# Patient Record
Sex: Female | Born: 1970 | Race: Black or African American | Hispanic: No | Smoking: Former smoker
Health system: Southern US, Community
[De-identification: ages and names within clinical notes are randomized; demographics above are authoritative.]

## PROBLEM LIST (undated history)

## (undated) DIAGNOSIS — J45909 Unspecified asthma, uncomplicated: Secondary | ICD-10-CM

## (undated) DIAGNOSIS — G545 Neuralgic amyotrophy: Secondary | ICD-10-CM

## (undated) DIAGNOSIS — S83207A Unspecified tear of unspecified meniscus, current injury, left knee, initial encounter: Secondary | ICD-10-CM

## (undated) DIAGNOSIS — M1990 Unspecified osteoarthritis, unspecified site: Secondary | ICD-10-CM

## (undated) DIAGNOSIS — N6320 Unspecified lump in the left breast, unspecified quadrant: Secondary | ICD-10-CM

## (undated) DIAGNOSIS — Z86718 Personal history of other venous thrombosis and embolism: Secondary | ICD-10-CM

## (undated) DIAGNOSIS — D649 Anemia, unspecified: Secondary | ICD-10-CM

## (undated) DIAGNOSIS — G43909 Migraine, unspecified, not intractable, without status migrainosus: Secondary | ICD-10-CM

## (undated) HISTORY — DX: Neuralgic amyotrophy: G54.5

## (undated) HISTORY — DX: Anemia, unspecified: D64.9

---

## 1999-08-15 HISTORY — PX: GASTRIC BYPASS: SHX52

## 1999-08-15 HISTORY — PX: LAPAROSCOPIC CHOLECYSTECTOMY: SUR755

## 2003-09-14 ENCOUNTER — Emergency Department (HOSPITAL_COMMUNITY): Admission: EM | Admit: 2003-09-14 | Discharge: 2003-09-14 | Payer: Self-pay | Admitting: Emergency Medicine

## 2005-02-28 ENCOUNTER — Emergency Department (HOSPITAL_COMMUNITY): Admission: EM | Admit: 2005-02-28 | Discharge: 2005-02-28 | Payer: Self-pay | Admitting: Emergency Medicine

## 2005-07-23 ENCOUNTER — Emergency Department (HOSPITAL_COMMUNITY): Admission: AD | Admit: 2005-07-23 | Discharge: 2005-07-23 | Payer: Self-pay | Admitting: Family Medicine

## 2005-08-14 HISTORY — PX: KNEE ARTHROSCOPY W/ MENISCECTOMY: SHX1879

## 2005-09-18 ENCOUNTER — Emergency Department (HOSPITAL_COMMUNITY): Admission: EM | Admit: 2005-09-18 | Discharge: 2005-09-18 | Payer: Self-pay | Admitting: Family Medicine

## 2005-10-09 ENCOUNTER — Emergency Department (HOSPITAL_COMMUNITY): Admission: EM | Admit: 2005-10-09 | Discharge: 2005-10-10 | Payer: Self-pay | Admitting: Emergency Medicine

## 2005-11-10 ENCOUNTER — Encounter: Admission: RE | Admit: 2005-11-10 | Discharge: 2005-11-10 | Payer: Self-pay | Admitting: Family Medicine

## 2006-05-11 ENCOUNTER — Emergency Department (HOSPITAL_COMMUNITY): Admission: EM | Admit: 2006-05-11 | Discharge: 2006-05-11 | Payer: Self-pay | Admitting: Family Medicine

## 2006-07-26 ENCOUNTER — Emergency Department (HOSPITAL_COMMUNITY): Admission: EM | Admit: 2006-07-26 | Discharge: 2006-07-26 | Payer: Self-pay | Admitting: Family Medicine

## 2006-08-14 HISTORY — PX: SHOULDER ARTHROSCOPY: SHX128

## 2006-09-03 ENCOUNTER — Encounter: Admission: RE | Admit: 2006-09-03 | Discharge: 2006-09-03 | Payer: Self-pay | Admitting: Occupational Medicine

## 2006-12-07 ENCOUNTER — Ambulatory Visit: Payer: Self-pay | Admitting: Oncology

## 2007-02-04 ENCOUNTER — Ambulatory Visit: Payer: Self-pay | Admitting: Oncology

## 2007-02-06 LAB — CBC WITH DIFFERENTIAL/PLATELET
Basophils Absolute: 0.1 10*3/uL (ref 0.0–0.1)
EOS%: 4.9 % (ref 0.0–7.0)
HGB: 8.8 g/dL — ABNORMAL LOW (ref 11.6–15.9)
MCH: 22.8 pg — ABNORMAL LOW (ref 26.0–34.0)
NEUT#: 2.6 10*3/uL (ref 1.5–6.5)
RBC: 3.85 10*6/uL (ref 3.70–5.32)
RDW: 19.1 % — ABNORMAL HIGH (ref 11.3–14.5)
lymph#: 2 10*3/uL (ref 0.9–3.3)

## 2007-02-06 LAB — MORPHOLOGY

## 2007-02-10 LAB — COMPREHENSIVE METABOLIC PANEL
Albumin: 4 g/dL (ref 3.5–5.2)
BUN: 8 mg/dL (ref 6–23)
CO2: 21 mEq/L (ref 19–32)
Calcium: 9.1 mg/dL (ref 8.4–10.5)
Chloride: 110 mEq/L (ref 96–112)
Glucose, Bld: 82 mg/dL (ref 70–99)
Potassium: 3.9 mEq/L (ref 3.5–5.3)
Total Protein: 7.1 g/dL (ref 6.0–8.3)

## 2007-02-10 LAB — APTT: aPTT: 30 seconds (ref 24–37)

## 2007-02-10 LAB — ANA: Anti Nuclear Antibody(ANA): NEGATIVE

## 2007-02-10 LAB — PROTEIN C ACTIVITY: Protein C Activity: 69 % — ABNORMAL LOW (ref 91–147)

## 2007-02-10 LAB — LACTATE DEHYDROGENASE: LDH: 193 U/L (ref 94–250)

## 2007-02-10 LAB — PROTEIN S, ANTIGEN, FREE: Protein S Ag, Free: 53 % normal (ref 50–147)

## 2007-02-10 LAB — PROTEIN C, TOTAL: Protein C, Total: 77 % (ref 70–140)

## 2007-02-10 LAB — PROTHROMBIN TIME: Prothrombin Time: 13.5 seconds (ref 11.6–15.2)

## 2007-02-27 ENCOUNTER — Emergency Department (HOSPITAL_COMMUNITY): Admission: EM | Admit: 2007-02-27 | Discharge: 2007-02-27 | Payer: Self-pay | Admitting: Emergency Medicine

## 2007-05-15 ENCOUNTER — Ambulatory Visit: Payer: Self-pay | Admitting: Oncology

## 2007-11-28 ENCOUNTER — Emergency Department (HOSPITAL_COMMUNITY): Admission: EM | Admit: 2007-11-28 | Discharge: 2007-11-28 | Payer: Self-pay | Admitting: Emergency Medicine

## 2013-02-24 ENCOUNTER — Encounter (HOSPITAL_COMMUNITY): Payer: Self-pay | Admitting: Emergency Medicine

## 2013-02-24 ENCOUNTER — Emergency Department (HOSPITAL_COMMUNITY)
Admission: EM | Admit: 2013-02-24 | Discharge: 2013-02-24 | Disposition: A | Discharge status: Home / Self Care | Payer: Medicaid - Out of State | Attending: Emergency Medicine | Admitting: Emergency Medicine

## 2013-02-24 ENCOUNTER — Emergency Department (HOSPITAL_COMMUNITY)
Admission: EM | Admit: 2013-02-24 | Discharge: 2013-02-24 | Disposition: A | Discharge status: Home / Self Care | Payer: Self-pay | Source: Home / Self Care

## 2013-02-24 ENCOUNTER — Ambulatory Visit (HOSPITAL_COMMUNITY)
Admit: 2013-02-24 | Discharge: 2013-02-24 | Disposition: A | Discharge status: Home / Self Care | Payer: Medicaid - Out of State | Attending: Emergency Medicine | Admitting: Emergency Medicine

## 2013-02-24 DIAGNOSIS — J45909 Unspecified asthma, uncomplicated: Secondary | ICD-10-CM | POA: Insufficient documentation

## 2013-02-24 DIAGNOSIS — M79609 Pain in unspecified limb: Secondary | ICD-10-CM

## 2013-02-24 DIAGNOSIS — M79662 Pain in left lower leg: Secondary | ICD-10-CM

## 2013-02-24 DIAGNOSIS — Z8679 Personal history of other diseases of the circulatory system: Secondary | ICD-10-CM | POA: Insufficient documentation

## 2013-02-24 DIAGNOSIS — R252 Cramp and spasm: Secondary | ICD-10-CM | POA: Insufficient documentation

## 2013-02-24 HISTORY — DX: Migraine, unspecified, not intractable, without status migrainosus: G43.909

## 2013-02-24 LAB — POCT I-STAT, CHEM 8
BUN: 13 mg/dL (ref 6–23)
Chloride: 108 mEq/L (ref 96–112)
HCT: 35 % — ABNORMAL LOW (ref 36.0–46.0)
Potassium: 3.9 mEq/L (ref 3.5–5.1)
Sodium: 141 mEq/L (ref 135–145)

## 2013-02-24 LAB — CBC
Platelets: 215 10*3/uL (ref 150–400)
RBC: 4.11 MIL/uL (ref 3.87–5.11)
RDW: 13.9 % (ref 11.5–15.5)
WBC: 6.2 10*3/uL (ref 4.0–10.5)

## 2013-02-24 MED ORDER — HYDROCODONE-ACETAMINOPHEN 5-325 MG PO TABS
2.0000 | ORAL_TABLET | Freq: Once | ORAL | Status: AC
  Administered 2013-02-24: 2 via ORAL
  Filled 2013-02-24: qty 2

## 2013-02-24 MED ORDER — ENOXAPARIN SODIUM 100 MG/ML ~~LOC~~ SOLN
1.0000 mg/kg | Freq: Once | SUBCUTANEOUS | Status: AC
Start: 2013-02-24 — End: 2013-02-24
  Administered 2013-02-24: 95 mg via SUBCUTANEOUS
  Filled 2013-02-24: qty 1

## 2013-02-24 NOTE — ED Notes (Signed)
Per Pt: Patient c/o of moderate severe cramps in her LLE. States cramps have been occurring intermittently since Friday and have been increasing in severity. Ax4, NAD. Pt is able to ambulate.

## 2013-02-24 NOTE — ED Provider Notes (Signed)
History    CSN: [id number] Arrival date & time 02/24/13  0516  None    Chief Complaint  Patient presents with  . Leg Pain   (Consider location/radiation/quality/duration/timing/severity/associated sxs/prior Treatment) HPI History provided by patient. Left calf cramping ongoing for the last 3 days. No trauma. She has noticed some mild swelling. She denies history of same. She has a history of asthma but denies any recent use of albuterol. No other medications. No fevers. No rash. No known aggravating or alleviating factors. Symptoms moderate in severity. No chest pain or shortness of breath.  Past Medical History  Diagnosis Date  . Asthma   . Migraines    History reviewed. No pertinent past surgical history. No family history on file. History  Substance Use Topics  . Smoking status: Not on file  . Smokeless tobacco: Not on file  . Alcohol Use: Not on file   OB History   Grav Para Term Preterm Abortions TAB SAB Ect Mult Living                 Review of Systems  Constitutional: Negative for fever and chills.  HENT: Negative for neck pain.   Eyes: Negative for visual disturbance.  Respiratory: Negative for shortness of breath.   Cardiovascular: Negative for chest pain.  Gastrointestinal: Negative for abdominal pain.  Genitourinary: Negative for flank pain.  Musculoskeletal: Negative for back pain.  Skin: Negative for rash.  Neurological: Negative for headaches.  All other systems reviewed and are negative.    Allergies  Review of patient's allergies indicates no known allergies.  Home Medications  No current outpatient prescriptions on file. BP 110/76  Pulse 66  Temp(Src) 98.3 F (36.8 C) (Oral)  Resp 18  Ht 5\' 4"  (1.626 m)  Wt 205 lb (92.987 kg)  BMI 35.17 kg/m2  SpO2 100% Physical Exam  Constitutional: She is oriented to person, place, and time. She appears well-developed and well-nourished.  HENT:  Head: Normocephalic and atraumatic.  Eyes: EOM are  normal. Pupils are equal, round, and reactive to light.  Neck: Neck supple.  Cardiovascular: Normal rate, regular rhythm and intact distal pulses.   Pulmonary/Chest: Effort normal and breath sounds normal. No respiratory distress.  Musculoskeletal: Normal range of motion.  Tender over left calf with mild edema. Distal neurovascular intact. No cords. Negative Homans sign.no right lower extremity tenderness or swelling  Neurological: She is alert and oriented to person, place, and time.  Skin: Skin is warm and dry.    ED Course  Procedures (including critical care time)  Results for orders placed during the hospital encounter of 02/24/13  CBC      Result Value Range   WBC 6.2  4.0 - 10.5 K/uL   RBC 4.11  3.87 - 5.11 MIL/uL   Hemoglobin 11.1 (*) 12.0 - 15.0 g/dL   HCT 40.9 (*) 81.1 - 91.4 %   MCV 80.0  78.0 - 100.0 fL   MCH 27.0  26.0 - 34.0 pg   MCHC 33.7  30.0 - 36.0 g/dL   RDW 78.2  95.6 - 21.3 %   Platelets 215  150 - 400 K/uL  POCT I-STAT, CHEM 8      Result Value Range   Sodium 141  135 - 145 mEq/L   Potassium 3.9  3.5 - 5.1 mEq/L   Chloride 108  96 - 112 mEq/L   BUN 13  6 - 23 mg/dL   Creatinine, Ser 0.86 (*) 0.50 - 1.10 mg/dL  Glucose, Bld 114 (*) 70 - 99 mg/dL   Calcium, Ion 1.61  0.96 - 1.23 mmol/L   TCO2 19  0 - 100 mmol/L   Hemoglobin 11.9 (*) 12.0 - 15.0 g/dL   HCT 04.5 (*) 40.9 - 81.1 %    vicodin PO  Plan Lovenox and discharge home with followup ultrasound in vascular lab. Return precautions provided and verbalizes understood. Outpatient referral provided. Stable for discharge home at this time in improved condition   MDM  Left leg cramps - no trauma and no PE symptoms Labs reviewed as above potassium 3.9 and creatinine 1.2 Medications provided Vital signs and nursing notes reviewed and considered  Sunnie Nielsen, MD 02/24/13 [phone]

## 2013-02-24 NOTE — Progress Notes (Signed)
*  PRELIMINARY RESULTS* Vascular Ultrasound Left lower extremity venous duplex has been completed.  Preliminary findings: Left = negative for DVT.    Farrel Demark, RDMS, RVT  02/24/2013, 9:15 AM

## 2013-03-15 ENCOUNTER — Emergency Department (HOSPITAL_COMMUNITY): Payer: Medicaid - Out of State

## 2013-03-15 ENCOUNTER — Encounter (HOSPITAL_COMMUNITY): Payer: Self-pay | Admitting: Family Medicine

## 2013-03-15 ENCOUNTER — Emergency Department (HOSPITAL_COMMUNITY)
Admission: EM | Admit: 2013-03-15 | Discharge: 2013-03-15 | Disposition: A | Discharge status: Home / Self Care | Payer: Medicaid - Out of State | Attending: Emergency Medicine | Admitting: Emergency Medicine

## 2013-03-15 DIAGNOSIS — G43909 Migraine, unspecified, not intractable, without status migrainosus: Secondary | ICD-10-CM | POA: Insufficient documentation

## 2013-03-15 DIAGNOSIS — M791 Myalgia, unspecified site: Secondary | ICD-10-CM

## 2013-03-15 DIAGNOSIS — M25512 Pain in left shoulder: Secondary | ICD-10-CM

## 2013-03-15 DIAGNOSIS — Z8739 Personal history of other diseases of the musculoskeletal system and connective tissue: Secondary | ICD-10-CM | POA: Insufficient documentation

## 2013-03-15 DIAGNOSIS — M25519 Pain in unspecified shoulder: Secondary | ICD-10-CM | POA: Insufficient documentation

## 2013-03-15 DIAGNOSIS — Z79899 Other long term (current) drug therapy: Secondary | ICD-10-CM | POA: Insufficient documentation

## 2013-03-15 DIAGNOSIS — IMO0001 Reserved for inherently not codable concepts without codable children: Secondary | ICD-10-CM | POA: Insufficient documentation

## 2013-03-15 DIAGNOSIS — R259 Unspecified abnormal involuntary movements: Secondary | ICD-10-CM | POA: Insufficient documentation

## 2013-03-15 DIAGNOSIS — J45909 Unspecified asthma, uncomplicated: Secondary | ICD-10-CM | POA: Insufficient documentation

## 2013-03-15 DIAGNOSIS — F17200 Nicotine dependence, unspecified, uncomplicated: Secondary | ICD-10-CM | POA: Insufficient documentation

## 2013-03-15 MED ORDER — HYDROCODONE-ACETAMINOPHEN 5-325 MG PO TABS: 1.0000 | ORAL_TABLET | Freq: Four times a day (QID) | ORAL | Status: DC | PRN

## 2013-03-15 MED ORDER — CYCLOBENZAPRINE HCL 10 MG PO TABS: 10.0000 mg | ORAL_TABLET | Freq: Two times a day (BID) | ORAL | Status: DC | PRN

## 2013-03-15 NOTE — ED Notes (Signed)
Pt c/o constant, sharp, intense left arm pain x29mo with new onset of spasms x1day. Pt states she has tried multiple pain relievers throughout the past month with no relief.

## 2013-03-15 NOTE — ED Notes (Signed)
Pt returned from X-ray.  

## 2013-03-15 NOTE — ED Provider Notes (Signed)
CSN: [id number]     Arrival date & time 03/15/13  0220 History     First MD Initiated Contact with Patient 03/15/13 0244     Chief Complaint  Patient presents with  . Arm Pain   HPI Yesenia Wheeler is a 42 y.o. female presenting with left shoulder and upper arm pain x1 month. She's had associated spasms x1 day, she's tried multiple over-the-counter pain relievers without relief, she has not slept very well this evening. Patient's pain is severe, nonradiating, sharp, associated with occasional spasms. She denies, denies any numbness or tingling of the left hand, no recent trauma.   Past Medical History  Diagnosis Date  . Asthma   . Migraines   . Bone spur 2008   Past Surgical History  Procedure Laterality Date  . Shoulder surgery Left 2008    Bone Spur removal   No family history on file. History  Substance Use Topics  . Smoking status: Current Some Day Smoker  . Smokeless tobacco: Never Used  . Alcohol Use: No   OB History   Grav Para Term Preterm Abortions TAB SAB Ect Mult Living   13    13  13   3      Review of Systems At least 10pt or greater review of systems completed and are negative except where specified in the HPI.  Allergies  Review of patient's allergies indicates no known allergies.  Home Medications   Current Outpatient Rx  Name  Route  Sig  Dispense  Refill  . SUMAtriptan Succinate (IMITREX PO)   Oral   Take 1 tablet by mouth daily as needed (migraine).         . TOPIRAMATE PO   Oral   Take 1 tablet by mouth daily.         . cyclobenzaprine (FLEXERIL) 10 MG tablet   Oral   Take 1 tablet (10 mg total) by mouth 2 (two) times daily as needed for muscle spasms.   20 tablet   0   . HYDROcodone-acetaminophen (NORCO/VICODIN) 5-325 MG per tablet   Oral   Take 1-2 tablets by mouth every 6 (six) hours as needed for pain.   17 tablet   0    BP 118/75  Pulse 65  Temp(Src) 97.3 F (36.3 C) (Oral)  Resp 16  SpO2 100%  LMP  03/02/2013 Physical Exam  Nursing notes reviewed.  Electronic medical record reviewed. VITAL SIGNS:   Filed Vitals:   03/15/13 0241 03/15/13 0406  BP: 101/68 118/75  Pulse: 69 65  Temp: 97.3 F (36.3 C)   TempSrc: Oral   Resp: 18 16  SpO2: 100% 100%   CONSTITUTIONAL: Awake, oriented, appears non-toxic HENT: Atraumatic, normocephalic, oral mucosa pink and moist, airway patent. Nares patent without drainage. External ears normal. EYES: Conjunctiva clear, EOMI, PERRLA NECK: Trachea midline, non-tender, supple CARDIOVASCULAR: Normal heart rate, Normal rhythm, No murmurs, rubs, gallops PULMONARY/CHEST: Clear to auscultation, no rhonchi, wheezes, or rales. Symmetrical breath sounds. Non-tender. ABDOMINAL: Non-distended, soft, non-tender - no rebound or guarding.  BS normal. BACK: No step-offs or deformities, nontender to palpation in the midline, no skin abnormalities. Patient does have some tenderness when the muscle bellies of the trapezius and deltoid are palpated. NEUROLOGIC: Non-focal, moving all four extremities, no gross sensory or motor deficits. EXTREMITIES: No clubbing, cyanosis, or edema SKIN: Warm, Dry, No erythema, No rash  ED Course   Procedures (including critical care time)  Labs Reviewed - No data to display Dg Elbow  Complete Left  03/15/2013   *RADIOLOGY REPORT*  Clinical Data: Prior pain  LEFT ELBOW - COMPLETE 3+ VIEW  Comparison: None.  Findings: There is no acute fracture dislocation.  No joint effusion.  Minimal degenerative spurring is present at the olecranon.  No soft tissue abnormality.  Osseous mineralization is normal.  IMPRESSION: Prior minimal degenerative spurring about the olecranon.  No acute fracture or dislocation.   Original Report Authenticated By: Rise Mu, M.D.   Dg Shoulder Left  03/15/2013   *RADIOLOGY REPORT*  Clinical Data: Left shoulder pain  LEFT SHOULDER - 2+ VIEW  Comparison: None.  Findings: There is no acute fracture or  dislocation.  The humeral head articulates normally with the glenoid.  AC joint is grossly approximated.  Osseous mineralization is normal.  No soft tissue abnormality.  Partially visualized left hemithorax is grossly unremarkable.  IMPRESSION: Normal radiograph of the left shoulder.   Original Report Authenticated By: Rise Mu, M.D.   1. Muscle pain   2. Shoulder pain, left     MDM  Triage x-rays were unremarkable. Patient has muscular pain, doubt radiculopathy given clinical history and exam. Shoulders not red, swollen, do not suspect a joint infection, necrotizing fasciitis orthopedic emergency at this time. Refer the patient to sports medicine, given pain medicine and discharged home stable and good condition.   Jones Skene, MD 03/15/13 [phone]

## 2013-05-23 ENCOUNTER — Emergency Department (HOSPITAL_COMMUNITY)
Admission: EM | Admit: 2013-05-23 | Discharge: 2013-05-23 | Disposition: A | Discharge status: Home / Self Care | Payer: Medicaid - Out of State | Attending: Emergency Medicine | Admitting: Emergency Medicine

## 2013-05-23 ENCOUNTER — Emergency Department (HOSPITAL_COMMUNITY): Payer: Medicaid - Out of State

## 2013-05-23 ENCOUNTER — Encounter (HOSPITAL_COMMUNITY): Payer: Self-pay | Admitting: Emergency Medicine

## 2013-05-23 DIAGNOSIS — F17200 Nicotine dependence, unspecified, uncomplicated: Secondary | ICD-10-CM | POA: Insufficient documentation

## 2013-05-23 DIAGNOSIS — R072 Precordial pain: Secondary | ICD-10-CM | POA: Insufficient documentation

## 2013-05-23 DIAGNOSIS — Z79899 Other long term (current) drug therapy: Secondary | ICD-10-CM | POA: Insufficient documentation

## 2013-05-23 DIAGNOSIS — Z8739 Personal history of other diseases of the musculoskeletal system and connective tissue: Secondary | ICD-10-CM | POA: Insufficient documentation

## 2013-05-23 DIAGNOSIS — G43909 Migraine, unspecified, not intractable, without status migrainosus: Secondary | ICD-10-CM | POA: Insufficient documentation

## 2013-05-23 DIAGNOSIS — R112 Nausea with vomiting, unspecified: Secondary | ICD-10-CM | POA: Insufficient documentation

## 2013-05-23 DIAGNOSIS — J45909 Unspecified asthma, uncomplicated: Secondary | ICD-10-CM | POA: Insufficient documentation

## 2013-05-23 DIAGNOSIS — E669 Obesity, unspecified: Secondary | ICD-10-CM | POA: Insufficient documentation

## 2013-05-23 DIAGNOSIS — Z3202 Encounter for pregnancy test, result negative: Secondary | ICD-10-CM | POA: Insufficient documentation

## 2013-05-23 DIAGNOSIS — R079 Chest pain, unspecified: Secondary | ICD-10-CM

## 2013-05-23 LAB — BASIC METABOLIC PANEL
BUN: 8 mg/dL (ref 6–23)
CO2: 24 mEq/L (ref 19–32)
Chloride: 106 mEq/L (ref 96–112)
GFR calc Af Amer: 90 mL/min — ABNORMAL LOW (ref >90)
Glucose, Bld: 72 mg/dL (ref 70–99)
Potassium: 3.4 mEq/L — ABNORMAL LOW (ref 3.5–5.1)

## 2013-05-23 LAB — CBC
HCT: 32.1 % — ABNORMAL LOW (ref 36.0–46.0)
Hemoglobin: 10.4 g/dL — ABNORMAL LOW (ref 12.0–15.0)
MCV: 82.5 fL (ref 78.0–100.0)
RBC: 3.89 MIL/uL (ref 3.87–5.11)
RDW: 14.5 % (ref 11.5–15.5)
WBC: 5.4 10*3/uL (ref 4.0–10.5)

## 2013-05-23 MED ORDER — IOHEXOL 350 MG/ML SOLN
100.0000 mL | Freq: Once | INTRAVENOUS | Status: AC | PRN
  Administered 2013-05-23: 100 mL via INTRAVENOUS

## 2013-05-23 MED ORDER — KETOROLAC TROMETHAMINE 30 MG/ML IJ SOLN
30.0000 mg | Freq: Once | INTRAMUSCULAR | Status: AC
  Administered 2013-05-23: 30 mg via INTRAVENOUS
  Filled 2013-05-23: qty 1

## 2013-05-23 MED ORDER — TRAMADOL HCL 50 MG PO TABS: 50.0000 mg | ORAL_TABLET | Freq: Four times a day (QID) | ORAL | Status: DC | PRN

## 2013-05-23 NOTE — ED Notes (Signed)
PA at bedside attempting IV access.  

## 2013-05-23 NOTE — ED Notes (Signed)
PA not able to obtain access.

## 2013-05-23 NOTE — ED Notes (Signed)
Charge unable to get IV via [place]. IV team paged.

## 2013-05-23 NOTE — Progress Notes (Signed)
[facility] provided pt with a list of primary care resources and a [facility] [place] [facility] application.  °

## 2013-05-23 NOTE — ED Notes (Signed)
Xray tech tried to take pt for chest xray, pt states to her that she hasnt had menstrual cycle in long time and would like a pregnancy test before she goes to radiology. Made [facility] EDP aware.

## 2013-05-23 NOTE — ED Notes (Signed)
Patient c/o pain at IV site.  Flushed IV and patient grimacing and saying it hurts.  MD notified that Toradol unable to be given IV.

## 2013-05-23 NOTE — ED Provider Notes (Signed)
CSN: [id number]     Arrival date & time 05/23/13  1213 History   First MD Initiated Contact with Patient 05/23/13 1234     Chief Complaint  Patient presents with  . Chest Pain  . Emesis   (Consider location/radiation/quality/duration/timing/severity/associated sxs/prior Treatment) Patient is a 42 y.o. female presenting with chest pain.  Chest Pain Pain location:  Substernal area Pain quality: sharp   Pain radiates to:  Does not radiate Pain severity:  Severe Onset quality:  Gradual Duration:  1 week Timing:  Constant Progression:  Unchanged Chronicity:  New Relieved by:  Nothing Worsened by:  Deep breathing and movement (palpation) Ineffective treatments:  None tried Associated symptoms: nausea   Associated symptoms: no abdominal pain, no cough, no diaphoresis, no dizziness, no fever, no shortness of breath and not vomiting     Past Medical History  Diagnosis Date  . Asthma   . Migraines   . Bone spur 2008   Past Surgical History  Procedure Laterality Date  . Shoulder surgery Left 2008    Bone Spur removal   No family history on file. History  Substance Use Topics  . Smoking status: Current Some Day Smoker  . Smokeless tobacco: Never Used  . Alcohol Use: No   OB History   Grav Para Term Preterm Abortions TAB SAB Ect Mult Living   13    13  13   3      Review of Systems  Constitutional: Negative for fever and diaphoresis.  HENT: Negative for congestion.   Respiratory: Negative for cough and shortness of breath.   Cardiovascular: Positive for chest pain.  Gastrointestinal: Positive for nausea. Negative for vomiting, abdominal pain and diarrhea.  Neurological: Negative for dizziness.  All other systems reviewed and are negative.    Allergies  Review of patient's allergies indicates no known allergies.  Home Medications   Current Outpatient Rx  Name  Route  Sig  Dispense  Refill  . albuterol (PROVENTIL HFA;VENTOLIN HFA) 108 (90 BASE) MCG/ACT inhaler  Inhalation   Inhale 2 puffs into the lungs every 6 (six) hours as needed for wheezing.         . SUMAtriptan (IMITREX) 100 MG tablet   Oral   Take 100 mg by mouth every 2 (two) hours as needed for migraine (maximum 2 tablets peer 24 hours). May repeat in 2 hours if headache persists or recurs.         . topiramate (TOPAMAX) 25 MG tablet   Oral   Take 25 mg by mouth 2 (two) times daily.          BP 108/60  Pulse 71  Temp(Src) 99.1 F (37.3 C) (Oral)  Resp 21  SpO2 100%  LMP 04/18/2013 Physical Exam  Nursing note and vitals reviewed. Constitutional: She is oriented to person, place, and time. She appears well-developed and well-nourished. No distress.  obese  HENT:  Head: Normocephalic and atraumatic.  Mouth/Throat: Oropharynx is clear and moist.  Eyes: Conjunctivae are normal. Pupils are equal, round, and reactive to light. No scleral icterus.  Neck: Neck supple.  Cardiovascular: Normal rate, regular rhythm, normal heart sounds and intact distal pulses.   No murmur heard. Pulmonary/Chest: Effort normal and breath sounds normal. No stridor. No respiratory distress. She has no rales. She exhibits tenderness.    Abdominal: Soft. Bowel sounds are normal. She exhibits no distension. There is no tenderness.  Musculoskeletal: Normal range of motion.  Neurological: She is alert and oriented to person, place,  and time.  Skin: Skin is warm and dry. No rash noted.  Psychiatric: She has a normal mood and affect. Her behavior is normal.    ED Course  Procedures (including critical care time) Labs Review Labs Reviewed  CBC - Abnormal; Notable for the following:    Hemoglobin 10.4 (*)    HCT 32.1 (*)    All other components within normal limits  BASIC METABOLIC PANEL - Abnormal; Notable for the following:    Potassium 3.4 (*)    GFR calc non Af Amer 77 (*)    GFR calc Af Amer 90 (*)    All other components within normal limits  POCT I-STAT TROPONIN I   Imaging Review No  results found.  EKG Interpretation   None     EKG - NSR, rate75, normal axis, normal intervals, no ST/T changes, no priors available.    MDM   1. Chest pain    42 yo female with chest pain for 1 week. Sternal and sharp.  Worse with movement.  Reproduced with palpation.  No dizziness, SOB, or diaphoresis.  EKG normal.  Story inconsistent with ACS and she has had persistent pain for a week with negative troponin.  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  D Dimer elevated.  CTA ordered.  Toradol ordered for pain.  Dr. Effie Shy will follow up result of CTA.  Anticipate dc home with [facility] chest pain treatment if negative.      Candyce Churn, MD 05/23/13 1754

## 2013-05-23 NOTE — ED Notes (Signed)
Dr. Effie Shy notified that patient does not have IV access.  IV team unable to establish line.

## 2013-05-23 NOTE — ED Notes (Signed)
Pt presents with centralized chest pain that began one week ago. Pt reports a history of chest pain, which was evaluated as a chest wall strain. Pt also reports dizziness, nausea, emesis, weakness, and metallic taste in her mouth. Pt is A/O x4, talking in full sentences, oxygen saturation of 100% on room air.

## 2013-05-23 NOTE — ED Notes (Signed)
[place] attempted to start line using ultrasound.  Unsuccessful attempt. IV team notified.

## 2013-05-23 NOTE — ED Notes (Signed)
Attempted IV start x 2.  Not successful.

## 2013-05-23 NOTE — ED Notes (Signed)
Patient transported to X-ray 

## 2013-05-23 NOTE — ED Provider Notes (Signed)
Patient received in checkout from Dr. Loretha Stapler. Delay in character. Pain. IV access. Prophylaxis was ultimately obtained by nursing. CT chest done, and is negative for acute abnormality including infection, and PE. Repeat vital signs are normal.  Dg Chest 2 View  05/23/2013   CLINICAL DATA:  Chest pain.  EXAM: CHEST  2 VIEW  COMPARISON:  September 05, 2006.  FINDINGS: The heart size and mediastinal contours are within normal limits. Both lungs are clear. The visualized skeletal structures are unremarkable.  IMPRESSION: No active cardiopulmonary disease.   Electronically Signed   By: Roque Lias M.D.   On: 05/23/2013 19:03   Ct Angio Chest Pe W/cm &/or Wo Cm  05/23/2013   CLINICAL DATA:  Chest pain and shortness of breath. Elevated D-dimer.  EXAM: CT ANGIOGRAPHY CHEST WITH CONTRAST  TECHNIQUE: Multidetector CT imaging of the chest was performed using the standard protocol during bolus administration of intravenous contrast. Multiplanar CT image reconstructions including MIPs were obtained to evaluate the vascular anatomy.  CONTRAST:  OMNIPAQUE IOHEXOL 350 MG/ML SOLN  COMPARISON:  Chest radiographs obtained earlier today. Chest CTA report dated 02/14/2010.  FINDINGS: Normally opacified pulmonary arteries. No pulmonary arterial filling defects. Clear lungs. No lung masses or enlarged lymph nodes. Mild thoracic spine degenerative changes. Surgical clips and staples in the upper abdomen with an appearance suggesting previous gastric bypass.  Review of the MIP images confirms the above findings.  IMPRESSION: No pulmonary emboli or acute abnormality.   Electronically Signed   By: Gordan Payment M.D.   On: 05/23/2013 19:39   Medications  iohexol (OMNIPAQUE) 350 MG/ML injection 100 mL (100 mLs Intravenous Contrast Given 05/23/13 1913)  ketorolac (TORADOL) 30 MG/ML injection 30 mg (30 mg Intravenous Given 05/23/13 1641)    Patient Vitals for the past 24 hrs:  BP Temp Temp src Pulse Resp SpO2  05/23/13 1730  104/70 mmHg - - 72 16 100 %  05/23/13 1712 116/66 mmHg 98.7 F (37.1 C) Oral 66 15 100 %  05/23/13 1630 128/78 mmHg - - 57 16 100 %  05/23/13 1616 108/69 mmHg - - 60 20 100 %  05/23/13 1400 - - - 59 16 100 %  05/23/13 1300 - - - 66 18 100 %  05/23/13 1220 108/60 mmHg 99.1 F (37.3 C) Oral 71 21 100 %    8:15 PM Reevaluation with update and discussion. After initial assessment and treatment, an updated evaluation reveals she is comfortable. She would like some pain medicine to use at home. She does not have a PCP. [name] L    Nursing Notes Reviewed/ Care Coordinated Applicable Imaging Reviewed Interpretation of Laboratory Data incorporated into ED treatment  Plan: Home Medications- Tramadol; Home Treatments- rest; return here if the recommended treatment, does not improve the symptoms; Recommended follow up- PCP of choice prn  Flint Melter, MD 05/23/13 2019

## 2013-05-26 LAB — POCT PREGNANCY, URINE: Preg Test, Ur: NEGATIVE

## 2013-09-08 ENCOUNTER — Emergency Department (HOSPITAL_COMMUNITY)
Admission: EM | Admit: 2013-09-08 | Discharge: 2013-09-08 | Discharge status: Against Medical Advice | Payer: Medicaid - Out of State | Attending: Emergency Medicine | Admitting: Emergency Medicine

## 2013-09-08 ENCOUNTER — Encounter (HOSPITAL_COMMUNITY): Payer: Self-pay | Admitting: Emergency Medicine

## 2013-09-08 DIAGNOSIS — F17200 Nicotine dependence, unspecified, uncomplicated: Secondary | ICD-10-CM | POA: Insufficient documentation

## 2013-09-08 DIAGNOSIS — J45909 Unspecified asthma, uncomplicated: Secondary | ICD-10-CM | POA: Insufficient documentation

## 2013-09-08 DIAGNOSIS — G43909 Migraine, unspecified, not intractable, without status migrainosus: Secondary | ICD-10-CM | POA: Insufficient documentation

## 2013-09-08 LAB — CBC
HCT: 32.9 % — ABNORMAL LOW (ref 36.0–46.0)
Hemoglobin: 11.1 g/dL — ABNORMAL LOW (ref 12.0–15.0)
MCH: 27.1 pg (ref 26.0–34.0)
MCHC: 33.7 g/dL (ref 30.0–36.0)
MCV: 80.4 fL (ref 78.0–100.0)
Platelets: 214 10*3/uL (ref 150–400)
RBC: 4.09 MIL/uL (ref 3.87–5.11)
RDW: 15.1 % (ref 11.5–15.5)
WBC: 6.6 10*3/uL (ref 4.0–10.5)

## 2013-09-08 LAB — POCT I-STAT, CHEM 8
BUN: 15 mg/dL (ref 6–23)
CREATININE: 1.1 mg/dL (ref 0.50–1.10)
Calcium, Ion: 1.18 mmol/L (ref 1.12–1.23)
Chloride: 107 mEq/L (ref 96–112)
GLUCOSE: 80 mg/dL (ref 70–99)
HCT: 36 % (ref 36.0–46.0)
Hemoglobin: 12.2 g/dL (ref 12.0–15.0)
Potassium: 4.2 mEq/L (ref 3.7–5.3)
SODIUM: 141 meq/L (ref 137–147)
TCO2: 22 mmol/L (ref 0–100)

## 2013-09-08 NOTE — ED Notes (Addendum)
Patient with migraine for last three weeks.  Patient denies any nausea or vomiting with the migraine.  Patient states that she does have some photophobia with it.  Patient has not had her period since beginning of December.

## 2013-09-08 NOTE — ED Notes (Signed)
Pt requesting to be discharged from system.  Stating "I cannot wait any longer".  This RN attempted to get pt to stay but pt not wanting to due to wait time.

## 2013-09-09 ENCOUNTER — Encounter (HOSPITAL_COMMUNITY): Payer: Self-pay | Admitting: Emergency Medicine

## 2013-09-09 ENCOUNTER — Emergency Department (HOSPITAL_COMMUNITY)
Admission: EM | Admit: 2013-09-09 | Discharge: 2013-09-09 | Disposition: A | Discharge status: Home / Self Care | Payer: Medicaid - Out of State | Attending: Emergency Medicine | Admitting: Emergency Medicine

## 2013-09-09 DIAGNOSIS — J45909 Unspecified asthma, uncomplicated: Secondary | ICD-10-CM | POA: Insufficient documentation

## 2013-09-09 DIAGNOSIS — G43909 Migraine, unspecified, not intractable, without status migrainosus: Secondary | ICD-10-CM | POA: Insufficient documentation

## 2013-09-09 DIAGNOSIS — Z79899 Other long term (current) drug therapy: Secondary | ICD-10-CM | POA: Insufficient documentation

## 2013-09-09 DIAGNOSIS — Z3202 Encounter for pregnancy test, result negative: Secondary | ICD-10-CM | POA: Insufficient documentation

## 2013-09-09 DIAGNOSIS — F17200 Nicotine dependence, unspecified, uncomplicated: Secondary | ICD-10-CM | POA: Insufficient documentation

## 2013-09-09 DIAGNOSIS — M542 Cervicalgia: Secondary | ICD-10-CM | POA: Insufficient documentation

## 2013-09-09 LAB — POCT PREGNANCY, URINE: Preg Test, Ur: NEGATIVE

## 2013-09-09 MED ORDER — PROCHLORPERAZINE EDISYLATE 5 MG/ML IJ SOLN: 10.0000 mg | Freq: Four times a day (QID) | INTRAMUSCULAR | Status: DC | PRN

## 2013-09-09 MED ORDER — PROCHLORPERAZINE EDISYLATE 5 MG/ML IJ SOLN
10.0000 mg | Freq: Once | INTRAMUSCULAR | Status: AC
  Administered 2013-09-09: 10 mg via INTRAVENOUS
  Filled 2013-09-09: qty 2

## 2013-09-09 MED ORDER — DEXAMETHASONE SODIUM PHOSPHATE 10 MG/ML IJ SOLN
10.0000 mg | Freq: Once | INTRAMUSCULAR | Status: AC
  Administered 2013-09-09: 10 mg via INTRAVENOUS
  Filled 2013-09-09: qty 1

## 2013-09-09 MED ORDER — DIPHENHYDRAMINE HCL 50 MG/ML IJ SOLN
50.0000 mg | Freq: Once | INTRAMUSCULAR | Status: AC
  Administered 2013-09-09: 50 mg via INTRAVENOUS
  Filled 2013-09-09: qty 1

## 2013-09-09 MED ORDER — SODIUM CHLORIDE 0.9 % IV BOLUS (SEPSIS)
1000.0000 mL | Freq: Once | INTRAVENOUS | Status: AC
  Administered 2013-09-09: 1000 mL via INTRAVENOUS

## 2013-09-09 NOTE — ED Notes (Signed)
Pt complains of migraine with vision issues for three weeks, pt is seveal weeks late for menstrual cycle, pt here last night and left [facility] due to wait times.

## 2013-09-09 NOTE — Discharge Instructions (Signed)
Migraine Headache A migraine headache is an intense, throbbing pain on one or both sides of your head. A migraine can last for 30 minutes to several hours. CAUSES  The exact cause of a migraine headache is not always known. However, a migraine may be caused when nerves in the brain become irritated and release chemicals that cause inflammation. This causes pain. Certain things may also trigger migraines, such as:  Alcohol.  Smoking.  Stress.  Menstruation.  Aged cheeses.  Foods or drinks that contain nitrates, glutamate, aspartame, or tyramine.  Lack of sleep.  Chocolate.  Caffeine.  Hunger.  Physical exertion.  Fatigue.  Medicines used to treat chest pain (nitroglycerine), birth control pills, estrogen, and some blood pressure medicines. SIGNS AND SYMPTOMS  Pain on one or both sides of your head.  Pulsating or throbbing pain.  Severe pain that prevents daily activities.  Pain that is aggravated by any physical activity.  Nausea, vomiting, or both.  Dizziness.  Pain with exposure to bright lights, loud noises, or activity.  General sensitivity to bright lights, loud noises, or smells. Before you get a migraine, you may get warning signs that a migraine is coming (aura). An aura may include:  Seeing flashing lights.  Seeing bright spots, halos, or zig-zag lines.  Having tunnel vision or blurred vision.  Having feelings of numbness or tingling.  Having trouble talking.  Having muscle weakness. DIAGNOSIS  A migraine headache is often diagnosed based on:  Symptoms.  Physical exam.  A CT scan or MRI of your head. These imaging tests cannot diagnose migraines, but they can help rule out other causes of headaches. TREATMENT Medicines may be given for pain and nausea. Medicines can also be given to help prevent recurrent migraines.  HOME CARE INSTRUCTIONS  Only take over-the-counter or prescription medicines for pain or discomfort as directed by your  health care provider. The use of long-term narcotics is not recommended.  Lie down in a dark, quiet room when you have a migraine.  Keep a journal to find out what may trigger your migraine headaches. For example, write down:  What you eat and drink.  How much sleep you get.  Any change to your diet or medicines.  Limit alcohol consumption.  Quit smoking if you smoke.  Get 7 9 hours of sleep, or as recommended by your health care provider.  Limit stress.  Keep lights dim if bright lights bother you and make your migraines worse. SEEK IMMEDIATE MEDICAL CARE IF:   Your migraine becomes severe.  You have a fever.  You have a stiff neck.  You have vision loss.  You have muscular weakness or loss of muscle control.  You start losing your balance or have trouble walking.  You feel faint or pass out.  You have severe symptoms that are different from your first symptoms. MAKE SURE YOU:   Understand these instructions.  Will watch your condition.  Will get help right away if you are not doing well or get worse. Document Released: 07/31/2005 Document Revised: 05/21/2013 Document Reviewed: 04/07/2013 [employer] Patient Information 2014 [employer].  

## 2013-09-09 NOTE — ED Provider Notes (Signed)
CSN: [id number]     Arrival date & time 09/09/13  1109 History   First MD Initiated Contact with Patient 09/09/13 1504     Chief Complaint  Patient presents with  . Migraine   (Consider location/radiation/quality/duration/timing/severity/associated sxs/prior Treatment) Patient is a 43 y.o. female presenting with migraines. The history is provided by the patient.  Migraine This is a recurrent problem. The current episode started 1 to 4 weeks ago. The problem occurs constantly. The problem has been gradually worsening. Associated symptoms include headaches and nausea. Pertinent negatives include no abdominal pain, arthralgias, change in bowel habit, chest pain, chills, congestion, coughing, fever, myalgias, numbness, swollen glands, urinary symptoms, vomiting or weakness. Exacerbated by: bright lights. Treatments tried: sumatriptan. The treatment provided no relief.   43 year old female history of migraines feel like this is similar to previous. Patient felt that her headache was slow onset over about 3 weeks. Denies any certain time of the day at its worst. Worse with bright lights loud noises. Some associated upper neck tenderness patient states is similar to her prior. Denies any fevers cough chills. Patient with history of abnormal menstrual cycle does not recall last menstrual period. Only other past medical history is asthma. Patient has been taking her sumatriptan and which will help temporarily but the headache will then come back. Patient denies any followup for this patient is never seen neurology for this patient does not have a family doctor.  Past Medical History  Diagnosis Date  . Asthma   . Migraines   . Bone spur 2008   Past Surgical History  Procedure Laterality Date  . Shoulder surgery Left 2008    Bone Spur removal   History reviewed. No pertinent family history. History  Substance Use Topics  . Smoking status: Current Some Day Smoker  . Smokeless tobacco: Never Used  .  Alcohol Use: No   OB History   Grav Para Term Preterm Abortions TAB SAB Ect Mult Living   13    13  13   3      Review of Systems  Constitutional: Negative for fever and chills.  HENT: Negative for congestion and rhinorrhea.   Eyes: Negative for redness and visual disturbance.  Respiratory: Negative for cough, shortness of breath and wheezing.   Cardiovascular: Negative for chest pain and palpitations.  Gastrointestinal: Positive for nausea. Negative for vomiting, abdominal pain and change in bowel habit.  Genitourinary: Negative for dysuria and urgency.  Musculoskeletal: Negative for arthralgias and myalgias.  Skin: Negative for pallor and wound.  Neurological: Positive for headaches. Negative for dizziness, weakness and numbness.    Allergies  Review of patient's allergies indicates no known allergies.  Home Medications   Current Outpatient Rx  Name  Route  Sig  Dispense  Refill  . albuterol (PROVENTIL HFA;VENTOLIN HFA) 108 (90 BASE) MCG/ACT inhaler   Inhalation   Inhale 2 puffs into the lungs every 6 (six) hours as needed for wheezing.         Marland Kitchen albuterol (PROVENTIL) (2.5 MG/3ML) 0.083% nebulizer solution   Nebulization   Take 2.5 mg by nebulization every 4 (four) hours as needed for wheezing or shortness of breath.         . SUMAtriptan (IMITREX) 100 MG tablet   Oral   Take 100 mg by mouth every 2 (two) hours as needed for migraine (maximum 2 tablets peer 24 hours). May repeat in 2 hours if headache persists or recurs.  BP 117/75  Pulse 82  Temp(Src) 98.7 F (37.1 C) (Oral)  Resp 20  Ht 5\' 4"  (1.626 m)  Wt 190 lb (86.183 kg)  BMI 32.60 kg/m2  SpO2 100%  LMP 07/20/2013 Physical Exam  Constitutional: She is oriented to person, place, and time. She appears well-developed and well-nourished. No distress.  HENT:  Head: Normocephalic and atraumatic.  Eyes: EOM are normal. Pupils are equal, round, and reactive to light.  Neck: Normal range of motion.  Neck supple.  Cardiovascular: Normal rate and regular rhythm.  Exam reveals no gallop and no friction rub.   No murmur heard. Pulmonary/Chest: Effort normal. She has no wheezes. She has no rales.  Abdominal: Soft. She exhibits no distension. There is no tenderness.  Musculoskeletal: She exhibits no edema and no tenderness.  Neurological: She is alert and oriented to person, place, and time. She has normal strength. No cranial nerve deficit or sensory deficit. She displays a negative Romberg sign. Coordination and gait normal. GCS eye subscore is 4. GCS verbal subscore is 5. GCS motor subscore is 6. She displays no Babinski's sign on the right side. She displays no Babinski's sign on the left side.  Reflex Scores:      Tricep reflexes are 2+ on the right side and 2+ on the left side.      Bicep reflexes are 2+ on the right side and 2+ on the left side.      Brachioradialis reflexes are 2+ on the right side and 2+ on the left side.      Patellar reflexes are 2+ on the right side and 2+ on the left side.      Achilles reflexes are 2+ on the right side and 2+ on the left side. Skin: Skin is warm and dry. She is not diaphoretic.  Psychiatric: She has a normal mood and affect. Her behavior is normal.    ED Course  Procedures (including critical care time) Labs Review Labs Reviewed  POCT PREGNANCY, URINE   Imaging Review No results found.  EKG Interpretation   None       MDM   1. Migraine     43 year old female history of migraines states is similar to her past migraines for 3 weeks ago was not acute onset prolonged course slowly getting worsening patient with some visual symptoms neck pain this is typical of her migraines.  We'll treat with migraine cocktail, see no need for head CT at this time without acute onset, similar to prior headaches. Doubt subarachnoid, or meninginitis, or CO poisoning clinically at this time.      I have discussed the diagnosis/risks/treatment options  with the patient and believe the pt to be eligible for discharge home to follow-up with PCP. We also discussed returning to the ED immediately if new or worsening sx occur. We discussed the sx which are most concerning (e.g., syncope, change in headache, fever) that necessitate immediate return. Medications administered to the patient during their visit and any new prescriptions provided to the patient are listed below.  Medications  dexamethasone (DECADRON) injection 10 mg (10 mg Intravenous Given 09/09/13 1537)  diphenhydrAMINE (BENADRYL) injection 50 mg (50 mg Intravenous Given 09/09/13 1537)  sodium chloride 0.9 % bolus 1,000 mL (0 mLs Intravenous Stopped 09/09/13 1736)  prochlorperazine (COMPAZINE) injection 10 mg (10 mg Intravenous Given 09/09/13 1552)    Discharge Medication List as of 09/09/2013  5:29 PM         Deno Etienne, MD 09/10/13 [phone]

## 2013-09-10 NOTE — ED Provider Notes (Signed)
Medical screening examination/treatment/procedure(s) were conducted as a shared visit with non-physician practitioner(s) or resident and myself. I personally evaluated the patient during the encounter and agree with the findings and plan unless otherwise indicated.  I have personally reviewed any xrays and/ or EKG's with the provider and I agree with interpretation.  Migraine hx. Similar to previous. Normal medicines not helping. Gradual onset over 3 wks. No head injury, neck stiffness, travel, CO exposure or bleeding hx. Photophobia, mild nausea. 5+ strength in UE and LE with f/e at major joints.  Sensation to palpation intact in UE and LE. CNs 2-12 grossly intact. EOMFI. PERRL. Finger nose and coordination intact bilateral. Visual fields intact to finger testing. Plan for supportive care. Neck supple, no meningismus. Pain medicines given. Pt improved significantly in ED.   Headache  Mariea Clonts, MD 09/10/13 [phone]

## 2013-12-29 ENCOUNTER — Emergency Department (HOSPITAL_COMMUNITY): Admission: EM | Admit: 2013-12-29 | Discharge: 2013-12-29 | Discharge status: Against Medical Advice | Payer: Self-pay

## 2013-12-29 ENCOUNTER — Encounter (HOSPITAL_COMMUNITY): Payer: Self-pay | Admitting: Emergency Medicine

## 2013-12-29 ENCOUNTER — Emergency Department (HOSPITAL_COMMUNITY)
Admission: EM | Admit: 2013-12-29 | Discharge: 2013-12-29 | Disposition: A | Discharge status: Home / Self Care | Payer: BC Managed Care – PPO | Attending: Emergency Medicine | Admitting: Emergency Medicine

## 2013-12-29 ENCOUNTER — Emergency Department (HOSPITAL_COMMUNITY): Payer: BC Managed Care – PPO

## 2013-12-29 DIAGNOSIS — G43909 Migraine, unspecified, not intractable, without status migrainosus: Secondary | ICD-10-CM | POA: Insufficient documentation

## 2013-12-29 DIAGNOSIS — J45909 Unspecified asthma, uncomplicated: Secondary | ICD-10-CM | POA: Insufficient documentation

## 2013-12-29 DIAGNOSIS — M549 Dorsalgia, unspecified: Secondary | ICD-10-CM | POA: Insufficient documentation

## 2013-12-29 DIAGNOSIS — M5430 Sciatica, unspecified side: Secondary | ICD-10-CM

## 2013-12-29 DIAGNOSIS — Z3202 Encounter for pregnancy test, result negative: Secondary | ICD-10-CM | POA: Insufficient documentation

## 2013-12-29 DIAGNOSIS — F17200 Nicotine dependence, unspecified, uncomplicated: Secondary | ICD-10-CM | POA: Insufficient documentation

## 2013-12-29 LAB — URINALYSIS, ROUTINE W REFLEX MICROSCOPIC
Bilirubin Urine: NEGATIVE
GLUCOSE, UA: NEGATIVE mg/dL
HGB URINE DIPSTICK: NEGATIVE
Ketones, ur: NEGATIVE mg/dL
LEUKOCYTES UA: NEGATIVE
Nitrite: NEGATIVE
PH: 5.5 (ref 5.0–8.0)
Protein, ur: NEGATIVE mg/dL
Specific Gravity, Urine: 1.025 (ref 1.005–1.030)
Urobilinogen, UA: 1 mg/dL (ref 0.0–1.0)

## 2013-12-29 LAB — POC URINE PREG, ED: PREG TEST UR: NEGATIVE

## 2013-12-29 MED ORDER — MELOXICAM 7.5 MG PO TABS: 7.5000 mg | ORAL_TABLET | Freq: Every day | ORAL | Status: DC

## 2013-12-29 MED ORDER — METHOCARBAMOL 500 MG PO TABS: 500.0000 mg | ORAL_TABLET | Freq: Two times a day (BID) | ORAL | Status: DC
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
## 2013-12-29 NOTE — ED Notes (Signed)
Pt reports 3 day hx of low back pain, unresponsive to Flexeril, Vicodin, Motrin. Pain radiates down l/leg

## 2013-12-29 NOTE — Discharge Instructions (Signed)
Please call your doctor for a followup appointment within 24-48 hours. When you talk to your doctor please let them know that you were seen in the emergency department and have them acquire all of your records so that they can discuss the findings with you and formulate a treatment plan to fully care for your new and ongoing problems. Please call and set up him with her primary care provider to be seen and reassessed within the next 24-48 hours Please call and setup an appointment with orthopedics and neurosurgery orthopedics continue Please rest and stay hydrated Please avoid any physical or shortness activity Please take medications as prescribed While at work please obtain a back brace to aid in proper lifting Please continue to apply icy hot ointment and massage Please continue monitor symptoms closely and if symptoms are worsen or change (fever greater than 101, chills, chest pain, shortness of breath, difficulty breathing, numbness, tingling, worsening or changes to pain symptoms, fall, injury, inability to control urine or bowel movements, loss of sensation to legs, inability to stand) please report back to the ED immediately  Back Pain, Adult Low back pain is very common. About 1 in 5 people have back pain.The cause of low back pain is rarely dangerous. The pain often gets better over time.About half of people with a sudden onset of back pain feel better in just 2 weeks. About 8 in 10 people feel better by 6 weeks.  CAUSES Some common causes of back pain include:  Strain of the muscles or ligaments supporting the spine.  Wear and tear (degeneration) of the spinal discs.  Arthritis.  Direct injury to the back. DIAGNOSIS Most of the time, the direct cause of low back pain is not known.However, back pain can be treated effectively even when the exact cause of the pain is unknown.Answering your caregiver's questions about your overall health and symptoms is one of the most accurate  ways to make sure the cause of your pain is not dangerous. If your caregiver needs more information, he or she may order lab work or imaging tests (X-rays or MRIs).However, even if imaging tests show changes in your back, this usually does not require surgery. HOME CARE INSTRUCTIONS For many people, back pain returns.Since low back pain is rarely dangerous, it is often a condition that people can learn to [facility] their own.   Remain active. It is stressful on the back to sit or stand in one place. Do not sit, drive, or stand in one place for more than 30 minutes at a time. Take short walks on level surfaces as soon as pain allows.Try to increase the length of time you walk each day.  Do not stay in bed.Resting more than 1 or 2 days can delay your recovery.  Do not avoid exercise or work.Your body is made to move.It is not dangerous to be active, even though your back may hurt.Your back will likely heal faster if you return to being active before your pain is gone.  Pay attention to your body when you bend and lift. Many people have less discomfortwhen lifting if they bend their knees, keep the load close to their bodies,and avoid twisting. Often, the most comfortable positions are those that put less stress on your recovering back.  Find a comfortable position to sleep. Use a firm mattress and lie on your side with your knees slightly bent. If you lie on your back, put a pillow under your knees.  Only take over-the-counter or prescription  medicines as directed by your caregiver. Over-the-counter medicines to reduce pain and inflammation are often the most helpful.Your caregiver may prescribe muscle relaxant drugs.These medicines help dull your pain so you can more quickly return to your normal activities and healthy exercise.  Put ice on the injured area.  Put ice in a plastic bag.  Place a towel between your skin and the bag.  Leave the ice on for 15-20 minutes, 03-04 times a day  for the first 2 to 3 days. After that, ice and heat may be alternated to reduce pain and spasms.  Ask your caregiver about trying back exercises and gentle massage. This may be of some benefit.  Avoid feeling anxious or stressed.Stress increases muscle tension and can worsen back pain.It is important to recognize when you are anxious or stressed and learn ways to manage it.Exercise is a great option. SEEK MEDICAL CARE IF:  You have pain that is not relieved with rest or medicine.  You have pain that does not improve in 1 week.  You have new symptoms.  You are generally not feeling well. SEEK IMMEDIATE MEDICAL CARE IF:   You have pain that radiates from your back into your legs.  You develop new bowel or bladder control problems.  You have unusual weakness or numbness in your arms or legs.  You develop nausea or vomiting.  You develop abdominal pain.  You feel faint. Document Released: 07/31/2005 Document Revised: 01/30/2012 Document Reviewed: 12/19/2010 [facility] Patient Information 2014 [place], [place].   Back Exercises Back exercises help treat and prevent back injuries. The goal is to increase your strength in your belly (abdominal) and back muscles. These exercises can also help with flexibility. Start these exercises when told by your doctor. HOME CARE Back exercises include: Pelvic Tilt.  Lie on your back with your knees bent. Tilt your pelvis until the lower part of your back is against the floor. Hold this position 5 to 10 sec. Repeat this exercise 5 to 10 times. Knee to Chest.  Pull 1 knee up against your chest and hold for 20 to 30 seconds. Repeat this with the other knee. This may be done with the other leg straight or bent, whichever feels better. Then, pull both knees up against your chest. Sit-Ups or Curl-Ups.  Bend your knees 90 degrees. Start with tilting your pelvis, and do a partial, slow sit-up. Only lift your upper half 30 to 45 degrees off the floor.  Take at least 2 to 3 seonds for each sit-up. Do not do sit-ups with your knees out straight. If partial sit-ups are difficult, simply do the above but with only tightening your belly (abdominal) muscles and holding it as told. Hip-Lift.  Lie on your back with your knees flexed 90 degrees. Push down with your feet and shoulders as you raise your hips 2 inches off the floor. Hold for 10 seconds, repeat 5 to 10 times. Back Arches.  Lie on your stomach. Prop yourself up on bent elbows. Slowly press on your hands, causing an arch in your low back. Repeat 3 to 5 times. Shoulder-Lifts.  Lie face down with arms beside your body. Keep hips and belly pressed to floor as you slowly lift your head and shoulders off the floor. Do not overdo your exercises. Be careful in the beginning. Exercises may cause you some mild back discomfort. If the pain lasts for more than 15 minutes, stop the exercises until you see your doctor. Improvement with exercise for back problems  is slow.  Document Released: 09/02/2010 Document Revised: 10/23/2011 Document Reviewed: 06/01/2011 [facility] Patient Information 2014 [place], [place].   Emergency Department Resource Guide 1) Find a Doctor and Pay Out of Pocket Although you won't have to find out who is covered by your insurance plan, it is a good idea to ask around and get recommendations. You will then need to call the office and see if the doctor you have chosen will accept you as a new patient and what types of options they offer for patients who are self-pay. Some doctors offer discounts or will set up payment plans for their patients who do not have insurance, but you will need to ask so you aren't surprised when you get to your appointment.  2) Contact Your Local Health Department Not all health departments have doctors that can see patients for sick visits, but many do, so it is worth a call to see if yours does. If you don't know where your local health department is, you  can check in your phone book. The CDC also has a tool to help you locate your state's health department, and many state websites also have listings of all of their local health departments.  3) Find a [facility] If your illness is not likely to be very severe or complicated, you may want to try a walk in clinic. These are popping up all over the country in pharmacies, drugstores, and shopping centers. They're usually staffed by nurse practitioners or physician assistants that have been trained to treat common illnesses and complaints. They're usually fairly quick and inexpensive. However, if you have serious medical issues or chronic medical problems, these are probably not your best option.  No Primary Care Doctor: - Call Health Connect at  [phone] - they can help you locate a primary care doctor that  accepts your insurance, provides certain services, etc. - Physician Referral Service- [phone]  Chronic Pain Problems: Organization         Address  Phone   Notes  [facility]  [phone] Patients need to be referred by their primary care doctor.   Medication Assistance: Organization         Address  Phone   Notes  [facility] Medication [facility] [address] [place], [place] [address] [phone] --Must be a resident of [facility] -- Must have NO insurance coverage whatsoever (no Medicaid/ Medicare, etc.) -- The pt. MUST have a primary care doctor that directs their care regularly and follows them in the community   MedAssist  [phone]   [employer]  [phone]    Agencies that provide inexpensive medical care: Organization         Address  Phone   Notes  [place]  [phone]   [facility]    [phone]   [facility] [place], [place] [address] [phone]   [place] [address] [place] [phone]   Planned Parenthood    [phone]   [facility]    [phone]   [place] and [place] [address], [place] Phone:  [phone], Fax:  [phone] Hours of Operation:  9 am - 6 pm, M-F.  Also accepts Medicaid/Medicare and self-pay.  [facility] for [place] [address], [address], [place] Phone: [phone], Fax: [phone].  Hours of Operation:  8:30 am - 5:30 pm, M-F.  Also accepts Medicaid and self-pay.  [facility] [place] [address] [place] Phone: [phone]   [place], [place], [place] [phone], Ext. [phone] Mondays & Thursdays: 7-9 AM.  First 15 patients are seen on a first come, first serve basis.    [place] Providers:  Organization         Address  Phone   Notes  [facility] [address], [place] [phone] Also accepts self-pay patients.  [facility] [address] [facility] [address], [place]  [phone]   [place], [address], [place] [phone]   [facility] [address], [place] [phone]   Lucianne Lei [address], [place]   [phone] Only accepts [place] Access [place] patients after they have their name applied to their card.   Self-Pay (no insurance) in [facility]:  Organization         Address  Phone   Notes  Sickle Cell Patients, [facility] [place] [phone]   [facility] [place] [phone]   [facility] [place]  [place], [place], [place] [phone]   [facility]/Dr. Osei-Bonsu  [address], [place] or [address], [place] [phone] Phone number for both [place] and [place] locations  is the same.  Urgent Medical and [facility] [address] [place] [phone]   [facility] [address], [place] or [address] [phone]   [facility] [address] [place] [phone], phone; [phone], fax Sees patients 1st and 3rd Saturday of every month.  Must not qualify for public or private insurance (i.e. Medicaid, Medicare, [place] Health Choice, Veterans' Benefits)  Household income should be no more than 200% of the poverty level The clinic cannot treat you if you are pregnant or think you are pregnant  Sexually transmitted diseases are not treated at the clinic.    Dental Care: Organization         Address  Phone  Notes  [facility] Department of [facility] [address] [phone] Accepts children up to age 78 who are enrolled in [place] or [place]; pregnant women with a Medicaid card; and children who have applied for Medicaid or [place] Health Choice, but were declined, whose parents can pay a reduced fee at time of service.  [facility] Department of [facility]  [address], [place] [phone] Accepts children up to age 7 who are enrolled in [place] or [place]; pregnant women with a Medicaid card; and children who have applied for Medicaid or [place] Health Choice, but were declined, whose parents can pay a reduced fee at time of service.  [place] Adult Dental Access PROGRAM  [place] [phone] Patients are seen by appointment only. Walk-ins are not accepted. [place] will see patients 63 years of age and older. Monday - Tuesday (8am-5pm) Most Wednesdays (8:30-5pm) $30 per visit, cash only  [facility] Adult Dental Access PROGRAM  [address], [place] [phone] Patients are  seen by appointment only. Walk-ins are not accepted. [place] will see  patients 59 years of age and older. One Wednesday Evening (Monthly: Volunteer Based).  $30 per visit, cash only  [place]  [phone] for adults; Children under age 20, call Graduate Pediatric Dentistry at [phone]. Children aged 92-14, please call [phone] to request a pediatric application.  Dental services are provided in all areas of dental care including fillings, crowns and bridges, complete and partial dentures, implants, gum treatment, root canals, and extractions. Preventive care is also provided. Treatment is provided to both adults and children. Patients are selected via a lottery and there is often a waiting list.   [facility] [address], [place]  [phone] [email or web address]   [facility] [address] [place], [place] [phone], Ext. [phone] Second and Fourth Thursday of each month, opens at 6:30 AM; Clinic ends at 9 AM.  Patients are seen on a first-come first-served basis, and a limited number are seen during each clinic.   [facility]  [address] Hillard Danker [place], [place] [phone]   Eligibility Requirements You must have lived in [place], [place], or [place] counties for at least the last three months.   You cannot be eligible for state or federal sponsored [employer], including [employer], [place], or [employer].   You generally cannot be eligible for healthcare insurance through your employer.    How to apply: Eligibility screenings are held every Tuesday and Wednesday afternoon from 1:00 pm until 4:00 pm. You do not need an appointment for the interview!  [facility] [address] [place], [place]   [place]  [place] Department  [place]  [phone]    Behavioral Health Resources in the Community: Intensive Outpatient  Programs Organization         Address  Phone  Notes  [place]. [address] [place], [place] [phone]   [facility] Outpatient [address], [place], [place]   ADS: Alcohol & Drug Svcs [address], [place] [employer], [place]   [place] [address]  [place], [place] or [phone]   Substance Abuse Resources Organization         Address  Phone  Notes  Alcohol and Drug Services  [phone]   [place]  [phone]   [facility] [place]   Chinita Pester  [phone]   Residential & Outpatient Substance Abuse Program  [phone]   Psychological Services Organization         Address  Phone  Notes  [facility] [place]  [place]  [phone]   [place] [address] [place] or [phone]    Mobile Crisis Teams Organization         Address  Phone  Notes  [facility], Mobile Crisis Care Unit  [phone]   [facility]  [address] [place], [place]   [place] [address], [place] [phone]    Self-Help/Support Groups Organization         Address  Phone             Notes  [place]. of [place] - variety of support groups  [place] Call for  more information  Narcotics Anonymous (NA), [facility] [address], [employer] [place]  2 meetings at this location   Residential Facilities manager         Address  Phone  Notes  [facility] [place],    [place]  [phone]   [facility]  [address] [place] [phone], [place], [place]   [place], [place] [phone] Admissions: 8am-3pm M-F  Incentives Substance [place] [address],    [place], [place]  [phone]   [facility] [address] [place], [place], [place]   [facility] [address]  [place], [place]   [facility] - Intensive Outpatient [address], [place], [place]   [facility] ([place].,  [place], [place] [phone] or [phone]   [facility] Services (RTS) [address] [place], [place] Accepts Medicaid  [facility] [place] [address],  [place] [phone] Substance Abuse/Addiction Treatment   [facility] Organization         Address  Phone  Notes  [facility]  [phone]   Domenic Schwab, PhD [address] Arlis [place], [place]   [phone] or [phone]   [place], [place] [phone]   [facility] [address] [place], [place] [phone] Insurance/Medicaid/sponsorship through [facility] and Families [address] [place]                                    [place], [place] [phone] [place] [address], [place] [phone]    Dr. Adele Schilder  [phone]   [facility] Dept. 1) [address] [place] 2) [place] 3)  [place] [address], [place] [phone]  [phone]   [place] [phone] or [phone] (After Hours)

## 2013-12-30 NOTE — ED Provider Notes (Signed)
5:39 PM Mobic discontinued. This provider spoke with Jacquelynn Cree, pharmacist from [facility], patient's documented pharmacy. Discussed with pharmacist that prescription for Mobic will be discontinued. Spoke with Buyer, retail, Nicole Kindred, regarding discontinuation of medications - discussed that pharmacy was contacted, but the numbers on the patient's record do not work. Flow Manager reported that they will try to contact the patient as well.   Jamse Mead, PA-C 01/02/14 [phone]

## 2013-12-30 NOTE — ED Provider Notes (Signed)
Medical screening examination/treatment/procedure(s) were performed by non-physician practitioner and as supervising physician I was immediately available for consultation/collaboration.    Neta Ehlers, MD 12/30/13 [phone]

## 2014-03-24 ENCOUNTER — Other Ambulatory Visit: Payer: Self-pay | Admitting: Obstetrics and Gynecology

## 2014-03-24 DIAGNOSIS — R928 Other abnormal and inconclusive findings on diagnostic imaging of breast: Secondary | ICD-10-CM

## 2014-04-01 ENCOUNTER — Encounter (INDEPENDENT_AMBULATORY_CARE_PROVIDER_SITE_OTHER): Payer: Self-pay

## 2014-04-01 ENCOUNTER — Ambulatory Visit
Admission: RE | Admit: 2014-04-01 | Discharge: 2014-04-01 | Disposition: A | Discharge status: Home / Self Care | Payer: BC Managed Care – PPO | Source: Ambulatory Visit | Attending: Obstetrics and Gynecology | Admitting: Obstetrics and Gynecology

## 2014-04-01 DIAGNOSIS — R928 Other abnormal and inconclusive findings on diagnostic imaging of breast: Secondary | ICD-10-CM

## 2014-05-25 ENCOUNTER — Ambulatory Visit (INDEPENDENT_AMBULATORY_CARE_PROVIDER_SITE_OTHER): Payer: BC Managed Care – PPO | Admitting: Physician Assistant

## 2014-05-25 VITALS — BP 125/86 | HR 60 | Temp 98.2°F | Resp 12 | Ht 63.5 in | Wt 218.0 lb

## 2014-05-25 DIAGNOSIS — J45909 Unspecified asthma, uncomplicated: Secondary | ICD-10-CM

## 2014-05-25 DIAGNOSIS — G8929 Other chronic pain: Secondary | ICD-10-CM | POA: Insufficient documentation

## 2014-05-25 DIAGNOSIS — M549 Dorsalgia, unspecified: Secondary | ICD-10-CM | POA: Insufficient documentation

## 2014-05-25 DIAGNOSIS — R112 Nausea with vomiting, unspecified: Secondary | ICD-10-CM

## 2014-05-25 DIAGNOSIS — G43909 Migraine, unspecified, not intractable, without status migrainosus: Secondary | ICD-10-CM | POA: Insufficient documentation

## 2014-05-25 MED ORDER — PROMETHAZINE HCL 25 MG/ML IJ SOLN
25.0000 mg | Freq: Once | INTRAMUSCULAR | Status: AC
  Administered 2014-05-25: 25 mg via INTRAMUSCULAR

## 2014-05-25 MED ORDER — ALBUTEROL SULFATE HFA 108 (90 BASE) MCG/ACT IN AERS: 2.0000 | INHALATION_SPRAY | Freq: Four times a day (QID) | RESPIRATORY_TRACT | Status: DC | PRN

## 2014-05-25 MED ORDER — ALBUTEROL SULFATE (2.5 MG/3ML) 0.083% IN NEBU: 2.5000 mg | INHALATION_SOLUTION | RESPIRATORY_TRACT | Status: DC | PRN

## 2014-05-25 MED ORDER — PROMETHAZINE HCL 25 MG PO TABS: 25.0000 mg | ORAL_TABLET | Freq: Four times a day (QID) | ORAL | Status: DC | PRN

## 2014-05-25 NOTE — Patient Instructions (Signed)
Gradually advance your diet, as tolerated, once you can re-hydrate.

## 2014-05-25 NOTE — Progress Notes (Signed)
Subjective:    Patient ID: Yesenia Wheeler, female    DOB: 1970/12/05, 43 y.o.   MRN: [id number]   PCP: No PCP Per Patient  Chief Complaint  Patient presents with  . Nausea    x4 days  . Emesis    x4 days  . Dizziness    x4 days  . Anorexia    x4 days  . other    pt states she has an metalic taste in her mouth that will not go away  . Chills    denies having fever  . Medication Refill    pt needs refill on Albuterol   Medications, allergies, past medical history, surgical history, family history, social history and problem list reviewed and updated.  Patient Active Problem List   Diagnosis Date Noted  . Asthma, chronic 05/25/2014  . Migraine 05/25/2014  . Chronic back pain 05/25/2014    Prior to Admission medications   Medication Sig Start Date End Date Taking? Authorizing Provider  albuterol (PROVENTIL HFA;VENTOLIN HFA) 108 (90 BASE) MCG/ACT inhaler Inhale 2 puffs into the lungs every 6 (six) hours as needed for wheezing.   Yes Historical Provider, MD  albuterol (PROVENTIL) (2.5 MG/3ML) 0.083% nebulizer solution Take 2.5 mg by nebulization every 4 (four) hours as needed for wheezing or shortness of breath.   Yes Historical Provider, MD  SUMAtriptan (IMITREX) 100 MG tablet Take 100 mg by mouth every 2 (two) hours as needed for migraine (maximum 2 tablets peer 24 hours). May repeat in 2 hours if headache persists or recurs.   Yes Historical Provider, MD     HPI  This 43 y.o. female presents for evaluation of nausea and vomiting x 4 days. Associated with loss of appetite, dizziness and metallic taste. Started her period on Thursday, 05/21/2014. 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 "At work today it was torture. I just want to lie down." Unable to keep food down, but able to keep liquids down-though not getting much. 8 ounces of juice this morning, again at lunch, a couple of sips of gingerale at 5 pm. No  specific sick contacts. Subjective chills. No abdominal pain, diarrhea. No urinary changes. No unexplained myalgias/arthralgias or rash.  Review of Systems As above. No CP, SOB. No vision changes.    Objective:   Physical Exam  Vitals reviewed. Constitutional: She is oriented to person, place, and time. Vital signs are normal. She appears well-developed and well-nourished. She is active and cooperative. No distress.  BP 125/86  Pulse 60  Temp(Src) 98.2 F (36.8 C) (Oral)  Resp 12  Ht 5' 3.5" (1.613 m)  Wt 218 lb (98.884 kg)  BMI 38.01 kg/m2  SpO2 100%  LMP 05/21/2014  HENT:  Head: Normocephalic and atraumatic.  Right Ear: Hearing, tympanic membrane, external ear and ear canal normal.  Left Ear: Hearing, tympanic membrane, external ear and ear canal normal.  Nose: Nose normal.  Mouth/Throat: Uvula is midline, oropharynx is clear and moist and mucous membranes are normal.  Eyes: Conjunctivae are normal. No scleral icterus.  Neck: Normal range of motion. Neck supple. No thyromegaly present.  Cardiovascular: Normal rate, regular rhythm and normal heart sounds.   Pulses:      Radial pulses are 2+ on the right side, and 2+ on the left side.  Pulmonary/Chest: Effort normal and breath sounds normal.  Abdominal: Soft. Normal appearance and bowel sounds are normal. There is no hepatosplenomegaly. There is tenderness (mild) in  the epigastric area. There is no rigidity, no rebound, no guarding, no CVA tenderness, no tenderness at McBurney's point and negative Murphy's sign.  Lymphadenopathy:       Head (right side): No tonsillar, no preauricular, no posterior auricular and no occipital adenopathy present.       Head (left side): No tonsillar, no preauricular, no posterior auricular and no occipital adenopathy present.    She has no cervical adenopathy.       Right: No supraclavicular adenopathy present.       Left: No supraclavicular adenopathy present.  Neurological: She is alert and  oriented to person, place, and time. No sensory deficit.  Skin: Skin is warm, dry and intact. No rash noted. No cyanosis or erythema. Nails show no clubbing.  Psychiatric: She has a normal mood and affect. Her speech is normal and behavior is normal.   Attempt at IV access unsuccessful-the patient reports that her last IV was started in her foot, and prior to that in the neck.       Assessment & Plan:  1. Non-intractable vomiting with nausea, vomiting of unspecified type I suspect this is a viral enteritis.  After 2 doses of promethazine here, her nausea was substantially improved. Continue oral hydration at home, advance diet as tolerated and RTC if symptoms worsen or persist. - promethazine (PHENERGAN) injection 25 mg; Inject 1 mL (25 mg total) into the muscle once. - promethazine (PHENERGAN) injection 25 mg; Inject 1 mL (25 mg total) into the muscle once. - promethazine (PHENERGAN) 25 MG tablet; Take 1 tablet (25 mg total) by mouth every 6 (six) hours as needed for nausea or vomiting.  Dispense: 30 tablet; Refill: 0  2. Asthma, chronic, unspecified asthma severity, uncomplicated Refilled her rescue medications. Will need PCP and spirometry once she recovers from acute illness. - albuterol (PROVENTIL HFA;VENTOLIN HFA) 108 (90 BASE) MCG/ACT inhaler; Inhale 2 puffs into the lungs every 6 (six) hours as needed for wheezing.  Dispense: 1 Inhaler; Refill: 2 - albuterol (PROVENTIL) (2.5 MG/3ML) 0.083% nebulizer solution; Take 3 mLs (2.5 mg total) by nebulization every 4 (four) hours as needed for wheezing or shortness of breath.  Dispense: 75 mL; Refill: 2   Fara Chute, PA-C Physician Assistant-Certified [facility]

## 2014-05-26 DIAGNOSIS — E669 Obesity, unspecified: Secondary | ICD-10-CM | POA: Insufficient documentation

## 2014-06-10 ENCOUNTER — Ambulatory Visit: Payer: BC Managed Care – PPO

## 2014-06-10 ENCOUNTER — Ambulatory Visit (INDEPENDENT_AMBULATORY_CARE_PROVIDER_SITE_OTHER): Payer: BC Managed Care – PPO

## 2014-06-10 ENCOUNTER — Ambulatory Visit (INDEPENDENT_AMBULATORY_CARE_PROVIDER_SITE_OTHER): Payer: BC Managed Care – PPO | Admitting: Family Medicine

## 2014-06-10 VITALS — BP 108/70 | HR 72 | Temp 99.6°F | Resp 16 | Ht 64.25 in | Wt 220.0 lb

## 2014-06-10 DIAGNOSIS — R062 Wheezing: Secondary | ICD-10-CM

## 2014-06-10 DIAGNOSIS — R05 Cough: Secondary | ICD-10-CM

## 2014-06-10 DIAGNOSIS — J322 Chronic ethmoidal sinusitis: Secondary | ICD-10-CM

## 2014-06-10 DIAGNOSIS — R059 Cough, unspecified: Secondary | ICD-10-CM

## 2014-06-10 DIAGNOSIS — J029 Acute pharyngitis, unspecified: Secondary | ICD-10-CM

## 2014-06-10 DIAGNOSIS — J45909 Unspecified asthma, uncomplicated: Secondary | ICD-10-CM

## 2014-06-10 DIAGNOSIS — J209 Acute bronchitis, unspecified: Secondary | ICD-10-CM

## 2014-06-10 LAB — POCT URINE PREGNANCY: Preg Test, Ur: NEGATIVE

## 2014-06-10 LAB — POCT RAPID STREP A (OFFICE): Rapid Strep A Screen: NEGATIVE

## 2014-06-10 MED ORDER — METHYLPREDNISOLONE (PAK) 4 MG PO TABS: ORAL_TABLET | ORAL | Status: DC

## 2014-06-10 MED ORDER — HYDROCODONE-HOMATROPINE 5-1.5 MG/5ML PO SYRP: 5.0000 mL | ORAL_SOLUTION | Freq: Every evening | ORAL | Status: DC | PRN

## 2014-06-10 MED ORDER — ALBUTEROL SULFATE (2.5 MG/3ML) 0.083% IN NEBU: 2.5000 mg | INHALATION_SOLUTION | RESPIRATORY_TRACT | Status: DC | PRN

## 2014-06-10 MED ORDER — AMOXICILLIN-POT CLAVULANATE 875-125 MG PO TABS
1.0000 | ORAL_TABLET | Freq: Two times a day (BID) | ORAL | Status: DC
Start: 2014-06-10 — End: 2015-02-11

## 2014-06-10 MED ORDER — ALBUTEROL SULFATE HFA 108 (90 BASE) MCG/ACT IN AERS: 2.0000 | INHALATION_SPRAY | Freq: Four times a day (QID) | RESPIRATORY_TRACT | Status: DC | PRN

## 2014-06-10 MED ORDER — BENZONATATE 100 MG PO CAPS: 200.0000 mg | ORAL_CAPSULE | Freq: Two times a day (BID) | ORAL | Status: DC | PRN

## 2014-06-10 NOTE — Patient Instructions (Signed)
Acute Bronchitis °Bronchitis is inflammation of the airways that extend from the windpipe into the lungs (bronchi). The inflammation often causes mucus to develop. This leads to a cough, which is the most common symptom of bronchitis.  °In acute bronchitis, the condition usually develops suddenly and goes away over time, usually in a couple weeks. Smoking, allergies, and asthma can make bronchitis worse. Repeated episodes of bronchitis may cause further lung problems.  °CAUSES °Acute bronchitis is most often caused by the same virus that causes a cold. The virus can spread from person to person (contagious) through coughing, sneezing, and touching contaminated objects. °SIGNS AND SYMPTOMS  °· Cough.   °· Fever.   °· Coughing up mucus.   °· Body aches.   °· Chest congestion.   °· Chills.   °· Shortness of breath.   °· Sore throat.   °DIAGNOSIS  °Acute bronchitis is usually diagnosed through a physical exam. Your health care provider will also ask you questions about your medical history. Tests, such as chest X-rays, are sometimes done to rule out other conditions.  °TREATMENT  °Acute bronchitis usually goes away in a couple weeks. Oftentimes, no medical treatment is necessary. Medicines are sometimes given for relief of fever or cough. Antibiotic medicines are usually not needed but may be prescribed in certain situations. In some cases, an inhaler may be recommended to help reduce shortness of breath and control the cough. A cool mist vaporizer may also be used to help thin bronchial secretions and make it easier to clear the chest.  °HOME CARE INSTRUCTIONS °· Get plenty of rest.   °· Drink enough fluids to keep your urine clear or pale yellow (unless you have a medical condition that requires fluid restriction). Increasing fluids may help thin your respiratory secretions (sputum) and reduce chest congestion, and it will prevent dehydration.   °· Take medicines only as directed by your health care provider. °· If  you were prescribed an antibiotic medicine, finish it all even if you start to feel better. °· Avoid smoking and secondhand smoke. Exposure to cigarette smoke or irritating chemicals will make bronchitis worse. If you are a smoker, consider using nicotine gum or skin patches to help control withdrawal symptoms. Quitting smoking will help your lungs heal faster.   °· Reduce the chances of another bout of acute bronchitis by washing your hands frequently, avoiding people with cold symptoms, and trying not to touch your hands to your mouth, nose, or eyes.   °· Keep all follow-up visits as directed by your health care provider.   °SEEK MEDICAL CARE IF: °Your symptoms do not improve after 1 week of treatment.  °SEEK IMMEDIATE MEDICAL CARE IF: °· You develop an increased fever or chills.   °· You have chest pain.   °· You have severe shortness of breath. °· You have bloody sputum.   °· You develop dehydration. °· You faint or repeatedly feel like you are going to pass out. °· You develop repeated vomiting. °· You develop a severe headache. °MAKE SURE YOU:  °· Understand these instructions. °· Will watch your condition. °· Will get help right away if you are not doing well or get worse. °Document Released: 09/07/2004 Document Revised: 12/15/2013 Document Reviewed: 01/21/2013 °ExitCare® Patient Information ©2015 [employer]. This information is not intended to replace advice given to you by your health care provider. Make sure you discuss any questions you have with your health care provider. ° °

## 2014-06-10 NOTE — Progress Notes (Signed)
 Chief Complaint:  Chief Complaint  Patient presents with  . Chest Pain    x3 days; she states she is having tightness in her chest   . Sore Throat    x3 days pt states it hurts to swallow  . Fatigue    x3 days; pt states she has chronic fatigue  . Cough    x3 days; coughing up white phelgm  . Chills    x3 days  . Fever    up to 100.7 but has broke since    HPI: Yesenia Wheeler is a 43 y.o. female who is here for  3 day hx of chills, fevers, fatigue, sore throat, and plueritic CP with coughing , wheezing. She has asthma. She ran out of her nebs at home and also her inahler.  She works with the elderly. She has not had the flu vaccine.  She has taken tyleonl thera flu without relief. She has felt chills, and has ben tired.   Past Medical History  Diagnosis Date  . Asthma   . Migraines   . Bone spur 2008  . Anemia    Past Surgical History  Procedure Laterality Date  . Shoulder surgery Left 2008    Bone Spur removal  . Cholecystectomy    . Gastric bypass    . Knee surgery    . Cesarean section     History   Social History  . Marital Status: Married    Spouse Name: [name]    Number of Children: 2  . Years of Education: Associates   Occupational History  . LPN [employer]   Social History Main Topics  . Smoking status: Former Research scientist (life sciences)  . Smokeless tobacco: Never Used  . Alcohol Use: Yes     Comment: less than once a week-usually only holidays/special occasions  . Drug Use: No  . Sexual Activity: None   Other Topics Concern  . None   Social History Narrative   Lives with her husband and their children   Family History  Problem Relation Age of Onset  . Cancer Father     oral   No Known Allergies Prior to Admission medications   Medication Sig Start Date End Date Taking? Authorizing Provider  albuterol (PROVENTIL HFA;VENTOLIN HFA) 108 (90 BASE) MCG/ACT inhaler Inhale 2 puffs into the lungs every 6 (six) hours as  needed for wheezing. 05/25/14  Yes Chelle S Jeffery, PA-C  albuterol (PROVENTIL) (2.5 MG/3ML) 0.083% nebulizer solution Take 3 mLs (2.5 mg total) by nebulization every 4 (four) hours as needed for wheezing or shortness of breath. 05/25/14  Yes Chelle S Jeffery, PA-C  promethazine (PHENERGAN) 25 MG tablet Take 1 tablet (25 mg total) by mouth every 6 (six) hours as needed for nausea or vomiting. 05/25/14  Yes Chelle S Jeffery, PA-C  SUMAtriptan (IMITREX) 100 MG tablet Take 100 mg by mouth every 2 (two) hours as needed for migraine (maximum 2 tablets peer 24 hours). May repeat in 2 hours if headache persists or recurs.   Yes Historical Provider, MD     ROS: The patient denies  night sweats, unintentional weight loss, chest pain, palpitations, dyspnea on exertion, nausea, vomiting, abdominal pain, dysuria, hematuria, melena, numbness,  or tingling.   All other systems have been reviewed and were otherwise negative with the exception of those mentioned in the HPI and as above.    PHYSICAL EXAM: Filed Vitals:   06/10/14 1524  BP: 108/70  Pulse:  72  Temp: 99.6 F (37.6 C)  Resp: 16   Filed Vitals:   06/10/14 1524  Height: 5' 4.25" (1.632 m)  Weight: 220 lb (99.791 kg)   Body mass index is 37.47 kg/(m^2).  General: Alert, no acute distress HEENT:  Normocephalic, atraumatic, oropharynx patent. EOMI, PERRLA TM nl, erythematous throat, No exudates. + boggy nares, + sinus tenderness. Cardiovascular:  Regular rate and rhythm, no rubs murmurs or gallops.  No Carotid bruits, radial pulse intact. No pedal edema.  Respiratory: +wheezes, No rales, or rhonchi.  No cyanosis, no use of accessory musculature GI: No organomegaly, abdomen is soft and non-tender, positive bowel sounds.  No masses. Skin: No rashes. Neurologic: Facial musculature symmetric. Psychiatric: Patient is appropriate throughout our interaction. Lymphatic: No cervical lymphadenopathy Musculoskeletal: Gait intact.   LABS: Results  for orders placed in visit on 06/10/14  POCT URINE PREGNANCY      Result Value Ref Range   Preg Test, Ur Negative    POCT RAPID STREP A (OFFICE)      Result Value Ref Range   Rapid Strep A Screen Negative  Negative     EKG/XRAY:   Primary read interpreted by Dr. Marin Comment at [facility]. Neg for effusion, pneumo or infiltrates.  + bronchitic changes   ASSESSMENT/PLAN: Encounter Diagnoses  Name Primary?  . Cough   . Wheezing   . Acute pharyngitis, unspecified pharyngitis type   . Acute bronchitis, unspecified organism Yes  . Ethmoid sinusitis, unspecified chronicity   . Asthma, chronic, unspecified asthma severity, uncomplicated    Rx augmentin, tessalon perles, hycodan prn Rx Albuterol neb and inhaler, she states seh will use nebtreatment when she gets it at home, does not want to wait in office for treatemnt, also rx medrol dose pack Work note given F/u prn   Gross sideeffects, risk and benefits, and alternatives of medications d/w patient. Patient is aware that all medications have potential sideeffects and we are unable to predict every sideeffect or drug-drug interaction that may occur.  , [place], DO 06/10/2014 5:57 PM

## 2014-08-16 ENCOUNTER — Emergency Department (HOSPITAL_COMMUNITY): Payer: BC Managed Care – PPO

## 2014-08-16 ENCOUNTER — Encounter (HOSPITAL_COMMUNITY): Payer: Self-pay | Admitting: Emergency Medicine

## 2014-08-16 ENCOUNTER — Emergency Department (HOSPITAL_COMMUNITY)
Admission: EM | Admit: 2014-08-16 | Discharge: 2014-08-16 | Disposition: A | Discharge status: Home / Self Care | Payer: BC Managed Care – PPO | Attending: Emergency Medicine | Admitting: Emergency Medicine

## 2014-08-16 DIAGNOSIS — Y9354 Activity, bowling: Secondary | ICD-10-CM | POA: Diagnosis not present

## 2014-08-16 DIAGNOSIS — Z79899 Other long term (current) drug therapy: Secondary | ICD-10-CM | POA: Insufficient documentation

## 2014-08-16 DIAGNOSIS — W1830XA Fall on same level, unspecified, initial encounter: Secondary | ICD-10-CM | POA: Insufficient documentation

## 2014-08-16 DIAGNOSIS — Z8739 Personal history of other diseases of the musculoskeletal system and connective tissue: Secondary | ICD-10-CM | POA: Diagnosis not present

## 2014-08-16 DIAGNOSIS — Z791 Long term (current) use of non-steroidal anti-inflammatories (NSAID): Secondary | ICD-10-CM | POA: Insufficient documentation

## 2014-08-16 DIAGNOSIS — Y92838 Other recreation area as the place of occurrence of the external cause: Secondary | ICD-10-CM | POA: Insufficient documentation

## 2014-08-16 DIAGNOSIS — J45909 Unspecified asthma, uncomplicated: Secondary | ICD-10-CM | POA: Diagnosis not present

## 2014-08-16 DIAGNOSIS — Y998 Other external cause status: Secondary | ICD-10-CM | POA: Insufficient documentation

## 2014-08-16 DIAGNOSIS — S8001XA Contusion of right knee, initial encounter: Secondary | ICD-10-CM | POA: Diagnosis not present

## 2014-08-16 DIAGNOSIS — Z862 Personal history of diseases of the blood and blood-forming organs and certain disorders involving the immune mechanism: Secondary | ICD-10-CM | POA: Diagnosis not present

## 2014-08-16 DIAGNOSIS — M25579 Pain in unspecified ankle and joints of unspecified foot: Secondary | ICD-10-CM

## 2014-08-16 DIAGNOSIS — S99912A Unspecified injury of left ankle, initial encounter: Secondary | ICD-10-CM | POA: Diagnosis present

## 2014-08-16 DIAGNOSIS — G43909 Migraine, unspecified, not intractable, without status migrainosus: Secondary | ICD-10-CM | POA: Diagnosis not present

## 2014-08-16 DIAGNOSIS — S93402A Sprain of unspecified ligament of left ankle, initial encounter: Secondary | ICD-10-CM | POA: Diagnosis not present

## 2014-08-16 MED ORDER — HYDROMORPHONE HCL 1 MG/ML IJ SOLN
1.0000 mg | Freq: Once | INTRAMUSCULAR | Status: AC
  Administered 2014-08-16: 1 mg via INTRAMUSCULAR
  Filled 2014-08-16: qty 1

## 2014-08-16 MED ORDER — OXYCODONE-ACETAMINOPHEN 5-325 MG PO TABS: 1.0000 | ORAL_TABLET | ORAL | Status: DC | PRN

## 2014-08-16 MED ORDER — ONDANSETRON 4 MG PO TBDP
4.0000 mg | ORAL_TABLET | Freq: Once | ORAL | Status: AC
Start: 2014-08-16 — End: 2014-08-16
  Administered 2014-08-16: 4 mg via ORAL
  Filled 2014-08-16: qty 1

## 2014-08-16 MED ORDER — NAPROXEN 375 MG PO TABS: 375.0000 mg | ORAL_TABLET | Freq: Two times a day (BID) | ORAL | Status: DC

## 2014-08-16 NOTE — ED Provider Notes (Signed)
CSN: [id number]     Arrival date & time 08/16/14  0407 History   First MD Initiated Contact with Patient 08/16/14 0422     Chief Complaint  Patient presents with  . Ankle Pain  . Knee Pain     (Consider location/radiation/quality/duration/timing/severity/associated sxs/prior Treatment) HPI Comments: Pt complains of left ankle and right knee pain.  She was bowling last night and tripped, twisting her left ankle and coming straight down on her right knee.  She has had constant, throbbing pain to these areas since that time.  She did not hit her head.  She denies neck or back pain, or any other injuries.  Patient is a 44 y.o. female presenting with ankle pain and knee pain.  Ankle Pain Associated symptoms: no back pain, no fever and no neck pain   Knee Pain Associated symptoms: no back pain, no fever and no neck pain     Past Medical History  Diagnosis Date  . Asthma   . Migraines   . Bone spur 2008  . Anemia    Past Surgical History  Procedure Laterality Date  . Shoulder surgery Left 2008    Bone Spur removal  . Cholecystectomy    . Gastric bypass    . Knee surgery    . Cesarean section     Family History  Problem Relation Age of Onset  . Cancer Father     oral   History  Substance Use Topics  . Smoking status: Former Research scientist (life sciences)  . Smokeless tobacco: Never Used  . Alcohol Use: Yes     Comment: less than once a week-usually only holidays/special occasions   OB History    Gravida Para Term Preterm AB TAB SAB Ectopic Multiple Living   13    13  13   3      Review of Systems  Constitutional: Negative for fever.  Gastrointestinal: Negative for nausea and vomiting.  Musculoskeletal: Positive for joint swelling and arthralgias. Negative for back pain and neck pain.  Skin: Negative for wound.  Neurological: Negative for weakness, numbness and headaches.      Allergies  Review of patient's allergies indicates no known allergies.  Home Medications   Prior to  Admission medications   Medication Sig Start Date End Date Taking? Authorizing Provider  albuterol (PROVENTIL HFA;VENTOLIN HFA) 108 (90 BASE) MCG/ACT inhaler Inhale 2 puffs into the lungs every 6 (six) hours as needed for wheezing. 06/10/14  Yes Thao P Le, DO  albuterol (PROVENTIL) (2.5 MG/3ML) 0.083% nebulizer solution Take 3 mLs (2.5 mg total) by nebulization every 4 (four) hours as needed for wheezing or shortness of breath. 06/10/14  Yes Thao P Le, DO  SUMAtriptan (IMITREX) 100 MG tablet Take 100 mg by mouth every 2 (two) hours as needed for migraine (maximum 2 tablets peer 24 hours). May repeat in 2 hours if headache persists or recurs.   Yes Historical Provider, MD  amoxicillin-clavulanate (AUGMENTIN) 875-125 MG per tablet Take 1 tablet by mouth 2 (two) times daily. Patient not taking: Reported on 08/16/2014 06/10/14   Thao P Le, DO  benzonatate (TESSALON) 100 MG capsule Take 2 capsules (200 mg total) by mouth 2 (two) times daily as needed. Patient not taking: Reported on 08/16/2014 06/10/14   Thao P Le, DO  HYDROcodone-homatropine (HYCODAN) 5-1.5 MG/5ML syrup Take 5 mLs by mouth at bedtime as needed. Patient not taking: Reported on 08/16/2014 06/10/14   Thao P Le, DO  methylPREDNIsolone (MEDROL DOSPACK) 4 MG tablet follow package  directions Patient not taking: Reported on 08/16/2014 06/10/14   Thao P Le, DO  naproxen (NAPROSYN) 375 MG tablet Take 1 tablet (375 mg total) by mouth 2 (two) times daily. 08/16/14   Malvin Johns, MD  oxyCODONE-acetaminophen (PERCOCET) 5-325 MG per tablet Take 1-2 tablets by mouth every 4 (four) hours as needed. 08/16/14   Malvin Johns, MD   BP 101/59 mmHg  Pulse 70  Temp(Src) 97.6 F (36.4 C) (Oral)  Resp 20  Ht 5\' 4"  (1.626 m)  Wt 200 lb (90.719 kg)  BMI 34.31 kg/m2  SpO2 97%  LMP 07/22/2014 (Exact Date) Physical Exam  Constitutional: She is oriented to person, place, and time. She appears well-developed and well-nourished.  HENT:  Head: Normocephalic and  atraumatic.  Neck: Normal range of motion. Neck supple.  Cardiovascular: Normal rate.   Pulmonary/Chest: Effort normal.  Musculoskeletal: She exhibits edema and tenderness.  +swelling and ecchymosis over the anterior right knee with significant tenderness to this area.  No pain to hip or ankle. Pt able to do SLR.  NVI distally.  +TTP over lateral malleolus of left ankle.  No pain to foot or proximal fibula.  NVI distally.  No wounds  Neurological: She is alert and oriented to person, place, and time.  Skin: Skin is warm and dry.  Psychiatric: She has a normal mood and affect.    ED Course  Procedures (including critical care time) Labs Review Labs Reviewed - No data to display  Imaging Review Dg Ankle 2 Views Left  08/16/2014   CLINICAL DATA:  Right knee and left ankle pain after a fall.  EXAM: LEFT ANKLE - 2 VIEW  COMPARISON:  None.  FINDINGS: There is no evidence of fracture, dislocation, or joint effusion. There is no evidence of arthropathy or other focal bone abnormality. Soft tissues are unremarkable.  IMPRESSION: Negative.   Electronically Signed   By: Lucienne Capers M.D.   On: 08/16/2014 05:39   Dg Knee Complete 4 Views Right  08/16/2014   CLINICAL DATA:  Right knee and left ankle pain after [place].  EXAM: RIGHT KNEE - COMPLETE 4+ VIEW  COMPARISON:  Right knee 05/11/2006  FINDINGS: Mild degenerative changes with small osteophytes in the medial and lateral compartment. Mild compartment narrowing. 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 No significant effusion. No evidence of acute fracture or subluxation. No focal bone lesion or bone destruction. Bone cortex and trabecular architecture appear intact. No radiopaque soft tissue foreign bodies.  IMPRESSION: Mild degenerative changes.  No acute bony abnormalities.   Electronically Signed   By: Lucienne Capers M.D.   On: 08/16/2014 05:38     EKG Interpretation None      MDM   Final diagnoses:  Ankle  sprain, left, initial encounter  Knee contusion, right, initial encounter    No fractures seen.  Pt placed in an ASO and ace wrap around knee.  Referred to ortho if symptoms not improving.  Given rx for percocet and naprosyn as well as crutches.    Malvin Johns, MD 08/16/14 [phone]

## 2014-08-16 NOTE — Discharge Instructions (Signed)
Ankle Sprain  An ankle sprain is an injury to the strong, fibrous tissues (ligaments) that hold the bones of your ankle joint together.   CAUSES  An ankle sprain is usually caused by a fall or by twisting your ankle. Ankle sprains most commonly occur when you step on the outer edge of your foot, and your ankle turns inward. People who participate in sports are more prone to these types of injuries.   SYMPTOMS    Pain in your ankle. The pain may be present at rest or only when you are trying to stand or walk.   Swelling.   Bruising. Bruising may develop immediately or within 1 to 2 days after your injury.   Difficulty standing or walking, particularly when turning corners or changing directions.  DIAGNOSIS   Your caregiver will ask you details about your injury and perform a physical exam of your ankle to determine if you have an ankle sprain. During the physical exam, your caregiver will press on and apply pressure to specific areas of your foot and ankle. Your caregiver will try to move your ankle in certain ways. An X-ray exam may be done to be sure a bone was not broken or a ligament did not separate from one of the bones in your ankle (avulsion fracture).   TREATMENT   Certain types of braces can help stabilize your ankle. Your caregiver can make a recommendation for this. Your caregiver may recommend the use of medicine for pain. If your sprain is severe, your caregiver may refer you to a surgeon who helps to restore function to parts of your skeletal system (orthopedist) or a physical therapist.  HOME CARE INSTRUCTIONS    Apply ice to your injury for 1-2 days or as directed by your caregiver. Applying ice helps to reduce inflammation and pain.   Put ice in a plastic bag.   Place a towel between your skin and the bag.   Leave the ice on for 15-20 minutes at a time, every 2 hours while you are awake.   Only take over-the-counter or prescription medicines for pain, discomfort, or fever as directed by  your caregiver.   Elevate your injured ankle above the level of your heart as much as possible for 2-3 days.   If your caregiver recommends crutches, use them as instructed. Gradually put weight on the affected ankle. Continue to use crutches or a cane until you can walk without feeling pain in your ankle.   If you have a plaster splint, wear the splint as directed by your caregiver. Do not rest it on anything harder than a pillow for the first 24 hours. Do not put weight on it. Do not get it wet. You may take it off to take a shower or bath.   You may have been given an elastic bandage to wear around your ankle to provide support. If the elastic bandage is too tight (you have numbness or tingling in your foot or your foot becomes cold and blue), adjust the bandage to make it comfortable.   If you have an air splint, you may blow more air into it or let air out to make it more comfortable. You may take your splint off at night and before taking a shower or bath. Wiggle your toes in the splint several times per day to decrease swelling.  SEEK MEDICAL CARE IF:    You have rapidly increasing bruising or swelling.   Your toes feel extremely cold or   you lose feeling in your foot.   Your pain is not relieved with medicine.  SEEK IMMEDIATE MEDICAL CARE IF:   Your toes are numb or blue.   You have severe pain that is increasing.  MAKE SURE YOU:    Understand these instructions.   Will watch your condition.   Will get help right away if you are not doing well or get worse.  Document Released: 07/31/2005 Document Revised: 04/24/2012 Document Reviewed: 08/12/2011  ExitCare Patient Information 2015 [employer]. This information is not intended to replace advice given to you by your health care provider. Make sure you discuss any questions you have with your health care provider.  Contusion  A contusion is a deep bruise. Contusions are the result of an injury that caused bleeding under the skin. The contusion  may turn blue, purple, or yellow. Minor injuries will give you a painless contusion, but more severe contusions may stay painful and swollen for a few weeks.   CAUSES   A contusion is usually caused by a blow, trauma, or direct force to an area of the body.  SYMPTOMS    Swelling and redness of the injured area.   Bruising of the injured area.   Tenderness and soreness of the injured area.   Pain.  DIAGNOSIS   The diagnosis can be made by taking a history and physical exam. An X-ray, CT scan, or MRI may be needed to determine if there were any associated injuries, such as fractures.  TREATMENT   Specific treatment will depend on what area of the body was injured. In general, the best treatment for a contusion is resting, icing, elevating, and applying cold compresses to the injured area. Over-the-counter medicines may also be recommended for pain control. Ask your caregiver what the best treatment is for your contusion.  HOME CARE INSTRUCTIONS    Put ice on the injured area.   Put ice in a plastic bag.   Place a towel between your skin and the bag.   Leave the ice on for 15-20 minutes, 3-4 times a day, or as directed by your health care provider.   Only take over-the-counter or prescription medicines for pain, discomfort, or fever as directed by your caregiver. Your caregiver may recommend avoiding anti-inflammatory medicines (aspirin, ibuprofen, and naproxen) for 48 hours because these medicines may increase bruising.   Rest the injured area.   If possible, elevate the injured area to reduce swelling.  SEEK IMMEDIATE MEDICAL CARE IF:    You have increased bruising or swelling.   You have pain that is getting worse.   Your swelling or pain is not relieved with medicines.  MAKE SURE YOU:    Understand these instructions.   Will watch your condition.   Will get help right away if you are not doing well or get worse.  Document Released: 05/10/2005 Document Revised: 08/05/2013 Document Reviewed:  06/05/2011  [employer] Patient Information 2015 [employer]. This information is not intended to replace advice given to you by your health care provider. Make sure you discuss any questions you have with your health care provider.

## 2014-08-16 NOTE — ED Notes (Signed)
Pt adamantly refusing wheelchair, given crutches and information on ortho devices.

## 2014-08-16 NOTE — ED Notes (Signed)
Pt arrives with c/o R knee and L ankle pain [place], states her boot got caught on the rug.

## 2014-11-26 ENCOUNTER — Encounter (HOSPITAL_COMMUNITY): Payer: Self-pay | Admitting: *Deleted

## 2014-11-26 ENCOUNTER — Emergency Department (HOSPITAL_COMMUNITY)
Admission: EM | Admit: 2014-11-26 | Discharge: 2014-11-26 | Disposition: A | Discharge status: Home / Self Care | Payer: BLUE CROSS/BLUE SHIELD | Attending: Emergency Medicine | Admitting: Emergency Medicine

## 2014-11-26 DIAGNOSIS — Z7952 Long term (current) use of systemic steroids: Secondary | ICD-10-CM | POA: Insufficient documentation

## 2014-11-26 DIAGNOSIS — Z8739 Personal history of other diseases of the musculoskeletal system and connective tissue: Secondary | ICD-10-CM | POA: Diagnosis not present

## 2014-11-26 DIAGNOSIS — Z87891 Personal history of nicotine dependence: Secondary | ICD-10-CM | POA: Diagnosis not present

## 2014-11-26 DIAGNOSIS — J45909 Unspecified asthma, uncomplicated: Secondary | ICD-10-CM | POA: Insufficient documentation

## 2014-11-26 DIAGNOSIS — R51 Headache: Secondary | ICD-10-CM | POA: Diagnosis present

## 2014-11-26 DIAGNOSIS — Z862 Personal history of diseases of the blood and blood-forming organs and certain disorders involving the immune mechanism: Secondary | ICD-10-CM | POA: Insufficient documentation

## 2014-11-26 DIAGNOSIS — G43809 Other migraine, not intractable, without status migrainosus: Secondary | ICD-10-CM

## 2014-11-26 MED ORDER — SODIUM CHLORIDE 0.9 % IV BOLUS (SEPSIS)
1000.0000 mL | Freq: Once | INTRAVENOUS | Status: AC
  Administered 2014-11-26: 1000 mL via INTRAVENOUS

## 2014-11-26 MED ORDER — IBUPROFEN 600 MG PO TABS: 600.0000 mg | ORAL_TABLET | Freq: Four times a day (QID) | ORAL | Status: DC | PRN

## 2014-11-26 MED ORDER — METOCLOPRAMIDE HCL 5 MG/ML IJ SOLN
10.0000 mg | Freq: Once | INTRAMUSCULAR | Status: AC
  Administered 2014-11-26: 10 mg via INTRAVENOUS
  Filled 2014-11-26: qty 2

## 2014-11-26 MED ORDER — DEXAMETHASONE SODIUM PHOSPHATE 10 MG/ML IJ SOLN
10.0000 mg | Freq: Once | INTRAMUSCULAR | Status: AC
  Administered 2014-11-26: 10 mg via INTRAVENOUS
  Filled 2014-11-26: qty 1

## 2014-11-26 MED ORDER — KETOROLAC TROMETHAMINE 15 MG/ML IJ SOLN
15.0000 mg | Freq: Once | INTRAMUSCULAR | Status: AC
  Administered 2014-11-26: 15 mg via INTRAVENOUS
  Filled 2014-11-26: qty 1

## 2014-11-26 MED ORDER — DIPHENHYDRAMINE HCL 50 MG/ML IJ SOLN
25.0000 mg | Freq: Once | INTRAMUSCULAR | Status: AC
  Administered 2014-11-26: 25 mg via INTRAVENOUS
  Filled 2014-11-26: qty 1

## 2014-11-26 NOTE — Discharge Instructions (Signed)

## 2014-11-26 NOTE — ED Provider Notes (Signed)
CSN: [id number]     Arrival date & time 11/26/14  1514 History   First MD Initiated Contact with Patient 11/26/14 1632     Chief Complaint  Patient presents with  . Migraine     (Consider location/radiation/quality/duration/timing/severity/associated sxs/prior Treatment) HPI  This is a 44 yo female with PMH migraines, presenting today with HA.  Onset one month ago, was intermittent in the beginning.  Has been persistent for the past five days.  Located right side of the head.  Characterized as "pulling."  No medicines have been taken.  It is aggravated by loud noises, bright light.  Radiates throughout the right side of her head.  Positive for nausea, vomiting.  She states this is consistent with her past migraines. She regrets not having taken medicine.  Negative for focal weakness, numbness, or tingling.  Past Medical History  Diagnosis Date  . Asthma   . Migraines   . Bone spur 2008  . Anemia    Past Surgical History  Procedure Laterality Date  . Shoulder surgery Left 2008    Bone Spur removal  . Cholecystectomy    . Gastric bypass    . Knee surgery    . Cesarean section     Family History  Problem Relation Age of Onset  . Cancer Father     oral   History  Substance Use Topics  . Smoking status: Former Research scientist (life sciences)  . Smokeless tobacco: Never Used  . Alcohol Use: Yes     Comment: less than once a week-usually only holidays/special occasions   OB History    Gravida Para Term Preterm AB TAB SAB Ectopic Multiple Living   13    13  13   3      Review of Systems  Constitutional: Negative for fever and chills.  HENT: Negative for facial swelling.   Eyes: Negative for photophobia and pain.  Respiratory: Negative for cough and shortness of breath.   Cardiovascular: Negative for chest pain and leg swelling.  Gastrointestinal: Negative for nausea, vomiting and abdominal pain.  Genitourinary: Negative for dysuria.  Musculoskeletal: Negative for arthralgias.  Skin: Negative  for rash and wound.  Neurological: Positive for headaches.  Hematological: Negative for adenopathy.      Allergies  Review of patient's allergies indicates no known allergies.  Home Medications   Prior to Admission medications   Medication Sig Start Date End Date Taking? Authorizing Provider  albuterol (PROVENTIL HFA;VENTOLIN HFA) 108 (90 BASE) MCG/ACT inhaler Inhale 2 puffs into the lungs every 6 (six) hours as needed for wheezing. 06/10/14  Yes Thao P Le, DO  albuterol (PROVENTIL) (2.5 MG/3ML) 0.083% nebulizer solution Take 3 mLs (2.5 mg total) by nebulization every 4 (four) hours as needed for wheezing or shortness of breath. 06/10/14  Yes Thao P Le, DO  amoxicillin-clavulanate (AUGMENTIN) 875-125 MG per tablet Take 1 tablet by mouth 2 (two) times daily. Patient not taking: Reported on 08/16/2014 06/10/14   Thao P Le, DO  benzonatate (TESSALON) 100 MG capsule Take 2 capsules (200 mg total) by mouth 2 (two) times daily as needed. Patient not taking: Reported on 08/16/2014 06/10/14   Thao P Le, DO  HYDROcodone-homatropine (HYCODAN) 5-1.5 MG/5ML syrup Take 5 mLs by mouth at bedtime as needed. Patient not taking: Reported on 08/16/2014 06/10/14   Thao P Le, DO  methylPREDNIsolone (MEDROL DOSPACK) 4 MG tablet follow package directions Patient not taking: Reported on 08/16/2014 06/10/14   Thao P Le, DO  naproxen (NAPROSYN) 375 MG tablet  Take 1 tablet (375 mg total) by mouth 2 (two) times daily. Patient not taking: Reported on 11/26/2014 08/16/14   Malvin Johns, MD  oxyCODONE-acetaminophen (PERCOCET) 5-325 MG per tablet Take 1-2 tablets by mouth every 4 (four) hours as needed. Patient not taking: Reported on 11/26/2014 08/16/14   Malvin Johns, MD   BP 120/80 mmHg  Pulse 66  Temp(Src) 99 F (37.2 C) (Oral)  Resp 18  Ht 5\' 3"  (1.6 m)  Wt 205 lb (92.987 kg)  BMI 36.32 kg/m2  SpO2 100%  LMP 10/21/2014 (Approximate) Physical Exam  Constitutional: She is oriented to person, place, and time. She  appears well-developed and well-nourished. No distress.  HENT:  Head: Normocephalic and atraumatic.  Mouth/Throat: No oropharyngeal exudate.  Eyes: Conjunctivae are normal. Pupils are equal, round, and reactive to light. No scleral icterus.  Neck: Normal range of motion. No tracheal deviation present. No thyromegaly present.  Cardiovascular: Normal rate, regular rhythm and normal heart sounds.  Exam reveals no gallop and no friction rub.   No murmur heard. Pulmonary/Chest: Effort normal and breath sounds normal. No stridor. No respiratory distress. She has no wheezes. She has no rales. She exhibits no tenderness.  Abdominal: Soft. She exhibits no distension and no mass. There is no tenderness. There is no rebound and no guarding.  Musculoskeletal: Normal range of motion. She exhibits no edema.  Neurological: She is alert and oriented to person, place, and time. She has normal strength. No cranial nerve deficit or sensory deficit. Coordination and gait normal. GCS eye subscore is 4. GCS verbal subscore is 5. GCS motor subscore is 6.  Reflex Scores:      Patellar reflexes are 2+ on the right side and 2+ on the left side. Skin: Skin is warm and dry. She is not diaphoretic.    ED Course  Procedures (including critical care time)   MDM   Final diagnoses:  Other migraine without status migrainosus, not intractable    This is a 44 yo female with PMH migraines, presenting today with HA.  Onset one month ago, was intermittent in the beginning.  Has been persistent for the past five days.  Located right side of the head.  Characterized as "pulling."  No medicines have been taken.  It is aggravated by loud noises, bright light.  Radiates throughout the right side of her head.  Positive for nausea, vomiting.  She states this is consistent with her past migraines. She regrets not having taken medicine.  Negative for focal weakness, numbness, or tingling.  Exam is WNL, with no focal neuro deficits, no  meningeal signs.  Pt is UTD on vaccines, has no neck rigidity.  She has no rash.  Presentation is not consistent with ICH, CVA, dissection, aneurysm, meningitis, venous sinus thrombosis.  This is a typical presentation for migraines.  Will administer migraine cocktail, monitor the patient closely, reevaluate shortly.  Symptoms alleviated with cocktail.  Pt stable for discharge, FU c pcp.  All questions answered.  Return precautions given.  I have discussed case and care has been guided by my attending physician, Dr. Sabra Heck.  Doy Hutching, MD 11/26/14 1931  Noemi Chapel, MD 11/27/14 [phone]

## 2014-11-26 NOTE — ED Notes (Signed)
Pt c/o migraine for a month. Pt reports photophobia, blurry vision, n/v with migraine.

## 2014-12-12 ENCOUNTER — Emergency Department (HOSPITAL_COMMUNITY)
Admission: EM | Admit: 2014-12-12 | Discharge: 2014-12-12 | Disposition: A | Discharge status: Home / Self Care | Payer: BLUE CROSS/BLUE SHIELD | Attending: Emergency Medicine | Admitting: Emergency Medicine

## 2014-12-12 ENCOUNTER — Encounter (HOSPITAL_COMMUNITY): Payer: Self-pay | Admitting: Emergency Medicine

## 2014-12-12 ENCOUNTER — Emergency Department (HOSPITAL_COMMUNITY): Payer: BLUE CROSS/BLUE SHIELD

## 2014-12-12 DIAGNOSIS — M545 Low back pain, unspecified: Secondary | ICD-10-CM

## 2014-12-12 DIAGNOSIS — Z8679 Personal history of other diseases of the circulatory system: Secondary | ICD-10-CM | POA: Diagnosis not present

## 2014-12-12 DIAGNOSIS — J45909 Unspecified asthma, uncomplicated: Secondary | ICD-10-CM | POA: Diagnosis not present

## 2014-12-12 DIAGNOSIS — Z87891 Personal history of nicotine dependence: Secondary | ICD-10-CM | POA: Insufficient documentation

## 2014-12-12 DIAGNOSIS — Z862 Personal history of diseases of the blood and blood-forming organs and certain disorders involving the immune mechanism: Secondary | ICD-10-CM | POA: Diagnosis not present

## 2014-12-12 MED ORDER — KETOROLAC TROMETHAMINE 30 MG/ML IJ SOLN
60.0000 mg | Freq: Once | INTRAMUSCULAR | Status: AC
  Administered 2014-12-12: 60 mg via INTRAMUSCULAR
  Filled 2014-12-12: qty 2

## 2014-12-12 MED ORDER — HYDROCODONE-ACETAMINOPHEN 5-325 MG PO TABS: 1.0000 | ORAL_TABLET | Freq: Four times a day (QID) | ORAL | Status: DC | PRN

## 2014-12-12 NOTE — ED Notes (Addendum)
Pt c/o back pain onset Monday Morning, when she was unable to get out of bed. Pt reports pain with sitting. Pt denies recent injury. Pt seen at urgent care for same reports prescribed medication not working (toradol and skelaxin)

## 2014-12-12 NOTE — Discharge Instructions (Signed)
Back Pain, Adult °Back pain is very common. The pain often gets better over time. The cause of back pain is usually not dangerous. Most people can learn to manage their back pain on their own.  °HOME CARE  °· Stay active. Start with short walks on flat ground if you can. Try to walk farther each day. °· Do not sit, drive, or stand in one place for more than 30 minutes. Do not stay in bed. °· Do not avoid exercise or work. Activity can help your back heal faster. °· Be careful when you bend or lift an object. Bend at your knees, keep the object close to you, and do not twist. °· Sleep on a firm mattress. Lie on your side, and bend your knees. If you lie on your back, put a pillow under your knees. °· Only take medicines as told by your doctor. °· Put ice on the injured area. °¨ Put ice in a plastic bag. °¨ Place a towel between your skin and the bag. °¨ Leave the ice on for 15-20 minutes, 03-04 times a day for the first 2 to 3 days. After that, you can switch between ice and heat packs. °· Ask your doctor about back exercises or massage. °· Avoid feeling anxious or stressed. Find good ways to deal with stress, such as exercise. °GET HELP RIGHT AWAY IF:  °· Your pain does not go away with rest or medicine. °· Your pain does not go away in 1 week. °· You have new problems. °· You do not feel well. °· The pain spreads into your legs. °· You cannot control when you poop (bowel movement) or pee (urinate). °· Your arms or legs feel weak or lose feeling (numbness). °· You feel sick to your stomach (nauseous) or throw up (vomit). °· You have belly (abdominal) pain. °· You feel like you may pass out (faint). °MAKE SURE YOU:  °· Understand these instructions. °· Will watch your condition. °· Will get help right away if you are not doing well or get worse. °Document Released: 01/17/2008 Document Revised: 10/23/2011 Document Reviewed: 12/02/2013 °[employer]® Patient Information ©2015 [employer]. This information is not intended  to replace advice given to you by your health care provider. Make sure you discuss any questions you have with your health care provider. ° °

## 2014-12-12 NOTE — ED Provider Notes (Signed)
CSN: [id number]     Arrival date & time 12/12/14  1442 History  This chart was scribed for Glendell Docker, NP, working with Davonna Belling, MD by Starleen Arms, ED Scribe. This patient was seen in room TR09C/TR09C and the patient's care was started at 3:48 PM.   Chief Complaint  Patient presents with  . Back Pain   The history is provided by the patient. No language interpreter was used.   HPI Comments: Yesenia Wheeler is a 44 y.o. female who presents to the Emergency Department complaining of back pain worse with walking onset 6 days ago as the patient awoke without injury.  She has been seen at Urgent Care twice where she received Toradol, Skelaxin, and hydrocodone.  She reports the Toradol afforded transient relief and she has run out of the hydrocodone.  She did not have any imaging done and was told she would need to follow-up with an orthopaedist but did not receive a referral.  She reports her history of back pain began last October after a twisting injury at work.  Patient denies injury, bowel/bladder incontinence.   Past Medical History  Diagnosis Date  . Asthma   . Migraines   . Bone spur 2008  . Anemia    Past Surgical History  Procedure Laterality Date  . Shoulder surgery Left 2008    Bone Spur removal  . Cholecystectomy    . Gastric bypass    . Knee surgery    . Cesarean section     Family History  Problem Relation Age of Onset  . Cancer Father     oral   History  Substance Use Topics  . Smoking status: Former Research scientist (life sciences)  . Smokeless tobacco: Never Used  . Alcohol Use: Yes     Comment: less than once a week-usually only holidays/special occasions   OB History    Gravida Para Term Preterm AB TAB SAB Ectopic Multiple Living   13    13  13   3      Review of Systems  All other systems reviewed and are negative.     Allergies  Review of patient's allergies indicates no known allergies.  Home Medications   Prior to Admission medications    Medication Sig Start Date End Date Taking? Authorizing Provider  albuterol (PROVENTIL HFA;VENTOLIN HFA) 108 (90 BASE) MCG/ACT inhaler Inhale 2 puffs into the lungs every 6 (six) hours as needed for wheezing. 06/10/14   Thao P Le, DO  albuterol (PROVENTIL) (2.5 MG/3ML) 0.083% nebulizer solution Take 3 mLs (2.5 mg total) by nebulization every 4 (four) hours as needed for wheezing or shortness of breath. 06/10/14   Thao P Le, DO  amoxicillin-clavulanate (AUGMENTIN) 875-125 MG per tablet Take 1 tablet by mouth 2 (two) times daily. Patient not taking: Reported on 08/16/2014 06/10/14   Thao P Le, DO  benzonatate (TESSALON) 100 MG capsule Take 2 capsules (200 mg total) by mouth 2 (two) times daily as needed. Patient not taking: Reported on 08/16/2014 06/10/14   Thao P Le, DO  HYDROcodone-homatropine (HYCODAN) 5-1.5 MG/5ML syrup Take 5 mLs by mouth at bedtime as needed. Patient not taking: Reported on 08/16/2014 06/10/14   Thao P Le, DO  ibuprofen (ADVIL,MOTRIN) 600 MG tablet Take 1 tablet (600 mg total) by mouth every 6 (six) hours as needed. 11/26/14   Doy Hutching, MD  methylPREDNIsolone (MEDROL DOSPACK) 4 MG tablet follow package directions Patient not taking: Reported on 08/16/2014 06/10/14   Thao P Le, DO  naproxen (NAPROSYN) 375 MG tablet Take 1 tablet (375 mg total) by mouth 2 (two) times daily. Patient not taking: Reported on 11/26/2014 08/16/14   Malvin Johns, MD  oxyCODONE-acetaminophen (PERCOCET) 5-325 MG per tablet Take 1-2 tablets by mouth every 4 (four) hours as needed. Patient not taking: Reported on 11/26/2014 08/16/14   Malvin Johns, MD   BP 117/69 mmHg  Pulse 85  Temp(Src) 98.2 F (36.8 C) (Oral)  Resp 18  Ht 5\' 2"  (1.575 m)  Wt 215 lb (97.523 kg)  BMI 39.31 kg/m2  SpO2 100%  LMP 11/27/2014 Physical Exam  Constitutional: She is oriented to person, place, and time. She appears well-developed and well-nourished. No distress.  HENT:  Head: Normocephalic and atraumatic.  Eyes:  Conjunctivae and EOM are normal.  Neck: Neck supple. No tracheal deviation present.  Cardiovascular: Normal rate.   Pulmonary/Chest: Effort normal. No respiratory distress.  Musculoskeletal: Normal range of motion.  Generalized tenderness with palpation. Good sensation and strength to bilateral lower extremities.  Neurological: She is alert and oriented to person, place, and time. She exhibits normal muscle tone. Coordination normal.  Skin: Skin is warm and dry.  Psychiatric: She has a normal mood and affect. Her behavior is normal.  Nursing note and vitals reviewed.   ED Course  Procedures (including critical care time)  DIAGNOSTIC STUDIES: Oxygen Saturation is 100% on RA, normal by my interpretation.    COORDINATION OF CARE:  3:54 PM Discussed treatment plan with patient at bedside.  Patient acknowledges and agrees with plan.    Labs Review Labs Reviewed - No data to display  Imaging Review Dg Lumbar Spine Complete  12/12/2014   CLINICAL DATA:  Low back pain, increased since Monday, prior injury in October  EXAM: [place] 4+ VIEW  COMPARISON:  12/29/2013  FINDINGS: Five lumbar type vertebral bodies.  Normal lumbar lordosis.  No evidence of fracture or dislocation. Vertebral body heights and intervertebral disc spaces are maintained.  Mild degenerative changes at L4-5.  Surgical clips in the bilateral upper abdomen. Surgical sutures in the left upper abdomen.  IMPRESSION: No fracture or dislocation is seen.  Mild degenerative changes at L4-5.   Electronically Signed   By: Julian Hy M.D.   On: 12/12/2014 17:25     EKG Interpretation None      MDM   Final diagnoses:  Bilateral low back pain without sciatica    Pt given hydrocodone for pain  I personally performed the services described in this documentation, which was scribed in my presence. The recorded information has been reviewed and is accurate.    Glendell Docker, NP 12/13/14 [id number]  Davonna Belling, MD 12/13/14 [phone]

## 2014-12-12 NOTE — ED Notes (Signed)
Pt called for triage x2 

## 2014-12-12 NOTE — ED Notes (Signed)
Pt reports going to [facility] on Monday but meds given are not working.

## 2014-12-12 NOTE — ED Notes (Signed)
Call to triage x 1 no response 

## 2015-02-06 ENCOUNTER — Emergency Department (HOSPITAL_COMMUNITY)
Admission: EM | Admit: 2015-02-06 | Discharge: 2015-02-07 | Disposition: A | Discharge status: Home / Self Care | Payer: BLUE CROSS/BLUE SHIELD | Attending: Emergency Medicine | Admitting: Emergency Medicine

## 2015-02-06 ENCOUNTER — Encounter (HOSPITAL_COMMUNITY): Payer: Self-pay | Admitting: *Deleted

## 2015-02-06 DIAGNOSIS — M79661 Pain in right lower leg: Secondary | ICD-10-CM | POA: Diagnosis present

## 2015-02-06 DIAGNOSIS — Z862 Personal history of diseases of the blood and blood-forming organs and certain disorders involving the immune mechanism: Secondary | ICD-10-CM | POA: Diagnosis not present

## 2015-02-06 DIAGNOSIS — Z86718 Personal history of other venous thrombosis and embolism: Secondary | ICD-10-CM | POA: Insufficient documentation

## 2015-02-06 DIAGNOSIS — Z8679 Personal history of other diseases of the circulatory system: Secondary | ICD-10-CM | POA: Diagnosis not present

## 2015-02-06 DIAGNOSIS — Z87891 Personal history of nicotine dependence: Secondary | ICD-10-CM | POA: Diagnosis not present

## 2015-02-06 DIAGNOSIS — J45909 Unspecified asthma, uncomplicated: Secondary | ICD-10-CM | POA: Diagnosis not present

## 2015-02-06 DIAGNOSIS — M79604 Pain in right leg: Secondary | ICD-10-CM

## 2015-02-06 DIAGNOSIS — I82401 Acute embolism and thrombosis of unspecified deep veins of right lower extremity: Secondary | ICD-10-CM | POA: Diagnosis not present

## 2015-02-06 LAB — CBC
HCT: 35.4 % — ABNORMAL LOW (ref 36.0–46.0)
Hemoglobin: 11.7 g/dL — ABNORMAL LOW (ref 12.0–15.0)
MCH: 27.9 pg (ref 26.0–34.0)
MCHC: 33.1 g/dL (ref 30.0–36.0)
MCV: 84.5 fL (ref 78.0–100.0)
PLATELETS: 203 10*3/uL (ref 150–400)
RBC: 4.19 MIL/uL (ref 3.87–5.11)
RDW: 15.3 % (ref 11.5–15.5)
WBC: 7 10*3/uL (ref 4.0–10.5)

## 2015-02-06 LAB — PROTIME-INR
INR: 1.1 (ref 0.00–1.49)
PROTHROMBIN TIME: 14.4 s (ref 11.6–15.2)

## 2015-02-06 LAB — BASIC METABOLIC PANEL
ANION GAP: 9 (ref 5–15)
BUN: 10 mg/dL (ref 6–20)
CHLORIDE: 110 mmol/L (ref 101–111)
CO2: 20 mmol/L — ABNORMAL LOW (ref 22–32)
CREATININE: 0.81 mg/dL (ref 0.44–1.00)
Calcium: 8.2 mg/dL — ABNORMAL LOW (ref 8.9–10.3)
GFR calc Af Amer: >60 mL/min (ref >60)
GFR calc non Af Amer: >60 mL/min (ref >60)
GLUCOSE: 89 mg/dL (ref 65–99)
POTASSIUM: 3.8 mmol/L (ref 3.5–5.1)
Sodium: 139 mmol/L (ref 135–145)

## 2015-02-06 NOTE — ED Notes (Signed)
Dr. Cardama at the bedside.  

## 2015-02-06 NOTE — ED Provider Notes (Signed)
CSN: [id number]     Arrival date & time 02/06/15  2048 History   First MD Initiated Contact with Patient 02/06/15 2151     Chief Complaint  Patient presents with  . Leg Pain     (Consider location/radiation/quality/duration/timing/severity/associated sxs/prior Treatment) Patient is a 44 y.o. female presenting with leg pain.  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 Dislocation: no   Relieved by:  None tried Worsened by:  Bearing weight Ineffective treatments:  None tried Associated symptoms: no back pain, no decreased ROM, no fatigue, no fever, no itching, no muscle weakness, no neck pain, no numbness, no stiffness, no swelling and no tingling   Risk factors comment:  Prior DVTs   Past Medical History  Diagnosis Date  . Asthma   . Migraines   . Bone spur 2008  . Anemia   . DVT (deep venous thrombosis)    Past Surgical History  Procedure Laterality Date  . Shoulder surgery Left 2008    Bone Spur removal  . Cholecystectomy    . Gastric bypass    . Knee surgery    . Cesarean section     Family History  Problem Relation Age of Onset  . Cancer Father     oral   History  Substance Use Topics  . Smoking status: Former Research scientist (life sciences)  . Smokeless tobacco: Never Used  . Alcohol Use: Yes     Comment: less than once a week-usually only holidays/special occasions   OB History    Gravida Para Term Preterm AB TAB SAB Ectopic Multiple Living   13    13  13   3      Review of Systems  Constitutional: Negative for fever, chills, appetite change and fatigue.  HENT: Negative for congestion, ear pain, facial swelling, mouth sores and sore throat.   Eyes: Negative for visual disturbance.  Respiratory: Negative for  cough, chest tightness and shortness of breath.   Cardiovascular: Negative for chest pain and palpitations.  Gastrointestinal: Negative for nausea, vomiting, abdominal pain, diarrhea and blood in stool.  Endocrine: Negative for cold intolerance and heat intolerance.  Genitourinary: Negative for frequency, decreased urine volume and difficulty urinating.  Musculoskeletal: Negative for back pain, stiffness, neck pain and neck stiffness.  Skin: Negative for itching and rash.  Neurological: Negative for dizziness, weakness, light-headedness and headaches.  All other systems reviewed and are negative.     Allergies  Review of patient's allergies indicates no known allergies.  Home Medications   Prior to Admission medications   Medication Sig Start Date End Date Taking? Authorizing Provider  albuterol (PROVENTIL HFA;VENTOLIN HFA) 108 (90 BASE) MCG/ACT inhaler Inhale 2 puffs into the lungs every 6 (six) hours as needed for wheezing. 06/10/14   Thao P Le, DO  albuterol (PROVENTIL) (2.5 MG/3ML) 0.083% nebulizer solution Take 3 mLs (2.5 mg total) by nebulization every 4 (four) hours as needed for wheezing or shortness of breath. 06/10/14   Thao P Le, DO  amoxicillin-clavulanate (AUGMENTIN) 875-125 MG per tablet Take 1 tablet by mouth 2 (two) times daily. Patient not taking: Reported on 08/16/2014 06/10/14   Thao P Le, DO  benzonatate (TESSALON) 100 MG capsule Take 2 capsules (200 mg total) by mouth 2 (two) times daily as needed. Patient not taking: Reported on 08/16/2014 06/10/14   Thao P Le, DO  HYDROcodone-acetaminophen (NORCO/VICODIN) 5-325 MG per tablet Take 1-2 tablets by mouth every 6 (six) hours as needed. 12/12/14   Glendell Docker, NP  HYDROcodone-homatropine (HYCODAN) 5-1.5 MG/5ML syrup Take 5 mLs by mouth at bedtime as needed. Patient not taking: Reported on 08/16/2014 06/10/14   Thao P Le, DO  ibuprofen (ADVIL,MOTRIN) 600 MG tablet Take 1 tablet (600 mg total) by mouth every 6 (six) hours as  needed. 11/26/14   Doy Hutching, MD  methylPREDNIsolone (MEDROL DOSPACK) 4 MG tablet follow package directions Patient not taking: Reported on 08/16/2014 06/10/14   Thao P Le, DO  naproxen (NAPROSYN) 375 MG tablet Take 1 tablet (375 mg total) by mouth 2 (two) times daily. Patient not taking: Reported on 11/26/2014 08/16/14   Malvin Johns, MD  oxyCODONE-acetaminophen (PERCOCET) 5-325 MG per tablet Take 1-2 tablets by mouth every 4 (four) hours as needed. Patient not taking: Reported on 11/26/2014 08/16/14   Malvin Johns, MD   BP 104/54 mmHg  Pulse 77  Temp(Src) 98 F (36.7 C) (Oral)  Resp 18  Ht 5\' 4"  (1.626 m)  Wt 230 lb 1 oz (104.356 kg)  BMI 39.47 kg/m2  SpO2 98%  LMP 01/25/2015 Physical Exam  Constitutional: She is oriented to person, place, and time. She appears well-developed and well-nourished. No distress.  HENT:  Head: Normocephalic and atraumatic.  Right Ear: External ear normal.  Left Ear: External ear normal.  Nose: Nose normal.  Eyes: Conjunctivae and EOM are normal. Pupils are equal, round, and reactive to light. Right eye exhibits no discharge. Left eye exhibits no discharge. No scleral icterus.  Neck: Normal range of motion. Neck supple.  Cardiovascular: Normal rate, regular rhythm and normal heart sounds.  Exam reveals no gallop and no friction rub.   No murmur heard. Pulmonary/Chest: Effort normal and breath sounds normal. No stridor. No respiratory distress. She has no wheezes.  Abdominal: Soft. She exhibits no distension. There is no tenderness.  Musculoskeletal: She exhibits no edema.       Right lower leg: She exhibits tenderness (right calf tenderness to palpation.).  Diameter of left lower extremity greater than right. No evidence of swelling or edema.  Neurological: She is alert and oriented to person, place, and time.  Skin: Skin is warm and dry. No rash noted. She is not diaphoretic. No erythema.  Psychiatric: She has a normal mood and affect.    ED Course   Procedures (including critical care time) Labs Review Labs Reviewed  CBC - Abnormal; Notable for the following:    Hemoglobin 11.7 (*)    HCT 35.4 (*)    All other components within normal limits  BASIC METABOLIC PANEL - Abnormal; Notable for the following:    CO2 20 (*)    Calcium 8.2 (*)    All other components within normal limits  D-DIMER, QUANTITATIVE (NOT AT [facility]) - Abnormal; Notable for the following:    D-Dimer, Quant 2.41 (*)    All other components within normal limits  PROTIME-INR    Imaging Review Dg Knee 2 Views Right  02/07/2015   CLINICAL DATA:  Acute onset of right calf pain for 3 days. Initial encounter.  EXAM: RIGHT KNEE - 1-2 VIEW  COMPARISON:  Right knee radiographs performed 08/16/2014  FINDINGS: There is no evidence of fracture or dislocation. The  joint spaces are preserved. Small tibial spine and wall osteophytes are seen; the patellofemoral joint is grossly unremarkable in appearance. An accessory ossicle is noted at the distal patellar tendon.  A small knee joint effusion is noted. The visualized soft tissues are otherwise unremarkable in appearance.  IMPRESSION: 1. No evidence of fracture or dislocation. 2. Small knee joint effusion noted.   Electronically Signed   By: Garald Balding M.D.   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 No trauma or recent falls. Rest of the history and exam as above. D-dimer positive at 2.4. Other labs unremarkable. Plain film without evidence of acute fracture did show small effusion. Patient was given Lovenox in the ED and instructed to return tomorrow morning for lower extremity Doppler study to assess for possible DVT. Patient understood and agreed with the plan. She is in good condition is stable for discharge with strict return precautions.  She was seen in conjunction with Dr. Tawnya Crook.  Final diagnoses:  Right  leg pain        Addison Lank, MD 02/07/15 0086  Ernestina Patches, MD 02/09/15 1158

## 2015-02-06 NOTE — ED Notes (Signed)
IV attempt X1. Pt stated she is hard stick and usually requires Ultrasound/IV team. Mica RN at bedside to attempt

## 2015-02-06 NOTE — ED Notes (Signed)
Phlebotomy at bedside.

## 2015-02-06 NOTE — ED Notes (Signed)
Pt in c/o pain to her right calf for the last three days, pain is constant but is worse with movement, denies swelling or redness, history of DVT, no longer on blood thinner

## 2015-02-07 ENCOUNTER — Emergency Department (HOSPITAL_COMMUNITY): Payer: BLUE CROSS/BLUE SHIELD

## 2015-02-07 ENCOUNTER — Encounter (HOSPITAL_COMMUNITY): Payer: Self-pay | Admitting: Emergency Medicine

## 2015-02-07 ENCOUNTER — Emergency Department (HOSPITAL_COMMUNITY)
Admission: EM | Admit: 2015-02-07 | Discharge: 2015-02-07 | Disposition: A | Discharge status: Home / Self Care | Payer: BLUE CROSS/BLUE SHIELD | Attending: Emergency Medicine | Admitting: Emergency Medicine

## 2015-02-07 ENCOUNTER — Ambulatory Visit (HOSPITAL_COMMUNITY)
Admission: RE | Admit: 2015-02-07 | Discharge: 2015-02-07 | Disposition: A | Discharge status: Home / Self Care | Payer: BLUE CROSS/BLUE SHIELD | Source: Ambulatory Visit | Attending: Emergency Medicine | Admitting: Emergency Medicine

## 2015-02-07 ENCOUNTER — Encounter (HOSPITAL_COMMUNITY): Payer: Self-pay

## 2015-02-07 DIAGNOSIS — Z8679 Personal history of other diseases of the circulatory system: Secondary | ICD-10-CM | POA: Insufficient documentation

## 2015-02-07 DIAGNOSIS — Z862 Personal history of diseases of the blood and blood-forming organs and certain disorders involving the immune mechanism: Secondary | ICD-10-CM | POA: Diagnosis not present

## 2015-02-07 DIAGNOSIS — M79609 Pain in unspecified limb: Secondary | ICD-10-CM | POA: Diagnosis not present

## 2015-02-07 DIAGNOSIS — Z79899 Other long term (current) drug therapy: Secondary | ICD-10-CM | POA: Diagnosis not present

## 2015-02-07 DIAGNOSIS — I82401 Acute embolism and thrombosis of unspecified deep veins of right lower extremity: Secondary | ICD-10-CM

## 2015-02-07 DIAGNOSIS — Z87891 Personal history of nicotine dependence: Secondary | ICD-10-CM | POA: Insufficient documentation

## 2015-02-07 DIAGNOSIS — J45909 Unspecified asthma, uncomplicated: Secondary | ICD-10-CM | POA: Diagnosis not present

## 2015-02-07 DIAGNOSIS — M79604 Pain in right leg: Secondary | ICD-10-CM | POA: Insufficient documentation

## 2015-02-07 DIAGNOSIS — Z86718 Personal history of other venous thrombosis and embolism: Secondary | ICD-10-CM | POA: Diagnosis not present

## 2015-02-07 DIAGNOSIS — Z8739 Personal history of other diseases of the musculoskeletal system and connective tissue: Secondary | ICD-10-CM | POA: Insufficient documentation

## 2015-02-07 DIAGNOSIS — M79605 Pain in left leg: Secondary | ICD-10-CM | POA: Diagnosis present

## 2015-02-07 LAB — D-DIMER, QUANTITATIVE: D-Dimer, Quant: 2.41 ug/mL-FEU — ABNORMAL HIGH (ref 0.00–0.48)

## 2015-02-07 MED ORDER — XARELTO VTE STARTER PACK 15 & 20 MG PO TBPK: 15.0000 mg | ORAL_TABLET | ORAL | Status: DC

## 2015-02-07 MED ORDER — HYDROCODONE-ACETAMINOPHEN 5-325 MG PO TABS
2.0000 | ORAL_TABLET | Freq: Once | ORAL | Status: AC
  Administered 2015-02-07: 2 via ORAL
  Filled 2015-02-07: qty 2

## 2015-02-07 MED ORDER — HYDROCODONE-ACETAMINOPHEN 5-325 MG PO TABS: 2.0000 | ORAL_TABLET | ORAL | Status: DC | PRN

## 2015-02-07 MED ORDER — RIVAROXABAN (XARELTO) EDUCATION KIT FOR DVT/PE PATIENTS
PACK | Freq: Once | Status: AC
  Administered 2015-02-07: 14:00:00
  Filled 2015-02-07: qty 1

## 2015-02-07 MED ORDER — ACETAMINOPHEN 500 MG PO TABS
1000.0000 mg | ORAL_TABLET | Freq: Once | ORAL | Status: AC
  Administered 2015-02-07: 1000 mg via ORAL
  Filled 2015-02-07: qty 2

## 2015-02-07 MED ORDER — ENOXAPARIN SODIUM 100 MG/ML ~~LOC~~ SOLN
100.0000 mg | Freq: Once | SUBCUTANEOUS | Status: AC
  Administered 2015-02-07: 100 mg via SUBCUTANEOUS
  Filled 2015-02-07: qty 1

## 2015-02-07 MED ORDER — RIVAROXABAN 15 MG PO TABS
15.0000 mg | ORAL_TABLET | Freq: Once | ORAL | Status: AC
  Administered 2015-02-07: 15 mg via ORAL
  Filled 2015-02-07: qty 1

## 2015-02-07 NOTE — ED Notes (Signed)
Called main lab, spoke to [place], in regards to adding on d-dimer.

## 2015-02-07 NOTE — Discharge Instructions (Signed)

## 2015-02-07 NOTE — ED Provider Notes (Signed)
CSN: [id number]     Arrival date & time 02/07/15  1201 History   First MD Initiated Contact with Patient 02/07/15 1208     Chief Complaint  Patient presents with  . DVT     (Consider location/radiation/quality/duration/timing/severity/associated sxs/prior Treatment) HPI Comments: The patient was seen and evaluated this morning for DVT in the right lower extremity, pharmacy would not fill her Xarelto without prior authorization from the insurance company. She returns to the ER because of leg pain stating that she will just get her medication filled tomorrow after her family doctor's office opens up   Past Medical History  Diagnosis Date  . Asthma   . Migraines   . Bone spur 2008  . Anemia   . DVT (deep venous thrombosis)    Past Surgical History  Procedure Laterality Date  . Shoulder surgery Left 2008    Bone Spur removal  . Cholecystectomy    . Gastric bypass    . Knee surgery    . Cesarean section     Family History  Problem Relation Age of Onset  . Cancer Father     oral   History  Substance Use Topics  . Smoking status: Former Research scientist (life sciences)  . Smokeless tobacco: Never Used  . Alcohol Use: Yes     Comment: less than once a week-usually only holidays/special occasions   OB History    Gravida Para Term Preterm AB TAB SAB Ectopic Multiple Living   '13    13  13   3     ' Review of Systems  Constitutional: Negative for fever.  Musculoskeletal: Positive for myalgias.  Skin: Negative for rash and wound.      Allergies  Review of patient's allergies indicates no known allergies.  Home Medications   Prior to Admission medications   Medication Sig Start Date End Date Taking? Authorizing Provider  albuterol (PROVENTIL HFA;VENTOLIN HFA) 108 (90 BASE) MCG/ACT inhaler Inhale 2 puffs into the lungs every 6 (six) hours as needed for wheezing. 06/10/14   Thao P Le, DO  albuterol (PROVENTIL) (2.5 MG/3ML) 0.083% nebulizer solution Take 3 mLs (2.5 mg total) by nebulization every 4  (four) hours as needed for wheezing or shortness of breath. 06/10/14   Thao P Le, DO  amoxicillin-clavulanate (AUGMENTIN) 875-125 MG per tablet Take 1 tablet by mouth 2 (two) times daily. Patient not taking: Reported on 08/16/2014 06/10/14   Thao P Le, DO  benzonatate (TESSALON) 100 MG capsule Take 2 capsules (200 mg total) by mouth 2 (two) times daily as needed. Patient not taking: Reported on 08/16/2014 06/10/14   Thao P Le, DO  HYDROcodone-acetaminophen (NORCO/VICODIN) 5-325 MG per tablet Take 2 tablets by mouth every 4 (four) hours as needed. 02/07/15   Noemi Chapel, MD  HYDROcodone-homatropine [facility]) 5-1.5 MG/5ML syrup Take 5 mLs by mouth at bedtime as needed. Patient not taking: Reported on 08/16/2014 06/10/14   Thao P Le, DO  ibuprofen (ADVIL,MOTRIN) 600 MG tablet Take 1 tablet (600 mg total) by mouth every 6 (six) hours as needed. 11/26/14   Doy Hutching, MD  methylPREDNIsolone (MEDROL DOSPACK) 4 MG tablet follow package directions Patient not taking: Reported on 08/16/2014 06/10/14   Thao P Le, DO  naproxen (NAPROSYN) 375 MG tablet Take 1 tablet (375 mg total) by mouth 2 (two) times daily. Patient not taking: Reported on 11/26/2014 08/16/14   Malvin Johns, MD  oxyCODONE-acetaminophen (PERCOCET) 5-325 MG per tablet Take 1-2 tablets by mouth every 4 (four) hours as needed. Patient  not taking: Reported on 11/26/2014 08/16/14   Malvin Johns, MD  XARELTO STARTER PACK 15 & 20 MG TBPK Take 15-20 mg by mouth as directed. Take as directed on package: Start with one 53m tablet by mouth twice a day with food. On Day 22, switch to one 275mtablet once a day with food. 02/07/15   BrNoemi ChapelMD   BP 107/66 mmHg  Pulse 65  Temp(Src) 98.2 F (36.8 C) (Oral)  Resp 16  Ht '5\' 4"'  (1.626 m)  Wt 220 lb (99.791 kg)  BMI 37.74 kg/m2  SpO2 97%  LMP 01/25/2015 Physical Exam  Constitutional: She appears well-developed and well-nourished.  HENT:  Head: Normocephalic and atraumatic.  Eyes: Conjunctivae are  normal. Right eye exhibits no discharge. Left eye exhibits no discharge.  Pulmonary/Chest: Effort normal. No respiratory distress.  Musculoskeletal: She exhibits tenderness ( Tender to palpation in the right calf).  Neurological: She is alert. Coordination normal.  Skin: Skin is warm and dry. No rash noted. She is not diaphoretic. No erythema.  Psychiatric: She has a normal mood and affect.  Nursing note and vitals reviewed.   ED Course  Procedures (including critical care time) Labs Review Labs Reviewed - No data to display  Imaging Review Dg Knee 2 Views Right  02/07/2015   CLINICAL DATA:  Acute onset of right calf pain for 3 days. Initial encounter.  EXAM: RIGHT KNEE - 1-2 VIEW  COMPARISON:  Right knee radiographs performed 08/16/2014  FINDINGS: There is no evidence of fracture or dislocation. The joint spaces are preserved. Small tibial spine and wall osteophytes are seen; the patellofemoral joint is grossly unremarkable in appearance. An accessory ossicle is noted at the distal patellar tendon.  A small knee joint effusion is noted. The visualized soft tissues are otherwise unremarkable in appearance.  IMPRESSION: 1. No evidence of fracture or dislocation. 2. Small knee joint effusion noted.   Electronically Signed   By: JeGarald Balding.D.   On: 02/07/2015 00:47    MDM   Final diagnoses:  DVT, lower extremity, right    Vital signs remained unremarkable, the patient will need pain medication and the first dose of Xarelto here, I have encouraged her to call her family doctor to get the rest of the medications filled tomorrow. I have attempted to call the insurance company without success. I cannot get throughout any of the recorded lines, she appears stable at this time   Meds given in ED:  Medications  Rivaroxaban (XARELTO) tablet 15 mg (not administered)  rivaroxaban (XARELTO) Education Kit for DVT/PE patients (not administered)  HYDROcodone-acetaminophen (NORCO/VICODIN) 5-325  MG per tablet 2 tablet (2 tablets Oral Given 02/07/15 1237)    New Prescriptions   HYDROCODONE-ACETAMINOPHEN (NORCO/VICODIN) 5-325 MG PER TABLET    Take 2 tablets by mouth every 4 (four) hours as needed.      BrNoemi ChapelMD 02/07/15 1248

## 2015-02-07 NOTE — ED Notes (Signed)
Dr. Leonette Monarch at the bedside to update patient on plan of care.

## 2015-02-07 NOTE — ED Notes (Signed)
Dr Miller at bedside. 

## 2015-02-07 NOTE — ED Provider Notes (Signed)
CSN: [id number]     Arrival date & time 02/07/15  8891 History   First MD Initiated Contact with Patient 02/07/15 [phone]     Chief Complaint  Patient presents with  . DVT     (Consider location/radiation/quality/duration/timing/severity/associated sxs/prior Treatment) HPI Comments: The patient is a 44 year old female, she has a history of DVT in the past, she presented last night because of leg pain and swelling, had a elevated d-dimer, ultrasound was performed this morning confirming a DVT below the level of the knee on the right. She states that she had a fracture approximately 6 months ago of her right lower extremity, she did not seek treatment for this, she has been able to be ambulatory on it though and it has healed gradually. She denies chest pain or shortness of breath, leg pain is persistent, moderate, worse with palpation or ambulation.  The history is provided by the patient.    Past Medical History  Diagnosis Date  . Asthma   . Migraines   . Bone spur 2008  . Anemia   . DVT (deep venous thrombosis)    Past Surgical History  Procedure Laterality Date  . Shoulder surgery Left 2008    Bone Spur removal  . Cholecystectomy    . Gastric bypass    . Knee surgery    . Cesarean section     Family History  Problem Relation Age of Onset  . Cancer Father     oral   History  Substance Use Topics  . Smoking status: Former Research scientist (life sciences)  . Smokeless tobacco: Never Used  . Alcohol Use: Yes     Comment: less than once a week-usually only holidays/special occasions   OB History    Gravida Para Term Preterm AB TAB SAB Ectopic Multiple Living   13    13  13   3      Review of Systems  All other systems reviewed and are negative.     Allergies  Review of patient's allergies indicates no known allergies.  Home Medications   Prior to Admission medications   Medication Sig Start Date End Date Taking? Authorizing Provider  albuterol (PROVENTIL HFA;VENTOLIN HFA) 108 (90 BASE)  MCG/ACT inhaler Inhale 2 puffs into the lungs every 6 (six) hours as needed for wheezing. 06/10/14   Thao P Le, DO  albuterol (PROVENTIL) (2.5 MG/3ML) 0.083% nebulizer solution Take 3 mLs (2.5 mg total) by nebulization every 4 (four) hours as needed for wheezing or shortness of breath. 06/10/14   Thao P Le, DO  amoxicillin-clavulanate (AUGMENTIN) 875-125 MG per tablet Take 1 tablet by mouth 2 (two) times daily. Patient not taking: Reported on 08/16/2014 06/10/14   Thao P Le, DO  benzonatate (TESSALON) 100 MG capsule Take 2 capsules (200 mg total) by mouth 2 (two) times daily as needed. Patient not taking: Reported on 08/16/2014 06/10/14   Thao P Le, DO  HYDROcodone-acetaminophen (NORCO/VICODIN) 5-325 MG per tablet Take 1-2 tablets by mouth every 6 (six) hours as needed. 12/12/14   Glendell Docker, NP  HYDROcodone-homatropine (HYCODAN) 5-1.5 MG/5ML syrup Take 5 mLs by mouth at bedtime as needed. Patient not taking: Reported on 08/16/2014 06/10/14   Thao P Le, DO  ibuprofen (ADVIL,MOTRIN) 600 MG tablet Take 1 tablet (600 mg total) by mouth every 6 (six) hours as needed. 11/26/14   Doy Hutching, MD  methylPREDNIsolone (MEDROL DOSPACK) 4 MG tablet follow package directions Patient not taking: Reported on 08/16/2014 06/10/14   Thao P Le, DO  naproxen (  NAPROSYN) 375 MG tablet Take 1 tablet (375 mg total) by mouth 2 (two) times daily. Patient not taking: Reported on 11/26/2014 08/16/14   Malvin Johns, MD  oxyCODONE-acetaminophen (PERCOCET) 5-325 MG per tablet Take 1-2 tablets by mouth every 4 (four) hours as needed. Patient not taking: Reported on 11/26/2014 08/16/14   Malvin Johns, MD  XARELTO STARTER PACK 15 & 20 MG TBPK Take 15-20 mg by mouth as directed. Take as directed on package: Start with one 15mg  tablet by mouth twice a day with food. On Day 22, switch to one 20mg  tablet once a day with food. 02/07/15   Noemi Chapel, MD   LMP 01/25/2015 Physical Exam  Constitutional: She appears well-developed and  well-nourished. No distress.  HENT:  Head: Normocephalic and atraumatic.  Mouth/Throat: Oropharynx is clear and moist. No oropharyngeal exudate.  Eyes: Conjunctivae and EOM are normal. Pupils are equal, round, and reactive to light. Right eye exhibits no discharge. Left eye exhibits no discharge. No scleral icterus.  Neck: Normal range of motion. Neck supple. No JVD present. No thyromegaly present.  Cardiovascular: Normal rate, regular rhythm, normal heart sounds and intact distal pulses.  Exam reveals no gallop and no friction rub.   No murmur heard. Pulmonary/Chest: Effort normal and breath sounds normal. No respiratory distress. She has no wheezes. She has no rales.  Abdominal: Soft. Bowel sounds are normal. She exhibits no distension and no mass. There is no tenderness.  Musculoskeletal: Normal range of motion. She exhibits tenderness ( Right lower extremity tenderness in the posterior calf, positive Homan sign, no obvious asymmetry or edema, normal pulses at the dorsalis pedis bilaterally). She exhibits no edema.  Lymphadenopathy:    She has no cervical adenopathy.  Neurological: She is alert. Coordination normal.  Skin: Skin is warm and dry. No rash noted. No erythema.  Psychiatric: She has a normal mood and affect. Her behavior is normal.  Nursing note and vitals reviewed.   ED Course  Procedures (including critical care time) Labs Review Labs Reviewed - No data to display  Imaging Review Dg Knee 2 Views Right  02/07/2015   CLINICAL DATA:  Acute onset of right calf pain for 3 days. Initial encounter.  EXAM: RIGHT KNEE - 1-2 VIEW  COMPARISON:  Right knee radiographs performed 08/16/2014  FINDINGS: There is no evidence of fracture or dislocation. The joint spaces are preserved. Small tibial spine and wall osteophytes are seen; the patellofemoral joint is grossly unremarkable in appearance. An accessory ossicle is noted at the distal patellar tendon.  A small knee joint effusion is  noted. The visualized soft tissues are otherwise unremarkable in appearance.  IMPRESSION: 1. No evidence of fracture or dislocation. 2. Small knee joint effusion noted.   Electronically Signed   By: Garald Balding M.D.   On: 02/07/2015 00:47    MDM   Final diagnoses:  DVT, lower extremity, right    Vital signs unremarkable, DVT confirmed by ultrasound, I spoke with the vascular technician who states that she saw blood clot below the knee. No proximal clot. Had a very long discussion with the patient regarding her options including oral anticoagulate lens, Coumadin, Lovenox. In the past she was on Lovenox secondary to pregnancy, at this time she has chosen Xarelto instead of Coumadin, I have discussed with her at length the risks benefits and alternatives and she has chosen to use this novel oral anticoagulate. She will call her doctor tomorrow morning to let them know the results so that they  can assist with the treatment plan the course and the timing of the anticoagulate use. She is aware of the indications for return including chest pain shortness of breath or abnormal bleeding. She is stable for discharge.  Meds given in ED:  Medications - No data to display  New Prescriptions   XARELTO STARTER PACK 15 & 20 MG TBPK    Take 15-20 mg by mouth as directed. Take as directed on package: Start with one 15mg  tablet by mouth twice a day with food. On Day 22, switch to one 20mg  tablet once a day with food.        Noemi Chapel, MD 02/07/15 [phone]

## 2015-02-07 NOTE — ED Notes (Signed)
Spoke with Dr. Leonette Monarch in regards to plan of care. Doppler study to be preformed as outpatient, cover with lovenox. D-dimer to be added.

## 2015-02-07 NOTE — Discharge Instructions (Signed)
Please call your doctor for a followup appointment within 24-48 hours. When you talk to your doctor please let them know that you were seen in the emergency department and have them acquire all of your records so that they can discuss the findings with you and formulate a treatment plan to fully care for your new and ongoing problems. ° °

## 2015-02-07 NOTE — ED Notes (Signed)
Pt. Presents after a venous doppler this AM that was positive for DVT. Pt. Ambulatory, only complaint is pain to R lower leg.

## 2015-02-07 NOTE — Progress Notes (Signed)
VASCULAR LAB PRELIMINARY  PRELIMINARY  PRELIMINARY  PRELIMINARY  Bilateral lower extremity venous Dopplers completed.    Preliminary report:  There is acute DVT noted in the right Soleal vein.    Yesenia Wheeler, RVT 02/07/2015, 8:41 AM

## 2015-02-07 NOTE — Discharge Instructions (Signed)
IMPORTANT PATIENT INSTRUCTIONS:  You have been scheduled for an Outpatient Vascular Study at [facility].    If tomorrow is a Saturday or Sunday, please go to the [facility] Emergency Department Registration Desk at 8 am tomorrow morning and tell them you are there for a vascular study.  If tomorrow is a weekday (Monday-Friday), please go to [facility] Admitting Department at 8 am and tell them you are   Venous Thromboembolism, Prevention A venous thromboembolism is a blood clot that forms in a vein. A blood clot in a deep vein is called a deep venous thrombosis (DVT). A blood clot in the lungs is called a pulmonary embolism (PE). Blood clots are dangerous and can cause death. Blood clots can form in the:  Lungs.  Legs.  Arms. CAUSES  A blood clot can form in a vein from different conditions. A blood clot can develop due to:  Blood flow within a vein that is sluggish or very slow.  Medical conditions that make the blood clot easily.  Vein damage. RISK FACTORS Risk factors can increase your risk of developing a blood clot. Risk factors can include:  Smoking.  Obesity.  Age.  Immobility or sedentary lifestyle.  Sitting or standing for long periods of time.  Chronic or long-term bedrest.  Medical or past history of blood clots.  Family history of blood clots.  Hip, leg, or pelvis injury or trauma.  Major surgery, especially surgery on the hip, knee, or abdomen.  Pregnancy and childbirth.  Birth control pills and hormone replacement therapy.  Medical conditions such as  Peripheral vascular disease (PVD).  Diabetes.  Cancer. SYMPTOMS  Symptoms of VTE can depend on where the clot is located and if the clot breaks off and travels to another organ. Sometimes, there may be no symptoms.   DVT symptoms can include:  Swelling of the leg or arm, especially on one side.  Warmth and redness of the leg or arm, especially on one side.  Pain in an arm or leg. Leg  pain may be more noticeable or worse when standing or walking.  PE symptoms can include:  Shortness of breath.  Coughing.  Coughing up blood or blood-tinged mucus (hemoptysis).  Chest pain or chest pain with deep breaths (pleuritic chest pain).  Apprehension, anxiety, or a feeling of impending doom.  Rapid heartbeat. PREVENTION  Exercise regularly. Take a brisk 30 minute walk every day. Staying active and moving around can help prevent blood clots.  Avoid sitting or lying in bed for long periods of time. Change your position often, especially during a long trip.  Women, especially those over the age of 71, should consider the risks and benefits of taking estrogen medicines. This includes birth control pills and hormone replacement therapy.  Do not smoke, especially if you take estrogen medicines. If you smoke, talk to your caregiver on how to quit.  Eat plenty of fruits and vegetables. Ask your caregiver or dietitian if there are foods you should avoid.  Maintain a weight as suggested by your caregiver.  Wear loose-fitting clothing. Avoid constrictive or tight clothing around your legs or waist.  Try not to bump or injure your legs. Avoid crossing your legs when you are sitting.  Do not use pillows under your knees unless told by your caregiver.  Take all medicines that your caregiver prescribes you.  Wear special stockings (compression stockings or TED hose) if your caregiver prescribes them.  Wearing compression stockings (support hose) can make  the leg veins more narrow. This increases blood flow in the legs and can help prevent blood clots.  It is important to wear compression stockings correctly. Do not let them bunch up when you are wearing them. TRAVEL Long distance travel can increase the risk of a blood clot. To prevent a blood clot when traveling:  You should exercise your legs by walking or by pumping your muscles every hour. To help prevent poor circulation on  long trips, stand, stretch, and walk up and down the aisle of your airplane, train, or bus as often as possible to get the blood moving.  Do squats if you are able. If you are unable to do squats, raise your foot on the balls of your feet and tighten your lower leg muscles (particularly the calve muscles) while seated. Pointing (flexing and extending) your toes while tightening your calves while seated are also good exercises to do every hour during long trips. They help increase blood flow and reduce risk of DVT.  Stay well hydrated. Drink water regularly when traveling, especially when you are sitting or immobile for long periods of time.  Use of drugs to prevent DVT during routine travel is not generally recommended. Before taking any drugs to reduce risk of DVT, consult your caregiver. SURGERY AND HOSPITALIZATION  People who are at high risk for a blood clot may be given a blood thinning medicine (anticoagulant) when they are hospitalized even if they are not going to have surgery.  A long trip prior to surgery can increase the risk of a clot for patients undergoing hip and knee replacements. Talk to your caregiver about travel plans before your surgery.  After hip or knee surgery, your caregiver may give you anticoagulants to help prevent blood clots.  Anticoagulants may be given to people at high risk of developing thromboembolism, before, during, or sometimes after surgery, including people with clotting disorders or with a history of past thromboembolism. TRAVEL AFTER SURGERY  In orthopedic surgery, the cutting of bones prompts the body to increase clotting factors in the blood. Due to the size of the bones involved in hip and knee replacements, there is a higher risk of blood clotting than other orthopedic surgeries.  There is a risk of clotting for up to 4-6 weeks after surgery. Flying or traveling long distances can increase your risk of a clot. As a result, those who travel long  distances may need additional preventive measures after their procedure.  Drink only non-alcoholic beverages during your flight, train, or car travel. Alcohol can dehydrate you and increase your risk of getting blood clots. SEEK IMMEDIATE MEDICAL CARE IF:   You develop chest pain.  You develop severe shortness of breath.  You have breathing problems after traveling.  You develop swelling or pain in the leg.  You begin to cough up bloody mucus or phlegm (sputum).  You feel dizzy or faint. Document Released: 07/19/2009 Document Revised: 04/24/2012 Document Reviewed: 07/19/2009 [facility] Patient Information 2015 [place], [place]. This information is not intended to replace advice given to you by your health care provider. Make sure you discuss any questions you have with your health care provider.

## 2015-02-07 NOTE — ED Notes (Signed)
Was here and dc'd this am, diagnosed with DVT.  Coming back because pain is excruciating.

## 2015-02-07 NOTE — ED Notes (Signed)
Patient allowed to drink per Dr. Leonette Monarch.

## 2015-02-11 ENCOUNTER — Emergency Department (HOSPITAL_BASED_OUTPATIENT_CLINIC_OR_DEPARTMENT_OTHER)
Admit: 2015-02-11 | Discharge: 2015-02-11 | Disposition: A | Discharge status: Home / Self Care | Payer: BLUE CROSS/BLUE SHIELD

## 2015-02-11 ENCOUNTER — Emergency Department (HOSPITAL_COMMUNITY): Payer: BLUE CROSS/BLUE SHIELD

## 2015-02-11 ENCOUNTER — Emergency Department (HOSPITAL_COMMUNITY)
Admission: EM | Admit: 2015-02-11 | Discharge: 2015-02-11 | Disposition: A | Discharge status: Home / Self Care | Payer: BLUE CROSS/BLUE SHIELD | Attending: Emergency Medicine | Admitting: Emergency Medicine

## 2015-02-11 ENCOUNTER — Encounter (HOSPITAL_COMMUNITY): Payer: Self-pay | Admitting: *Deleted

## 2015-02-11 ENCOUNTER — Emergency Department (HOSPITAL_BASED_OUTPATIENT_CLINIC_OR_DEPARTMENT_OTHER)
Admit: 2015-02-11 | Discharge: 2015-02-11 | Disposition: A | Discharge status: Home / Self Care | Payer: BLUE CROSS/BLUE SHIELD | Attending: Emergency Medicine | Admitting: Emergency Medicine

## 2015-02-11 DIAGNOSIS — Z8739 Personal history of other diseases of the musculoskeletal system and connective tissue: Secondary | ICD-10-CM | POA: Insufficient documentation

## 2015-02-11 DIAGNOSIS — M79604 Pain in right leg: Secondary | ICD-10-CM

## 2015-02-11 DIAGNOSIS — R0789 Other chest pain: Secondary | ICD-10-CM | POA: Diagnosis not present

## 2015-02-11 DIAGNOSIS — Z87891 Personal history of nicotine dependence: Secondary | ICD-10-CM | POA: Insufficient documentation

## 2015-02-11 DIAGNOSIS — Z862 Personal history of diseases of the blood and blood-forming organs and certain disorders involving the immune mechanism: Secondary | ICD-10-CM | POA: Insufficient documentation

## 2015-02-11 DIAGNOSIS — M7989 Other specified soft tissue disorders: Secondary | ICD-10-CM | POA: Diagnosis not present

## 2015-02-11 DIAGNOSIS — J45909 Unspecified asthma, uncomplicated: Secondary | ICD-10-CM | POA: Diagnosis not present

## 2015-02-11 DIAGNOSIS — I82401 Acute embolism and thrombosis of unspecified deep veins of right lower extremity: Secondary | ICD-10-CM | POA: Diagnosis not present

## 2015-02-11 DIAGNOSIS — G43909 Migraine, unspecified, not intractable, without status migrainosus: Secondary | ICD-10-CM | POA: Diagnosis not present

## 2015-02-11 MED ORDER — MORPHINE SULFATE 4 MG/ML IJ SOLN
4.0000 mg | Freq: Once | INTRAMUSCULAR | Status: AC
  Administered 2015-02-11: 4 mg via INTRAVENOUS
  Filled 2015-02-11: qty 1

## 2015-02-11 MED ORDER — IOHEXOL 350 MG/ML SOLN
100.0000 mL | Freq: Once | INTRAVENOUS | Status: AC | PRN
  Administered 2015-02-11: 100 mL via INTRAVENOUS

## 2015-02-11 MED ORDER — OXYCODONE-ACETAMINOPHEN 5-325 MG PO TABS: 1.0000 | ORAL_TABLET | Freq: Four times a day (QID) | ORAL | Status: DC | PRN

## 2015-02-11 MED ORDER — SODIUM CHLORIDE 0.9 % IV BOLUS (SEPSIS)
1000.0000 mL | Freq: Once | INTRAVENOUS | Status: AC
Start: 2015-02-11 — End: 2015-02-11
  Administered 2015-02-11: 1000 mL via INTRAVENOUS

## 2015-02-11 MED ORDER — HYDROMORPHONE HCL 1 MG/ML IJ SOLN
1.0000 mg | Freq: Once | INTRAMUSCULAR | Status: AC
  Administered 2015-02-11: 1 mg via INTRAVENOUS
  Filled 2015-02-11: qty 1

## 2015-02-11 MED ORDER — HYDROCODONE-ACETAMINOPHEN 5-325 MG PO TABS
2.0000 | ORAL_TABLET | Freq: Once | ORAL | Status: AC
  Administered 2015-02-11: 2 via ORAL
  Filled 2015-02-11: qty 2

## 2015-02-11 NOTE — ED Notes (Signed)
Pt reporting she is having chest pain, pt placed on cardiac monitor and EKG captured, given to MD.

## 2015-02-11 NOTE — ED Notes (Signed)
Pt vitals stable, placed on 2L of O2 via [place] for comfort. Pt denies any improvement in pain.

## 2015-02-11 NOTE — Progress Notes (Signed)
VASCULAR LAB PRELIMINARY  PRELIMINARY  PRELIMINARY  PRELIMINARY  Right lower extremity arterial duplex completed.    Preliminary report:  There is no stenosis noted throughout the right lower extremity arterial system.  Waveforms are within normal limits.  Brissia Delisa, RVT 02/11/2015, 7:00 PM

## 2015-02-11 NOTE — ED Provider Notes (Signed)
Patient with recently diagnosed DVT, recurrent, now being evaluated for progression of disease, after recent evaluation earlier today. After patient has been waiting for some time for her ultrasound, she had sudden onset sternal chest retrocolic pleuritic pain. On exam the patient has stable vital signs, but continues to complain of chest pain. With supplemental oxygen, analgesia symptoms improved, but with concern for thromboembolic processes, patient will have CT angiography in addition to her ultrasound.Yesenia Wheeler  Update: On repeat exam after the patient has had CT angiography, ultrasound, she is in no distress, vital signs remained stable. We discussed all findings at length. I also discussed patient's case with our interventional radiology team, and we are trying to facilitate outpatient evaluation for consideration of directed thrombolysis. Patient will follow up tomorrow with those individuals.   Carmin Muskrat, MD 02/12/15 [phone]

## 2015-02-11 NOTE — ED Provider Notes (Signed)
CSN: [id number]     Arrival date & time 02/11/15  1317 History   First MD Initiated Contact with Patient 02/11/15 1418     Chief Complaint  Patient presents with  . Leg Pain     (Consider location/radiation/quality/duration/timing/severity/associated sxs/prior Treatment) HPI   The patient presents with right lower extremity pain which shoots from her foot up the back of her legs and to her anterior thighs.  The pain is worse when walking, and better when resting her leg. She has tried to treat with prescription pain medicine, but today that pain was much more severe.  At rest there is still persistent dull ache behind her right knee. She is complaining of increased swelling.  Pain is severe rated 10 out of 10 when walking.  She denies any fever, chills, sweats, shortness of breath, palpitations, chest pain.  She was diagnosed with DVT 4 days ago, and has been compliant with Xarelto therapy.  Today it is acutely worse, and she states she was instructed to return with worsening sx.    Past Medical History  Diagnosis Date  . Asthma   . Migraines   . Bone spur 2008  . Anemia   . DVT (deep venous thrombosis)    Past Surgical History  Procedure Laterality Date  . Shoulder surgery Left 2008    Bone Spur removal  . Cholecystectomy    . Gastric bypass    . Knee surgery    . Cesarean section     Family History  Problem Relation Age of Onset  . Cancer Father     oral   History  Substance Use Topics  . Smoking status: Former Research scientist (life sciences)  . Smokeless tobacco: Never Used  . Alcohol Use: Yes     Comment: less than once a week-usually only holidays/special occasions   OB History    Gravida Para Term Preterm AB TAB SAB Ectopic Multiple Living   13    13  13   3      Review of Systems  Constitutional: Negative.   HENT: Negative.   Eyes: Negative.   Respiratory: Negative.   Cardiovascular: Negative.  Negative for chest pain, palpitations and leg swelling.  Gastrointestinal: Negative.    Musculoskeletal: Positive for myalgias. Negative for back pain, joint swelling, arthralgias, neck pain and neck stiffness.  Skin: Negative.   Neurological: Negative.   Psychiatric/Behavioral: Negative.     Allergies  Review of patient's allergies indicates no known allergies.  Home Medications   Prior to Admission medications   Medication Sig Start Date End Date Taking? Authorizing Provider  albuterol (PROVENTIL HFA;VENTOLIN HFA) 108 (90 BASE) MCG/ACT inhaler Inhale 2 puffs into the lungs every 6 (six) hours as needed for wheezing. 06/10/14  Yes Thao P Le, DO  albuterol (PROVENTIL) (2.5 MG/3ML) 0.083% nebulizer solution Take 3 mLs (2.5 mg total) by nebulization every 4 (four) hours as needed for wheezing or shortness of breath. 06/10/14  Yes Thao P Le, DO  baclofen (LIORESAL) 10 MG tablet Take 10 mg by mouth daily as needed for muscle spasms. Avoid daily use, limit to 1-2 days per week 12/21/14  Yes Historical Provider, MD  ibuprofen (ADVIL,MOTRIN) 600 MG tablet Take 1 tablet (600 mg total) by mouth every 6 (six) hours as needed. 11/26/14  Yes Doy Hutching, MD  XARELTO STARTER PACK 15 & 20 MG TBPK Take 15-20 mg by mouth as directed. Take as directed on package: Start with one 15mg  tablet by mouth twice a day with food.  On Day 22, switch to one 20mg  tablet once a day with food. 02/07/15  Yes Noemi Chapel, MD  zonisamide (ZONEGRAN) 25 MG capsule Take 100 mg by mouth at bedtime. Increase by 25 mg weekly as directed 12/21/14  Yes Historical Provider, MD  HYDROcodone-acetaminophen (NORCO/VICODIN) 5-325 MG per tablet Take 2 tablets by mouth every 4 (four) hours as needed. Patient not taking: Reported on 02/11/2015 02/07/15   Noemi Chapel, MD   BP 118/74 mmHg  Pulse 58  Temp(Src) 98.7 F (37.1 C) (Oral)  Resp 16  Ht 5\' 4"  (1.626 m)  Wt 220 lb (99.791 kg)  BMI 37.74 kg/m2  SpO2 100%  LMP 01/25/2015 Physical Exam  Constitutional: She is oriented to person, place, and time. She appears  well-developed and well-nourished. No distress.  HENT:  Head: Normocephalic and atraumatic.  Right Ear: External ear normal.  Left Ear: External ear normal.  Nose: Nose normal.  Mouth/Throat: Oropharynx is clear and moist.  Eyes: Conjunctivae and EOM are normal. Pupils are equal, round, and reactive to light. Right eye exhibits no discharge. Left eye exhibits no discharge. No scleral icterus.  Neck: Normal range of motion. No JVD present. No tracheal deviation present. No thyromegaly present.  Cardiovascular: Normal rate, regular rhythm, S1 normal, S2 normal, normal heart sounds and intact distal pulses.  PMI is not displaced.  Exam reveals no gallop and no friction rub.   No murmur heard. Pulses:      Radial pulses are 2+ on the right side, and 2+ on the left side.       Dorsalis pedis pulses are 2+ on the right side, and 2+ on the left side.  Pulmonary/Chest: Effort normal and breath sounds normal. No accessory muscle usage or stridor. No tachypnea. No respiratory distress. She has no decreased breath sounds. She has no wheezes. She has no rales. She exhibits no tenderness.  Abdominal: Soft. Bowel sounds are normal. She exhibits no distension and no mass. There is no tenderness. There is no rebound and no guarding.  Musculoskeletal: Normal range of motion. She exhibits no edema or tenderness.  Painful dorsiflexion of right foot - passive and active Diffusely ttp over LLE, worse in popliteal fossa No pitting edema, no erythema, no palpable cord  Lymphadenopathy:    She has no cervical adenopathy.  Neurological: She is alert and oriented to person, place, and time. She has normal reflexes. No cranial nerve deficit. She exhibits normal muscle tone. Coordination normal.  Skin: Skin is warm and dry. No rash noted. She is not diaphoretic. No erythema. No pallor.  Psychiatric: She has a normal mood and affect. Her behavior is normal. Judgment and thought content normal.  Nursing note and vitals  reviewed.   ED Course  Procedures (including critical care time) Labs Review Labs Reviewed - No data to display  Imaging Review No results found.   EKG Interpretation None      MDM   Final diagnoses:  Leg pain, right    Increasing right LE leg pain with movement - claudication-like sx, recent dx of DVT, with 4 d of Xarelto tx  Will get LE venous and arterial duplex to evaluate any progression of DVT, and r/o any impingement on arterial vasculature.  Pain out of proportion to exam.  Leg is swollen, but no pitting edema, erythema, heat. I have been unable to control her pain with PO meds, IV pain meds now ordered.    At 5:16 PM care of pt has been handed off  to Dr. Leonette Monarch and Dr. Vanita Panda, duplexes pending at this time, but pt is otherwise hemodynamically stable, no tachycardia, afebrile, and has just received another dose of pain meds.      Delsa Grana, PA-C 02/11/15 [place], MD 02/13/15 [phone]

## 2015-02-11 NOTE — Discharge Instructions (Signed)
As discussed, today's evaluation has demonstrated no blood clot in your lung, and consistent clot in her leg.  There is no evidence for dangerous progression, but with your increasing pain, is important to follow-up with our radiology colleagues for consultation on additional intervention that may decrease the size of the disease, and decrease your pain.  When you speak with the receptionist as indicated you have been seen in the emergency department for symptomatic DVT, and request consultation with interventional radiology.

## 2015-02-11 NOTE — Progress Notes (Signed)
VASCULAR LAB PRELIMINARY  PRELIMINARY  PRELIMINARY  PRELIMINARY  Right lower extremity venous Doppler completed.    Preliminary report:  The DVT found in the right soleal vein 02/07/15, remains.  It has not propagated nor has it lessened in size.  Yesenia Wheeler, RVT 02/11/2015, 6:57 PM

## 2015-02-11 NOTE — ED Notes (Signed)
Pt states was dx with dvt last week and was told to come to ED if she noticed any changes.  States numbness to R calf and pain to entire R leg.  Denies sob or chest pain.  Pt is taking xarelto.

## 2015-02-11 NOTE — ED Notes (Signed)
EDP at bedside  

## 2015-02-16 ENCOUNTER — Ambulatory Visit
Admission: RE | Admit: 2015-02-16 | Discharge: 2015-02-16 | Disposition: A | Discharge status: Home / Self Care | Payer: BLUE CROSS/BLUE SHIELD | Source: Ambulatory Visit | Attending: Interventional Radiology | Admitting: Interventional Radiology

## 2015-02-16 DIAGNOSIS — I82401 Acute embolism and thrombosis of unspecified deep veins of right lower extremity: Secondary | ICD-10-CM

## 2015-02-16 DIAGNOSIS — M79604 Pain in right leg: Secondary | ICD-10-CM | POA: Insufficient documentation

## 2015-02-16 DIAGNOSIS — I82409 Acute embolism and thrombosis of unspecified deep veins of unspecified lower extremity: Secondary | ICD-10-CM | POA: Insufficient documentation

## 2015-02-16 NOTE — Consult Note (Signed)
Chief Complaint: Chief Complaint  Patient presents with  . Advice Only    Consult for thrombolysis     Referring Physician(s): Jaylin Roundy  History of Present Illness: Yesenia Wheeler is a 44 y.o. female with a prior history of DVT. She presented to the emergency room with leg pain and chest pain. CTA was negative for pulmonary embolus. Lower extremity Doppler revealed right lower extremity infrapopliteal calf DVT within the soleal vein only. She was discharged on Xarelto. She presents for outpatient follow-up. She has been on Xarelto for 10 days. Leg pain has resolved. No significant asymmetry. No residual symptoms. She continues to work full-time. No physical limitations.  Past Medical History  Diagnosis Date  . Asthma   . Migraines   . Bone spur 2008  . Anemia   . DVT (deep venous thrombosis)     Past Surgical History  Procedure Laterality Date  . Shoulder surgery Left 2008    Bone Spur removal  . Cholecystectomy    . Gastric bypass    . Knee surgery    . Cesarean section      Allergies: Review of patient's allergies indicates no known allergies.  Medications: Prior to Admission medications   Medication Sig Start Date End Date Taking? Authorizing Provider  albuterol (PROVENTIL HFA;VENTOLIN HFA) 108 (90 BASE) MCG/ACT inhaler Inhale 2 puffs into the lungs every 6 (six) hours as needed for wheezing. 06/10/14  Yes Thao P Le, DO  albuterol (PROVENTIL) (2.5 MG/3ML) 0.083% nebulizer solution Take 3 mLs (2.5 mg total) by nebulization every 4 (four) hours as needed for wheezing or shortness of breath. 06/10/14  Yes Thao P Le, DO  baclofen (LIORESAL) 10 MG tablet Take 10 mg by mouth daily as needed for muscle spasms. Avoid daily use, limit to 1-2 days per week 12/21/14  Yes Historical Provider, MD  ibuprofen (ADVIL,MOTRIN) 600 MG tablet Take 1 tablet (600 mg total) by mouth every 6 (six) hours as needed. 11/26/14  Yes Doy Hutching, MD  XARELTO STARTER  PACK 15 & 20 MG TBPK Take 15-20 mg by mouth as directed. Take as directed on package: Start with one 15mg  tablet by mouth twice a day with food. On Day 22, switch to one 20mg  tablet once a day with food. 02/07/15  Yes Noemi Chapel, MD  zonisamide (ZONEGRAN) 25 MG capsule Take 100 mg by mouth at bedtime. Increase by 25 mg weekly as directed 12/21/14  Yes Historical Provider, MD  oxyCODONE-acetaminophen (PERCOCET/ROXICET) 5-325 MG per tablet Take 1-2 tablets by mouth every 6 (six) hours as needed for severe pain. Patient not taking: Reported on 02/16/2015 02/11/15   Carmin Muskrat, MD     Family History  Problem Relation Age of Onset  . Cancer Father     oral    History   Social History  . Marital Status: Married    Spouse Name: [name]  . Number of Children: 2  . Years of Education: Associates   Occupational History  . LPN [employer]   Social History Main Topics  . Smoking status: Former Research scientist (life sciences)  . Smokeless tobacco: Never Used  . Alcohol Use: Yes     Comment: less than once a week-usually only holidays/special occasions  . Drug Use: No  . Sexual Activity: Not on file   Other Topics Concern  . None   Social History Narrative   Lives with her husband and their children     Review of Systems: A 12 point  ROS discussed and pertinent positives are indicated in the HPI above.  All other systems are negative.  Review of Systems  Vital Signs: BP 120/74 mmHg  Pulse 70  Temp(Src) 98.3 F (36.8 C) (Oral)  Resp 14  Ht 5\' 4"  (1.626 m)  Wt 220 lb (99.791 kg)  BMI 37.74 kg/m2  SpO2 99%  LMP 01/25/2015  Physical Exam  Constitutional: She appears well-developed and well-nourished. No distress.  Obese female in no distress.  Musculoskeletal:  Lower extremity demonstrate symmetric soft calfs. Both Measure 45 Cm.  Skin: She is not diaphoretic.    Imaging: Dg Knee 2 Views Right  02/07/2015   CLINICAL DATA:  Acute onset of right calf pain for 3 days. Initial  encounter.  EXAM: RIGHT KNEE - 1-2 VIEW  COMPARISON:  Right knee radiographs performed 08/16/2014  FINDINGS: There is no evidence of fracture or dislocation. The joint spaces are preserved. Small tibial spine and wall osteophytes are seen; the patellofemoral joint is grossly unremarkable in appearance. An accessory ossicle is noted at the distal patellar tendon.  A small knee joint effusion is noted. The visualized soft tissues are otherwise unremarkable in appearance.  IMPRESSION: 1. No evidence of fracture or dislocation. 2. Small knee joint effusion noted.   Electronically Signed   By: Garald Balding M.D.   On: 02/07/2015 00:47   Ct Angio Chest Pe W/cm &/or Wo Cm  02/11/2015   CLINICAL DATA:  44 year old female with recent diagnosis of DVT. Patient reports pain and numbness in the right lower extremity.  EXAM: CT ANGIOGRAPHY CHEST WITH CONTRAST  TECHNIQUE: Multidetector CT imaging of the chest was performed using the standard protocol during bolus administration of intravenous contrast. Multiplanar CT image reconstructions and MIPs were obtained to evaluate the vascular anatomy.  CONTRAST:  122mL OMNIPAQUE IOHEXOL 350 MG/ML SOLN  COMPARISON:  Chest radiograph dated 06/10/2014 and chest CT dated 05/23/2013.  FINDINGS: Lungs, pleural spaces, and central airways: Clear. No pleural lesion. No pneumothorax.  Vasculature: Unremarkable as visualized. No CT evidence of pulmonary embolism. No thoracic aortic aneurysm or dissection.  Heart: No cardiomegaly. No pericardial effusion.  Mediastinum/Lymph nodes: Unremarkable. No mass. Top-normal right hilar lymph node.  Chest wall/ musculoskeletal: Unremarkable. No acute fracture.  Upper abdomen: Postsurgical changes of gastric bypass. There is apparent haziness of the visualized peripancreatic fat. Correlation with pancreatic enzymes recommended to exclude pancreatitis.  Review of the MIP images confirms the above findings.  IMPRESSION: No CT evidence of pulmonary embolism.   Minimal peripancreatic stranding. Correlation with pancreatic enzymes recommended to exclude pancreatitis.   Electronically Signed   By: Anner Crete M.D.   On: 02/11/2015 20:02    Labs:  CBC:  Recent Labs  02/06/15 2313  WBC 7.0  HGB 11.7*  HCT 35.4*  PLT 203    COAGS:  Recent Labs  02/06/15 2313  INR 1.10    BMP:  Recent Labs  02/06/15 2313  NA 139  K 3.8  CL 110  CO2 20*  GLUCOSE 89  BUN 10  CALCIUM 8.2*  CREATININE 0.81  GFRNONAA >60  GFRAA >60     Assessment and Plan:  Right lower extremity infrapopliteal calf DVT. On Xarelto for approximately 10 days. Clinically improving. No physical limitations. No significant leg pain or asymmetry.  plan: Recommend Xarelto for 3-6 months Follow-up with her primary care physician. Consider outpatient hypercoagulable state workup No current clinical indication for interventional therapy. No signs of clot propagation into the femoral popliteal veins.  Thank  you for this interesting consult.  I greatly enjoyed meeting Raeleigh Guinn Whittington-Matsuo and look forward to participating in their care.  SignedGreggory Keen 02/16/2015, 2:58 PM   I spent a total of  20 Minutes   in face to face in clinical consultation, greater than 50% of which was counseling/coordinating care for this patient with a right lower extremity DVT.

## 2015-02-22 ENCOUNTER — Inpatient Hospital Stay: Payer: Self-pay | Admitting: Family Medicine

## 2015-02-27 ENCOUNTER — Emergency Department (HOSPITAL_COMMUNITY)
Admission: EM | Admit: 2015-02-27 | Discharge: 2015-02-27 | Disposition: A | Discharge status: Home / Self Care | Payer: BLUE CROSS/BLUE SHIELD | Attending: Emergency Medicine | Admitting: Emergency Medicine

## 2015-02-27 ENCOUNTER — Encounter (HOSPITAL_COMMUNITY): Payer: Self-pay | Admitting: *Deleted

## 2015-02-27 DIAGNOSIS — Z8739 Personal history of other diseases of the musculoskeletal system and connective tissue: Secondary | ICD-10-CM | POA: Insufficient documentation

## 2015-02-27 DIAGNOSIS — Z79899 Other long term (current) drug therapy: Secondary | ICD-10-CM | POA: Diagnosis not present

## 2015-02-27 DIAGNOSIS — Z87891 Personal history of nicotine dependence: Secondary | ICD-10-CM | POA: Insufficient documentation

## 2015-02-27 DIAGNOSIS — Z7901 Long term (current) use of anticoagulants: Secondary | ICD-10-CM

## 2015-02-27 DIAGNOSIS — Z8679 Personal history of other diseases of the circulatory system: Secondary | ICD-10-CM | POA: Diagnosis not present

## 2015-02-27 DIAGNOSIS — N939 Abnormal uterine and vaginal bleeding, unspecified: Secondary | ICD-10-CM | POA: Diagnosis present

## 2015-02-27 DIAGNOSIS — D68318 Other hemorrhagic disorder due to intrinsic circulating anticoagulants, antibodies, or inhibitors: Secondary | ICD-10-CM | POA: Insufficient documentation

## 2015-02-27 DIAGNOSIS — N920 Excessive and frequent menstruation with regular cycle: Secondary | ICD-10-CM | POA: Diagnosis not present

## 2015-02-27 DIAGNOSIS — J45909 Unspecified asthma, uncomplicated: Secondary | ICD-10-CM | POA: Diagnosis not present

## 2015-02-27 DIAGNOSIS — Z3202 Encounter for pregnancy test, result negative: Secondary | ICD-10-CM | POA: Diagnosis not present

## 2015-02-27 DIAGNOSIS — Z86718 Personal history of other venous thrombosis and embolism: Secondary | ICD-10-CM | POA: Diagnosis not present

## 2015-02-27 LAB — URINALYSIS, ROUTINE W REFLEX MICROSCOPIC
Bilirubin Urine: NEGATIVE
Glucose, UA: NEGATIVE mg/dL
KETONES UR: NEGATIVE mg/dL
Nitrite: NEGATIVE
PROTEIN: NEGATIVE mg/dL
Specific Gravity, Urine: 1.026 (ref 1.005–1.030)
Urobilinogen, UA: 1 mg/dL (ref 0.0–1.0)
pH: 5.5 (ref 5.0–8.0)

## 2015-02-27 LAB — URINE MICROSCOPIC-ADD ON

## 2015-02-27 LAB — CBC WITH DIFFERENTIAL/PLATELET
Basophils Absolute: 0 10*3/uL (ref 0.0–0.1)
Basophils Relative: 0 % (ref 0–1)
Eosinophils Absolute: 0.3 10*3/uL (ref 0.0–0.7)
Eosinophils Relative: 5 % (ref 0–5)
HEMATOCRIT: 37.1 % (ref 36.0–46.0)
Hemoglobin: 12.3 g/dL (ref 12.0–15.0)
Lymphocytes Relative: 46 % (ref 12–46)
Lymphs Abs: 3.1 10*3/uL (ref 0.7–4.0)
MCH: 28 pg (ref 26.0–34.0)
MCHC: 33.2 g/dL (ref 30.0–36.0)
MCV: 84.3 fL (ref 78.0–100.0)
MONO ABS: 0.2 10*3/uL (ref 0.1–1.0)
MONOS PCT: 4 % (ref 3–12)
NEUTROS ABS: 2.9 10*3/uL (ref 1.7–7.7)
Neutrophils Relative %: 45 % (ref 43–77)
PLATELETS: 258 10*3/uL (ref 150–400)
RBC: 4.4 MIL/uL (ref 3.87–5.11)
RDW: 14.6 % (ref 11.5–15.5)
WBC: 6.6 10*3/uL (ref 4.0–10.5)

## 2015-02-27 LAB — I-STAT CHEM 8, ED
BUN: 15 mg/dL (ref 6–20)
CHLORIDE: 108 mmol/L (ref 101–111)
Calcium, Ion: 1.12 mmol/L (ref 1.12–1.23)
Creatinine, Ser: 1 mg/dL (ref 0.44–1.00)
Glucose, Bld: 76 mg/dL (ref 65–99)
HCT: 37 % (ref 36.0–46.0)
Hemoglobin: 12.6 g/dL (ref 12.0–15.0)
Potassium: 4.1 mmol/L (ref 3.5–5.1)
Sodium: 142 mmol/L (ref 135–145)
TCO2: 23 mmol/L (ref 0–100)

## 2015-02-27 LAB — PREGNANCY, URINE: Preg Test, Ur: NEGATIVE

## 2015-02-27 LAB — PROTIME-INR
INR: 1.09 (ref 0.00–1.49)
Prothrombin Time: 14.3 seconds (ref 11.6–15.2)

## 2015-02-27 LAB — SAMPLE TO BLOOD BANK

## 2015-02-27 LAB — I-STAT BETA HCG BLOOD, ED (MC, WL, AP ONLY): I-stat hCG, quantitative: <5 m[IU]/mL (ref <5)

## 2015-02-27 MED ORDER — TRAMADOL HCL 50 MG PO TABS: 50.0000 mg | ORAL_TABLET | Freq: Four times a day (QID) | ORAL | Status: DC | PRN

## 2015-02-27 NOTE — ED Provider Notes (Signed)
CSN: [id number]     Arrival date & time 02/27/15  2156 History   First MD Initiated Contact with Patient 02/27/15 2243     Chief Complaint  Patient presents with  . Vaginal Bleeding     (Consider location/radiation/quality/duration/timing/severity/associated sxs/prior Treatment) Patient is a 44 y.o. female presenting with vaginal bleeding. The history is provided by the patient.  Vaginal Bleeding She states that she was started on rivaroxaban for DVT and started her normal menses 2 days ago. Bleeding has been heavier than normal and cramping is been worsening her normal. She rates her cramping at 8/10. She has been using 2-3 pads a day as opposed to her normal one-2 pads a day. She has not been passing any clots. She is not taken any medication for pain. 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  Past Medical History  Diagnosis Date  . Asthma   . Migraines   . Bone spur 2008  . Anemia   . DVT (deep venous thrombosis)    Past Surgical History  Procedure Laterality Date  . Shoulder surgery Left 2008    Bone Spur removal  . Cholecystectomy    . Gastric bypass    . Knee surgery    . Cesarean section     Family History  Problem Relation Age of Onset  . Cancer Father     oral   History  Substance Use Topics  . Smoking status: Former Research scientist (life sciences)  . Smokeless tobacco: Never Used  . Alcohol Use: Yes     Comment: less than once a week-usually only holidays/special occasions   OB History    Gravida Para Term Preterm AB TAB SAB Ectopic Multiple Living   13    13  13   3      Review of Systems  Genitourinary: Positive for vaginal bleeding.  All other systems reviewed and are negative.     Allergies  Review of patient's allergies indicates no known allergies.  Home Medications   Prior to Admission medications   Medication Sig Start Date End Date Taking? Authorizing Provider  albuterol  (PROVENTIL HFA;VENTOLIN HFA) 108 (90 BASE) MCG/ACT inhaler Inhale 2 puffs into the lungs every 6 (six) hours as needed for wheezing. 06/10/14   Thao P Le, DO  albuterol (PROVENTIL) (2.5 MG/3ML) 0.083% nebulizer solution Take 3 mLs (2.5 mg total) by nebulization every 4 (four) hours as needed for wheezing or shortness of breath. 06/10/14   Thao P Le, DO  baclofen (LIORESAL) 10 MG tablet Take 10 mg by mouth daily as needed for muscle spasms. Avoid daily use, limit to 1-2 days per week 12/21/14   Historical Provider, MD  ibuprofen (ADVIL,MOTRIN) 600 MG tablet Take 1 tablet (600 mg total) by mouth every 6 (six) hours as needed. 11/26/14   Doy Hutching, MD  oxyCODONE-acetaminophen (PERCOCET/ROXICET) 5-325 MG per tablet Take 1-2 tablets by mouth every 6 (six) hours as needed for severe pain. Patient not taking: Reported on 02/16/2015 02/11/15   Carmin Muskrat, MD  XARELTO STARTER PACK 15 & 20 MG TBPK Take 15-20 mg by mouth as directed. Take as directed on package: Start with one 15mg  tablet by mouth twice a day with food. On Day 22, switch to one 20mg  tablet once a day with food. 02/07/15   Noemi Chapel, MD  zonisamide (ZONEGRAN) 25 MG capsule Take 100 mg by mouth at bedtime. Increase by 25 mg weekly as  directed 12/21/14   Historical Provider, MD   BP 125/83 mmHg  Pulse 70  Temp(Src) 98.2 F (36.8 C) (Oral)  Resp 16  Wt 236 lb (107.049 kg)  SpO2 100%  LMP 02/24/2015 Physical Exam  Nursing note and vitals reviewed.  44 year old female, resting comfortably and in no acute distress. Vital signs are normal. Oxygen saturation is 100%, which is normal. Head is normocephalic and atraumatic. PERRLA, EOMI. Oropharynx is clear. Neck is nontender and supple without adenopathy or JVD. Back is nontender and there is no CVA tenderness. Lungs are clear without rales, wheezes, or rhonchi. Chest is nontender. Heart has regular rate and rhythm without murmur. Abdomen is soft, flat, nontender without masses or  hepatosplenomegaly and peristalsis is normoactive. Pelvic: Normal external female genitalia. Small amount of blood present in the vaginal vault without active bleeding. Fundus is retroverted-difficult to assess size. No adnexal masses or tenderness. No cervical motion tenderness. Extremities have no cyanosis or edema, full range of motion is present. Skin is warm and dry without rash. Neurologic: Mental status is normal, cranial nerves are intact, there are no motor or sensory deficits.  ED Course  Procedures (including critical care time) Labs Review Results for orders placed or performed during the hospital encounter of 02/27/15  CBC with Differential  Result Value Ref Range   WBC 6.6 4.0 - 10.5 K/uL   RBC 4.40 3.87 - 5.11 MIL/uL   Hemoglobin 12.3 12.0 - 15.0 g/dL   HCT 37.1 36.0 - 46.0 %   MCV 84.3 78.0 - 100.0 fL   MCH 28.0 26.0 - 34.0 pg   MCHC 33.2 30.0 - 36.0 g/dL   RDW 14.6 11.5 - 15.5 %   Platelets 258 150 - 400 K/uL   Neutrophils Relative % 45 43 - 77 %   Neutro Abs 2.9 1.7 - 7.7 K/uL   Lymphocytes Relative 46 12 - 46 %   Lymphs Abs 3.1 0.7 - 4.0 K/uL   Monocytes Relative 4 3 - 12 %   Monocytes Absolute 0.2 0.1 - 1.0 K/uL   Eosinophils Relative 5 0 - 5 %   Eosinophils Absolute 0.3 0.0 - 0.7 K/uL   Basophils Relative 0 0 - 1 %   Basophils Absolute 0.0 0.0 - 0.1 K/uL  Pregnancy, urine  Result Value Ref Range   Preg Test, Ur NEGATIVE NEGATIVE  Urinalysis, Routine w reflex microscopic (not at [facility])  Result Value Ref Range   Color, Urine YELLOW YELLOW   APPearance CLOUDY (A) CLEAR   Specific Gravity, Urine 1.026 1.005 - 1.030   pH 5.5 5.0 - 8.0   Glucose, UA NEGATIVE NEGATIVE mg/dL   Hgb urine dipstick LARGE (A) NEGATIVE   Bilirubin Urine NEGATIVE NEGATIVE   Ketones, ur NEGATIVE NEGATIVE mg/dL   Protein, ur NEGATIVE NEGATIVE mg/dL   Urobilinogen, UA 1.0 0.0 - 1.0 mg/dL   Nitrite NEGATIVE NEGATIVE   Leukocytes, UA MODERATE (A) NEGATIVE  Protime-INR  Result Value  Ref Range   Prothrombin Time 14.3 11.6 - 15.2 seconds   INR 1.09 0.00 - 1.49  Urine microscopic-add on  Result Value Ref Range   Squamous Epithelial / LPF MANY (A) RARE   WBC, UA 11-20 <3 WBC/hpf   RBC / HPF 3-6 <3 RBC/hpf   Bacteria, UA RARE RARE   Urine-Other TRICHOMONAS PRESENT   I-Stat beta hCG blood, ED ([facility], [facility], [facility] only)  Result Value Ref Range   I-stat hCG, quantitative <5.0 <5 mIU/mL   Comment 3  I-Stat Chem 8, ED  Result Value Ref Range   Sodium 142 135 - 145 mmol/L   Potassium 4.1 3.5 - 5.1 mmol/L   Chloride 108 101 - 111 mmol/L   BUN 15 6 - 20 mg/dL   Creatinine, Ser 1.00 0.44 - 1.00 mg/dL   Glucose, Bld 76 65 - 99 mg/dL   Calcium, Ion 1.12 1.12 - 1.23 mmol/L   TCO2 23 0 - 100 mmol/L   Hemoglobin 12.6 12.0 - 15.0 g/dL   HCT 37.0 36.0 - 46.0 %  Sample to Blood Bank  Result Value Ref Range   Blood Bank Specimen SAMPLE AVAILABLE FOR TESTING    Sample Expiration 02/28/2015    MDM   Final diagnoses:  Menorrhagia with regular cycle  Anticoagulated by anticoagulation treatment    Menstrual flow heavier than baseline but not excessive in patient who is anticoagulated on rivaroxaban. Given that the amount of bleeding does not appear to be excessive, I do not feel that hormonal manipulation is indicated at this point. However, since this is her second episode of unprovoked DVT, I am not sure how long she will need to be anticoagulated. Discussion will need to be held between her PCP and her gynecologist and regarding whether she should have some hormonal manipulation to minimize her menstrual flow. She is discharged with prescription for tramadol.    Delora Fuel, MD 61/84/85 [id number]

## 2015-02-27 NOTE — ED Notes (Signed)
Pt. Left with all belongings and refused wheelchair 

## 2015-02-27 NOTE — ED Notes (Signed)
The pt is c/o heavy vaginal bleeding since she was started on xarelto  For rt dvt.  lmp now bleeding is heavy

## 2015-02-27 NOTE — Discharge Instructions (Signed)
Have your primary care provider and your gynecologist coordinate how to manage your heavy periods.  Abnormal Uterine Bleeding Abnormal uterine bleeding can affect women at various stages in life, including teenagers, women in their reproductive years, pregnant women, and women who have reached menopause. Several kinds of uterine bleeding are considered abnormal, including:  Bleeding or spotting between periods.   Bleeding after sexual intercourse.   Bleeding that is heavier or more than normal.   Periods that last longer than usual.  Bleeding after menopause.  Many cases of abnormal uterine bleeding are minor and simple to treat, while others are more serious. Any type of abnormal bleeding should be evaluated by your health care provider. Treatment will depend on the cause of the bleeding. HOME CARE INSTRUCTIONS Monitor your condition for any changes. The following actions may help to alleviate any discomfort you are experiencing:  Avoid the use of tampons and douches as directed by your health care provider.  Change your pads frequently. You should get regular pelvic exams and Pap tests. Keep all follow-up appointments for diagnostic tests as directed by your health care provider.  SEEK MEDICAL CARE IF:   Your bleeding lasts more than 1 week.   You feel dizzy at times.  SEEK IMMEDIATE MEDICAL CARE IF:   You pass out.   You are changing pads every 15 to 30 minutes.   You have abdominal pain.  You have a fever.   You become sweaty or weak.   You are passing large blood clots from the vagina.   You start to feel nauseous and vomit. MAKE SURE YOU:   Understand these instructions.  Will watch your condition.  Will get help right away if you are not doing well or get worse. Document Released: 07/31/2005 Document Revised: 08/05/2013 Document Reviewed: 02/27/2013 [facility] Patient Information 2015 [place], [place]. This information is not intended to replace advice  given to you by your health care provider. Make sure you discuss any questions you have with your health care provider.  Tramadol tablets What is this medicine? TRAMADOL (TRA ma dole) is a pain reliever. It is used to treat moderate to severe pain in adults. This medicine may be used for other purposes; ask your health care provider or pharmacist if you have questions. COMMON BRAND NAME(S): Ultram What should I tell my health care provider before I take this medicine? They need to know if you have any of these conditions: -brain tumor -depression -drug abuse or addiction -head injury -if you frequently drink alcohol containing drinks -kidney disease or trouble passing urine -liver disease -lung disease, asthma, or breathing problems -seizures or epilepsy -suicidal thoughts, plans, or attempt; a previous suicide attempt by you or a family member -an unusual or allergic reaction to tramadol, codeine, other medicines, foods, dyes, or preservatives -pregnant or trying to get pregnant -breast-feeding How should I use this medicine? Take this medicine by mouth with a full glass of water. Follow the directions on the prescription label. If the medicine upsets your stomach, take it with food or milk. Do not take more medicine than you are told to take. Talk to your pediatrician regarding the use of this medicine in children. Special care may be needed. Overdosage: If you think you have taken too much of this medicine contact a poison control center or emergency room at once. NOTE: This medicine is only for you. Do not share this medicine with others. What if I miss a dose? If you miss a dose, take  it as soon as you can. If it is almost time for your next dose, take only that dose. Do not take double or extra doses. What may interact with this medicine? Do not take this medicine with any of the following medications: -MAOIs like Carbex, Eldepryl, Marplan, Nardil, and Parnate This medicine may  also interact with the following medications: -alcohol or medicines that contain alcohol -antihistamines -benzodiazepines -bupropion -carbamazepine or oxcarbazepine -clozapine -cyclobenzaprine -digoxin -furazolidone -linezolid -medicines for depression, anxiety, or psychotic disturbances -medicines for migraine headache like almotriptan, eletriptan, frovatriptan, naratriptan, rizatriptan, sumatriptan, zolmitriptan -medicines for pain like pentazocine, buprenorphine, butorphanol, meperidine, nalbuphine, and propoxyphene -medicines for sleep -muscle relaxants -naltrexone -phenobarbital -phenothiazines like perphenazine, thioridazine, chlorpromazine, mesoridazine, fluphenazine, prochlorperazine, promazine, and trifluoperazine -procarbazine -warfarin This list may not describe all possible interactions. Give your health care provider a list of all the medicines, herbs, non-prescription drugs, or dietary supplements you use. Also tell them if you smoke, drink alcohol, or use illegal drugs. Some items may interact with your medicine. What should I watch for while using this medicine? Tell your doctor or health care professional if your pain does not go away, if it gets worse, or if you have new or a different type of pain. You may develop tolerance to the medicine. Tolerance means that you will need a higher dose of the medicine for pain relief. Tolerance is normal and is expected if you take this medicine for a long time. Do not suddenly stop taking your medicine because you may develop a severe reaction. Your body becomes used to the medicine. This does NOT mean you are addicted. Addiction is a behavior related to getting and using a drug for a non-medical reason. If you have pain, you have a medical reason to take pain medicine. Your doctor will tell you how much medicine to take. If your doctor wants you to stop the medicine, the dose will be slowly lowered over time to avoid any side  effects. You may get drowsy or dizzy. Do not drive, use machinery, or do anything that needs mental alertness until you know how this medicine affects you. Do not stand or sit up quickly, especially if you are an older patient. This reduces the risk of dizzy or fainting spells. Alcohol can increase or decrease the effects of this medicine. Avoid alcoholic drinks. You may have constipation. Try to have a bowel movement at least every 2 to 3 days. If you do not have a bowel movement for 3 days, call your doctor or health care professional. Your mouth may get dry. Chewing sugarless gum or sucking hard candy, and drinking plenty of water may help. Contact your doctor if the problem does not go away or is severe. What side effects may I notice from receiving this medicine? Side effects that you should report to your doctor or health care professional as soon as possible: -allergic reactions like skin rash, itching or hives, swelling of the face, lips, or tongue -breathing difficulties, wheezing -confusion -itching -light headedness or fainting spells -redness, blistering, peeling or loosening of the skin, including inside the mouth -seizures Side effects that usually do not require medical attention (report to your doctor or health care professional if they continue or are bothersome): -constipation -dizziness -drowsiness -headache -nausea, vomiting This list may not describe all possible side effects. Call your doctor for medical advice about side effects. You may report side effects to FDA at 1-800-FDA-1088. Where should I keep my medicine? Keep out of the reach of  children. Store at room temperature between 15 and 30 degrees C (59 and 86 degrees F). Keep container tightly closed. Throw away any unused medicine after the expiration date. NOTE: This sheet is a summary. It may not cover all possible information. If you have questions about this medicine, talk to your doctor, pharmacist, or health  care provider.  2015, Elsevier/Gold Standard. (2010-04-13 11:55:44)

## 2015-03-11 ENCOUNTER — Inpatient Hospital Stay: Payer: Self-pay | Admitting: Family Medicine

## 2015-03-15 ENCOUNTER — Ambulatory Visit: Payer: BLUE CROSS/BLUE SHIELD | Attending: Family Medicine | Admitting: Family Medicine

## 2015-03-15 ENCOUNTER — Encounter: Payer: Self-pay | Admitting: Family Medicine

## 2015-03-15 VITALS — BP 113/69 | HR 78 | Temp 98.6°F | Resp 16 | Ht 63.25 in | Wt 232.0 lb

## 2015-03-15 DIAGNOSIS — I82401 Acute embolism and thrombosis of unspecified deep veins of right lower extremity: Secondary | ICD-10-CM | POA: Diagnosis not present

## 2015-03-15 DIAGNOSIS — Z0000 Encounter for general adult medical examination without abnormal findings: Secondary | ICD-10-CM

## 2015-03-15 DIAGNOSIS — Z87891 Personal history of nicotine dependence: Secondary | ICD-10-CM | POA: Insufficient documentation

## 2015-03-15 DIAGNOSIS — Z86718 Personal history of other venous thrombosis and embolism: Secondary | ICD-10-CM | POA: Diagnosis present

## 2015-03-15 DIAGNOSIS — Z114 Encounter for screening for human immunodeficiency virus [HIV]: Secondary | ICD-10-CM | POA: Diagnosis not present

## 2015-03-15 DIAGNOSIS — Z111 Encounter for screening for respiratory tuberculosis: Secondary | ICD-10-CM

## 2015-03-15 DIAGNOSIS — Z23 Encounter for immunization: Secondary | ICD-10-CM

## 2015-03-15 LAB — CBC
HEMATOCRIT: 36.7 % (ref 36.0–46.0)
HEMOGLOBIN: 11.7 g/dL — AB (ref 12.0–15.0)
MCH: 26.8 pg (ref 26.0–34.0)
MCHC: 31.9 g/dL (ref 30.0–36.0)
MCV: 84 fL (ref 78.0–100.0)
MPV: 10.1 fL (ref 8.6–12.4)
PLATELETS: 229 10*3/uL (ref 150–400)
RBC: 4.37 MIL/uL (ref 3.87–5.11)
RDW: 14.4 % (ref 11.5–15.5)
WBC: 9.9 10*3/uL (ref 4.0–10.5)

## 2015-03-15 LAB — BASIC METABOLIC PANEL
BUN: 12 mg/dL (ref 7–25)
CO2: 26 mmol/L (ref 20–31)
CREATININE: 1.02 mg/dL (ref 0.50–1.10)
Calcium: 8.7 mg/dL (ref 8.6–10.2)
Chloride: 106 mmol/L (ref 98–110)
Glucose, Bld: 83 mg/dL (ref 65–99)
Potassium: 3.8 mmol/L (ref 3.5–5.3)
SODIUM: 141 mmol/L (ref 135–146)

## 2015-03-15 MED ORDER — RIVAROXABAN 20 MG PO TABS: 20.0000 mg | ORAL_TABLET | Freq: Every day | ORAL | Status: DC

## 2015-03-15 NOTE — Progress Notes (Signed)
   Subjective:    Patient ID: Yesenia Wheeler, female    DOB: 29-Jan-1971, 44 y.o.   MRN: [id number] CC: HFU DVT HPI 44 yo F presents to establish care and discuss the following:  1. 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 Started on xarelto. Missed initial hospital follow up appt and has ran out xarelto. 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 She denies calf tenderness. She denies CP or SOB.   2. Requesting CXR: has hx of BCG immunization. Starting at a new job. Requesting CXR for TB screen.  3. Healthcare maintenance: due for Tdap, pap and flu shot.   History  Substance Use Topics  . Smoking status: Former Research scientist (life sciences)  . Smokeless tobacco: Never Used  . Alcohol Use: Yes     Comment: less than once a week-usually only holidays/special occasions   Past Medical History  Diagnosis Date  . Asthma   . Migraines   . Bone spur 2008  . Anemia   . 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 Shoulder surgery Left 2008    Bone Spur removal  . Cholecystectomy    . Gastric bypass    . Knee surgery    . Cesarean section     Review of Systems  Constitutional: Negative for fever and chills.  Respiratory: Negative for shortness of breath.   Cardiovascular: Negative for chest pain.  Gastrointestinal: Negative for abdominal pain and blood in stool.  Skin: Negative for rash.  Psychiatric/Behavioral: Negative for suicidal ideas and dysphoric mood.      Objective:   Physical Exam BP 113/69 mmHg  Pulse 78  Temp(Src) 98.6 F (37 C) (Oral)  Resp 16  Ht 5' 3.25" (1.607 m)  Wt 232 lb (105.235 kg)  BMI 40.75 kg/m2  SpO2 98%  LMP 02/24/2015 General appearance: alert, cooperative, no distress and moderately obese Lungs: clear to auscultation bilaterally Heart: regular rate and rhythm, S1, S2 normal, no murmur, click, rub or  gallop Extremities: edema trace edema. No calf tenderness     Assessment & Plan:

## 2015-03-15 NOTE — Assessment & Plan Note (Signed)
Screening HIV ordered  

## 2015-03-15 NOTE — Patient Instructions (Signed)
Mrs. Klatt,  Thank you for coming in today. It was a pleasure meeting you. I look forward to being your primary doctor.  1. 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 TB screening: CXR ordered, please have it done at your convenience  3. Healthcare maintenance: Screening HIV and Tdap done today  F/u in 4-6 weeks for pap smear  Dr. Adrian Blackwater

## 2015-03-15 NOTE — Progress Notes (Signed)
Establish Care HFU DVT

## 2015-03-15 NOTE — Assessment & Plan Note (Addendum)
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

## 2015-03-15 NOTE — Assessment & Plan Note (Signed)
TB screening: CXR ordered, please have it done at your convenience

## 2015-03-16 ENCOUNTER — Encounter: Payer: Self-pay | Admitting: Family Medicine

## 2015-03-16 ENCOUNTER — Telehealth: Payer: Self-pay | Admitting: *Deleted

## 2015-03-16 ENCOUNTER — Ambulatory Visit (HOSPITAL_COMMUNITY)
Admission: RE | Admit: 2015-03-16 | Discharge: 2015-03-16 | Disposition: A | Discharge status: Home / Self Care | Payer: BLUE CROSS/BLUE SHIELD | Source: Ambulatory Visit | Attending: Family Medicine | Admitting: Family Medicine

## 2015-03-16 DIAGNOSIS — Z111 Encounter for screening for respiratory tuberculosis: Secondary | ICD-10-CM | POA: Diagnosis present

## 2015-03-16 LAB — HIV ANTIBODY (ROUTINE TESTING W REFLEX): HIV 1&2 Ab, 4th Generation: NONREACTIVE

## 2015-03-16 NOTE — Telephone Encounter (Signed)
LVM to return call   Letter at front office

## 2015-03-16 NOTE — Telephone Encounter (Signed)
-----   Message from Boykin Nearing, MD sent at 03/16/2015  2:50 PM EDT ----- Normal CXR Result note ready for pick up

## 2015-03-19 ENCOUNTER — Telehealth: Payer: Self-pay | Admitting: General Practice

## 2015-03-19 DIAGNOSIS — I82401 Acute embolism and thrombosis of unspecified deep veins of right lower extremity: Secondary | ICD-10-CM

## 2015-03-19 NOTE — Telephone Encounter (Signed)
Patient is trying to get a refill on her rivaroxaban (XARELTO) 20 MG TABS tablet but it is needing a prior authorization for her insurance [employer]. Patient has not had her medication for 4 days now. Please follow up with patient.

## 2015-03-22 MED ORDER — WARFARIN SODIUM 5 MG PO TABS: 5.0000 mg | ORAL_TABLET | Freq: Every day | ORAL | Status: DC

## 2015-03-22 NOTE — Telephone Encounter (Signed)
Patient called Insurance will not cover [place] to transition to coumadin Patient to come in tomorrow for INR, CBC, coumdin initiation with teaching for recurrent DVT.  recommend 5 mg daily coumadin to start with, this has been ordered and sent to patient pharmacy   Patient on coumadin clinic schedule as my schedule is full but I will be in and available for consult if needed

## 2015-03-22 NOTE — Telephone Encounter (Signed)
Nurse called patient, patient verified date of birth. Patient explains she has not tried any alternatives as requested by [place]. She was started on xarelto on 02/07/15. Nurse will consult with Dr. Adrian Blackwater.

## 2015-03-22 NOTE — Telephone Encounter (Signed)
Patient called nurse, nurse verified date of birth. Patient needs PA for xarelto. Patient is currently out of xarelto. Nurse went to [employer], printed PA form. Will send form to Dr. Adrian Blackwater for [place].

## 2015-03-23 ENCOUNTER — Ambulatory Visit: Payer: BLUE CROSS/BLUE SHIELD | Attending: Family Medicine | Admitting: Pharmacist

## 2015-03-23 DIAGNOSIS — I82401 Acute embolism and thrombosis of unspecified deep veins of right lower extremity: Secondary | ICD-10-CM

## 2015-03-23 LAB — POCT INR: INR: 1

## 2015-03-23 NOTE — Patient Instructions (Signed)
Warfarin: What You Need to Know Warfarin is an anticoagulant. Anticoagulants help prevent the formation of blood clots. They also help stop the growth of blood clots. Warfarin is sometimes referred to as a "blood thinner."  Normally, when body tissues are cut or damaged, the blood clots in order to prevent blood loss. Sometimes clots form inside your blood vessels and obstruct the flow of blood through your circulatory system (thrombosis). These clots may travel through your bloodstream and become lodged in smaller blood vessels in your brain, which can cause a stroke, or in your lungs (pulmonary embolism). WHO SHOULD USE WARFARIN? Warfarin is prescribed for people at risk of developing harmful blood clots:  People with surgically implanted mechanical heart valves, irregular heart rhythms called atrial fibrillation, and certain clotting disorders.  People who have developed harmful blood clotting in the past, including those who have had a stroke or a pulmonary embolism, or thrombosis in their legs (deep vein thrombosis [DVT]).  People with an existing blood clot, such as a pulmonary embolism. WARFARIN DOSING Warfarin tablets come in different strengths. Each tablet strength is a different color, with the amount of warfarin (in milligrams) clearly printed on the tablet. If the color of your tablet is different than usual when you receive a new prescription, report it immediately to your pharmacist or health care provider. WARFARIN MONITORING The goal of warfarin therapy is to lessen the clotting tendency of blood but not prevent clotting completely. Your health care provider will monitor the anticoagulation effect of warfarin closely and adjust your dose as needed. For your safety, blood tests called prothrombin time (PT) or international normalized ratio (INR) are used to measure the effects of warfarin. Both of these tests can be done with a finger stick or a blood draw. The longer it takes the  blood to clot, the higher the PT or INR. Your health care provider will inform you of your "target" PT or INR range. If, at any time, your PT or INR is above the target range, there is a risk of bleeding. If your PT or INR is below the target range, there is a risk of clotting. Whether you are started on warfarin while you are in the hospital or in your health care provider's office, you will need to have your PT or INR checked within one week of starting the medicine. Initially, some people are asked to have their PT or INR checked as much as twice a week. Once you are on a stable maintenance dose, the PT or INR is checked less often, usually once every 2 to 4 weeks. The warfarin dose may be adjusted if the PT or INR is not within the target range. It is important to keep all laboratory and health care provider follow-up appointments. Not keeping appointments could result in a chronic or permanent injury, pain, or disability because warfarin is a medicine that requires close monitoring. WHAT ARE THE SIDE EFFECTS OF WARFARIN?  Too much warfarin can cause bleeding (hemorrhage) from any part of the body. This may include bleeding from the gums, blood in the urine, bloody or dark stools, a nosebleed that is not easily stopped, coughing up blood, or vomiting blood.  Too little warfarin can increase the risk of blood clots.  Too little or too much warfarin can also increase the risk of a stroke.  Warfarin use may cause a skin rash or irritation, an unusual fever, continual nausea or stomach upset, or severe pain in your joints or back.   SPECIAL PRECAUTIONS WHILE TAKING WARFARIN Warfarin should be taken exactly as directed. It is very important to take warfarin as directed since bleeding or blood clots could result in chronic or permanent injury, pain, or disability.  Take your medicine at the same time every day. If you forget to take your dose, you can take it if it is within 6 hours of when it was  due.  Do not change the dose of warfarin on your own to make up for missed or extra doses.  If you miss more than 2 doses in a row, you should contact your health care provider for advice. Avoid situations that cause bleeding. You may have a tendency to bleed more easily than usual while taking warfarin. The following actions can limit bleeding:  Using a softer toothbrush.  Flossing with waxed floss rather than unwaxed floss.  Shaving with an electric razor rather than a blade.  Limiting the use of sharp objects.  Avoiding potentially harmful activities, such as contact sports. Warfarin and Pregnancy or Breastfeeding  Warfarin is not advised during the first trimester of pregnancy due to an increased risk of birth defects. In certain situations, a woman may take warfarin after her first trimester of pregnancy. A woman who becomes pregnant or plans to become pregnant while taking warfarin should notify her health care provider immediately.  Although warfarin does not pass into breast milk, a woman who wishes to breastfeed while taking warfarin should also consult with her health care provider. Alcohol, Smoking, and Illicit Drug Use  Alcohol affects how warfarin works in the body. It is best to avoid alcoholic drinks or consume very small amounts while taking warfarin. In general, alcohol intake should be limited to 1 oz (30 mL) of liquor, 6 oz (180 mL) of wine, or 12 oz (360 mL) of beer each day. Notify your health care provider if you change your alcohol intake.  Smoking affects how warfarin works. It is best to avoid smoking while taking warfarin. Notify your health care provider if you change your smoking habits.  It is best to avoid all illicit drugs while taking warfarin since there are few studies that show how warfarin interacts with these drugs. Other Medicines and Dietary Supplements Many prescription and over-the-counter medicines can interfere with warfarin. Be sure all of your  health care providers know you are taking warfarin. Notify your health care provider who prescribed warfarin for you or your pharmacist before starting or stopping any new medicines, including over-the-counter vitamins, dietary supplements, and pain medicines. Your warfarin dose may need to be adjusted. Some common over-the-counter medicines that may increase the risk of bleeding while taking warfarin include:   Acetaminophen.  Aspirin.  Nonsteroidal anti-inflammatory medicines (NSAIDs), such as ibuprofen or naproxen.  Vitamin E. Dietary Considerations  Foods that have moderate or high amounts of vitamin K can interfere with warfarin. Avoid major changes in your diet or notify your health care provider before changing your diet. Eat a consistent amount of foods that have moderate or high amounts of vitamin K. Eating less foods containing vitamin K can increase the risk of bleeding. Eating more foods containing vitamin K can increase the risk of blood clots. Additional questions about dietary considerations can be discussed with a dietitian. Foods that are very high in vitamin K:  Greens, such as Swiss chard and beet, collard, mustard, or turnip greens (fresh or frozen, cooked).  Kale (fresh or frozen, cooked).  Parsley (raw).  Spinach (cooked). Foods that are high   in vitamin K:  Asparagus (frozen, cooked).  Beans, green (frozen, cooked).  Broccoli.  Bok choy (cooked).  Brussels sprouts (fresh or frozen, cooked).  Cabbage (cooked).   Coleslaw. Foods that are moderately high in vitamin K:  Blueberries.  Black-eyed peas.  Endive (raw).  Green leaf lettuce (raw).  Green scallions (raw).  Kale (raw).  Okra (frozen, cooked).  Plantains (fried).  Romaine lettuce (raw).  Sauerkraut (canned).  Spinach (raw). CALL YOUR CLINIC OR HEALTH CARE PROVIDER IF YOU:  Plan to have any surgery or procedure.  Feel sick, especially if you have diarrhea or  vomiting.  Experience or anticipate any major changes in your diet.  Start or stop a prescription or over-the-counter medicine.  Become, plan to become, or think you may be pregnant.  Are having heavier than usual menstrual periods.  Have had a fall, accident, or any symptoms of bleeding or unusual bruising.  Develop an unusual fever. CALL 911 IN THE [place] OR GO TO THE EMERGENCY DEPARTMENT IF YOU:   Think you may be having an allergic reaction to warfarin. The signs of an allergic reaction could include itching, rash, hives, swelling, chest tightness, or trouble breathing.  See signs of blood in your urine. The signs could include reddish, pinkish, or tea-colored urine.  See signs of blood in your stools. The signs could include bright red or black stools.  Vomit or cough up blood. In these instances, the blood could have either a bright red or a "coffee-grounds" appearance.  Have bleeding that will not stop after applying pressure for 30 minutes such as cuts, nosebleeds, or other injuries.  Have severe pain in your joints or back.  Have a new and severe headache.  Have sudden weakness or numbness of your face, arm, or leg, especially on one side of your body.  Have sudden confusion or trouble understanding.  Have sudden trouble seeing in one or both eyes.  Have sudden trouble walking, dizziness, loss of balance, or coordination.  Have trouble speaking or understanding (aphasia). Document Released: 07/31/2005 Document Revised: 12/15/2013 Document Reviewed: 01/24/2013 [facility] Patient Information 2015 [place], [place]. This information is not intended to replace advice given to you by your health care provider. Make sure you discuss any questions you have with your health care provider. Warfarin tablets What is this medicine? WARFARIN (WAR far in) is an anticoagulant. It is used to treat or prevent clots in the veins, arteries, lungs, or heart. This medicine may be used for  other purposes; ask your health care provider or pharmacist if you have questions. COMMON BRAND NAME(S): Coumadin, Jantoven What should I tell my health care provider before I take this medicine? They need to know if you have any of these conditions: -alcoholism -anemia -bleeding disorders -cancer -diabetes -heart disease -high blood pressure -history of bleeding in the gastrointestinal tract -history of stroke or other brain injury or disease -kidney or liver disease -protein C deficiency -protein S deficiency -psychosis or dementia -recent injury, recent or planned surgery or procedure -an unusual or allergic reaction to warfarin, other medicines, foods, dyes, or preservatives -pregnant or trying to get pregnant -breast-feeding How should I use this medicine? Take this medicine by mouth with a glass of water. Follow the directions on the prescription label. You can take this medicine with or without food. Take your medicine at the same time each day. Do not take it more often than directed. Do not stop taking except on your doctor's advice. Stopping this medicine may increase  your risk of a blood clot. Be sure to refill your prescription before you run out of medicine. If your doctor or healthcare professional calls to change your dose, write down the dose and any other instructions. Always read the dose and instructions back to him or her to make sure you understand them. Tell your doctor or healthcare professional what strength of tablets you have on hand. Ask how many tablets you should take to equal your new dose. Write the date on the new instructions and keep them near your medicine. If you are told to stop taking your medicine until your next blood test, call your doctor or healthcare professional if you do not hear anything within 24 hours of the test to find out your new dose or when to restart your prior dose. A special MedGuide will be given to you by the pharmacist with each  prescription and refill. Be sure to read this information carefully each time. Talk to your pediatrician regarding the use of this medicine in children. Special care may be needed. Overdosage: If you think you have taken too much of this medicine contact a poison control center or emergency room at once. NOTE: This medicine is only for you. Do not share this medicine with others. What if I miss a dose? It is important not to miss a dose. If you miss a dose, call your healthcare provider. Take the dose as soon as possible on the same day. If it is almost time for your next dose, take only that dose. Do not take double or extra doses to make up for a missed dose. What may interact with this medicine? Do not take this medicine with any of the following medications: -agents that prevent or dissolve blood clots -aspirin or other salicylates -danshen -dextrothyroxine -mifepristone -St. John's Wort -red yeast rice This medicine may also interact with the following medications: -acetaminophen -agents that lower cholesterol -alcohol -allopurinol -amiodarone -antibiotics or medicines for treating bacterial, fungal or viral infections -azathioprine -barbiturate medicines for inducing sleep or treating seizures -certain medicines for diabetes -certain medicines for heart rhythm problems -certain medicines for high blood pressure -chloral hydrate -cisapride -disulfiram -female hormones, including contraceptive or birth control pills -general anesthetics -herbal or dietary products like garlic, ginkgo, ginseng, green tea, or kava kava -influenza virus vaccine -female hormones -medicines for mental depression or psychosis -medicines for some types of cancer -medicines for stomach problems -methylphenidate -NSAIDs, medicines for pain and inflammation, like ibuprofen or naproxen -propoxyphene -quinidine, quinine -raloxifene -seizure or epilepsy medicine like carbamazepine, phenytoin, and  valproic acid -steroids like cortisone and prednisone -tamoxifen -thyroid medicine -tramadol -vitamin c, vitamin e, and vitamin K -zafirlukast -zileuton This list may not describe all possible interactions. Give your health care provider a list of all the medicines, herbs, non-prescription drugs, or dietary supplements you use. Also tell them if you smoke, drink alcohol, or use illegal drugs. Some items may interact with your medicine. What should I watch for while using this medicine? Visit your doctor or health care professional for regular checks on your progress. You will need to have a blood test called a PT/INR regularly. The PT/INR blood test is done to make sure you are getting the right dose of this medicine. It is important to not miss your appointment for the blood tests. When you first start taking this medicine, these tests are done often. Once the correct dose is determined and you take your medicine properly, these tests can be done less often.  Notify your doctor or health care professional and seek emergency treatment if you develop breathing problems; changes in vision; chest pain; severe, sudden headache; pain, swelling, warmth in the leg; trouble speaking; sudden numbness or weakness of the face, arm or leg. These can be signs that your condition has gotten worse. While you are taking this medicine, carry an identification card with your name, the name and dose of medicine(s) being used, and the name and phone number of your doctor or health care professional or person to contact in an emergency. Do not start taking or stop taking any medicines or over-the-counter medicines except on the advice of your doctor or health care professional. You should discuss your diet with your doctor or health care professional. Do not make major changes in your diet. Vitamin K can affect how well this medicine works. Many foods contain vitamin K. It is important to eat a consistent amount of foods  with vitamin K. Other foods with vitamin K that you should eat in consistent amounts are asparagus, basil, beef or pork liver, black eyed peas, broccoli, brussel sprouts, cabbage, chickpeas, cucumber with peel, green onions, green tea, okra, parsley, peas, thyme, and green leafy vegetables like beet greens, collard greens, endive, kale, mustard greens, spinach, turnip greens, watercress, or certain lettuces like green leaf or romaine. This medicine can cause birth defects or bleeding in an unborn child. Women of childbearing age should use effective birth control while taking this medicine. If a woman becomes pregnant while taking this medicine, she should discuss the potential risks and her options with her health care professional. Avoid sports and activities that might cause injury while you are using this medicine. Severe falls or injuries can cause unseen bleeding. Be careful when using sharp tools or knives. Consider using an Copy. Take special care brushing or flossing your teeth. Report any injuries, bruising, or red spots on the skin to your doctor or health care professional. If you have an illness that causes vomiting, diarrhea, or fever for more than a few days, contact your doctor. Also check with your doctor if you are unable to eat for several days. These problems can change the effect of this medicine. Even after you stop taking this medicine, it takes several days before your body recovers its normal ability to clot blood. Ask your doctor or health care professional how long you need to be careful. If you are going to have surgery or dental work, tell your doctor or health care professional that you have been taking this medicine. What side effects may I notice from receiving this medicine? Side effects that you should report to your doctor or health care professional as soon as possible: -back pain -chills -dizziness -fever -heavy menstrual bleeding or vaginal  bleeding -painful, blue, or purple toes -painful, prolonged erection -signs and symptoms of bleeding such as bloody or black, tarry stools; red or dark-brown urine; spitting up blood or brown material that looks like coffee grounds; red spots on the skin; unusual bruising or bleeding from the eye, gums, or nose-skin rash, itching or skin damage -stomach pain -unusually weak or tired -yellowing of skin or eyes Side effects that usually do not require medical attention (report to your doctor or health care professional if they continue or are bothersome): -diarrhea -hair loss This list may not describe all possible side effects. Call your doctor for medical advice about side effects. You may report side effects to FDA at 1-800-FDA-1088. Where should I keep my  medicine? Keep out of the reach of children. Store at room temperature between 15 and 30 degrees C (59 and 86 degrees F). Protect from light. Throw away any unused medicine after the expiration date. Do not flush down the toilet. NOTE: This sheet is a summary. It may not cover all possible information. If you have questions about this medicine, talk to your doctor, pharmacist, or health care provider.  2015, [employer]. (2013-02-19 12:17:56) Vitamin K Foods and Warfarin Warfarin is a medicine that helps prevent harmful blood clots by causing blood to clot more slowly. It does this by decreasing the activity of vitamin K, which promotes normal blood clotting. For the dose of warfarin you have been prescribed to work well, you need to get about the same amount of vitamin K from your food from day to day. Suddenly getting a lot more vitamin K could cause your blood to clot too quickly. A sudden decrease in vitamin K intake could cause your blood to clot too slowly. These changes in vitamin K intake could lead to dangerous blood clotsor to bleeding. WHAT GENERAL GUIDELINES DO I NEED TO FOLLOW?  Keep your intake of vitamin K  consistent from day to day. To do this, you must be aware of which foods contain moderate or high amounts of vitamin K. Listed below are some foods that are very high, high, or moderately high in vitamin K. If you eat these foods, make sure you eat a consistent amount of them from day to day.  Avoid major changes in your diet, or tell your health care provider before changing your diet.  If you take a multivitamin that contains vitamin K, be sure to take it every day.  If you drink green tea, drink the same amount each day. WHAT FOODS ARE VERY HIGH IN VITAMIN K?   Greens, such as Swiss chard and beet, collard, mustard, or turnip greens (fresh or frozen, cooked).  Kale (fresh or frozen, cooked).   Parsley (raw).  Spinach (cooked).  WHAT FOODS ARE HIGH IN VITAMIN K?  Asparagus (frozen, cooked).  Beans, green (frozen, cooked).  Broccoli.   Bok choy (cooked).   Brussels sprouts (fresh or frozen, cooked).  Cabbage (cooked).  Coleslaw. WHAT FOODS ARE MODERATELY HIGH IN VITAMIN K?  Blueberries.  Black-eyed peas.  Endive (raw).   Green leaf lettuce (raw).   Green scallions (raw).  Kale (raw).  Okra (frozen, cooked).  Plantains (fried).  Romaine lettuce (raw).   Sauerkraut (canned).   Spinach (raw). Document Released: 05/28/2009 Document Revised: 08/05/2013 Document Reviewed: 06/04/2013 [facility] Patient Information 2015 [place], [place]. This information is not intended to replace advice given to you by your health care provider. Make sure you discuss any questions you have with your health care provider.

## 2015-03-23 NOTE — Progress Notes (Signed)
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  Pt was unable to obtain prior auth for xarelto and completed xarelto starter pack ~03/16/15.  Pts initial INR 1.0 today and will initiate Warfarin 5 mg PO daily.  Counseled pt on s/sx of bleeding, DDI including NSAIDs/supplements, frequency of INR visits and meaning of INR, and consistent diet with Vit K rich foods. Pt states would prefer Xarelto if insurance would pay. Will f/u INR in 1 week and call pts insurance company to see if prior auth of xarelto is possible.

## 2015-04-21 ENCOUNTER — Other Ambulatory Visit: Payer: Self-pay | Admitting: Pharmacist

## 2015-04-21 ENCOUNTER — Telehealth: Payer: Self-pay | Admitting: Pharmacist

## 2015-04-21 NOTE — Telephone Encounter (Signed)
Called patient to follow up on warfarin management. Patient was a no show to clinic for INR monitoring and never rescheduled the appointment.  Patient reports that she is still taking warfarin. She denies any s/sx of bleeding.  Stressed the importance of INR monitoring with warfarin due to the risk of clots or bleeding.  Patient verbalized understanding and reported that she would make an appointment when she was able to. She reported that she was "too busy" to come this week despite telling her the risks of not closely monitoring her INR.   Nicoletta Ba, PharmD, BCPS, [place] [facility] [phone]

## 2015-04-22 NOTE — Patient Outreach (Signed)
error 

## 2015-04-29 ENCOUNTER — Telehealth: Payer: Self-pay | Admitting: Pharmacist

## 2015-04-29 NOTE — Telephone Encounter (Signed)
Called patient to follow up on warfarin management. Patient was a no show to clinic for INR monitoring and did not schedule appointment after I spoke with her last week.  Patient reports that she is still taking warfarin. She denies any s/sx of bleeding.  Stressed the importance of INR monitoring with warfarin due to the risk of clots or bleeding.  She reported that she did not have insurance and that is why she did not come to clinic. However, she reported that she would have insurance May 15, 2015. I stressed the importance of coming in just for the INR check. Patient verbalized understanding and reported that she would make an appointment tomorrow.    Nicoletta Ba, PharmD, BCPS, [place] [facility] [phone]

## 2015-05-25 ENCOUNTER — Ambulatory Visit: Payer: Commercial Managed Care - PPO | Attending: Family Medicine | Admitting: Pharmacist

## 2015-05-25 DIAGNOSIS — I82401 Acute embolism and thrombosis of unspecified deep veins of right lower extremity: Secondary | ICD-10-CM

## 2015-05-25 LAB — POCT INR: INR: 1.1

## 2015-05-25 MED ORDER — RIVAROXABAN 20 MG PO TABS
20.0000 mg | ORAL_TABLET | Freq: Every day | ORAL | Status: DC
Start: 2015-05-25 — End: 2015-06-17

## 2015-05-25 MED ORDER — WARFARIN SODIUM 5 MG PO TABS: 5.0000 mg | ORAL_TABLET | Freq: Every day | ORAL | Status: DC

## 2015-05-25 NOTE — Progress Notes (Signed)
Pharmacy Anticoagulation Clinic  Subjective: Patient presents today for INR monitoring. 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   Current dose of warfarin: 5 mg daily  Adherence to warfarin: reports missed doses about two weeks ago Signs/symptoms of bleeding: denies Chest pain/SOB: denies Leg swelling/pain: denies Recent changes in diet: denies Recent changes in medications: denies Upcoming procedures that may impact anticoagulation: denies  Patient has been noncompliant with follow up with me.   Objective: Today's INR = 1.1   Assessment and Plan: Anticoagulation: Patient is Subtherapeutic based on patient's INR of 1.1 and patient's INR goal of 2-3. Will Increase current warfarin dose: 7.5 mg on Tuesdays and Thursdays and 5 mg all other days.  Patient has new insurance: [facility]. She would like to try to get on Xarelto again. I ordered Xarelto and patient to check today to see if it is covered. She will call the office to let us know either way. Instructed her to start taking the Xarelto if it is covered as her INR is <3. Otherwise, patient will continue on warfarin until we can get Xarelto approved. I definitely think that Xarelto is more appropriate for this patient as she has been noncompliant with visits and monitoring, and she is going to be on anticoagulation lifelong. Stressed the importance of keeping up with monitoring and the risks of recurrent clots or bleeding if she doesn't follow up. Pharmacy to fill warfarin only if Xarelto is not covered by new insurance card.   Patient verbalized understanding and was provided with written instructions. Next INR check planned for 1 week.

## 2015-05-27 ENCOUNTER — Telehealth: Payer: Self-pay | Admitting: Pharmacist

## 2015-05-27 NOTE — Telephone Encounter (Signed)
Contacted patient to see if she was able to start the [place].  Patient reported that she had not been to the pharmacy yet to see if she could get it. She reports that she is still taking the warfarin.  She will call me once she goes to the pharmacy today to let me know if she will take the Xarelto or continue on warfarin.   Nicoletta Ba, PharmD, BCPS, [facility] [phone]

## 2015-06-03 ENCOUNTER — Telehealth: Payer: Self-pay | Admitting: Pharmacist

## 2015-06-03 NOTE — Telephone Encounter (Signed)
Contacted patient to see if she was able to start the [place].  I was unable to reach her and I was unable to leave a message. Will attempt to reach out to her again next week.    Nicoletta Ba, PharmD, BCPS, [facility] [phone]

## 2015-06-08 ENCOUNTER — Encounter (HOSPITAL_COMMUNITY): Payer: Self-pay | Admitting: Emergency Medicine

## 2015-06-08 ENCOUNTER — Emergency Department (HOSPITAL_COMMUNITY)
Admission: EM | Admit: 2015-06-08 | Discharge: 2015-06-08 | Disposition: A | Discharge status: Home / Self Care | Payer: Commercial Managed Care - PPO | Attending: Emergency Medicine | Admitting: Emergency Medicine

## 2015-06-08 DIAGNOSIS — N390 Urinary tract infection, site not specified: Secondary | ICD-10-CM

## 2015-06-08 DIAGNOSIS — G8929 Other chronic pain: Secondary | ICD-10-CM | POA: Diagnosis not present

## 2015-06-08 DIAGNOSIS — J45909 Unspecified asthma, uncomplicated: Secondary | ICD-10-CM | POA: Diagnosis not present

## 2015-06-08 DIAGNOSIS — R350 Frequency of micturition: Secondary | ICD-10-CM | POA: Diagnosis not present

## 2015-06-08 DIAGNOSIS — M545 Low back pain: Secondary | ICD-10-CM

## 2015-06-08 DIAGNOSIS — Z86718 Personal history of other venous thrombosis and embolism: Secondary | ICD-10-CM | POA: Diagnosis not present

## 2015-06-08 DIAGNOSIS — Z8679 Personal history of other diseases of the circulatory system: Secondary | ICD-10-CM | POA: Insufficient documentation

## 2015-06-08 DIAGNOSIS — Z862 Personal history of diseases of the blood and blood-forming organs and certain disorders involving the immune mechanism: Secondary | ICD-10-CM | POA: Insufficient documentation

## 2015-06-08 DIAGNOSIS — Z7901 Long term (current) use of anticoagulants: Secondary | ICD-10-CM | POA: Diagnosis not present

## 2015-06-08 DIAGNOSIS — Z3202 Encounter for pregnancy test, result negative: Secondary | ICD-10-CM | POA: Diagnosis not present

## 2015-06-08 DIAGNOSIS — Z87891 Personal history of nicotine dependence: Secondary | ICD-10-CM | POA: Insufficient documentation

## 2015-06-08 DIAGNOSIS — Z79899 Other long term (current) drug therapy: Secondary | ICD-10-CM | POA: Diagnosis not present

## 2015-06-08 LAB — URINALYSIS, ROUTINE W REFLEX MICROSCOPIC
Bilirubin Urine: NEGATIVE
Glucose, UA: NEGATIVE mg/dL
Ketones, ur: NEGATIVE mg/dL
NITRITE: POSITIVE — AB
PH: 5.5 (ref 5.0–8.0)
Protein, ur: NEGATIVE mg/dL
SPECIFIC GRAVITY, URINE: 1.02 (ref 1.005–1.030)
Urobilinogen, UA: 0.2 mg/dL (ref 0.0–1.0)

## 2015-06-08 LAB — URINE MICROSCOPIC-ADD ON

## 2015-06-08 LAB — PREGNANCY, URINE: Preg Test, Ur: NEGATIVE

## 2015-06-08 MED ORDER — OXYCODONE-ACETAMINOPHEN 5-325 MG PO TABS
1.0000 | ORAL_TABLET | Freq: Once | ORAL | Status: AC
  Administered 2015-06-08: 1 via ORAL
  Filled 2015-06-08: qty 1

## 2015-06-08 MED ORDER — METHOCARBAMOL 500 MG PO TABS: 500.0000 mg | ORAL_TABLET | Freq: Two times a day (BID) | ORAL | Status: DC | PRN

## 2015-06-08 MED ORDER — CEPHALEXIN 500 MG PO CAPS: 500.0000 mg | ORAL_CAPSULE | Freq: Four times a day (QID) | ORAL | Status: DC

## 2015-06-08 MED ORDER — ACETAMINOPHEN 500 MG PO TABS: 500.0000 mg | ORAL_TABLET | Freq: Four times a day (QID) | ORAL | Status: DC | PRN

## 2015-06-08 NOTE — Discharge Instructions (Signed)
Urinary Tract Infection Urinary tract infections (UTIs) can develop anywhere along your urinary tract. Your urinary tract is your body's drainage system for removing wastes and extra water. Your urinary tract includes two kidneys, two ureters, a bladder, and a urethra. Your kidneys are a pair of bean-shaped organs. Each kidney is about the size of your fist. They are located below your ribs, one on each side of your spine. CAUSES Infections are caused by microbes, which are microscopic organisms, including fungi, viruses, and bacteria. These organisms are so small that they can only be seen through a microscope. Bacteria are the microbes that most commonly cause UTIs. SYMPTOMS  Symptoms of UTIs may vary by age and gender of the patient and by the location of the infection. Symptoms in young women typically include a frequent and intense urge to urinate and a painful, burning feeling in the bladder or urethra during urination. Older women and men are more likely to be tired, shaky, and weak and have muscle aches and abdominal pain. A fever may mean the infection is in your kidneys. Other symptoms of a kidney infection include pain in your back or sides below the ribs, nausea, and vomiting. DIAGNOSIS To diagnose a UTI, your caregiver will ask you about your symptoms. Your caregiver will also ask you to provide a urine sample. The urine sample will be tested for bacteria and white blood cells. White blood cells are made by your body to help fight infection. TREATMENT  Typically, UTIs can be treated with medication. Because most UTIs are caused by a bacterial infection, they usually can be treated with the use of antibiotics. The choice of antibiotic and length of treatment depend on your symptoms and the type of bacteria causing your infection. HOME CARE INSTRUCTIONS 1. If you were prescribed antibiotics, take them exactly as your caregiver instructs you. Finish the medication even if you feel better after  you have only taken some of the medication. 2. Drink enough water and fluids to keep your urine clear or pale yellow. 3. Avoid caffeine, tea, and carbonated beverages. They tend to irritate your bladder. 4. Empty your bladder often. Avoid holding urine for long periods of time. 5. Empty your bladder before and after sexual intercourse. 6. After a bowel movement, women should cleanse from front to back. Use each tissue only once. SEEK MEDICAL CARE IF:  1. You have back pain. 2. You develop a fever. 3. Your symptoms do not begin to resolve within 3 days. SEEK IMMEDIATE MEDICAL CARE IF:  1. You have severe back pain or lower abdominal pain. 2. You develop chills. 3. You have nausea or vomiting. 4. You have continued burning or discomfort with urination. MAKE SURE YOU:  1. Understand these instructions. 2. Will watch your condition. 3. Will get help right away if you are not doing well or get worse.   This information is not intended to replace advice given to you by your health care provider. Make sure you discuss any questions you have with your health care provider.   Document Released: 05/10/2005 Document Revised: 04/21/2015 Document Reviewed: 09/08/2011 Elsevier Interactive Patient Education 2016 [employer]vier Inc[employer]  Back Exercises The following exercises strengthen the muscles that help to support the back. They also help to keep the lower back flexible. Doing these exercises can help to prevent back pain or lessen existing pain. If you have back pain or discomfort, try doing these exercises 2-3 times each day or as told by your health care provider. When  the pain goes away, do them once each day, but increase the number of times that you repeat the steps for each exercise (do more repetitions). If you do not have back pain or discomfort, do these exercises once each day or as told by your health care provider. EXERCISES Single Knee to Chest Repeat these steps 3-5 times for each  leg: 7. Lie on your back on a firm bed or the floor with your legs extended. 8. Bring one knee to your chest. Your other leg should stay extended and in contact with the floor. 9. Hold your knee in place by grabbing your knee or thigh. 10. Pull on your knee until you feel a gentle stretch in your lower back. 11. Hold the stretch for 10-30 seconds. 12. Slowly release and straighten your leg. Pelvic Tilt Repeat these steps 5-10 times: 4. Lie on your back on a firm bed or the floor with your legs extended. 5. Bend your knees so they are pointing toward the ceiling and your feet are flat on the floor. 6. Tighten your lower abdominal muscles to press your lower back against the floor. This motion will tilt your pelvis so your tailbone points up toward the ceiling instead of pointing to your feet or the floor. 7. With gentle tension and even breathing, hold this position for 5-10 seconds. Cat-Cow Repeat these steps until your lower back becomes more flexible: 5. Get into a hands-and-knees position on a firm surface. Keep your hands under your shoulders, and keep your knees under your hips. You may place padding under your knees for comfort. 6. Let your head hang down, and point your tailbone toward the floor so your lower back becomes rounded like the back of a cat. 7. Hold this position for 5 seconds. 8. Slowly lift your head and point your tailbone up toward the ceiling so your back forms a sagging arch like the back of a cow. 9. Hold this position for 5 seconds. Press-Ups Repeat these steps 5-10 times: 4. Lie on your abdomen (face-down) on the floor. 5. Place your palms near your head, about shoulder-width apart. 6. While you keep your back as relaxed as possible and keep your hips on the floor, slowly straighten your arms to raise the top half of your body and lift your shoulders. Do not use your back muscles to raise your upper torso. You may adjust the placement of your hands to make yourself  more comfortable. 7. Hold this position for 5 seconds while you keep your back relaxed. 8. Slowly return to lying flat on the floor. Bridges Repeat these steps 10 times: 1. Lie on your back on a firm surface. 2. Bend your knees so they are pointing toward the ceiling and your feet are flat on the floor. 3. Tighten your buttocks muscles and lift your buttocks off of the floor until your waist is at almost the same height as your knees. You should feel the muscles working in your buttocks and the back of your thighs. If you do not feel these muscles, slide your feet 1-2 inches farther away from your buttocks. 4. Hold this position for 3-5 seconds. 5. Slowly lower your hips to the starting position, and allow your buttocks muscles to relax completely. If this exercise is too easy, try doing it with your arms crossed over your chest. Abdominal Crunches Repeat these steps 5-10 times: 1. Lie on your back on a firm bed or the floor with your legs extended. 2. Banks  your knees so they are pointing toward the ceiling and your feet are flat on the floor. 3. Cross your arms over your chest. 4. Tip your chin slightly toward your chest without bending your neck. 5. Tighten your abdominal muscles and slowly raise your trunk (torso) high enough to lift your shoulder blades a tiny bit off of the floor. Avoid raising your torso higher than that, because it can put too much stress on your low back and it does not help to strengthen your abdominal muscles. 6. Slowly return to your starting position. Back Lifts Repeat these steps 5-10 times: 1. Lie on your abdomen (face-down) with your arms at your sides, and rest your forehead on the floor. 2. Tighten the muscles in your legs and your buttocks. 3. Slowly lift your chest off of the floor while you keep your hips pressed to the floor. Keep the back of your head in line with the curve in your back. Your eyes should be looking at the floor. 4. Hold this position for  3-5 seconds. 5. Slowly return to your starting position. SEEK MEDICAL CARE IF:  Your back pain or discomfort gets much worse when you do an exercise.  Your back pain or discomfort does not lessen within 2 hours after you exercise. If you have any of these problems, stop doing these exercises right away. Do not do them again unless your health care provider says that you can. SEEK IMMEDIATE MEDICAL CARE IF:  You develop sudden, severe back pain. If this happens, stop doing the exercises right away. Do not do them again unless your health care provider says that you can.   This information is not intended to replace advice given to you by your health care provider. Make sure you discuss any questions you have with your health care provider.   Document Released: 09/07/2004 Document Revised: 04/21/2015 Document Reviewed: 09/24/2014 Elsevier Interactive Patient Education 2016 Elsevier Inc. Back Pain, Adult Back pain is very common in adults.The cause of back pain is rarely dangerous and the pain often gets better over time.The cause of your back pain may not be known. Some common causes of back pain include: 13. Strain of the muscles or ligaments supporting the spine. 14. Wear and tear (degeneration) of the spinal disks. 15. Arthritis. 16. Direct injury to the back. For many people, back pain may return. Since back pain is rarely dangerous, most people can learn to manage this condition on their own. HOME CARE INSTRUCTIONS Watch your back pain for any changes. The following actions may help to lessen any discomfort you are feeling: 8. Remain active. It is stressful on your back to sit or stand in one place for long periods of time. Do not sit, drive, or stand in one place for more than 30 minutes at a time. Take short walks on even surfaces as soon as you are able.Try to increase the length of time you walk each day. 9. Exercise regularly as directed by your health care provider. Exercise  helps your back heal faster. It also helps avoid future injury by keeping your muscles strong and flexible. 10. Do not stay in bed.Resting more than 1-2 days can delay your recovery. 11. Pay attention to your body when you bend and lift. The most comfortable positions are those that put less stress on your recovering back. Always use proper lifting techniques, including: 1. Bending your knees. 2. Keeping the load close to your body. 3. Avoiding twisting. 12. Find a comfortable position  to sleep. Use a firm mattress and lie on your side with your knees slightly bent. If you lie on your back, put a pillow under your knees. 13. Avoid feeling anxious or stressed.Stress increases muscle tension and can worsen back pain.It is important to recognize when you are anxious or stressed and learn ways to manage it, such as with exercise. 14. Take medicines only as directed by your health care provider. Over-the-counter medicines to reduce pain and inflammation are often the most helpful.Your health care provider may prescribe muscle relaxant drugs.These medicines help dull your pain so you can more quickly return to your normal activities and healthy exercise. 15. Apply ice to the injured area: 1. Put ice in a plastic bag. 2. Place a towel between your skin and the bag. 3. Leave the ice on for 20 minutes, 2-3 times a day for the first 2-3 days. After that, ice and heat may be alternated to reduce pain and spasms. 16. Maintain a healthy weight. Excess weight puts extra stress on your back and makes it difficult to maintain good posture. SEEK MEDICAL CARE IF: 10. You have pain that is not relieved with rest or medicine. 11. You have increasing pain going down into the legs or buttocks. 12. You have pain that does not improve in one week. 13. You have night pain. 14. You lose weight. 15. You have a fever or chills. SEEK IMMEDIATE MEDICAL CARE IF:  9. You develop new bowel or bladder control  problems. 10. You have unusual weakness or numbness in your arms or legs. 11. You develop nausea or vomiting. 12. You develop abdominal pain. 13. You feel faint.   This information is not intended to replace advice given to you by your health care provider. Make sure you discuss any questions you have with your health care provider.   Document Released: 07/31/2005 Document Revised: 08/21/2014 Document Reviewed: 12/02/2013 Elsevier Interactive Patient Education [employer].

## 2015-06-08 NOTE — ED Notes (Signed)
Chronic back pain.  Patient states she had been going to [facility] and had went through PT for her back pain.   Patient states she finished up therapy in June/July and has just started having pain again.   Low back pain with history of bulging disc per patient report.

## 2015-06-08 NOTE — ED Provider Notes (Signed)
CSN: [id number]     Arrival date & time 06/08/15  0730 History   First MD Initiated Contact with Patient 06/08/15 [phone]     Chief Complaint  Patient presents with  . Back Pain   Yesenia Wheeler is a 44 y.o. 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 Patient reports she has chronic low back pain which has worsened since yesterday. She reports he's been seen in orthopedic surgeon and he going to physical therapy which ended several months ago. She reports she had return of her low back pain yesterday. She currently complains of 10 out of 10 bilateral low back pain which is worse with movement. She reports taking prednisone yesterday but nothing for treatment today. Patient also reports frequency and urgency to urinate for the past week but denies other urinary symptoms. She denies any new injury, falls, or trauma to her back. She denies fevers, chills, history cancer, history of IV drug use, loss of bladder control, loss of bowel control, difficulty urinating, dysuria, hematuria, falls, trauma, leg pain, numbness, tingling, weakness, abdominal pain, nausea, vomiting or diarrhea.  (Consider location/radiation/quality/duration/timing/severity/associated sxs/prior Treatment) HPI  Past Medical History  Diagnosis Date  . Asthma   . Migraines   . Bone spur 2008  . Anemia   . 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 Shoulder surgery Left 2008    Bone Spur removal  . Cholecystectomy    . Gastric bypass    . Knee surgery    . Cesarean section     Family History  Problem Relation Age of Onset  . Cancer Father     oral   Social History  Substance Use Topics  . Smoking status: Former Research scientist (life sciences)  . Smokeless tobacco:  Never Used  . Alcohol Use: Yes     Comment: less than once a week-usually only holidays/special occasions   OB History    Gravida Para Term Preterm AB TAB SAB Ectopic Multiple Living   13    13  13   3      Review of Systems  Constitutional: Negative for fever and chills.  HENT: Negative for congestion and sore throat.   Eyes: Negative for visual disturbance.  Respiratory: Negative for cough, shortness of breath and wheezing.   Cardiovascular: Negative for chest pain and palpitations.  Gastrointestinal: Negative for nausea, vomiting, abdominal pain and diarrhea.  Genitourinary: Positive for frequency. Negative for dysuria, urgency, hematuria, flank pain, decreased urine volume, vaginal bleeding, vaginal discharge and difficulty urinating.  Musculoskeletal: Positive for back pain. Negative for gait problem, neck pain and neck stiffness.  Skin: Negative for rash.  Neurological: Negative for syncope, weakness, light-headedness, numbness and headaches.      Allergies  Review of patient's allergies indicates no known allergies.  Home Medications   Prior to Admission medications   Medication Sig Start Date End Date Taking? Authorizing Provider  acetaminophen (TYLENOL) 500 MG tablet Take 1 tablet (500 mg total) by mouth every 6 (six) hours as needed. 06/08/15   Waynetta Pean, PA-C  albuterol (PROVENTIL HFA;VENTOLIN HFA) 108 (90 BASE) MCG/ACT inhaler Inhale 2 puffs into the lungs every 6 (six) hours as needed for wheezing. 06/10/14   Thao P Le, DO  albuterol (  PROVENTIL) (2.5 MG/3ML) 0.083% nebulizer solution Take 3 mLs (2.5 mg total) by nebulization every 4 (four) hours as needed for wheezing or shortness of breath. 06/10/14   Thao P Le, DO  baclofen (LIORESAL) 10 MG tablet Take 10 mg by mouth daily as needed for muscle spasms. Avoid daily use, limit to 1-2 days per week 12/21/14   Historical Provider, MD  cephALEXin (KEFLEX) 500 MG capsule Take 1 capsule (500 mg total) by mouth 4 (four)  times daily. 06/08/15   Waynetta Pean, PA-C  methocarbamol (ROBAXIN) 500 MG tablet Take 1 tablet (500 mg total) by mouth 2 (two) times daily as needed for muscle spasms. 06/08/15   Waynetta Pean, PA-C  rivaroxaban (XARELTO) 20 MG TABS tablet Take 1 tablet (20 mg total) by mouth daily with supper. 05/25/15   Tresa Garter, MD  warfarin (COUMADIN) 5 MG tablet Take 1 tablet (5 mg total) by mouth daily at 6 PM. 05/25/15   Tresa Garter, MD  zonisamide (ZONEGRAN) 25 MG capsule Take 100 mg by mouth at bedtime. Increase by 25 mg weekly as directed 12/21/14   Historical Provider, MD   BP 145/92 mmHg  Pulse 70  Temp(Src) 98 F (36.7 C) (Oral)  Resp 18  SpO2 98% Physical Exam  Constitutional: She appears well-developed and well-nourished. No distress.  Nontoxic appearing.  HENT:  Head: Normocephalic and atraumatic.  Mouth/Throat: Oropharynx is clear and moist.  Eyes: Conjunctivae are normal. Pupils are equal, round, and reactive to light. Right eye exhibits no discharge. Left eye exhibits no discharge.  Neck: Neck supple.  Cardiovascular: Normal rate, regular rhythm, normal heart sounds and intact distal pulses.   Pulmonary/Chest: Effort normal and breath sounds normal. No respiratory distress. She has no wheezes. She has no rales.  Abdominal: Soft. Bowel sounds are normal. There is no tenderness. There is no guarding.  Abdomen is soft and nontender to palpation. No CVA or flank tenderness.  Musculoskeletal: Normal range of motion. She exhibits tenderness. She exhibits no edema.  No midline neck or back tenderness. Tenderness of the patients bilateral low back. No back edema, deformity, ecchymosis, erythema or edema. The patient is able to ambulate without difficulty or assistance. 5 out of 5 strength in her bilateral lower extremities.  Lymphadenopathy:    She has no cervical adenopathy.  Neurological: She is alert. She has normal reflexes. She displays normal reflexes. Coordination  normal.  Sensation is intact her bilateral lower extremities. Bilateral patellar DTRs are intact. The patient is able to ambulate with normal gait.  Skin: Skin is warm and dry. No rash noted. She is not diaphoretic. No erythema. No pallor.  Psychiatric: She has a normal mood and affect. Her behavior is normal.  Nursing note and vitals reviewed.   ED Course  Procedures (including critical care time) Labs Review Labs Reviewed  URINALYSIS, ROUTINE W REFLEX MICROSCOPIC (NOT AT [facility]) - Abnormal; Notable for the following:    APPearance CLOUDY (*)    Hgb urine dipstick TRACE (*)    Nitrite POSITIVE (*)    Leukocytes, UA SMALL (*)    All other components within normal limits  URINE MICROSCOPIC-ADD ON - Abnormal; Notable for the following:    Squamous Epithelial / LPF FEW (*)    Bacteria, UA MANY (*)    All other components within normal limits  URINE CULTURE  PREGNANCY, URINE    Imaging Review No results found. I have personally reviewed and evaluated these lab results as part of my medical  decision-making.   EKG Interpretation None     Filed Vitals:   06/08/15 0742  BP: 145/92  Pulse: 70  Temp: 98 F (36.7 C)  TempSrc: Oral  Resp: 18  SpO2: 98%    MDM   Meds given in ED:  Medications  oxyCODONE-acetaminophen (PERCOCET/ROXICET) 5-325 MG per tablet 1 tablet (1 tablet Oral Given 06/08/15 0842)    New Prescriptions   ACETAMINOPHEN (TYLENOL) 500 MG TABLET    Take 1 tablet (500 mg total) by mouth every 6 (six) hours as needed.   CEPHALEXIN (KEFLEX) 500 MG CAPSULE    Take 1 capsule (500 mg total) by mouth 4 (four) times daily.   METHOCARBAMOL (ROBAXIN) 500 MG TABLET    Take 1 tablet (500 mg total) by mouth 2 (two) times daily as needed for muscle spasms.    Final diagnoses:  UTI (lower urinary tract infection)  Bilateral low back pain, with sciatica presence unspecified   This is a 44 y.o. 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 Patient reports she has chronic low back pain which has worsened since yesterday. She reports he's been seen in orthopedic surgeon and he going to physical therapy which ended several months ago. She reports she had return of her low back pain yesterday. She currently complains of 10 out of 10 bilateral low back pain which is worse with movement. She reports taking prednisone yesterday but nothing for treatment today. Patient also reports frequency and urgency to urinate for the past week but denies other urinary symptoms. She denies any trauma or injury to her back. She denies any heavy lifting recently. She reports her back pain is worse with movement. On exam the patient is afebrile nontoxic appearing. She has no CVA or flank tenderness however she does have tenderness to her bilateral low back. She has no focal neurological deficits. She is able to ambulate with normal gait. Will check urinalysis due to urinary frequency and urgency for the past week. Urinalysis indicates nitrite positive UTI with small leukocytes and many bacteria. Urine pregnancy is negative. Patient has urinary tract infection and will go ahead and treat as if this is pyelonephritis with Keflex 500 mg 4 times a day, despite the patient not having CVA or flank tenderness. That infection may be contributing to the patient's back pain. As the patient is on Xarelto will discharge with prescriptions for pain medicine for Tylenol and Robaxin. I encouraged close follow-up with her primary care physician and strict return precautions. I advised the patient to follow-up with their primary care provider this week. I advised the patient to return to the emergency department with new or worsening symptoms or new concerns. The patient verbalized understanding and agreement with plan.     Waynetta Pean, PA-C 06/08/15 9038  Quintella Reichert, MD 06/08/15 [phone]

## 2015-06-09 ENCOUNTER — Encounter (HOSPITAL_COMMUNITY): Payer: Self-pay | Admitting: Emergency Medicine

## 2015-06-09 ENCOUNTER — Telehealth: Payer: Self-pay | Admitting: Pharmacist

## 2015-06-09 ENCOUNTER — Emergency Department (HOSPITAL_COMMUNITY)
Admission: EM | Admit: 2015-06-09 | Discharge: 2015-06-09 | Discharge status: Against Medical Advice | Payer: Commercial Managed Care - PPO | Attending: Emergency Medicine | Admitting: Emergency Medicine

## 2015-06-09 DIAGNOSIS — R109 Unspecified abdominal pain: Secondary | ICD-10-CM | POA: Diagnosis not present

## 2015-06-09 DIAGNOSIS — J45909 Unspecified asthma, uncomplicated: Secondary | ICD-10-CM | POA: Insufficient documentation

## 2015-06-09 NOTE — Telephone Encounter (Signed)
Contacted patient to see if she was able to start the [place].  I was unable to reach her and I was unable to leave a message. Will attempt to reach out to her again next week.   Of note, patient was in the ED yesterday with UTI and it was reported that she was on the Xarelto but I wanted to confirm with patient still so that I would be able to discontinue the warfarin.    Nicoletta Ba, PharmD, BCPS, [facility] [phone]

## 2015-06-09 NOTE — ED Notes (Signed)
Pt seen here yesterday for UTI with flank and back pain; pt sts worse today

## 2015-06-10 ENCOUNTER — Emergency Department (HOSPITAL_COMMUNITY)
Admission: EM | Admit: 2015-06-10 | Discharge: 2015-06-10 | Disposition: A | Discharge status: Home / Self Care | Payer: Commercial Managed Care - PPO | Attending: Emergency Medicine | Admitting: Emergency Medicine

## 2015-06-10 ENCOUNTER — Encounter (HOSPITAL_COMMUNITY): Payer: Self-pay | Admitting: Emergency Medicine

## 2015-06-10 DIAGNOSIS — Z87891 Personal history of nicotine dependence: Secondary | ICD-10-CM | POA: Diagnosis not present

## 2015-06-10 DIAGNOSIS — Z792 Long term (current) use of antibiotics: Secondary | ICD-10-CM | POA: Diagnosis not present

## 2015-06-10 DIAGNOSIS — Z87440 Personal history of urinary (tract) infections: Secondary | ICD-10-CM | POA: Insufficient documentation

## 2015-06-10 DIAGNOSIS — J45909 Unspecified asthma, uncomplicated: Secondary | ICD-10-CM | POA: Diagnosis not present

## 2015-06-10 DIAGNOSIS — G8929 Other chronic pain: Secondary | ICD-10-CM | POA: Insufficient documentation

## 2015-06-10 DIAGNOSIS — G43909 Migraine, unspecified, not intractable, without status migrainosus: Secondary | ICD-10-CM | POA: Diagnosis not present

## 2015-06-10 DIAGNOSIS — Z7901 Long term (current) use of anticoagulants: Secondary | ICD-10-CM | POA: Insufficient documentation

## 2015-06-10 DIAGNOSIS — L02214 Cutaneous abscess of groin: Secondary | ICD-10-CM | POA: Insufficient documentation

## 2015-06-10 DIAGNOSIS — M545 Low back pain, unspecified: Secondary | ICD-10-CM

## 2015-06-10 DIAGNOSIS — Z862 Personal history of diseases of the blood and blood-forming organs and certain disorders involving the immune mechanism: Secondary | ICD-10-CM | POA: Diagnosis not present

## 2015-06-10 DIAGNOSIS — A5901 Trichomonal vulvovaginitis: Secondary | ICD-10-CM | POA: Diagnosis not present

## 2015-06-10 DIAGNOSIS — Z86718 Personal history of other venous thrombosis and embolism: Secondary | ICD-10-CM | POA: Insufficient documentation

## 2015-06-10 LAB — GC/CHLAMYDIA PROBE AMP (~~LOC~~) NOT AT ARMC
CHLAMYDIA, DNA PROBE: NEGATIVE
NEISSERIA GONORRHEA: NEGATIVE

## 2015-06-10 LAB — URINE CULTURE: Culture: >=100000

## 2015-06-10 LAB — WET PREP, GENITAL
CLUE CELLS WET PREP: NONE SEEN
YEAST WET PREP: NONE SEEN

## 2015-06-10 MED ORDER — SULFAMETHOXAZOLE-TRIMETHOPRIM 800-160 MG PO TABS
1.0000 | ORAL_TABLET | Freq: Once | ORAL | Status: AC
  Administered 2015-06-10: 1 via ORAL
  Filled 2015-06-10: qty 1

## 2015-06-10 MED ORDER — METRONIDAZOLE 500 MG PO TABS: 500.0000 mg | ORAL_TABLET | Freq: Two times a day (BID) | ORAL | Status: DC

## 2015-06-10 MED ORDER — TRAMADOL HCL 50 MG PO TABS: 50.0000 mg | ORAL_TABLET | Freq: Four times a day (QID) | ORAL | Status: DC | PRN

## 2015-06-10 MED ORDER — OXYCODONE-ACETAMINOPHEN 5-325 MG PO TABS
2.0000 | ORAL_TABLET | Freq: Once | ORAL | Status: AC
  Administered 2015-06-10: 2 via ORAL
  Filled 2015-06-10: qty 2

## 2015-06-10 NOTE — ED Provider Notes (Signed)
CSN: [id number]     Arrival date & time 06/10/15  3419 History   First MD Initiated Contact with Patient 06/10/15 0747     Chief Complaint  Patient presents with  . Back Pain     (Consider location/radiation/quality/duration/timing/severity/associated sxs/prior Treatment) The history is provided by the patient and a relative. No language interpreter was used.   Yesenia Wheeler is a 44 year old female with a history of DVT and is on Xarelto, anemia, asthma, and DDD who presents to the ED complaining of worsening chronic lower back pain for the past 3 days. She states she has chronic lower back pain but this has been worse in the past few days. She states her pain is 10/10 now. Pain is worse with movement. She denies any dysuria, hematuria, or urinary frequency. She states that she urinated on herself due to pain and inability to get up to use the bathroom. She is also complaining of 2 boils, one in her groin and one on her abdomen that have been worsening for the past 2 days. She states she has odorous white vaginal discharge. She denies any injury, fall, lifting. She denies any fever, chills, abdominal pain, nausea, vomiting, bowel incontinence or retention, vaginal bleeding, IV drug use, history of cancer, leg pain, numbness, tingling, lower extremity weakness. Past Medical History  Diagnosis Date  . Asthma   . Migraines   . Bone spur 2008  . Anemia   . DVT (deep venous thrombosis) ([place]) 1995    had DVT x4, 2 DVTs in pregnancy and 2 outside of pregnancy    Past Surgical History  Procedure Laterality Date  . Shoulder surgery Left 2008    Bone Spur removal  . Cholecystectomy    . Gastric bypass    . Knee surgery    . Cesarean section     Family History  Problem Relation Age of Onset  . Cancer Father     oral   Social History  Substance Use Topics  . Smoking status: Former Research scientist (life sciences)  . Smokeless tobacco: Never Used  . Alcohol Use: Yes     Comment: less than once a  week-usually only holidays/special occasions   OB History    Gravida Para Term Preterm AB TAB SAB Ectopic Multiple Living   13    13  13   3      Review of Systems  Constitutional: Negative for fever and chills.  Gastrointestinal: Negative for abdominal pain.  Genitourinary: Negative for dysuria.  Musculoskeletal: Positive for back pain.  Skin: Negative for rash.  Neurological: Negative for weakness and numbness.  All other systems reviewed and are negative.     Allergies  Review of patient's allergies indicates no known allergies.  Home Medications   Prior to Admission medications   Medication Sig Start Date End Date Taking? Authorizing Provider  acetaminophen (TYLENOL) 500 MG tablet Take 1 tablet (500 mg total) by mouth every 6 (six) hours as needed. 06/08/15   Waynetta Pean, PA-C  albuterol (PROVENTIL HFA;VENTOLIN HFA) 108 (90 BASE) MCG/ACT inhaler Inhale 2 puffs into the lungs every 6 (six) hours as needed for wheezing. 06/10/14   Thao P Le, DO  albuterol (PROVENTIL) (2.5 MG/3ML) 0.083% nebulizer solution Take 3 mLs (2.5 mg total) by nebulization every 4 (four) hours as needed for wheezing or shortness of breath. 06/10/14   Thao P Le, DO  baclofen (LIORESAL) 10 MG tablet Take 10 mg by mouth daily as needed for muscle spasms. Avoid daily use, limit to  1-2 days per week 12/21/14   Historical Provider, MD  cephALEXin (KEFLEX) 500 MG capsule Take 1 capsule (500 mg total) by mouth 4 (four) times daily. 06/08/15   Waynetta Pean, PA-C  methocarbamol (ROBAXIN) 500 MG tablet Take 1 tablet (500 mg total) by mouth 2 (two) times daily as needed for muscle spasms. 06/08/15   Waynetta Pean, PA-C  metroNIDAZOLE (FLAGYL) 500 MG tablet Take 1 tablet (500 mg total) by mouth 2 (two) times daily. 06/10/15   Glorie Dowlen Patel-Mills, PA-C  rivaroxaban (XARELTO) 20 MG TABS tablet Take 1 tablet (20 mg total) by mouth daily with supper. 05/25/15   Tresa Garter, MD  traMADol (ULTRAM) 50 MG tablet Take 1  tablet (50 mg total) by mouth every 6 (six) hours as needed. 06/10/15   Kloey Cazarez Patel-Mills, PA-C  warfarin (COUMADIN) 5 MG tablet Take 1 tablet (5 mg total) by mouth daily at 6 PM. 05/25/15   Tresa Garter, MD  zonisamide (ZONEGRAN) 25 MG capsule Take 100 mg by mouth at bedtime. Increase by 25 mg weekly as directed 12/21/14   Historical Provider, MD   BP 144/82 mmHg  Pulse 80  Temp(Src) 97.6 F (36.4 C) (Oral)  Resp 16  SpO2 100% Physical Exam  Constitutional: She is oriented to person, place, and time. She appears well-developed and well-nourished.  HENT:  Head: Normocephalic and atraumatic.  Eyes: Conjunctivae are normal.  Neck: Normal range of motion. Neck supple.  Cardiovascular: Normal rate, regular rhythm and normal heart sounds.   Pulmonary/Chest: Effort normal and breath sounds normal.  Abdominal: Soft. She exhibits no distension. There is no tenderness.  Abdomen is soft and nontender. No CVA tenderness.  Genitourinary: Pelvic exam was performed with patient supine.  Pelvic exam: Chaperone present. Copious amounts of cottage cheeselike malodorous vaginal discharge. No vaginal bleeding. Cervical os is closed. No adnexal tenderness.  Musculoskeletal: Normal range of motion.  No midline lumbar vertebral tenderness. No rashes noted. She is able to ambulate without difficulty or assistance. 5/5 strength in lower extremities. She is able to dorsi and plantar flex without difficulty. Negative straight leg raise bilaterally. NVI. No foot drop. No saddle anesthesia.  Neurological: She is alert and oriented to person, place, and time.  Skin: Skin is warm and dry.  1 indurated small quarter size abscess along the crease of her abdomen but without surrounding erythema, warmth, or drainage.  1 right sided groin indurated abscess with no surrounding erythema, warmth, or drainage.  Nursing note and vitals reviewed.   ED Course  Procedures (including critical care time) Labs  Review Labs Reviewed  WET PREP, GENITAL - Abnormal; Notable for the following:    Trich, Wet Prep MANY (*)    WBC, Wet Prep HPF POC FEW (*)    All other components within normal limits  RPR  HIV ANTIBODY (ROUTINE TESTING)  GC/CHLAMYDIA PROBE AMP ([place]) NOT AT [facility]    Imaging Review No results found. I have personally reviewed and evaluated these lab results as part of my medical decision-making.   EKG Interpretation None      MDM   Final diagnoses:  Trichomonas vaginitis  Bilateral low back pain without sciatica  Patient presents for chronic back pain but worse the last 3 days. She was recently seen 2 days ago for the same and had a UTI at that time and was prescribed Keflex. She states she has been taking Keflex regularly. She has no urinary symptoms at this time but is complaining of odorous  vaginal discharge. Wet prep shows that she has Trichomonas. I discussed this with the patient as well as having her sexual partners treated. She was prescribed metronidazole which I discussed she should not take with alcohol. STD panel was done and results will not be back for several days which I explained to the patient that she would be notified by the hospital if they were positive. She also has 2 indurated abscesses along the abdomen and groin. I have added Bactrim to cover for MRSA since she is on her knee on Keflex but do not believe that this needs incision and drainage at this time. She had an x-ray of her lumbar spine on 12/12/2014 which showed degenerative changes at L5-S1. She has no red flag symptoms as noted in her physical exam. She stated that she was incontinent on herself but states that she could not get up and time due to pain. Her kidney function 2 months ago was normal. She has no red flag symptoms and is R Reddy on Robaxin. She states she was not given any pain medicine the last time she was seen for back pain 2 days ago. Rx: Tramadol, metronidazole       Yesenia Glazier, PA-C 06/10/15 [place], MD 06/11/15 1302

## 2015-06-10 NOTE — ED Notes (Signed)
Patient states still having low back pain.  Patient states it has worsened and it's hard for her to straighten up so she can walk.  Patient states she "may have gotten two boils to coming up since then too".  Patient states boils on lower abdomen.   Patient states "robaxin doesn't touch it for me.  I need something stronger".

## 2015-06-10 NOTE — Discharge Instructions (Signed)
Trichomoniasis Inform your partners that they should also be treated. Take medication as prescribed and do not drink alcohol while using this medication. Follow-up with a primary care provider using the resource guide below. Trichomoniasis is an infection caused by an organism called Trichomonas. The infection can affect both women and men. In women, the outer female genitalia and the vagina are affected. In men, the penis is mainly affected, but the prostate and other reproductive organs can also be involved. Trichomoniasis is a sexually transmitted infection (STI) and is most often passed to another person through sexual contact.  RISK FACTORS  Having unprotected sexual intercourse.  Having sexual intercourse with an infected partner. SIGNS AND SYMPTOMS  Symptoms of trichomoniasis in women include:  Abnormal gray-green frothy vaginal discharge.  Itching and irritation of the vagina.  Itching and irritation of the area outside the vagina. Symptoms of trichomoniasis in men include:   Penile discharge with or without pain.  Pain during urination. This results from inflammation of the urethra. DIAGNOSIS  Trichomoniasis may be found during a Pap test or physical exam. Your health care provider may use one of the following methods to help diagnose this infection:  Testing the pH of the vagina with a test tape.  Using a vaginal swab test that checks for the Trichomonas organism. A test is available that provides results within a few minutes.  Examining a urine sample.  Testing vaginal secretions. Your health care provider may test you for other STIs, including HIV. TREATMENT   You may be given medicine to fight the infection. Women should inform their health care provider if they could be or are pregnant. Some medicines used to treat the infection should not be taken during pregnancy.  Your health care provider may recommend over-the-counter medicines or creams to decrease itching or  irritation.  Your sexual partner will need to be treated if infected.  Your health care provider may test you for infection again 3 months after treatment. HOME CARE INSTRUCTIONS   Take medicines only as directed by your health care provider.  Take over-the-counter medicine for itching or irritation as directed by your health care provider.  Do not have sexual intercourse while you have the infection.  Women should not douche or wear tampons while they have the infection.  Discuss your infection with your partner. Your partner may have gotten the infection from you, or you may have gotten it from your partner.  Have your sex partner get examined and treated if necessary.  Practice safe, informed, and protected sex.  See your health care provider for other STI testing. SEEK MEDICAL CARE IF:   You still have symptoms after you finish your medicine.  You develop abdominal pain.  You have pain when you urinate.  You have bleeding after sexual intercourse.  You develop a rash.  Your medicine makes you sick or makes you throw up (vomit). MAKE SURE YOU:  Understand these instructions.  Will watch your condition.  Will get help right away if you are not doing well or get worse.   This information is not intended to replace advice given to you by your health care provider. Make sure you discuss any questions you have with your health care provider.   Document Released: 01/24/2001 Document Revised: 08/21/2014 Document Reviewed: 05/12/2013 Elsevier Interactive Patient Education 2016 Elsevier Inc.  Back Pain, Adult Back pain is very common in adults.The cause of back pain is rarely dangerous and the pain often gets better over time.The cause  of your back pain may not be known. Some common causes of back pain include:  Strain of the muscles or ligaments supporting the spine.  Wear and tear (degeneration) of the spinal disks.  Arthritis.  Direct injury to the back. For  many people, back pain may return. Since back pain is rarely dangerous, most people can learn to manage this condition on their own. HOME CARE INSTRUCTIONS Watch your back pain for any changes. The following actions may help to lessen any discomfort you are feeling:  Remain active. It is stressful on your back to sit or stand in one place for long periods of time. Do not sit, drive, or stand in one place for more than 30 minutes at a time. Take short walks on even surfaces as soon as you are able.Try to increase the length of time you walk each day.  Exercise regularly as directed by your health care provider. Exercise helps your back heal faster. It also helps avoid future injury by keeping your muscles strong and flexible.  Do not stay in bed.Resting more than 1-2 days can delay your recovery.  Pay attention to your body when you bend and lift. The most comfortable positions are those that put less stress on your recovering back. Always use proper lifting techniques, including:  Bending your knees.  Keeping the load close to your body.  Avoiding twisting.  Find a comfortable position to sleep. Use a firm mattress and lie on your side with your knees slightly bent. If you lie on your back, put a pillow under your knees.  Avoid feeling anxious or stressed.Stress increases muscle tension and can worsen back pain.It is important to recognize when you are anxious or stressed and learn ways to manage it, such as with exercise.  Take medicines only as directed by your health care provider. Over-the-counter medicines to reduce pain and inflammation are often the most helpful.Your health care provider may prescribe muscle relaxant drugs.These medicines help dull your pain so you can more quickly return to your normal activities and healthy exercise.  Apply ice to the injured area:  Put ice in a plastic bag.  Place a towel between your skin and the bag.  Leave the ice on for 20 minutes,  2-3 times a day for the first 2-3 days. After that, ice and heat may be alternated to reduce pain and spasms.  Maintain a healthy weight. Excess weight puts extra stress on your back and makes it difficult to maintain good posture. SEEK MEDICAL CARE IF:  You have pain that is not relieved with rest or medicine.  You have increasing pain going down into the legs or buttocks.  You have pain that does not improve in one week.  You have night pain.  You lose weight.  You have a fever or chills. SEEK IMMEDIATE MEDICAL CARE IF:   You develop new bowel or bladder control problems.  You have unusual weakness or numbness in your arms or legs.  You develop nausea or vomiting.  You develop abdominal pain.  You feel faint.   This information is not intended to replace advice given to you by your health care provider. Make sure you discuss any questions you have with your health care provider.   Document Released: 07/31/2005 Document Revised: 08/21/2014 Document Reviewed: 12/02/2013 Elsevier Interactive Patient Education 2016 [employer].  Emergency Department Resource Guide 1) Find a Doctor and Pay Out of Pocket Although you won't have to find out who is covered  by your insurance plan, it is a good idea to ask around and get recommendations. You will then need to call the office and see if the doctor you have chosen will accept you as a new patient and what types of options they offer for patients who are self-pay. Some doctors offer discounts or will set up payment plans for their patients who do not have insurance, but you will need to ask so you aren't surprised when you get to your appointment.  2) Contact Your Local Health Department Not all health departments have doctors that can see patients for sick visits, but many do, so it is worth a call to see if yours does. If you don't know where your local health department is, you can check in your phone book. The CDC also has a tool to  help you locate your state's health department, and many state websites also have listings of all of their local health departments.  3) Find a [facility] If your illness is not likely to be very severe or complicated, you may want to try a walk in clinic. These are popping up all over the country in pharmacies, drugstores, and shopping centers. They're usually staffed by nurse practitioners or physician assistants that have been trained to treat common illnesses and complaints. They're usually fairly quick and inexpensive. However, if you have serious medical issues or chronic medical problems, these are probably not your best option.  No Primary Care Doctor: - Call Health Connect at  [phone] - they can help you locate a primary care doctor that  accepts your insurance, provides certain services, etc. - Physician Referral Service- [phone]  Chronic Pain Problems: Organization         Address  Phone   Notes  [facility]  [phone] Patients need to be referred by their primary care doctor.   Medication Assistance: Organization         Address  Phone   Notes  [facility] Medication [facility] [address] [place], [place] [address] [phone] --Must be a resident of [facility] -- Must have NO insurance coverage whatsoever (no Medicaid/ Medicare, etc.) -- The pt. MUST have a primary care doctor that directs their care regularly and follows them in the community   [facility]ist  [phone]   [employer]  [phone]    Agencies that provide inexpensive medical care: Organization         Address  Phone   Notes  [place]  [phone]   [facility]    [phone]   [facility] [place], [place] [address] [phone]   [place] [address], [place] [phone]   Planned Parenthood    [phone]   [facility]    [phone]   [place] and [place] [address], [place] Phone:  [phone], Fax:  [phone] Hours of Operation:  9 am - 6 pm, M-F.  Also accepts Medicaid/Medicare and self-pay.  [facility] for [place], [address], [place] Phone: [phone], Fax: [phone]. Hours of Operation:  8:30 am - 5:30 pm, M-F.  Also accepts Medicaid and self-pay.  [facility] [place] [address] [employer] Phone: [phone]   [place], [place], [place] [phone], Ext. [phone]  Mondays & Thursdays: 7-9 AM.  First 15 patients are seen on a first come, first serve basis.    [place] Providers:  Organization         Address  Phone   Notes  [facility] [address], [place] [phone] Also accepts self-pay patients.  [facility] [address], [place]  [phone]   [place], [address], [place] [phone]   [facility] [address] [place] [phone]   Lucianne Lei [address], [place]   [phone] Only accepts [place] Access [place] patients after they have their name applied to their card.   Self-Pay (no insurance) in [facility]:  Organization         Address  Phone   Notes  Sickle Cell Patients, [facility] [place] [phone]   [facility] Urgent Care [place] [phone]   [facility] [place]  [place], [place], [employer] [phone]   [facility]/Dr. Osei-Bonsu  [address], [place] or [address], [place] [phone] Phone number for both [place] and [place] locations is the same.  Urgent Medical and [facility] [address]  [place] [phone]   [facility] [address], [place] or [address] [phone]   [facility] [address] [place] [phone], phone; [phone], fax Sees patients 1st and 3rd Saturday of every month.  Must not qualify for public or private insurance (i.e. Medicaid, Medicare, [place] Health Choice, Veterans' Benefits)  Household income should be no more than 200% of the poverty level The clinic cannot treat you if you are pregnant or think you are pregnant  Sexually transmitted diseases are not treated at the clinic.    Dental Care: Organization         Address  Phone  Notes  [facility] Department of [facility] [place] [phone] Accepts children up to age 58 who are enrolled in [place] or [place]; pregnant women with a Medicaid card; and children who have applied for Medicaid or [place] Health Choice, but were declined, whose parents can pay a reduced fee at time of service.  [facility] Department of [facility]  [address], [place] [phone] Accepts children up to age 32 who are enrolled in [place] or [place]; pregnant women with a Medicaid card; and children who have applied for Medicaid or [place] Health Choice, but were declined, whose parents can pay a reduced fee at time of service.  [place] Adult Dental Access PROGRAM  Rule [phone] Patients are seen by appointment only. Walk-ins are not accepted. [place] will see patients 29 years of age and older. Monday - Tuesday (8am-5pm) Most Wednesdays (8:30-5pm) $30 per visit, cash only  [facility] Adult Dental Access PROGRAM  [address], [facility] [phone] Patients are seen by appointment only. Walk-ins are not accepted. [place] will see patients 79 years of age and older. One Wednesday Evening  (Monthly: Volunteer Based).  $30 per visit, cash only  [place]  [phone] for adults;  Children under age 70, call Graduate Pediatric Dentistry at [phone]. Children aged 47-14, please call [phone] to request a pediatric application.  Dental services are provided in all areas of dental care including fillings, crowns and bridges, complete and partial dentures, implants, gum treatment, root canals, and extractions. Preventive care is also provided. Treatment is provided to both adults and children. Patients are selected via a lottery and there is often a waiting list.   [facility] [address] [place]  [phone] [email or web address]   [facility] [address] [place], [place] [phone], Ext. [phone] Second and Fourth Thursday of each month, opens at 6:30 AM; Clinic ends at 9 AM.  Patients are seen on a first-come first-served basis, and a limited number are seen during each clinic.   [facility]  [address] Hillard Danker [place], [place] [phone]   Eligibility Requirements You must have lived in [place], [place], or [place] counties for at least the last three months.   You cannot be eligible for state or federal sponsored [employer], including [employer], [place], or [employer].   You generally cannot be eligible for healthcare insurance through your employer.    How to apply: Eligibility screenings are held every Tuesday and Wednesday afternoon from 1:00 pm until 4:00 pm. You do not need an appointment for the interview!  [facility] [address] [place], [place]   [place]  [place] Department  [place]  [phone]    Behavioral Health Resources in the Community: Intensive Outpatient Programs Organization         Address  Phone  Notes  [place]. [address] [place], [place] [phone]   [facility] Outpatient [address], [place], [place]   ADS: Alcohol & Drug Svcs [address], [place], [place]   [place] [address]  [place], [place] or [phone]   Substance Abuse Resources Organization         Address  Phone  Notes  Alcohol and Drug Services  [phone]   [place]  [phone]   [facility] [place]   Chinita Pester  [phone]   Residential & Outpatient Substance Abuse Program  [phone]   Psychological Services Organization         Address  Phone  Notes  [facility] [place]  [place]  [phone]   [place] [address] [place] or [phone]    Mobile Crisis Teams Organization         Address  Phone  Notes  [facility], Mobile Crisis Care Unit  [phone]   Assertive Psychotherapeutic Services  [address] [place], [place]   [place] Levels [address], [place] [phone]    Self-Help/Support Groups Organization         Address  Phone             Notes  [place]. of [place] - variety of support groups  [place] Call for more information  Narcotics Anonymous (NA), [facility] [address], [employer] [place]  2 meetings at this location   Brewing technologist  Notes  [facility]  [address]    [place]  [phone]   [facility]  [address], [place] [address], [place], [place]   [place], [place] [phone] Admissions: 8am-3pm M-F  Incentives Substance [place] [address],    [place], [place] [phone]   [facility] [address] [place],  [place], [place]   [facility] [address],  [place], [place]   [facility] Programs - Intensive Outpatient [address] Kristeen [address], [place], [place]   [facility] [place].,  [place], [place] [phone] or [phone]   [facility] Services (RTS) [address] [place], [place] Accepts Medicaid  [facility] [place] [address]  [place] [phone] Substance Abuse/Addiction Treatment   [facility] Organization         Address  Phone  Notes  [facility]  [phone]   Domenic Schwab, PhD [address] Arlis Porta [place], [place]   [phone] or [phone]   [place], [place] [phone]   [facility] [address], [place], [place] [phone] Insurance/Medicaid/sponsorship through [facility] and Families [address], [address] [place]                                    [place], [place] [phone] [place] [address], [place] [phone]    Dr. Adele Schilder  [phone]   [facility] [place] Dept. 1) [address], [place] 2) [place] 3)  [address], [place] [phone]  [phone]   [place] [phone] or [phone] (After Hours)

## 2015-06-11 ENCOUNTER — Telehealth (HOSPITAL_BASED_OUTPATIENT_CLINIC_OR_DEPARTMENT_OTHER): Payer: Self-pay | Admitting: Emergency Medicine

## 2015-06-11 LAB — RPR: RPR Ser Ql: NONREACTIVE

## 2015-06-11 LAB — HIV ANTIBODY (ROUTINE TESTING W REFLEX): HIV Screen 4th Generation wRfx: NONREACTIVE

## 2015-06-11 NOTE — Telephone Encounter (Signed)
Post ED Visit - Positive Culture Follow-up  Culture report reviewed by antimicrobial stewardship pharmacist:  []  Heide Guile, Pharm.D., BCPS []  Alycia Rossetti, Pharm.D., BCPS []  [place], [place].D., BCPS, AAHIVP []  Legrand [place], Pharm.D., BCPS, AAHIVP []  [place], Pharm.D. []  Milus Glazier, Pharm.D. Angela Burke PharmD  Positive urine  Culture Klebsiella Treated with cephalexin, organism sensitive to the same and no further patient follow-up is required at this time.  Hazle Nordmann 06/11/2015, 10:23 AM

## 2015-06-14 ENCOUNTER — Emergency Department (HOSPITAL_COMMUNITY)
Admission: EM | Admit: 2015-06-14 | Discharge: 2015-06-14 | Discharge status: Against Medical Advice | Payer: Commercial Managed Care - PPO | Attending: Emergency Medicine | Admitting: Emergency Medicine

## 2015-06-14 ENCOUNTER — Encounter (HOSPITAL_COMMUNITY): Payer: Self-pay | Admitting: Family Medicine

## 2015-06-14 ENCOUNTER — Emergency Department (HOSPITAL_COMMUNITY)
Admission: EM | Admit: 2015-06-14 | Discharge: 2015-06-14 | Disposition: A | Discharge status: Home / Self Care | Payer: Commercial Managed Care - PPO | Source: Home / Self Care | Attending: Emergency Medicine | Admitting: Emergency Medicine

## 2015-06-14 DIAGNOSIS — Z79899 Other long term (current) drug therapy: Secondary | ICD-10-CM

## 2015-06-14 DIAGNOSIS — Z86718 Personal history of other venous thrombosis and embolism: Secondary | ICD-10-CM

## 2015-06-14 DIAGNOSIS — B373 Candidiasis of vulva and vagina: Secondary | ICD-10-CM | POA: Insufficient documentation

## 2015-06-14 DIAGNOSIS — Z5321 Procedure and treatment not carried out due to patient leaving prior to being seen by health care provider: Secondary | ICD-10-CM | POA: Insufficient documentation

## 2015-06-14 DIAGNOSIS — M549 Dorsalgia, unspecified: Secondary | ICD-10-CM | POA: Insufficient documentation

## 2015-06-14 DIAGNOSIS — Z792 Long term (current) use of antibiotics: Secondary | ICD-10-CM

## 2015-06-14 DIAGNOSIS — Z8679 Personal history of other diseases of the circulatory system: Secondary | ICD-10-CM

## 2015-06-14 DIAGNOSIS — J45909 Unspecified asthma, uncomplicated: Secondary | ICD-10-CM | POA: Insufficient documentation

## 2015-06-14 DIAGNOSIS — N926 Irregular menstruation, unspecified: Secondary | ICD-10-CM | POA: Insufficient documentation

## 2015-06-14 DIAGNOSIS — Z87891 Personal history of nicotine dependence: Secondary | ICD-10-CM

## 2015-06-14 DIAGNOSIS — Z7901 Long term (current) use of anticoagulants: Secondary | ICD-10-CM

## 2015-06-14 DIAGNOSIS — Z862 Personal history of diseases of the blood and blood-forming organs and certain disorders involving the immune mechanism: Secondary | ICD-10-CM

## 2015-06-14 DIAGNOSIS — B3731 Acute candidiasis of vulva and vagina: Secondary | ICD-10-CM

## 2015-06-14 LAB — WET PREP, GENITAL
Clue Cells Wet Prep HPF POC: NONE SEEN
Trich, Wet Prep: NONE SEEN
Yeast Wet Prep HPF POC: NONE SEEN

## 2015-06-14 MED ORDER — FLUCONAZOLE 150 MG PO TABS: 150.0000 mg | ORAL_TABLET | Freq: Every day | ORAL | Status: DC

## 2015-06-14 NOTE — Discharge Instructions (Signed)
Abnormal Uterine Bleeding Abnormal uterine bleeding can affect women at various stages in life, including teenagers, women in their reproductive years, pregnant women, and women who have reached menopause. Several kinds of uterine bleeding are considered abnormal, including:  Bleeding or spotting between periods.   Bleeding after sexual intercourse.   Bleeding that is heavier or more than normal.   Periods that last longer than usual.  Bleeding after menopause.  Many cases of abnormal uterine bleeding are minor and simple to treat, while others are more serious. Any type of abnormal bleeding should be evaluated by your health care provider. Treatment will depend on the cause of the bleeding. HOME CARE INSTRUCTIONS Monitor your condition for any changes. The following actions may help to alleviate any discomfort you are experiencing:  Avoid the use of tampons and douches as directed by your health care provider.  Change your pads frequently. You should get regular pelvic exams and Pap tests. Keep all follow-up appointments for diagnostic tests as directed by your health care provider.  SEEK MEDICAL CARE IF:   Your bleeding lasts more than 1 week.   You feel dizzy at times.  SEEK IMMEDIATE MEDICAL CARE IF:   You pass out.   You are changing pads every 15 to 30 minutes.   You have abdominal pain.  You have a fever.   You become sweaty or weak.   You are passing large blood clots from the vagina.   You start to feel nauseous and vomit. MAKE SURE YOU:   Understand these instructions.  Will watch your condition.  Will get help right away if you are not doing well or get worse.   This information is not intended to replace advice given to you by your health care provider. Make sure you discuss any questions you have with your health care provider.   Document Released: 07/31/2005 Document Revised: 08/05/2013 Document Reviewed: 02/27/2013 Elsevier Interactive  Patient Education 2016 Elsevier Inc. Monilial Vaginitis Vaginitis in a soreness, swelling and redness (inflammation) of the vagina and vulva. Monilial vaginitis is not a sexually transmitted infection. CAUSES  Yeast vaginitis is caused by yeast (candida) that is normally found in your vagina. With a yeast infection, the candida has overgrown in number to a point that upsets the chemical balance. SYMPTOMS   White, thick vaginal discharge.  Swelling, itching, redness and irritation of the vagina and possibly the lips of the vagina (vulva).  Burning or painful urination.  Painful intercourse. DIAGNOSIS  Things that may contribute to monilial vaginitis are:  Postmenopausal and virginal states.  Pregnancy.  Infections.  Being tired, sick or stressed, especially if you had monilial vaginitis in the past.  Diabetes. Good control will help lower the chance.  Birth control pills.  Tight fitting garments.  Using bubble bath, feminine sprays, douches or deodorant tampons.  Taking certain medications that kill germs (antibiotics).  Sporadic recurrence can occur if you become ill. TREATMENT  Your caregiver will give you medication.  There are several kinds of anti monilial vaginal creams and suppositories specific for monilial vaginitis. For recurrent yeast infections, use a suppository or cream in the vagina 2 times a week, or as directed.  Anti-monilial or steroid cream for the itching or irritation of the vulva may also be used. Get your caregiver's permission.  Painting the vagina with methylene blue solution may help if the monilial cream does not work.  Eating yogurt may help prevent monilial vaginitis. HOME CARE INSTRUCTIONS   Finish all medication as  prescribed.  Do not have sex until treatment is completed or after your caregiver tells you it is okay.  Take warm sitz baths.  Do not douche.  Do not use tampons, especially scented ones.  Wear cotton  underwear.  Avoid tight pants and panty hose.  Tell your sexual partner that you have a yeast infection. They should go to their caregiver if they have symptoms such as mild rash or itching.  Your sexual partner should be treated as well if your infection is difficult to eliminate.  Practice safer sex. Use condoms.  Some vaginal medications cause latex condoms to fail. Vaginal medications that harm condoms are:  Cleocin cream.  Butoconazole (Femstat).  Terconazole (Terazol) vaginal suppository.  Miconazole (Monistat) (may be purchased over the counter). SEEK MEDICAL CARE IF:   You have a temperature by mouth above 102 F (38.9 C).  The infection is getting worse after 2 days of treatment.  The infection is not getting better after 3 days of treatment.  You develop blisters in or around your vagina.  You develop vaginal bleeding, and it is not your menstrual period.  You have pain when you urinate.  You develop intestinal problems.  You have pain with sexual intercourse.   This information is not intended to replace advice given to you by your health care provider. Make sure you discuss any questions you have with your health care provider.   Document Released: 05/10/2005 Document Revised: 10/23/2011 Document Reviewed: 02/01/2015 Elsevier Interactive Patient Education [employer].

## 2015-06-14 NOTE — ED Notes (Signed)
The pt checked in for 2 minutes and decided that she was leaving to [facility]/   Wait time was 3 minutes when she left

## 2015-06-14 NOTE — ED Provider Notes (Signed)
CSN: [id number]     Arrival date & time 06/14/15  0111 History   First MD Initiated Contact with Patient 06/14/15 0319     Chief Complaint  Patient presents with  . Vaginitis  . Vaginal Bleeding     (Consider location/radiation/quality/duration/timing/severity/associated sxs/prior Treatment) Patient is a 44 y.o. female presenting with vaginal bleeding. The history is provided by the patient. No language interpreter was used.  Vaginal Bleeding Quality: Reports she passed 2 clots earlier this evening. Severity:  Mild Associated symptoms: back pain and vaginal discharge   Associated symptoms: no dysuria and no fever   Associated symptoms comment:  Patient returns to the ED with complaint of vaginal itching and discharge like previous yeast infections. She also having lower abdominal pain, persistent back pain, and started having vaginal bleeding. She reports she is 2 weeks late for her regular menses. No fever. She has had nausea without vomiting. No urinary symptoms. Recently treated for UTI and STD at the emergency department. Lower abdominal discomfort is new, prompting return visit.    Past Medical History  Diagnosis Date  . Asthma   . Migraines   . Bone spur 2008  . Anemia   . DVT (deep venous thrombosis) ([place]) 1995    had DVT x4, 2 DVTs in pregnancy and 2 outside of pregnancy    Past Surgical History  Procedure Laterality Date  . Shoulder surgery Left 2008    Bone Spur removal  . Cholecystectomy    . Gastric bypass    . Knee surgery    . Cesarean section     Family History  Problem Relation Age of Onset  . Cancer Father     oral   Social History  Substance Use Topics  . Smoking status: Former Research scientist (life sciences)  . Smokeless tobacco: Never Used  . Alcohol Use: Yes     Comment: less than once a week-usually only holidays/special occasions   OB History    Gravida Para Term Preterm AB TAB SAB Ectopic Multiple Living   13    13  13   3      Review of Systems  Constitutional:  Negative for fever and chills.  Respiratory: Negative.   Cardiovascular: Negative.   Gastrointestinal: Negative.   Genitourinary: Positive for vaginal bleeding, vaginal discharge and menstrual problem. Negative for dysuria.  Musculoskeletal: Positive for back pain.  Skin: Negative.   Neurological: Negative.       Allergies  Review of patient's allergies indicates no known allergies.  Home Medications   Prior to Admission medications   Medication Sig Start Date End Date Taking? Authorizing Provider  cephALEXin (KEFLEX) 500 MG capsule Take 1 capsule (500 mg total) by mouth 4 (four) times daily. 06/08/15  Yes Waynetta Pean, PA-C  metroNIDAZOLE (FLAGYL) 500 MG tablet Take 1 tablet (500 mg total) by mouth 2 (two) times daily. 06/10/15  Yes Hanna Patel-Mills, PA-C  traMADol (ULTRAM) 50 MG tablet Take 1 tablet (50 mg total) by mouth every 6 (six) hours as needed. 06/10/15  Yes Hanna Patel-Mills, PA-C  warfarin (COUMADIN) 5 MG tablet Take 1 tablet (5 mg total) by mouth daily at 6 PM. 05/25/15  Yes Olugbemiga E Doreene Burke, MD  acetaminophen (TYLENOL) 500 MG tablet Take 1 tablet (500 mg total) by mouth every 6 (six) hours as needed. Patient not taking: Reported on 06/14/2015 06/08/15   Waynetta Pean, PA-C  albuterol (PROVENTIL HFA;VENTOLIN HFA) 108 (90 BASE) MCG/ACT inhaler Inhale 2 puffs into the lungs every 6 (six) hours as  needed for wheezing. Patient not taking: Reported on 06/14/2015 06/10/14   Thao P Le, DO  albuterol (PROVENTIL) (2.5 MG/3ML) 0.083% nebulizer solution Take 3 mLs (2.5 mg total) by nebulization every 4 (four) hours as needed for wheezing or shortness of breath. Patient not taking: Reported on 06/14/2015 06/10/14   Thao P Le, DO  methocarbamol (ROBAXIN) 500 MG tablet Take 1 tablet (500 mg total) by mouth 2 (two) times daily as needed for muscle spasms. Patient not taking: Reported on 06/14/2015 06/08/15   Waynetta Pean, PA-C  rivaroxaban (XARELTO) 20 MG TABS tablet Take 1  tablet (20 mg total) by mouth daily with supper. Patient not taking: Reported on 06/14/2015 05/25/15   Tresa Garter, MD   BP 115/72 mmHg  Pulse 59  Temp(Src) 98.3 F (36.8 C) (Oral)  Resp 16  Ht 5\' 4"  (1.626 m)  Wt 215 lb (97.523 kg)  BMI 36.89 kg/m2  SpO2 98%  LMP 04/28/2015 Physical Exam  Constitutional: She is oriented to person, place, and time. She appears well-developed and well-nourished.  HENT:  Head: Normocephalic.  Neck: Normal range of motion. Neck supple.  Cardiovascular: Normal rate and regular rhythm.   Pulmonary/Chest: Effort normal.  Abdominal: Soft. Bowel sounds are normal. There is no tenderness. There is no rebound and no guarding.  Genitourinary:  Thick, white "cottage cheese" appearing vaginal discharge present. There is cervical bleeding present that is mild. No pelvic tenderness, no CMT, adnexal mass or tenderness.   Musculoskeletal: Normal range of motion.  Neurological: She is alert and oriented to person, place, and time. Coordination normal.  Skin: Skin is warm and dry. No rash noted.  Psychiatric: She has a normal mood and affect.    ED Course  Procedures (including critical care time) Labs Review Labs Reviewed  WET PREP, GENITAL - Abnormal; Notable for the following:    WBC, Wet Prep HPF POC FEW (*)    All other components within normal limits    Imaging Review No results found. I have personally reviewed and evaluated these images and lab results as part of my medical decision-making.   EKG Interpretation None      MDM   Final diagnoses:  None    1. Vaginal yeast infection 2. Irregular menses  The patient has been resting without complaint. Wet prep is negative, however, presence of vaginal discharge c/w yeast is found. Will treat with Diflucan. Feel she is ready for discharge home and is encouraged to follow up with primary care for further management of ongoing symptoms.     Charlann Lange, PA-C 06/14/15 1610  Rolland Porter, MD 06/14/15 [phone]

## 2015-06-14 NOTE — ED Notes (Signed)
Pt was recently diagnosed with UTI and STD at [facility]. Given antibiotics. Pt reports she is having vaginal itching with no discharge. Tonight, pt reports passing blood clots. Pt is on Coumadin.

## 2015-06-16 ENCOUNTER — Telehealth: Payer: Self-pay | Admitting: Pharmacist

## 2015-06-16 NOTE — Telephone Encounter (Signed)
Contacted patient to see if she was able to start the [place].  I was unable to reach her but I left a HIPAA-compliant message with a family member requesting that she call me back.    Nicoletta Ba, PharmD, BCPS, [place] [facility] [phone]

## 2015-06-17 ENCOUNTER — Telehealth: Payer: Self-pay | Admitting: Pharmacist

## 2015-06-17 ENCOUNTER — Emergency Department (HOSPITAL_COMMUNITY)
Admission: EM | Admit: 2015-06-17 | Discharge: 2015-06-17 | Disposition: A | Discharge status: Home / Self Care | Payer: Commercial Managed Care - PPO | Attending: Emergency Medicine | Admitting: Emergency Medicine

## 2015-06-17 ENCOUNTER — Encounter (HOSPITAL_COMMUNITY): Payer: Self-pay | Admitting: Emergency Medicine

## 2015-06-17 DIAGNOSIS — Z8669 Personal history of other diseases of the nervous system and sense organs: Secondary | ICD-10-CM | POA: Insufficient documentation

## 2015-06-17 DIAGNOSIS — Z8679 Personal history of other diseases of the circulatory system: Secondary | ICD-10-CM | POA: Insufficient documentation

## 2015-06-17 DIAGNOSIS — Z86718 Personal history of other venous thrombosis and embolism: Secondary | ICD-10-CM | POA: Diagnosis not present

## 2015-06-17 DIAGNOSIS — Z7901 Long term (current) use of anticoagulants: Secondary | ICD-10-CM | POA: Insufficient documentation

## 2015-06-17 DIAGNOSIS — Z792 Long term (current) use of antibiotics: Secondary | ICD-10-CM | POA: Diagnosis not present

## 2015-06-17 DIAGNOSIS — R2243 Localized swelling, mass and lump, lower limb, bilateral: Secondary | ICD-10-CM | POA: Insufficient documentation

## 2015-06-17 DIAGNOSIS — L259 Unspecified contact dermatitis, unspecified cause: Secondary | ICD-10-CM | POA: Insufficient documentation

## 2015-06-17 DIAGNOSIS — Z7952 Long term (current) use of systemic steroids: Secondary | ICD-10-CM | POA: Insufficient documentation

## 2015-06-17 DIAGNOSIS — Z79899 Other long term (current) drug therapy: Secondary | ICD-10-CM | POA: Insufficient documentation

## 2015-06-17 DIAGNOSIS — R21 Rash and other nonspecific skin eruption: Secondary | ICD-10-CM | POA: Diagnosis present

## 2015-06-17 DIAGNOSIS — Z87891 Personal history of nicotine dependence: Secondary | ICD-10-CM | POA: Diagnosis not present

## 2015-06-17 DIAGNOSIS — Z862 Personal history of diseases of the blood and blood-forming organs and certain disorders involving the immune mechanism: Secondary | ICD-10-CM | POA: Diagnosis not present

## 2015-06-17 DIAGNOSIS — Z87440 Personal history of urinary (tract) infections: Secondary | ICD-10-CM | POA: Diagnosis not present

## 2015-06-17 DIAGNOSIS — J45909 Unspecified asthma, uncomplicated: Secondary | ICD-10-CM | POA: Insufficient documentation

## 2015-06-17 DIAGNOSIS — L309 Dermatitis, unspecified: Secondary | ICD-10-CM

## 2015-06-17 MED ORDER — HYDROXYZINE HCL 25 MG PO TABS: 25.0000 mg | ORAL_TABLET | Freq: Four times a day (QID) | ORAL | Status: DC

## 2015-06-17 MED ORDER — HYDROCORTISONE 1 % EX CREA: 1.0000 "application " | TOPICAL_CREAM | Freq: Two times a day (BID) | CUTANEOUS | Status: DC

## 2015-06-17 MED ORDER — PREDNISONE 20 MG PO TABS: 40.0000 mg | ORAL_TABLET | Freq: Every day | ORAL | Status: DC

## 2015-06-17 MED ORDER — HYDROXYZINE HCL 25 MG PO TABS
25.0000 mg | ORAL_TABLET | Freq: Once | ORAL | Status: AC
  Administered 2015-06-17: 25 mg via ORAL
  Filled 2015-06-17: qty 1

## 2015-06-17 NOTE — ED Notes (Signed)
Declined W/C at D/C and was escorted to lobby by RN. 

## 2015-06-17 NOTE — Discharge Instructions (Signed)
Take your medications as prescribed. I also recommend using an emollient to keep ear skin hydrated. Follow-up with your primary care provider in the next 3-4 days. Return to the emergency department if symptoms worsen or new onset of fever, difficulty breathing.

## 2015-06-17 NOTE — Telephone Encounter (Signed)
Contacted patient to see if she was able to start the [place].  I was unable to reach her and I was unable to leave a message.  I called [facility] to see if the prescription for Xarelto had ever been picked up. It has NOT been picked up due to it not being covered by insurance. Therefore will assume patient is still taking warfarin at this time.  Patient needs to follow up with [facility] as soon as possible. Will d/c Xarelto from patient medication list.    Nicoletta Ba, PharmD, BCPS, [place] [facility] [phone]

## 2015-06-17 NOTE — ED Notes (Addendum)
Pt states last night she noticed some swelling and a rash to bilateral feet. This am pt started itching on both upper arms with small red raised bumps on arms. Denies any chest pain or sob. Pt on flagyl and keflex for UTI.

## 2015-06-17 NOTE — ED Provider Notes (Signed)
CSN: [id number]     Arrival date & time 06/17/15  1038 History  By signing my name below, I, Emmanuella Mensah, attest that this documentation has been prepared under the direction and in the presence of Harlene Ramus, PA-C. Electronically Signed: Judithann Sauger, ED Scribe. 06/17/2015. 1:28 PM     Chief Complaint  Patient presents with  . Rash    The history is provided by the patient. No language interpreter was used.   HPI Comments: Yesenia Wheeler is a 44 y.o. female with a hx of asthma, anemia who presents to the Emergency Department complaining of generalized body itching rash and bilateral foot swelling onset last night. She reports associated blisters on the top of her bilateral foot and hands onset this am. She denies any new lotions, soaps, or detergent. She denies being outside or any possible poison ivy contact. No alleviating factors noted. She reports that she had a UTI and was placed on Keflex 500 mg four times a day by Will Dansie, PA-C on 06/08/15. She was also placed on Flagyl by Ottie Glazier, PA-C on 06/10/15 following a diagnosis of trichomonas vaginitis. She states that she has used Kelflex before in the past.   Past Medical History  Diagnosis Date  . Asthma   . Migraines   . Bone spur 2008  . Anemia   . DVT (deep venous thrombosis) ([place]) 1995    had DVT x4, 2 DVTs in pregnancy and 2 outside of pregnancy    Past Surgical History  Procedure Laterality Date  . Shoulder surgery Left 2008    Bone Spur removal  . Cholecystectomy    . Gastric bypass    . Knee surgery    . Cesarean section     Family History  Problem Relation Age of Onset  . Cancer Father     oral   Social History  Substance Use Topics  . Smoking status: Former Research scientist (life sciences)  . Smokeless tobacco: Never Used  . Alcohol Use: Yes     Comment: less than once a week-usually only holidays/special occasions   OB History    Gravida Para Term Preterm AB TAB SAB Ectopic Multiple Living    13    13  13   3      Review of Systems  Constitutional: Negative for fever.  Musculoskeletal: Positive for joint swelling.  Skin: Positive for rash.       Generalized itchy skin Blisters on top of bilateral foot and on bilateral hands      Allergies  Review of patient's allergies indicates no known allergies.  Home Medications   Prior to Admission medications   Medication Sig Start Date End Date Taking? Authorizing Provider  acetaminophen (TYLENOL) 500 MG tablet Take 1 tablet (500 mg total) by mouth every 6 (six) hours as needed. Patient not taking: Reported on 06/14/2015 06/08/15   Waynetta Pean, PA-C  albuterol (PROVENTIL HFA;VENTOLIN HFA) 108 (90 BASE) MCG/ACT inhaler Inhale 2 puffs into the lungs every 6 (six) hours as needed for wheezing. Patient not taking: Reported on 06/14/2015 06/10/14   Thao P Le, DO  albuterol (PROVENTIL) (2.5 MG/3ML) 0.083% nebulizer solution Take 3 mLs (2.5 mg total) by nebulization every 4 (four) hours as needed for wheezing or shortness of breath. Patient not taking: Reported on 06/14/2015 06/10/14   Thao P Le, DO  cephALEXin (KEFLEX) 500 MG capsule Take 1 capsule (500 mg total) by mouth 4 (four) times daily. 06/08/15   Waynetta Pean, PA-C  fluconazole (DIFLUCAN)  150 MG tablet Take 1 tablet (150 mg total) by mouth daily. 06/14/15 06/21/15  Charlann Lange, PA-C  hydrocortisone cream 1 % Apply 1 application topically 2 (two) times daily. 06/17/15   Nona Dell, PA-C  hydrOXYzine (ATARAX/VISTARIL) 25 MG tablet Take 1 tablet (25 mg total) by mouth every 6 (six) hours. 06/17/15   Nona Dell, PA-C  methocarbamol (ROBAXIN) 500 MG tablet Take 1 tablet (500 mg total) by mouth 2 (two) times daily as needed for muscle spasms. Patient not taking: Reported on 06/14/2015 06/08/15   Waynetta Pean, PA-C  metroNIDAZOLE (FLAGYL) 500 MG tablet Take 1 tablet (500 mg total) by mouth 2 (two) times daily. 06/10/15   Hanna Patel-Mills, PA-C   predniSONE (DELTASONE) 20 MG tablet Take 2 tablets (40 mg total) by mouth daily. Take 40 mg by mouth daily for 3 days, then 20mg  by mouth daily for 3 days, then 10mg  daily for 3 days 06/17/15   Nona Dell, PA-C  rivaroxaban (XARELTO) 20 MG TABS tablet Take 1 tablet (20 mg total) by mouth daily with supper. Patient not taking: Reported on 06/14/2015 05/25/15   Tresa Garter, MD  traMADol (ULTRAM) 50 MG tablet Take 1 tablet (50 mg total) by mouth every 6 (six) hours as needed. 06/10/15   Hanna Patel-Mills, PA-C  warfarin (COUMADIN) 5 MG tablet Take 1 tablet (5 mg total) by mouth daily at 6 PM. 05/25/15   Olugbemiga E Jegede, MD   BP 130/84 mmHg  Pulse 72  Temp(Src) 98.6 F (37 C) (Oral)  Resp 16  Ht 5\' 4"  (1.626 m)  Wt 215 lb (97.523 kg)  BMI 36.89 kg/m2  SpO2 100%  LMP 04/21/2015 Physical Exam  Constitutional: She is oriented to person, place, and time. She appears well-developed and well-nourished. No distress.  HENT:  Head: Normocephalic and atraumatic.  Nose: Nose normal.  Mouth/Throat: Uvula is midline, oropharynx is clear and moist and mucous membranes are normal. No oropharyngeal exudate, posterior oropharyngeal edema or posterior oropharyngeal erythema.  Eyes: Conjunctivae and EOM are normal.  Neck: Neck supple. No tracheal deviation present.  Cardiovascular: Normal rate, regular rhythm and normal heart sounds.   No murmur heard. Pulmonary/Chest: Effort normal and breath sounds normal. No respiratory distress. She has no wheezes. She has no rales.  Musculoskeletal: Normal range of motion. She exhibits no edema or tenderness.  Lymphadenopathy:    She has no cervical adenopathy.  Neurological: She is alert and oriented to person, place, and time.  Skin: Skin is warm and dry.  Xerosis noted to bilateral feet, arms, back. Multiple areas of erythematous maculopapular rash noted to dorsal feet, dorsal hand, arms, back, and abdomen.  No drainage, no vesicles, no  crusting.   Psychiatric: She has a normal mood and affect. Her behavior is normal.  Nursing note and vitals reviewed.   ED Course  Procedures (including critical care time) DIAGNOSTIC STUDIES: Oxygen Saturation is 100% on RA, normal by my interpretation.    COORDINATION OF CARE: 12:55 PM- Pt advised of plan for treatment and pt agrees. Will receive Prednisone and a prescription for Hydrocortizone cream. Pt recommended to stop taking the medication and to keep her skin hydrated. Advised to return if blisters worsen or if she develops a fever.    Labs Review Labs Reviewed - No data to display  Imaging Review No results found.  Filed Vitals:   06/17/15 1328  BP: 112/70  Pulse: 59  Temp: 98.2 F (36.8 C)  Resp: 16  MDM   Final diagnoses:  Dermatitis    Patient presents with itching rash. Reports recently starting Keflex for UTI and Flagyl for trich, denies any other new known irritants. VSS. Exam revealed multiple small erythematous maculopapular lesions to feet, hands, arms, back and abdomen, no vesicles/pustules/bulla. Lungs CTAB, no respiratory distress. I suspect rash is likely due to contact dermatitis or may be a side effect from the new antibiotics. There does not appear to be any bacterial infection at this time. Plan to d/c pt home with prednisone taper, antihistamine and steroid ointment.   Evaluation does not show pathology requring ongoing emergent intervention or admission. Pt is hemodynamically stable and mentating appropriately. Discussed findings/results and plan with patient/guardian, who agrees with plan. All questions answered. Return precautions discussed and outpatient follow up given.    I personally performed the services described in this documentation, which was scribed in my presence. The recorded information has been reviewed and is accurate.    Chesley Noon [place], [place] 06/17/15 [place], MD 06/17/15 1931

## 2015-06-21 ENCOUNTER — Other Ambulatory Visit (HOSPITAL_COMMUNITY)
Admission: RE | Admit: 2015-06-21 | Discharge: 2015-06-21 | Disposition: A | Discharge status: Home / Self Care | Payer: Commercial Managed Care - PPO | Source: Ambulatory Visit | Attending: Family Medicine | Admitting: Family Medicine

## 2015-06-21 ENCOUNTER — Encounter: Payer: Self-pay | Admitting: Family Medicine

## 2015-06-21 ENCOUNTER — Ambulatory Visit: Payer: Commercial Managed Care - PPO | Attending: Family Medicine | Admitting: Family Medicine

## 2015-06-21 VITALS — BP 117/80 | HR 79 | Temp 98.6°F | Resp 16 | Ht 64.0 in | Wt 231.0 lb

## 2015-06-21 DIAGNOSIS — Z01419 Encounter for gynecological examination (general) (routine) without abnormal findings: Secondary | ICD-10-CM | POA: Insufficient documentation

## 2015-06-21 DIAGNOSIS — Z113 Encounter for screening for infections with a predominantly sexual mode of transmission: Secondary | ICD-10-CM | POA: Insufficient documentation

## 2015-06-21 DIAGNOSIS — B9689 Other specified bacterial agents as the cause of diseases classified elsewhere: Secondary | ICD-10-CM

## 2015-06-21 DIAGNOSIS — A499 Bacterial infection, unspecified: Secondary | ICD-10-CM

## 2015-06-21 DIAGNOSIS — R300 Dysuria: Secondary | ICD-10-CM | POA: Insufficient documentation

## 2015-06-21 DIAGNOSIS — N760 Acute vaginitis: Secondary | ICD-10-CM

## 2015-06-21 DIAGNOSIS — B353 Tinea pedis: Secondary | ICD-10-CM | POA: Insufficient documentation

## 2015-06-21 DIAGNOSIS — Z124 Encounter for screening for malignant neoplasm of cervix: Secondary | ICD-10-CM | POA: Diagnosis not present

## 2015-06-21 DIAGNOSIS — Z1151 Encounter for screening for human papillomavirus (HPV): Secondary | ICD-10-CM | POA: Diagnosis not present

## 2015-06-21 DIAGNOSIS — A5901 Trichomonal vulvovaginitis: Secondary | ICD-10-CM | POA: Insufficient documentation

## 2015-06-21 LAB — POCT URINALYSIS DIPSTICK
Blood, UA: NEGATIVE
Glucose, UA: NEGATIVE
Ketones, UA: NEGATIVE
LEUKOCYTES UA: NEGATIVE
NITRITE UA: NEGATIVE
PROTEIN UA: NEGATIVE
Spec Grav, UA: 1.025
Urobilinogen, UA: 0.2
pH, UA: 5.5

## 2015-06-21 LAB — POCT GLYCOSYLATED HEMOGLOBIN (HGB A1C): Hemoglobin A1C: 5.3

## 2015-06-21 LAB — GLUCOSE, POCT (MANUAL RESULT ENTRY): POC GLUCOSE: 73 mg/dL (ref 70–99)

## 2015-06-21 MED ORDER — FLUCONAZOLE 150 MG PO TABS: 150.0000 mg | ORAL_TABLET | ORAL | Status: DC

## 2015-06-21 MED ORDER — KETOCONAZOLE 2 % EX CREA: 1.0000 "application " | TOPICAL_CREAM | Freq: Every day | CUTANEOUS | Status: DC

## 2015-06-21 NOTE — Patient Instructions (Addendum)
Yesenia Wheeler was seen today for gynecologic exam.  Diagnoses and all orders for this visit:  Dysuria -     POCT urinalysis dipstick  Pap smear for cervical cancer screening -     Cytology - PAP [place]  Trichomonal vaginitis -     Cytology - PAP [place]  Tinea pedis of both feet -     fluconazole (DIFLUCAN) 150 MG tablet; Take 1 tablet (150 mg total) by mouth every 7 (seven) days. -     ketoconazole (NIZORAL) 2 % cream; Apply 1 application topically daily. -     HgB A1c -     Glucose (CBG)   You will be called with pap results Take diflucan now and in one week Use nizoral cream on feet  F/u in 3 months for low back pain and asthma  Dr. Adrian Blackwater

## 2015-06-21 NOTE — Assessment & Plan Note (Signed)
Stop steroid cream Diflucan weekly x 2 weeks nizoral cream

## 2015-06-21 NOTE — Progress Notes (Signed)
Pap smear, last pap smear with  normal results  Stated was in the ER due to UTI and yeast infection  No pain today  No tobacco user

## 2015-06-21 NOTE — Progress Notes (Signed)
Patient ID: Yesenia Wheeler, female   DOB: Sep 24, 1970, 44 y.o.   MRN: [id number]   Subjective:  Patient ID: Yesenia Wheeler, female    DOB: Mar 09, 1971  Age: 44 y.o. MRN: [id number]  CC: Gynecologic Exam   HPI Yesenia Wheeler presents for    1. Screening pap: had recent vaginal discharge. vwent to ED. Dx with trichomonas on 06/10/2015. Took flagyl. Also informed partner who was treated.   2. UTI: went to ED on 06/08/2015. Had > 100 K amp resistant klebsiella. Denies dysuria.   3.  Tinea pedis: itching, dark skin and small fluid blisters on dorsum of both feet. Patient lanced blister. The one on her R foot reformed after draining clear liquid. No redness. Using hydrocortisone cream on feet.   Social History  Substance Use Topics  . Smoking status: Former Research scientist (life sciences)  . Smokeless tobacco: Never Used  . Alcohol Use: Yes     Comment: less than once a week-usually only holidays/special occasions    Outpatient Prescriptions Prior to Visit  Medication Sig Dispense Refill  . acetaminophen (TYLENOL) 500 MG tablet Take 1 tablet (500 mg total) by mouth every 6 (six) hours as needed. 30 tablet 0  . albuterol (PROVENTIL HFA;VENTOLIN HFA) 108 (90 BASE) MCG/ACT inhaler Inhale 2 puffs into the lungs every 6 (six) hours as needed for wheezing. 1 Inhaler 11  . albuterol (PROVENTIL) (2.5 MG/3ML) 0.083% nebulizer solution Take 3 mLs (2.5 mg total) by nebulization every 4 (four) hours as needed for wheezing or shortness of breath. 75 mL 11  . hydrocortisone cream 1 % Apply 1 application topically 2 (two) times daily. 30 g 2  . cephALEXin (KEFLEX) 500 MG capsule Take 1 capsule (500 mg total) by mouth 4 (four) times daily. (Patient not taking: Reported on 06/21/2015) 40 capsule 0  . fluconazole (DIFLUCAN) 150 MG tablet Take 1 tablet (150 mg total) by mouth daily. (Patient not taking: Reported on 06/21/2015) 1 tablet 0  . hydrOXYzine (ATARAX/VISTARIL) 25 MG tablet  Take 1 tablet (25 mg total) by mouth every 6 (six) hours. (Patient not taking: Reported on 06/21/2015) 12 tablet 0  . methocarbamol (ROBAXIN) 500 MG tablet Take 1 tablet (500 mg total) by mouth 2 (two) times daily as needed for muscle spasms. (Patient not taking: Reported on 06/14/2015) 20 tablet 0  . metroNIDAZOLE (FLAGYL) 500 MG tablet Take 1 tablet (500 mg total) by mouth 2 (two) times daily. (Patient not taking: Reported on 06/21/2015) 14 tablet 0  . predniSONE (DELTASONE) 20 MG tablet Take 2 tablets (40 mg total) by mouth daily. Take 40 mg by mouth daily for 3 days, then 20mg  by mouth daily for 3 days, then 10mg  daily for 3 days (Patient not taking: Reported on 06/21/2015) 12 tablet 0  . traMADol (ULTRAM) 50 MG tablet Take 1 tablet (50 mg total) by mouth every 6 (six) hours as needed. (Patient not taking: Reported on 06/21/2015) 15 tablet 0  . warfarin (COUMADIN) 5 MG tablet Take 1 tablet (5 mg total) by mouth daily at 6 PM. (Patient not taking: Reported on 06/21/2015) 30 tablet 1   No facility-administered medications prior to visit.    ROS Review of Systems  Constitutional: Negative for fever and chills.  Eyes: Negative for visual disturbance.  Respiratory: Negative for shortness of breath.   Cardiovascular: Negative for chest pain.  Gastrointestinal: Negative for abdominal pain and blood in stool.  Genitourinary: Negative for vaginal bleeding, vaginal discharge, vaginal pain and menstrual problem.  Musculoskeletal:  Negative for back pain and arthralgias.  Skin: Positive for color change and rash.  Allergic/Immunologic: Negative for immunocompromised state.  Hematological: Negative for adenopathy. Does not bruise/bleed easily.  Psychiatric/Behavioral: Negative for suicidal ideas and dysphoric mood.    Objective:  BP 117/80 mmHg  Pulse 79  Temp(Src) 98.6 F (37 C) (Oral)  Resp 16  Ht 5\' 4"  (1.626 m)  Wt 231 lb (104.781 kg)  BMI 39.63 kg/m2  SpO2 100%  LMP 04/21/2015  BP/Weight  06/21/2015 06/17/2015 98/06/9146  Systolic BP 829 562 130  Diastolic BP 80 70 67  Wt. (Lbs) 231 215 215  BMI 39.63 36.89 36.89    Physical Exam  Constitutional: She is oriented to person, place, and time. She appears well-developed and well-nourished. No distress.  HENT:  Head: Normocephalic and atraumatic.  Cardiovascular: Normal rate, regular rhythm, normal heart sounds and intact distal pulses.   Pulmonary/Chest: Effort normal and breath sounds normal.  Genitourinary: Vagina normal and uterus normal. Pelvic exam was performed with patient prone. There is no rash, tenderness or lesion on the right labia. There is no rash, tenderness or lesion on the left labia. Cervix exhibits no motion tenderness, no discharge and no friability.  Vaginal odor  With scant white discharge   Musculoskeletal: She exhibits no edema.  Lymphadenopathy:       Right: No inguinal adenopathy present.       Left: No inguinal adenopathy present.  Neurological: She is alert and oriented to person, place, and time.  Skin: Skin is warm and dry. No rash noted.  Darkness With maceration between toes Clear fluid filled blister on dorsum or R foot between great and 2nd toe   Psychiatric: She has a normal mood and affect.     Assessment & Plan:   Problem List Items Addressed This Visit    Tinea pedis    Stop steroid cream Diflucan weekly x 2 weeks nizoral cream       Relevant Medications   fluconazole (DIFLUCAN) 150 MG tablet   ketoconazole (NIZORAL) 2 % cream   Other Relevant Orders   HgB A1c (Completed)   Glucose (CBG) (Completed)    Other Visit Diagnoses    Dysuria    -  Primary    Relevant Orders    POCT urinalysis dipstick (Completed)    Pap smear for cervical cancer screening        Relevant Orders    Cytology - PAP [place]    Trichomonal vaginitis        Relevant Medications    fluconazole (DIFLUCAN) 150 MG tablet    ketoconazole (NIZORAL) 2 % cream    Other Relevant Orders    Cytology  - PAP [place]       No orders of the defined types were placed in this encounter.    Follow-up: No Follow-up on file.   Boykin Nearing MD

## 2015-06-22 LAB — CERVICOVAGINAL ANCILLARY ONLY
Chlamydia: NEGATIVE
NEISSERIA GONORRHEA: NEGATIVE
Trichomonas: NEGATIVE

## 2015-06-23 ENCOUNTER — Encounter: Payer: Self-pay | Admitting: Family Medicine

## 2015-06-23 DIAGNOSIS — N760 Acute vaginitis: Secondary | ICD-10-CM

## 2015-06-23 DIAGNOSIS — B9689 Other specified bacterial agents as the cause of diseases classified elsewhere: Secondary | ICD-10-CM | POA: Insufficient documentation

## 2015-06-23 LAB — CYTOLOGY - PAP

## 2015-06-23 LAB — CERVICOVAGINAL ANCILLARY ONLY: WET PREP (BD AFFIRM): POSITIVE — AB

## 2015-06-23 MED ORDER — METRONIDAZOLE 500 MG PO TABS: 500.0000 mg | ORAL_TABLET | Freq: Two times a day (BID) | ORAL | Status: AC

## 2015-06-23 NOTE — Addendum Note (Signed)
Addended by: Boykin Nearing on: 06/23/2015 08:34 AM   Modules accepted: Orders, SmartSet

## 2015-06-24 ENCOUNTER — Telehealth: Payer: Self-pay | Admitting: Pharmacist

## 2015-06-24 NOTE — Telephone Encounter (Signed)
Called patient to follow up on warfarin management. Patient has not returned to clinic for INR monitoring and did not start the Xarelto as it is not covered by her insurance.   Patient reports that she is still taking warfarin. She denies any s/sx of bleeding.  Stressed the importance of INR monitoring with warfarin due to the risk of clots or bleeding.  Patient verbalized understanding and reported that she would make an appointment when she was able to, despite telling her the risks of not closely monitoring her INR.   Nicoletta Ba, PharmD, BCPS, [place] [facility] [phone]

## 2015-08-17 ENCOUNTER — Encounter (HOSPITAL_COMMUNITY): Payer: Self-pay

## 2015-08-17 ENCOUNTER — Emergency Department (INDEPENDENT_AMBULATORY_CARE_PROVIDER_SITE_OTHER)
Admission: EM | Admit: 2015-08-17 | Discharge: 2015-08-17 | Disposition: A | Discharge status: Home / Self Care | Payer: Commercial Managed Care - PPO | Source: Home / Self Care | Attending: Emergency Medicine | Admitting: Emergency Medicine

## 2015-08-17 ENCOUNTER — Emergency Department (HOSPITAL_COMMUNITY): Payer: Commercial Managed Care - PPO

## 2015-08-17 ENCOUNTER — Emergency Department (HOSPITAL_COMMUNITY)
Admission: EM | Admit: 2015-08-17 | Discharge: 2015-08-17 | Disposition: A | Discharge status: Home / Self Care | Payer: Commercial Managed Care - PPO | Attending: Emergency Medicine | Admitting: Emergency Medicine

## 2015-08-17 ENCOUNTER — Encounter (HOSPITAL_COMMUNITY): Payer: Self-pay | Admitting: Emergency Medicine

## 2015-08-17 DIAGNOSIS — J45909 Unspecified asthma, uncomplicated: Secondary | ICD-10-CM | POA: Diagnosis not present

## 2015-08-17 DIAGNOSIS — Z87891 Personal history of nicotine dependence: Secondary | ICD-10-CM | POA: Insufficient documentation

## 2015-08-17 DIAGNOSIS — Z86718 Personal history of other venous thrombosis and embolism: Secondary | ICD-10-CM | POA: Diagnosis not present

## 2015-08-17 DIAGNOSIS — R112 Nausea with vomiting, unspecified: Secondary | ICD-10-CM

## 2015-08-17 DIAGNOSIS — Z862 Personal history of diseases of the blood and blood-forming organs and certain disorders involving the immune mechanism: Secondary | ICD-10-CM | POA: Insufficient documentation

## 2015-08-17 DIAGNOSIS — Z8679 Personal history of other diseases of the circulatory system: Secondary | ICD-10-CM | POA: Diagnosis not present

## 2015-08-17 DIAGNOSIS — Z3202 Encounter for pregnancy test, result negative: Secondary | ICD-10-CM | POA: Insufficient documentation

## 2015-08-17 DIAGNOSIS — R072 Precordial pain: Secondary | ICD-10-CM

## 2015-08-17 DIAGNOSIS — R0789 Other chest pain: Secondary | ICD-10-CM | POA: Insufficient documentation

## 2015-08-17 DIAGNOSIS — R079 Chest pain, unspecified: Secondary | ICD-10-CM

## 2015-08-17 DIAGNOSIS — R1010 Upper abdominal pain, unspecified: Secondary | ICD-10-CM | POA: Insufficient documentation

## 2015-08-17 DIAGNOSIS — Z8739 Personal history of other diseases of the musculoskeletal system and connective tissue: Secondary | ICD-10-CM | POA: Insufficient documentation

## 2015-08-17 LAB — CBC WITH DIFFERENTIAL/PLATELET
BASOS ABS: 0.1 10*3/uL (ref 0.0–0.1)
BASOS PCT: 1 %
EOS PCT: 6 %
Eosinophils Absolute: 0.3 10*3/uL (ref 0.0–0.7)
HEMATOCRIT: 34.6 % — AB (ref 36.0–46.0)
Hemoglobin: 11.3 g/dL — ABNORMAL LOW (ref 12.0–15.0)
LYMPHS PCT: 48 %
Lymphs Abs: 2.2 10*3/uL (ref 0.7–4.0)
MCH: 26.6 pg (ref 26.0–34.0)
MCHC: 32.7 g/dL (ref 30.0–36.0)
MCV: 81.4 fL (ref 78.0–100.0)
MONO ABS: 0.3 10*3/uL (ref 0.1–1.0)
Monocytes Relative: 6 %
NEUTROS ABS: 1.9 10*3/uL (ref 1.7–7.7)
Neutrophils Relative %: 39 %
PLATELETS: 237 10*3/uL (ref 150–400)
RBC: 4.25 MIL/uL (ref 3.87–5.11)
RDW: 15 % (ref 11.5–15.5)
WBC: 4.7 10*3/uL (ref 4.0–10.5)

## 2015-08-17 LAB — URINALYSIS, ROUTINE W REFLEX MICROSCOPIC
Bilirubin Urine: NEGATIVE
GLUCOSE, UA: NEGATIVE mg/dL
HGB URINE DIPSTICK: NEGATIVE
KETONES UR: NEGATIVE mg/dL
LEUKOCYTES UA: NEGATIVE
Nitrite: NEGATIVE
PROTEIN: NEGATIVE mg/dL
Specific Gravity, Urine: 1.02 (ref 1.005–1.030)
pH: 7.5 (ref 5.0–8.0)

## 2015-08-17 LAB — HEPATIC FUNCTION PANEL
ALT: 13 U/L — AB (ref 14–54)
AST: 22 U/L (ref 15–41)
Albumin: 3.6 g/dL (ref 3.5–5.0)
Alkaline Phosphatase: 121 U/L (ref 38–126)
TOTAL PROTEIN: 6.8 g/dL (ref 6.5–8.1)
Total Bilirubin: 0.3 mg/dL (ref 0.3–1.2)

## 2015-08-17 LAB — BASIC METABOLIC PANEL
Anion gap: 7 (ref 5–15)
BUN: 9 mg/dL (ref 6–20)
CALCIUM: 8.7 mg/dL — AB (ref 8.9–10.3)
CO2: 25 mmol/L (ref 22–32)
CREATININE: 0.81 mg/dL (ref 0.44–1.00)
Chloride: 110 mmol/L (ref 101–111)
GFR calc Af Amer: >60 mL/min (ref >60)
GLUCOSE: 89 mg/dL (ref 65–99)
Potassium: 4.1 mmol/L (ref 3.5–5.1)
Sodium: 142 mmol/L (ref 135–145)

## 2015-08-17 LAB — I-STAT TROPONIN, ED: TROPONIN I, POC: 0 ng/mL (ref 0.00–0.08)

## 2015-08-17 LAB — PREGNANCY, URINE: Preg Test, Ur: NEGATIVE

## 2015-08-17 LAB — LIPASE, BLOOD: Lipase: 28 U/L (ref 11–51)

## 2015-08-17 LAB — POCT PREGNANCY, URINE: PREG TEST UR: NEGATIVE

## 2015-08-17 MED ORDER — ONDANSETRON HCL 4 MG/2ML IJ SOLN
4.0000 mg | Freq: Once | INTRAMUSCULAR | Status: AC
  Administered 2015-08-17: 4 mg via INTRAVENOUS
  Filled 2015-08-17: qty 2

## 2015-08-17 MED ORDER — LORAZEPAM 2 MG/ML IJ SOLN
0.5000 mg | Freq: Once | INTRAMUSCULAR | Status: AC
  Administered 2015-08-17: 0.5 mg via INTRAVENOUS
  Filled 2015-08-17: qty 1

## 2015-08-17 MED ORDER — SODIUM CHLORIDE 0.9 % IV BOLUS (SEPSIS)
1000.0000 mL | Freq: Once | INTRAVENOUS | Status: AC
  Administered 2015-08-17: 1000 mL via INTRAVENOUS

## 2015-08-17 MED ORDER — ONDANSETRON HCL 4 MG PO TABS: 4.0000 mg | ORAL_TABLET | Freq: Three times a day (TID) | ORAL | Status: DC | PRN

## 2015-08-17 MED ORDER — FAMOTIDINE 20 MG PO TABS
20.0000 mg | ORAL_TABLET | Freq: Once | ORAL | Status: AC
  Administered 2015-08-17: 20 mg via ORAL
  Filled 2015-08-17: qty 1

## 2015-08-17 MED ORDER — GI COCKTAIL ~~LOC~~
30.0000 mL | Freq: Once | ORAL | Status: AC
  Administered 2015-08-17: 30 mL via ORAL
  Filled 2015-08-17: qty 30

## 2015-08-17 NOTE — ED Provider Notes (Signed)
CSN: [id number]     Arrival date & time 08/17/15  1743 History   First MD Initiated Contact with Patient 08/17/15 1805     Chief Complaint  Patient presents with  . Abdominal Pain  . Chest Pain   45 year old female who presents today with upper abdominal pain and nausea vomiting present for 5 days, and chest pain present for 2 days. Patient endorses a generalized aching sensation in her abdomen. She's had mild nausea and vomiting has been persistent for 5 days with no help from Phenergan or Zofran. She denies any diarrhea, fevers, UTI symptoms, sick contacts, recent travel, recent antibiotic use.  For the past 2 days she's had a substernal/lower chest pain that feels pressure-like in sensation. Not accompanied by shortness of breath, diaphoresis. Does not smoke, have diabetes or any cardiac risk factors.  (Consider location/radiation/quality/duration/timing/severity/associated sxs/prior Treatment) Patient is a 45 y.o. female presenting with chest pain.  Chest Pain Pain location:  Substernal area Pain quality: aching and pressure   Pain radiates to:  Does not radiate Pain radiates to the back: no   Pain severity:  Mild Onset quality:  Sudden Duration:  2 days Timing:  Constant Progression:  Worsening Context: at rest   Context comment:  Worse with vomiting Relieved by:  Nothing Worsened by:  Nothing tried Ineffective treatments:  None tried Associated symptoms: abdominal pain, nausea and vomiting   Associated symptoms: no dizziness, no fever, no headache, no palpitations and no shortness of breath     Past Medical History  Diagnosis Date  . Asthma   . Migraines   . Bone spur 2008  . Anemia   . 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 Shoulder surgery Left 2008    Bone Spur removal  . Cholecystectomy    . Gastric bypass    . Knee surgery    . Cesarean  section     Family History  Problem Relation Age of Onset  . Cancer Father     oral   Social History  Substance Use Topics  . Smoking status: Former Research scientist (life sciences)  . Smokeless tobacco: Never Used  . Alcohol Use: Yes     Comment: less than once a week-usually only holidays/special occasions   OB History    Gravida Para Term Preterm AB TAB SAB Ectopic Multiple Living   13    13  13   3      Review of Systems  Constitutional: Negative for fever and chills.  Respiratory: Negative for shortness of breath.   Cardiovascular: Positive for chest pain. Negative for palpitations and leg swelling.  Gastrointestinal: Positive for nausea, vomiting and abdominal pain. Negative for diarrhea, constipation and abdominal distention.  Genitourinary: Negative for dysuria, frequency, flank pain and decreased urine volume.  Neurological: Negative for dizziness, speech difficulty, light-headedness and headaches.  All other systems reviewed and are negative.     Allergies  Review of patient's allergies indicates no known allergies.  Home Medications   Prior to Admission medications   Medication Sig Start Date End Date Taking? Authorizing Provider  albuterol (PROVENTIL HFA;VENTOLIN HFA) 108 (90 BASE) MCG/ACT inhaler Inhale 2 puffs into the lungs every 6 (six) hours as needed for wheezing. 06/10/14  Yes Thao P Le, DO  albuterol (PROVENTIL) (2.5 MG/3ML) 0.083% nebulizer solution Take 3 mLs (2.5 mg total) by nebulization every 4 (  four) hours as needed for wheezing or shortness of breath. 06/10/14  Yes Thao P Le, DO  acetaminophen (TYLENOL) 500 MG tablet Take 1 tablet (500 mg total) by mouth every 6 (six) hours as needed. Patient not taking: Reported on 08/17/2015 06/08/15   Waynetta Pean, PA-C  fluconazole (DIFLUCAN) 150 MG tablet Take 1 tablet (150 mg total) by mouth every 7 (seven) days. Patient not taking: Reported on 08/17/2015 06/21/15   Boykin Nearing, MD  hydrocortisone cream 1 % Apply 1 application  topically 2 (two) times daily. Patient not taking: Reported on 08/17/2015 06/17/15   Nona Dell, PA-C  hydrOXYzine (ATARAX/VISTARIL) 25 MG tablet Take 1 tablet (25 mg total) by mouth every 6 (six) hours. Patient not taking: Reported on 06/21/2015 06/17/15   Nona Dell, PA-C  ketoconazole (NIZORAL) 2 % cream Apply 1 application topically daily. Patient not taking: Reported on 08/17/2015 06/21/15   Boykin Nearing, MD  methocarbamol (ROBAXIN) 500 MG tablet Take 1 tablet (500 mg total) by mouth 2 (two) times daily as needed for muscle spasms. Patient not taking: Reported on 06/14/2015 06/08/15   Waynetta Pean, PA-C  ondansetron (ZOFRAN) 4 MG tablet Take 1 tablet (4 mg total) by mouth every 8 (eight) hours as needed for nausea or vomiting. 08/17/15   Sherian Maroon, MD   BP 95/59 mmHg  Pulse 65  Temp(Src) 98 F (36.7 C) (Oral)  Resp 13  SpO2 99%  LMP 07/03/2015 Physical Exam  Constitutional: She is oriented to person, place, and time. She appears well-developed and well-nourished. No distress.  HENT:  Head: Normocephalic and atraumatic.  Cardiovascular: Normal rate, regular rhythm, normal heart sounds and intact distal pulses.  Exam reveals no gallop and no friction rub.   No murmur heard. Pulmonary/Chest: Effort normal and breath sounds normal. No respiratory distress. She has no wheezes. She has no rales. She exhibits no tenderness.  Abdominal: Soft. Bowel sounds are normal. She exhibits no distension and no mass. There is no tenderness. There is no rebound and no guarding.  Lymphadenopathy:    She has no cervical adenopathy.  Neurological: She is alert and oriented to person, place, and time. No cranial nerve deficit. Coordination normal.  Skin: Skin is warm and dry. She is not diaphoretic.  Nursing note and vitals reviewed.   ED Course  Procedures (including critical care time) Labs Review Labs Reviewed  CBC WITH DIFFERENTIAL/PLATELET - Abnormal; Notable for the  following:    Hemoglobin 11.3 (*)    HCT 34.6 (*)    All other components within normal limits  BASIC METABOLIC PANEL - Abnormal; Notable for the following:    Calcium 8.7 (*)    All other components within normal limits  HEPATIC FUNCTION PANEL - Abnormal; Notable for the following:    ALT 13 (*)    Bilirubin, Direct <0.1 (*)    All other components within normal limits  LIPASE, BLOOD  PREGNANCY, URINE  URINALYSIS, ROUTINE W REFLEX MICROSCOPIC (NOT AT [facility])  POC URINE PREG, ED  Randolm Idol, ED    Imaging Review Dg Chest 2 View  08/17/2015  CLINICAL DATA:  Mid chest pain for 1 week.  Nausea and vomiting. EXAM: CHEST  2 VIEW COMPARISON:  03/16/2015 FINDINGS: Lower lung volumes from prior exam. Minimal retrocardiac subsegmental atelectasis. The cardiomediastinal contours are normal for technique. Pulmonary vasculature is normal. No consolidation, pleural effusion, or pneumothorax. No acute osseous abnormalities are seen. Surgical clips in the right and left upper quadrants of the abdomen.  IMPRESSION: Minimal atelectasis at the left lung base. Otherwise no acute process. Electronically Signed   By: Jeb Levering M.D.   On: 08/17/2015 19:12   I have personally reviewed and evaluated these images and lab results as part of my medical decision-making.   EKG Interpretation   Date/Time:  Tuesday August 17 2015 17:50:06 EST Ventricular Rate:  57 PR Interval:  139 QRS Duration: 77 QT Interval:  425 QTC Calculation: 414 R Axis:   60 Text Interpretation:  Sinus rhythm Low voltage, precordial leads Abnormal  R-wave progression, early transition No significant change since last  tracing Confirmed by Maryan Rued  MD, Loree Fee ([id number]) on 08/17/2015 5:57:32 PM      MDM   Final diagnoses:  Nausea and vomiting, vomiting of unspecified type  Substernal chest pain   45 year old female here with abdominal and chest pain. See history of present illness for details. On exam patient and NAD,  afebrile, vital signs stable. Abdomen benign. Chest pain nonreproducible.  Given chest pain will workup for possible ACS, though not consistent. Considered but not consistent with esophageal perforation, dissection, pneumonia, pneumothorax, pericarditis, PE (perc negative, wells 0). Abdominal pain not consistent with ectopic, UTI/pyelonephritis, kidney stone, pancreatitis.  Given fluids and Zofran and Ativan. Labs unremarkable. EKG with no signs ischemia and troponin negative. Not consistent with ACS.  Likely a viral illness. Patient able to tolerate by mouth and has had no vomiting here. Stable for discharge home with follow-up to PCP.    Pt was seen under the supervision of Dr. Maryan Rued.     Sherian Maroon, MD 08/18/15 [id number]  Blanchie Dessert, MD 08/18/15 2133

## 2015-08-17 NOTE — ED Notes (Signed)
C/o nausea States she has been nausea with vomiting since last week States chest pain develops

## 2015-08-17 NOTE — ED Provider Notes (Signed)
HPI  SUBJECTIVE:  Yesenia Wheeler is a 45 y.o. female who presents with nausea and vomiting for one week. States that she is vomiting 3-4 times a day, unable to tolerate any solids. She is able tolerate small amounts of fluids. She denies abdominal pain, abdominal distention, change in urine output, diarrhea. She had a normal bowel movement this morning. She reports an episode of lightheadedness with going from lying to standing, but she did not pass out Earlier this week. She has been taking Phenergan and Zofran without any relief. Symptoms are worse with eating solid foods, no alleviating factors. She denies any sick contacts travel, raw or undercooked foods, questionable leftovers, change in medications, new foods.  She is also here today for 2 days of intermittent chest pain described as substernal pressure. It lasts several minutes and then resolves. She reports having an episode of chest pain followed by dizziness and vomiting today. She reports continued nausea that she has had for the past week. The chest pain does not radiate to her neck or to her arm. She denies diaphoresis, palpitations, presyncope, syncope. No coughing, wheezing, shortness of breath, hemoptysis, leg pain, calf swelling. There is no exertional positional component to her chest pain. She has never had chest pain like this before. She has never had a stress test or cardiac catheterization. Past medical history of DVT 4, is currently on 5 mg Coumadin, states that she is compliant with this. Also past history of asthma, migraines. She had atypical chest pain on 02/11/2015 but states that this pain is different. No history of diabetes, hypertension, hypercholesterolemia coronary artery disease, MI, arrhythmia, PE. She is an ex-smoker. Family history significant for grandmother with MI at 38.   Past Medical History  Diagnosis Date  . Asthma   . Migraines   . Bone spur 2008  . Anemia   . 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 Shoulder surgery Left 2008    Bone Spur removal  . Cholecystectomy    . Gastric bypass    . Knee surgery    . Cesarean section      Family History  Problem Relation Age of Onset  . Cancer Father     oral    Social History  Substance Use Topics  . Smoking status: Former Research scientist (life sciences)  . Smokeless tobacco: Never Used  . Alcohol Use: Yes     Comment: less than once a week-usually only holidays/special occasions    No current facility-administered medications for this encounter.  Current outpatient prescriptions:  .  acetaminophen (TYLENOL) 500 MG tablet, Take 1 tablet (500 mg total) by mouth every 6 (six) hours as needed., Disp: 30 tablet, Rfl: 0 .  albuterol (PROVENTIL HFA;VENTOLIN HFA) 108 (90 BASE) MCG/ACT inhaler, Inhale 2 puffs into the lungs every 6 (six) hours as needed for wheezing., Disp: 1 Inhaler, Rfl: 11 .  albuterol (PROVENTIL) (2.5 MG/3ML) 0.083% nebulizer solution, Take 3 mLs (2.5 mg total) by nebulization every 4 (four) hours as needed for wheezing or shortness of breath., Disp: 75 mL, Rfl: 11 .  fluconazole (DIFLUCAN) 150 MG tablet, Take 1 tablet (150 mg total) by mouth every 7 (seven) days., Disp: 2 tablet, Rfl: 0 .  hydrocortisone cream 1 %, Apply 1 application topically 2 (two) times daily., Disp: 30 g, Rfl: 2 .  hydrOXYzine (ATARAX/VISTARIL) 25  MG tablet, Take 1 tablet (25 mg total) by mouth every 6 (six) hours. (Patient not taking: Reported on 06/21/2015), Disp: 12 tablet, Rfl: 0 .  ketoconazole (NIZORAL) 2 % cream, Apply 1 application topically daily., Disp: 30 g, Rfl: 0 .  methocarbamol (ROBAXIN) 500 MG tablet, Take 1 tablet (500 mg total) by mouth 2 (two) times daily as needed for muscle spasms. (Patient not taking: Reported on 06/14/2015), Disp: 20 tablet, Rfl: 0  No Known Allergies   ROS  As noted in HPI.   Physical  Exam  BP 124/77 mmHg  Pulse 69  Temp(Src) 98.4 F (36.9 C) (Oral)  Resp 16  SpO2 100%  LMP 07/03/2015  Constitutional: Well developed, well nourished, no acute distress Eyes: PERRL, EOMI, conjunctiva normal bilaterally HENT: Normocephalic, atraumatic,mucus membranes moist Respiratory: Clear to auscultation bilaterally, no rales, no wheezing, no rhonchi Cardiovascular: Normal rate and rhythm, no murmurs, no gallops, no rubs GI: Soft, nondistended, normal bowel sounds, nontender, no rebound, no guarding Back: no CVAT skin: No rash, skin intact Musculoskeletal: No edema, no tenderness, no deformities Neurologic: Alert & oriented x 3, CN II-XII grossly intact, no motor deficits, sensation grossly intact Psychiatric: Speech and behavior appropriate   ED Course   Medications - No data to display  Orders Placed This Encounter  Procedures  . Pregnancy, urine POC    Standing Status: Standing     Number of Occurrences: 1     Standing Expiration Date:   . ED EKG    Standing Status: Standing     Number of Occurrences: 1     Standing Expiration Date:     Order Specific Question:  Reason for Exam    Answer:  Chest Pain  . EKG 12-Lead    Standing Status: Standing     Number of Occurrences: 1     Standing Expiration Date:    Results for orders placed or performed during the hospital encounter of 08/17/15 (from the past 24 hour(s))  Pregnancy, urine POC     Status: None   Collection Time: 08/17/15  3:56 PM  Result Value Ref Range   Preg Test, Ur NEGATIVE NEGATIVE   No results found.  ED Clinical Impression  Chest pain, unspecified chest pain type  Non-intractable vomiting with nausea, vomiting of unspecified type   ED Assessment/Plan EKG: Normal sinus rhythm, rate 70. Normal axis and normal intervals no hypertrophy. Isolated  Q in previous EKG, but isolated T wave inversion in 3. no ST  changes compared to EKG from 02/11/2015  Urine pregnancy negative.  Concern for  atypical presentation of ACS given the nausea and vomiting with no abdominal symptoms. Other concern for the episode of chest pain followed by lightheadedness and vomiting. Her EKG does not have any acute ischemic changes. Sending by EMS for ED evaluation.  Discussed labs,MDM, plan  for transfer with patient. Patient agrees with plan.   *This clinic note was created using Dragon dictation software. Therefore, there may be occasional mistakes despite careful proofreading.  ?  Melynda Ripple, MD 08/17/15 [phone]

## 2015-08-17 NOTE — ED Notes (Signed)
Pt sent from [facility] for n/v that began aprox. 10 days ago and intermittent substernal chest pressure that began two days ago.  Pt denies abdominal pain or diarrhea or associated symptoms of chest pain.

## 2015-08-17 NOTE — ED Notes (Signed)
Unsuccessful 1st IV attempt to left hand... Rmeza, CMA

## 2015-08-17 NOTE — Discharge Instructions (Signed)
Nonspecific Chest Pain  °Chest pain can be caused by many different conditions. There is always a chance that your pain could be related to something serious, such as a heart attack or a blood clot in your lungs. Chest pain can also be caused by conditions that are not life-threatening. If you have chest pain, it is very important to follow up with your health care provider. °CAUSES  °Chest pain can be caused by: °· Heartburn. °· Pneumonia or bronchitis. °· Anxiety or stress. °· Inflammation around your heart (pericarditis) or lung (pleuritis or pleurisy). °· A blood clot in your lung. °· A collapsed lung (pneumothorax). It can develop suddenly on its own (spontaneous pneumothorax) or from trauma to the chest. °· Shingles infection (varicella-zoster virus). °· Heart attack. °· Damage to the bones, muscles, and cartilage that make up your chest wall. This can include: °¨ Bruised bones due to injury. °¨ Strained muscles or cartilage due to frequent or repeated coughing or overwork. °¨ Fracture to one or more ribs. °¨ Sore cartilage due to inflammation (costochondritis). °RISK FACTORS  °Risk factors for chest pain may include: °· Activities that increase your risk for trauma or injury to your chest. °· Respiratory infections or conditions that cause frequent coughing. °· Medical conditions or overeating that can cause heartburn. °· Heart disease or family history of heart disease. °· Conditions or health behaviors that increase your risk of developing a blood clot. °· Having had chicken pox (varicella zoster). °SIGNS AND SYMPTOMS °Chest pain can feel like: °· Burning or tingling on the surface of your chest or deep in your chest. °· Crushing, pressure, aching, or squeezing pain. °· Dull or sharp pain that is worse when you move, cough, or take a deep breath. °· Pain that is also felt in your back, neck, shoulder, or arm, or pain that spreads to any of these areas. °Your chest pain may come and go, or it may stay  constant. °DIAGNOSIS °Lab tests or other studies may be needed to find the cause of your pain. Your health care provider may have you take a test called an ambulatory ECG (electrocardiogram). An ECG records your heartbeat patterns at the time the test is performed. You may also have other tests, such as: °· Transthoracic echocardiogram (TTE). During echocardiography, sound waves are used to create a picture of all of the heart structures and to look at how blood flows through your heart. °· Transesophageal echocardiogram (TEE). This is a more advanced imaging test that obtains images from inside your body. It allows your health care provider to see your heart in finer detail. °· Cardiac monitoring. This allows your health care provider to monitor your heart rate and rhythm in real time. °· Holter monitor. This is a portable device that records your heartbeat and can help to diagnose abnormal heartbeats. It allows your health care provider to track your heart activity for several days, if needed. °· Stress tests. These can be done through exercise or by taking medicine that makes your heart beat more quickly. °· Blood tests. °· Imaging tests. °TREATMENT  °Your treatment depends on what is causing your chest pain. Treatment may include: °· Medicines. These may include: °¨ Acid blockers for heartburn. °¨ Anti-inflammatory medicine. °¨ Pain medicine for inflammatory conditions. °¨ Antibiotic medicine, if an infection is present. °¨ Medicines to dissolve blood clots. °¨ Medicines to treat coronary artery disease. °· Supportive care for conditions that do not require medicines. This may include: °¨ Resting. °¨ Applying heat   or cold packs to injured areas. °¨ Limiting activities until pain decreases. °HOME CARE INSTRUCTIONS °· If you were prescribed an antibiotic medicine, finish it all even if you start to feel better. °· Avoid any activities that bring on chest pain. °· Do not use any tobacco products, including  cigarettes, chewing tobacco, or electronic cigarettes. If you need help quitting, ask your health care provider. °· Do not drink alcohol. °· Take medicines only as directed by your health care provider. °· Keep all follow-up visits as directed by your health care provider. This is important. This includes any further testing if your chest pain does not go away. °· If heartburn is the cause for your chest pain, you may be told to keep your head raised (elevated) while sleeping. This reduces the chance that acid will go from your stomach into your esophagus. °· Make lifestyle changes as directed by your health care provider. These may include: °¨ Getting regular exercise. Ask your health care provider to suggest some activities that are safe for you. °¨ Eating a heart-healthy diet. A registered dietitian can help you to learn healthy eating options. °¨ Maintaining a healthy weight. °¨ Managing diabetes, if necessary. °¨ Reducing stress. °SEEK MEDICAL CARE IF: °· Your chest pain does not go away after treatment. °· You have a rash with blisters on your chest. °· You have a fever. °SEEK IMMEDIATE MEDICAL CARE IF:  °· Your chest pain is worse. °· You have an increasing cough, or you cough up blood. °· You have severe abdominal pain. °· You have severe weakness. °· You faint. °· You have chills. °· You have sudden, unexplained chest discomfort. °· You have sudden, unexplained discomfort in your arms, back, neck, or jaw. °· You have shortness of breath at any time. °· You suddenly start to sweat, or your skin gets clammy. °· You feel nauseous or you vomit. °· You suddenly feel light-headed or dizzy. °· Your heart begins to beat quickly, or it feels like it is skipping beats. °These symptoms may represent a serious problem that is an emergency. Do not wait to see if the symptoms will go away. Get medical help right away. Call your local emergency services (911 in the [place] Do not drive yourself to the hospital. °  °This  information is not intended to replace advice given to you by your health care provider. Make sure you discuss any questions you have with your health care provider. °  °Document Released: 05/10/2005 Document Revised: 08/21/2014 Document Reviewed: 03/06/2014 °Elsevier Interactive Patient Education ©2016 [employer] ° °

## 2015-09-16 ENCOUNTER — Ambulatory Visit
Admission: RE | Admit: 2015-09-16 | Discharge: 2015-09-16 | Disposition: A | Discharge status: Home / Self Care | Payer: Commercial Managed Care - PPO | Source: Ambulatory Visit | Attending: Family Medicine | Admitting: Family Medicine

## 2015-09-16 ENCOUNTER — Other Ambulatory Visit: Payer: Self-pay | Admitting: Family Medicine

## 2015-09-16 DIAGNOSIS — G8929 Other chronic pain: Secondary | ICD-10-CM

## 2015-09-16 DIAGNOSIS — M545 Low back pain, unspecified: Secondary | ICD-10-CM

## 2016-02-08 ENCOUNTER — Emergency Department (HOSPITAL_COMMUNITY): Payer: Self-pay

## 2016-02-08 ENCOUNTER — Emergency Department (HOSPITAL_COMMUNITY)
Admission: EM | Admit: 2016-02-08 | Discharge: 2016-02-09 | Disposition: A | Discharge status: Home / Self Care | Payer: Self-pay | Attending: Emergency Medicine | Admitting: Emergency Medicine

## 2016-02-08 ENCOUNTER — Encounter (HOSPITAL_COMMUNITY): Payer: Self-pay

## 2016-02-08 DIAGNOSIS — Z87891 Personal history of nicotine dependence: Secondary | ICD-10-CM | POA: Insufficient documentation

## 2016-02-08 DIAGNOSIS — Z3202 Encounter for pregnancy test, result negative: Secondary | ICD-10-CM | POA: Insufficient documentation

## 2016-02-08 DIAGNOSIS — J45909 Unspecified asthma, uncomplicated: Secondary | ICD-10-CM | POA: Insufficient documentation

## 2016-02-08 DIAGNOSIS — R091 Pleurisy: Secondary | ICD-10-CM | POA: Insufficient documentation

## 2016-02-08 LAB — CBC
HCT: 36.5 % (ref 36.0–46.0)
Hemoglobin: 12.1 g/dL (ref 12.0–15.0)
MCH: 26.8 pg (ref 26.0–34.0)
MCHC: 33.2 g/dL (ref 30.0–36.0)
MCV: 80.8 fL (ref 78.0–100.0)
PLATELETS: 241 10*3/uL (ref 150–400)
RBC: 4.52 MIL/uL (ref 3.87–5.11)
RDW: 14.8 % (ref 11.5–15.5)
WBC: 10.1 10*3/uL (ref 4.0–10.5)

## 2016-02-08 LAB — DIFFERENTIAL
Basophils Absolute: 0 10*3/uL (ref 0.0–0.1)
Basophils Relative: 0 %
Eosinophils Absolute: 0.1 10*3/uL (ref 0.0–0.7)
Eosinophils Relative: 1 %
LYMPHS ABS: 1.1 10*3/uL (ref 0.7–4.0)
LYMPHS PCT: 11 %
MONO ABS: 0.3 10*3/uL (ref 0.1–1.0)
MONOS PCT: 3 %
NEUTROS ABS: 8.7 10*3/uL — AB (ref 1.7–7.7)
Neutrophils Relative %: 85 %

## 2016-02-08 LAB — I-STAT TROPONIN, ED: Troponin i, poc: 0 ng/mL (ref 0.00–0.08)

## 2016-02-08 LAB — D-DIMER, QUANTITATIVE: D-Dimer, Quant: 1.35 ug{FEU}/mL — ABNORMAL HIGH (ref 0.00–0.50)

## 2016-02-08 MED ORDER — KETOROLAC TROMETHAMINE 30 MG/ML IJ SOLN
30.0000 mg | Freq: Once | INTRAMUSCULAR | Status: DC
  Filled 2016-02-08: qty 1

## 2016-02-08 MED ORDER — SODIUM CHLORIDE 0.9 % IV SOLN
1000.0000 mL | Freq: Once | INTRAVENOUS | Status: AC
  Administered 2016-02-09: 1000 mL via INTRAVENOUS

## 2016-02-08 MED ORDER — ONDANSETRON HCL 4 MG/2ML IJ SOLN
4.0000 mg | Freq: Once | INTRAMUSCULAR | Status: DC
  Filled 2016-02-08: qty 2

## 2016-02-08 MED ORDER — SODIUM CHLORIDE 0.9 % IV SOLN
1000.0000 mL | INTRAVENOUS | Status: DC
  Administered 2016-02-09: 1000 mL via INTRAVENOUS

## 2016-02-08 NOTE — ED Notes (Signed)
Pt comes from home via [facility], this AM woke up with sore throat and body aches, n/v, now c/o CP under sternum, refused ASA and nitro but received 4mg  zofran PTA. Had similar symptoms about five years ago when she was pregnant.

## 2016-02-08 NOTE — ED Provider Notes (Signed)
CSN: [id number]     Arrival date & time 02/08/16  2220 History   First MD Initiated Contact with Patient 02/08/16 2252     Chief Complaint  Patient presents with  . Chest Pain     (Consider location/radiation/quality/duration/timing/severity/associated sxs/prior Treatment) Patient is a 45 y.o. female presenting with chest pain. The history is provided by a relative. The history is limited by the condition of the patient (Moaning and rocking in the bed and not verbal).  Chest Pain She apparently has been sick all day with of flulike illness and taken a variety of over-the-counter medications for that. She has had some diarrhea. About one hour prior to arrival in the ED, she started complaining of pain and pressure in her chest. She does endorse dyspnea, nausea, vomiting. She denies diaphoresis. She denies cough, fever, chills. She is not amenable to answering other questions currently.  Past Medical History  Diagnosis Date  . Asthma   . Migraines   . Bone spur 2008  . Anemia   . 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 Shoulder surgery Left 2008    Bone Spur removal  . Cholecystectomy    . Gastric bypass    . Knee surgery    . Cesarean section     Family History  Problem Relation Age of Onset  . Cancer Father     oral   Social History  Substance Use Topics  . Smoking status: Former Research scientist (life sciences)  . Smokeless tobacco: Never Used  . Alcohol Use: Yes     Comment: less than once a week-usually only holidays/special occasions   OB History    Gravida Para Term Preterm AB TAB SAB Ectopic Multiple Living   13    13  13   3      Review of Systems  Unable to perform ROS: Other  Cardiovascular: Positive for chest pain.      Allergies  Review of patient's allergies indicates no known allergies.  Home Medications   Prior to Admission medications   Medication Sig  Start Date End Date Taking? Authorizing Provider  acetaminophen (TYLENOL) 500 MG tablet Take 1 tablet (500 mg total) by mouth every 6 (six) hours as needed. Patient not taking: Reported on 08/17/2015 06/08/15   Waynetta Pean, PA-C  albuterol (PROVENTIL HFA;VENTOLIN HFA) 108 (90 BASE) MCG/ACT inhaler Inhale 2 puffs into the lungs every 6 (six) hours as needed for wheezing. 06/10/14   Thao P Le, DO  albuterol (PROVENTIL) (2.5 MG/3ML) 0.083% nebulizer solution Take 3 mLs (2.5 mg total) by nebulization every 4 (four) hours as needed for wheezing or shortness of breath. 06/10/14   Thao P Le, DO  fluconazole (DIFLUCAN) 150 MG tablet Take 1 tablet (150 mg total) by mouth every 7 (seven) days. Patient not taking: Reported on 08/17/2015 06/21/15   Boykin Nearing, MD  hydrocortisone cream 1 % Apply 1 application topically 2 (two) times daily. Patient not taking: Reported on 08/17/2015 06/17/15   Nona Dell, PA-C  hydrOXYzine (ATARAX/VISTARIL) 25 MG tablet Take 1 tablet (25 mg total) by mouth every 6 (six) hours. Patient not taking: Reported on 06/21/2015 06/17/15   Nona Dell, PA-C  ketoconazole (NIZORAL) 2 % cream Apply 1 application topically daily. Patient not taking: Reported on 08/17/2015 06/21/15   Boykin Nearing, MD  methocarbamol (ROBAXIN) 500 MG tablet Take  1 tablet (500 mg total) by mouth 2 (two) times daily as needed for muscle spasms. Patient not taking: Reported on 06/14/2015 06/08/15   Waynetta Pean, PA-C  ondansetron (ZOFRAN) 4 MG tablet Take 1 tablet (4 mg total) by mouth every 8 (eight) hours as needed for nausea or vomiting. 08/17/15   Sherian Maroon, MD   BP 128/100 mmHg  Pulse 76  Temp(Src) 98 F (36.7 C)  Resp 17  Ht 5\' 4"  (1.626 m)  Wt 220 lb (99.791 kg)  BMI 37.74 kg/m2  SpO2 100%  LMP 01/13/2016 Physical Exam  Nursing note and vitals reviewed.  45 year old female, moaning and rocking back and forth on the bed, but in no acute distress. Vital signs are  significant for diastolic hypertension. Oxygen saturation is 100%, which is normal. Head is normocephalic and atraumatic. PERRLA, EOMI. Oropharynx is clear. Neck is nontender and supple without adenopathy or JVD. Back is nontender and there is no CVA tenderness. Lungs are clear without rales, wheezes, or rhonchi. Chest is tender diffusely. Heart has regular rate and rhythm without murmur. Abdomen is soft, flat, nontender without masses or hepatosplenomegaly and peristalsis is normoactive. Extremities have no cyanosis or edema, full range of motion is present. Skin is warm and dry without rash. Neurologic: She is awake and alert but not talking to me, cranial nerves are intact, there are no motor or sensory deficits.  ED Course  Procedures (including critical care time) Labs Review Labs Reviewed  BASIC METABOLIC PANEL  CBC  DIFFERENTIAL  D-DIMER, QUANTITATIVE (NOT AT [facility])  Randolm Idol, ED    Imaging Review Dg Chest 2 View  02/08/2016  CLINICAL DATA:  Sore throat, body aches, nausea and vomiting, substernal chest pain. EXAM: CHEST  2 VIEW COMPARISON:  Chest radiograph August 17, 2015 FINDINGS: Cardiomediastinal silhouette is normal. The lungs are clear without pleural effusions or focal consolidations. Trachea projects midline and there is no pneumothorax. Soft tissue planes and included osseous structures are non-suspicious. Surgical clips in the included right abdomen compatible with cholecystectomy. IMPRESSION: No acute cardiopulmonary process. Electronically Signed   By: Elon Alas M.D.   On: 02/08/2016 22:54   I have personally reviewed and evaluated these images and lab results as part of my medical decision-making.   EKG Interpretation   Date/Time:  Tuesday February 08 2016 22:29:24 EDT Ventricular Rate:  74 PR Interval:    QRS Duration: 103 QT Interval:  359 QTC Calculation: 396 R Axis:   71 Text Interpretation:  Sinus or ectopic atrial rhythm Atrial premature   complex Short PR interval When compared with ECG of 08/17/2015, No  significant change was found Confirmed by [facility]  MD, Tyeasha Ebbs ([id number]) on  02/08/2016 10:52:12 PM      MDM   Final diagnoses:  Pleurisy    Chest discomfort which seems to be part of the a.m. flulike illness. Significant chest wall tenderness noted. Chest x-ray is unremarkable and ECG is unremarkable. Old records are reviewed and she has had prior ED visits with similar complaints. She does have history of DVT, so d-dimer will be obtained to make sure she does not have a pulmonary embolism. She'll be given IV fluids as well as ondansetron and ketorolac.  She had good but incomplete relief of pain with ketorolac. D-dimer is elevated, so she is sent for CT angiogram. CT angiogram shows no evidence of pulmonary emboli, but bronchial wall thickening and tree-in-bud infiltrates are noted which could be infectious. She likely has this is  part of a viral illness but will treat with antibiotics just encase it is bacterial. She is given a dose of dexamethasone and is sent home with prescriptions for naproxen, amoxicillin, and tramadol.  Delora Fuel, MD [id number]

## 2016-02-08 NOTE — ED Notes (Signed)
Patient transported to X-ray 

## 2016-02-09 ENCOUNTER — Emergency Department (HOSPITAL_COMMUNITY): Payer: Self-pay

## 2016-02-09 LAB — BASIC METABOLIC PANEL
Anion gap: 7 (ref 5–15)
CALCIUM: 9 mg/dL (ref 8.9–10.3)
CO2: 22 mmol/L (ref 22–32)
CREATININE: 1.04 mg/dL — AB (ref 0.44–1.00)
Chloride: 106 mmol/L (ref 101–111)
GFR calc non Af Amer: >60 mL/min (ref >60)
Glucose, Bld: 96 mg/dL (ref 65–99)
Potassium: 3.4 mmol/L — ABNORMAL LOW (ref 3.5–5.1)
SODIUM: 135 mmol/L (ref 135–145)

## 2016-02-09 LAB — I-STAT BETA HCG BLOOD, ED (MC, WL, AP ONLY)

## 2016-02-09 MED ORDER — AMOXICILLIN 500 MG PO CAPS: 1000.0000 mg | ORAL_CAPSULE | Freq: Two times a day (BID) | ORAL | Status: DC

## 2016-02-09 MED ORDER — KETOROLAC TROMETHAMINE 60 MG/2ML IM SOLN
60.0000 mg | Freq: Once | INTRAMUSCULAR | Status: AC
  Administered 2016-02-09: 60 mg via INTRAMUSCULAR
  Filled 2016-02-09: qty 2

## 2016-02-09 MED ORDER — ONDANSETRON 4 MG PO TBDP
8.0000 mg | ORAL_TABLET | Freq: Once | ORAL | Status: AC
  Administered 2016-02-09: 8 mg via ORAL
  Filled 2016-02-09: qty 2

## 2016-02-09 MED ORDER — NAPROXEN 500 MG PO TABS: 500.0000 mg | ORAL_TABLET | Freq: Two times a day (BID) | ORAL | Status: DC

## 2016-02-09 MED ORDER — FLUCONAZOLE 150 MG PO TABS: 150.0000 mg | ORAL_TABLET | Freq: Every day | ORAL | Status: DC

## 2016-02-09 MED ORDER — IOPAMIDOL (ISOVUE-370) INJECTION 76%
INTRAVENOUS | Status: AC
  Administered 2016-02-09: 100 mL
  Filled 2016-02-09: qty 100

## 2016-02-09 MED ORDER — AMOXICILLIN 500 MG PO CAPS
1000.0000 mg | ORAL_CAPSULE | Freq: Once | ORAL | Status: AC
  Administered 2016-02-09: 1000 mg via ORAL
  Filled 2016-02-09: qty 2

## 2016-02-09 MED ORDER — TRAMADOL HCL 50 MG PO TABS
50.0000 mg | ORAL_TABLET | Freq: Once | ORAL | Status: AC
  Administered 2016-02-09: 50 mg via ORAL
  Filled 2016-02-09: qty 1

## 2016-02-09 MED ORDER — DEXAMETHASONE SODIUM PHOSPHATE 10 MG/ML IJ SOLN
10.0000 mg | Freq: Once | INTRAMUSCULAR | Status: AC
  Administered 2016-02-09: 10 mg via INTRAVENOUS
  Filled 2016-02-09: qty 1

## 2016-02-09 MED ORDER — TRAMADOL HCL 50 MG PO TABS: 50.0000 mg | ORAL_TABLET | Freq: Four times a day (QID) | ORAL | Status: DC | PRN

## 2016-02-09 NOTE — ED Notes (Signed)
Yesenia Wheeler [phone] Pt husband  Arville Go [phone] Mother in law

## 2016-02-09 NOTE — ED Notes (Signed)
Patient transported to CT 

## 2016-02-09 NOTE — Discharge Instructions (Signed)
Pleurisy Pleurisy is an inflammation and swelling of the lining of the lungs (pleura). Because of this inflammation, it hurts to breathe. It can be aggravated by coughing, laughing, or deep breathing. Pleurisy is often caused by an underlying infection or disease.  HOME CARE INSTRUCTIONS  Monitor your pleurisy for any changes. The following actions may help to alleviate any discomfort you are experiencing:  Medicine may help with pain. Only take over-the-counter or prescription medicines for pain, discomfort, or fever as directed by your health care provider.  Only take antibiotic medicine as directed. Make sure to finish it even if you start to feel better. SEEK MEDICAL CARE IF:   Your pain is not controlled with medicine or is increasing.  You have an increase in pus-like (purulent) secretions brought up with coughing. SEEK IMMEDIATE MEDICAL CARE IF:   You have blue or dark lips, fingernails, or toenails.  You are coughing up blood.  You have increased difficulty breathing.  You have continuing pain unrelieved by medicine or pain lasting more than 1 week.  You have pain that radiates into your neck, arms, or jaw.  You develop increased shortness of breath or wheezing.  You develop a fever, rash, vomiting, fainting, or other serious symptoms. MAKE SURE YOU:  Understand these instructions.   Will watch your condition.   Will get help right away if you are not doing well or get worse.    This information is not intended to replace advice given to you by your health care provider. Make sure you discuss any questions you have with your health care provider.   Document Released: 07/31/2005 Document Revised: 04/02/2013 Document Reviewed: 01/12/2013 Elsevier Interactive Patient Education 2016 [employer]  Naproxen and naproxen sodium oral immediate-release tablets What is this medicine? NAPROXEN (na PROX en) is a non-steroidal anti-inflammatory drug (NSAID). It is used to  reduce swelling and to treat pain. This medicine may be used for dental pain, headache, or painful monthly periods. It is also used for painful joint and muscular problems such as arthritis, tendinitis, bursitis, and gout. This medicine may be used for other purposes; ask your health care provider or pharmacist if you have questions. What should I tell my health care provider before I take this medicine? They need to know if you have any of these conditions: -asthma -cigarette smoker -drink more than 3 alcohol containing drinks a day -heart disease or circulation problems such as heart failure or leg edema (fluid retention) -high blood pressure -kidney disease -liver disease -stomach bleeding or ulcers -an unusual or allergic reaction to naproxen, aspirin, other NSAIDs, other medicines, foods, dyes, or preservatives -pregnant or trying to get pregnant -breast-feeding How should I use this medicine? Take this medicine by mouth with a glass of water. Follow the directions on the prescription label. Take it with food if your stomach gets upset. Try to not lie down for at least 10 minutes after you take it. Take your medicine at regular intervals. Do not take your medicine more often than directed. Long-term, continuous use may increase the risk of heart attack or stroke. A special MedGuide will be given to you by the pharmacist with each prescription and refill. Be sure to read this information carefully each time. Talk to your pediatrician regarding the use of this medicine in children. Special care may be needed. Overdosage: If you think you have taken too much of this medicine contact a poison control center or emergency room at once. NOTE: This medicine is only  for you. Do not share this medicine with others. What if I miss a dose? If you miss a dose, take it as soon as you can. If it is almost time for your next dose, take only that dose. Do not take double or extra doses. What may interact  with this medicine? -alcohol -aspirin -cidofovir -diuretics -lithium -methotrexate -other drugs for inflammation like ketorolac or prednisone -pemetrexed -probenecid -warfarin This list may not describe all possible interactions. Give your health care provider a list of all the medicines, herbs, non-prescription drugs, or dietary supplements you use. Also tell them if you smoke, drink alcohol, or use illegal drugs. Some items may interact with your medicine. What should I watch for while using this medicine? Tell your doctor or health care professional if your pain does not get better. Talk to your doctor before taking another medicine for pain. Do not treat yourself. This medicine does not prevent heart attack or stroke. In fact, this medicine may increase the chance of a heart attack or stroke. The chance may increase with longer use of this medicine and in people who have heart disease. If you take aspirin to prevent heart attack or stroke, talk with your doctor or health care professional. Do not take other medicines that contain aspirin, ibuprofen, or naproxen with this medicine. Side effects such as stomach upset, nausea, or ulcers may be more likely to occur. Many medicines available without a prescription should not be taken with this medicine. This medicine can cause ulcers and bleeding in the stomach and intestines at any time during treatment. Do not smoke cigarettes or drink alcohol. These increase irritation to your stomach and can make it more susceptible to damage from this medicine. Ulcers and bleeding can happen without warning symptoms and can cause death. You may get drowsy or dizzy. Do not drive, use machinery, or do anything that needs mental alertness until you know how this medicine affects you. Do not stand or sit up quickly, especially if you are an older patient. This reduces the risk of dizzy or fainting spells. This medicine can cause you to bleed more easily. Try to  avoid damage to your teeth and gums when you brush or floss your teeth. What side effects may I notice from receiving this medicine? Side effects that you should report to your doctor or health care professional as soon as possible: -black or bloody stools, blood in the urine or vomit -blurred vision -chest pain -difficulty breathing or wheezing -nausea or vomiting -severe stomach pain -skin rash, skin redness, blistering or peeling skin, hives, or itching -slurred speech or weakness on one side of the body -swelling of eyelids, throat, lips -unexplained weight gain or swelling -unusually weak or tired -yellowing of eyes or skin Side effects that usually do not require medical attention (report to your doctor or health care professional if they continue or are bothersome): -constipation -headache -heartburn This list may not describe all possible side effects. Call your doctor for medical advice about side effects. You may report side effects to FDA at 1-800-FDA-1088. Where should I keep my medicine? Keep out of the reach of children. Store at room temperature between 15 and 30 degrees C (59 and 86 degrees F). Keep container tightly closed. Throw away any unused medicine after the expiration date. NOTE: This sheet is a summary. It may not cover all possible information. If you have questions about this medicine, talk to your doctor, pharmacist, or health care provider.    2016,  Elsevier/Gold Standard. (2009-08-02 20:10:16)  Amoxicillin capsules or tablets What is this medicine? AMOXICILLIN (a mox i SIL in) is a penicillin antibiotic. It is used to treat certain kinds of bacterial infections. It will not work for colds, flu, or other viral infections. This medicine may be used for other purposes; ask your health care provider or pharmacist if you have questions. What should I tell my health care provider before I take this medicine? They need to know if you have any of these  conditions: -asthma -kidney disease -an unusual or allergic reaction to amoxicillin, other penicillins, cephalosporin antibiotics, other medicines, foods, dyes, or preservatives -pregnant or trying to get pregnant -breast-feeding How should I use this medicine? Take this medicine by mouth with a glass of water. Follow the directions on your prescription label. You may take this medicine with food or on an empty stomach. Take your medicine at regular intervals. Do not take your medicine more often than directed. Take all of your medicine as directed even if you think your are better. Do not skip doses or stop your medicine early. Talk to your pediatrician regarding the use of this medicine in children. While this drug may be prescribed for selected conditions, precautions do apply. Overdosage: If you think you have taken too much of this medicine contact a poison control center or emergency room at once. NOTE: This medicine is only for you. Do not share this medicine with others. What if I miss a dose? If you miss a dose, take it as soon as you can. If it is almost time for your next dose, take only that dose. Do not take double or extra doses. What may interact with this medicine? -amiloride -birth control pills -chloramphenicol -macrolides -probenecid -sulfonamides -tetracyclines This list may not describe all possible interactions. Give your health care provider a list of all the medicines, herbs, non-prescription drugs, or dietary supplements you use. Also tell them if you smoke, drink alcohol, or use illegal drugs. Some items may interact with your medicine. What should I watch for while using this medicine? Tell your doctor or health care professional if your symptoms do not improve in 2 or 3 days. Take all of the doses of your medicine as directed. Do not skip doses or stop your medicine early. If you are diabetic, you may get a false positive result for sugar in your urine with certain  brands of urine tests. Check with your doctor. Do not treat diarrhea with over-the-counter products. Contact your doctor if you have diarrhea that lasts more than 2 days or if the diarrhea is severe and watery. What side effects may I notice from receiving this medicine? Side effects that you should report to your doctor or health care professional as soon as possible: -allergic reactions like skin rash, itching or hives, swelling of the face, lips, or tongue -breathing problems -dark urine -redness, blistering, peeling or loosening of the skin, including inside the mouth -seizures -severe or watery diarrhea -trouble passing urine or change in the amount of urine -unusual bleeding or bruising -unusually weak or tired -yellowing of the eyes or skin Side effects that usually do not require medical attention (report to your doctor or health care professional if they continue or are bothersome): -dizziness -headache -stomach upset -trouble sleeping This list may not describe all possible side effects. Call your doctor for medical advice about side effects. You may report side effects to FDA at 1-800-FDA-1088. Where should I keep my medicine? Keep out of  the reach of children. Store between 68 and 77 degrees F (20 and 25 degrees C). Keep bottle closed tightly. Throw away any unused medicine after the expiration date. NOTE: This sheet is a summary. It may not cover all possible information. If you have questions about this medicine, talk to your doctor, pharmacist, or health care provider.    2016, Elsevier/Gold Standard. (2007-10-22 14:10:59)  Tramadol tablets What is this medicine? TRAMADOL (TRA ma dole) is a pain reliever. It is used to treat moderate to severe pain in adults. This medicine may be used for other purposes; ask your health care provider or pharmacist if you have questions. What should I tell my health care provider before I take this medicine? They need to know if you  have any of these conditions: -brain tumor -depression -drug abuse or addiction -head injury -if you frequently drink alcohol containing drinks -kidney disease or trouble passing urine -liver disease -lung disease, asthma, or breathing problems -seizures or epilepsy -suicidal thoughts, plans, or attempt; a previous suicide attempt by you or a family member -an unusual or allergic reaction to tramadol, codeine, other medicines, foods, dyes, or preservatives -pregnant or trying to get pregnant -breast-feeding How should I use this medicine? Take this medicine by mouth with a full glass of water. Follow the directions on the prescription label. If the medicine upsets your stomach, take it with food or milk. Do not take more medicine than you are told to take. Talk to your pediatrician regarding the use of this medicine in children. Special care may be needed. Overdosage: If you think you have taken too much of this medicine contact a poison control center or emergency room at once. NOTE: This medicine is only for you. Do not share this medicine with others. What if I miss a dose? If you miss a dose, take it as soon as you can. If it is almost time for your next dose, take only that dose. Do not take double or extra doses. What may interact with this medicine? Do not take this medicine with any of the following medications: -MAOIs like Carbex, Eldepryl, Marplan, Nardil, and Parnate This medicine may also interact with the following medications: -alcohol or medicines that contain alcohol -antihistamines -benzodiazepines -bupropion -carbamazepine or oxcarbazepine -clozapine -cyclobenzaprine -digoxin -furazolidone -linezolid -medicines for depression, anxiety, or psychotic disturbances -medicines for migraine headache like almotriptan, eletriptan, frovatriptan, naratriptan, rizatriptan, sumatriptan, zolmitriptan -medicines for pain like pentazocine, buprenorphine, butorphanol,  meperidine, nalbuphine, and propoxyphene -medicines for sleep -muscle relaxants -naltrexone -phenobarbital -phenothiazines like perphenazine, thioridazine, chlorpromazine, mesoridazine, fluphenazine, prochlorperazine, promazine, and trifluoperazine -procarbazine -warfarin This list may not describe all possible interactions. Give your health care provider a list of all the medicines, herbs, non-prescription drugs, or dietary supplements you use. Also tell them if you smoke, drink alcohol, or use illegal drugs. Some items may interact with your medicine. What should I watch for while using this medicine? Tell your doctor or health care professional if your pain does not go away, if it gets worse, or if you have new or a different type of pain. You may develop tolerance to the medicine. Tolerance means that you will need a higher dose of the medicine for pain relief. Tolerance is normal and is expected if you take this medicine for a long time. Do not suddenly stop taking your medicine because you may develop a severe reaction. Your body becomes used to the medicine. This does NOT mean you are addicted. Addiction is a behavior related to  getting and using a drug for a non-medical reason. If you have pain, you have a medical reason to take pain medicine. Your doctor will tell you how much medicine to take. If your doctor wants you to stop the medicine, the dose will be slowly lowered over time to avoid any side effects. You may get drowsy or dizzy. Do not drive, use machinery, or do anything that needs mental alertness until you know how this medicine affects you. Do not stand or sit up quickly, especially if you are an older patient. This reduces the risk of dizzy or fainting spells. Alcohol can increase or decrease the effects of this medicine. Avoid alcoholic drinks. You may have constipation. Try to have a bowel movement at least every 2 to 3 days. If you do not have a bowel movement for 3 days, call  your doctor or health care professional. Your mouth may get dry. Chewing sugarless gum or sucking hard candy, and drinking plenty of water may help. Contact your doctor if the problem does not go away or is severe. What side effects may I notice from receiving this medicine? Side effects that you should report to your doctor or health care professional as soon as possible: -allergic reactions like skin rash, itching or hives, swelling of the face, lips, or tongue -breathing difficulties, wheezing -confusion -itching -light headedness or fainting spells -redness, blistering, peeling or loosening of the skin, including inside the mouth -seizures Side effects that usually do not require medical attention (report to your doctor or health care professional if they continue or are bothersome): -constipation -dizziness -drowsiness -headache -nausea, vomiting This list may not describe all possible side effects. Call your doctor for medical advice about side effects. You may report side effects to FDA at 1-800-FDA-1088. Where should I keep my medicine? Keep out of the reach of children. This medicine may cause accidental overdose and death if it taken by other adults, children, or pets. Mix any unused medicine with a substance like cat litter or coffee grounds. Then throw the medicine away in a sealed container like a sealed bag or a coffee can with a lid. Do not use the medicine after the expiration date. Store at room temperature between 15 and 30 degrees C (59 and 86 degrees F). NOTE: This sheet is a summary. It may not cover all possible information. If you have questions about this medicine, talk to your doctor, pharmacist, or health care provider.    2016, Elsevier/Gold Standard. (2013-09-26 15:42:09)

## 2016-02-14 ENCOUNTER — Encounter (HOSPITAL_COMMUNITY): Payer: Self-pay | Admitting: Emergency Medicine

## 2016-02-14 ENCOUNTER — Emergency Department (HOSPITAL_COMMUNITY)
Admission: EM | Admit: 2016-02-14 | Discharge: 2016-02-14 | Disposition: A | Discharge status: Home / Self Care | Payer: BLUE CROSS/BLUE SHIELD | Attending: Emergency Medicine | Admitting: Emergency Medicine

## 2016-02-14 DIAGNOSIS — R110 Nausea: Secondary | ICD-10-CM | POA: Diagnosis not present

## 2016-02-14 DIAGNOSIS — Z87891 Personal history of nicotine dependence: Secondary | ICD-10-CM | POA: Diagnosis not present

## 2016-02-14 DIAGNOSIS — R0602 Shortness of breath: Secondary | ICD-10-CM | POA: Diagnosis not present

## 2016-02-14 DIAGNOSIS — J45909 Unspecified asthma, uncomplicated: Secondary | ICD-10-CM | POA: Insufficient documentation

## 2016-02-14 DIAGNOSIS — R0789 Other chest pain: Secondary | ICD-10-CM | POA: Diagnosis present

## 2016-02-14 DIAGNOSIS — Z86718 Personal history of other venous thrombosis and embolism: Secondary | ICD-10-CM | POA: Diagnosis not present

## 2016-02-14 DIAGNOSIS — Z79899 Other long term (current) drug therapy: Secondary | ICD-10-CM | POA: Diagnosis not present

## 2016-02-14 DIAGNOSIS — R079 Chest pain, unspecified: Secondary | ICD-10-CM

## 2016-02-14 MED ORDER — DEXAMETHASONE SODIUM PHOSPHATE 10 MG/ML IJ SOLN
10.0000 mg | Freq: Once | INTRAMUSCULAR | Status: AC
  Administered 2016-02-14: 10 mg via INTRAMUSCULAR

## 2016-02-14 MED ORDER — DEXAMETHASONE SODIUM PHOSPHATE 10 MG/ML IJ SOLN
10.0000 mg | Freq: Once | INTRAMUSCULAR | Status: DC
  Filled 2016-02-14: qty 1

## 2016-02-14 MED ORDER — KETOROLAC TROMETHAMINE 30 MG/ML IJ SOLN
30.0000 mg | Freq: Once | INTRAMUSCULAR | Status: AC
  Administered 2016-02-14: 30 mg via INTRAMUSCULAR

## 2016-02-14 MED ORDER — KETOROLAC TROMETHAMINE 30 MG/ML IJ SOLN
15.0000 mg | Freq: Once | INTRAMUSCULAR | Status: DC
  Filled 2016-02-14: qty 1

## 2016-02-14 NOTE — Discharge Instructions (Signed)
Continue taking your prescriptions of naproxen and tramadol as prescribed for pain relief. I also recommend continuing to take your antibiotics until you have finished the prescription. Please follow up with a primary care provider from the Resource Guide provided below in 1 week for follow up evaluation. Please return to the Emergency Department if symptoms worsen or new onset of fever, headache, worsening/chest pain, difficulty breathing, productive cough, wheezing, lightheadedness, dizziness, syncope, leg swelling.

## 2016-02-14 NOTE — ED Provider Notes (Signed)
CSN: [id number]     Arrival date & time 02/14/16  1130 History   First MD Initiated Contact with Patient 02/14/16 1223     Chief Complaint  Patient presents with  . Chest Pain     (Consider location/radiation/quality/duration/timing/severity/associated sxs/prior Treatment) HPI   Patient is a 45 year old female with past medical history of asthma, DVT and anemia who presents the ED with complaint of ongoing chest pain and shortness of breath, onset one week. Patient reports having constant mid sternal chest pressure which she notes is worse with standing, ambulating, bending or taking a deep breath. She endorses associated intermittent shortness of breath which she states typically occurs with ambulating. Endorses associated intermittent nausea and wheezing. Denies fever, chills, diaphoresis, headache, nasal congestion, rhinorrhea, sore throat, cough, abdominal pain, vomiting, diarrhea, urinary symptoms, leg swelling. Denies personal or family hx of cardiac disease. She notes she was seen in the ED on 6/28 for her sxs, was dx with pleursy and sent home with antibiotics and pain meds. She notes she has been taking her amoxicillin as prescribed but states she has only taken one dose of naproxen over the week. She notes her CP and SOB have mildly improved but remains constant. Denies any recent surgery or hospitalization, leg swelling, use of exogenous estrogen.   Past Medical History  Diagnosis Date  . Asthma   . Migraines   . Bone spur 2008  . Anemia   . 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 Shoulder surgery Left 2008    Bone Spur removal  . Cholecystectomy    . Gastric bypass    . Knee surgery    . Cesarean section     Family History  Problem Relation Age of Onset  . Cancer Father     oral   Social History  Substance Use Topics  . Smoking status: Former Research scientist (life sciences)   . Smokeless tobacco: Never Used  . Alcohol Use: Yes     Comment: less than once a week-usually only holidays/special occasions   OB History    Gravida Para Term Preterm AB TAB SAB Ectopic Multiple Living   13    13  13   3      Review of Systems  Respiratory: Positive for shortness of breath and wheezing.   Cardiovascular: Positive for chest pain.  Gastrointestinal: Positive for nausea.  All other systems reviewed and are negative.     Allergies  Review of patient's allergies indicates no known allergies.  Home Medications   Prior to Admission medications   Medication Sig Start Date End Date Taking? Authorizing Provider  albuterol (PROVENTIL HFA;VENTOLIN HFA) 108 (90 BASE) MCG/ACT inhaler Inhale 2 puffs into the lungs every 6 (six) hours as needed for wheezing. 06/10/14  Yes Thao P Le, DO  albuterol (PROVENTIL) (2.5 MG/3ML) 0.083% nebulizer solution Take 3 mLs (2.5 mg total) by nebulization every 4 (four) hours as needed for wheezing or shortness of breath. 06/10/14  Yes Thao P Le, DO  amoxicillin (AMOXIL) 500 MG capsule Take 2 capsules (1,000 mg total) by mouth 2 (two) times daily. [id number]  Yes Delora Fuel, MD  fluconazole (DIFLUCAN) 150 MG tablet Take 1 tablet (150 mg total) by mouth daily. [id number]  Yes Delora Fuel, MD  ondansetron (ZOFRAN) 4 MG tablet Take 1 tablet (4 mg total) by mouth every 8 (eight) hours  as needed for nausea or vomiting. 08/17/15  Yes Sherian Maroon, MD  naproxen (NAPROSYN) 500 MG tablet Take 1 tablet (500 mg total) by mouth 2 (two) times daily. Patient not taking: Reported on 02/14/2016 [id number]   Delora Fuel, MD  traMADol (ULTRAM) 50 MG tablet Take 1 tablet (50 mg total) by mouth every 6 (six) hours as needed. Patient not taking: Reported on 02/14/2016 [id number]   Delora Fuel, MD   BP [id number] mmHg  Pulse 70  Temp(Src) 98.6 F (37 C) (Oral)  Resp 14  SpO2 100%  LMP 01/13/2016 Physical Exam  Constitutional: She is oriented to person, place, and time. She appears  well-developed and well-nourished. No distress.  HENT:  Head: Normocephalic and atraumatic.  Nose: Nose normal. Right sinus exhibits no maxillary sinus tenderness and no frontal sinus tenderness. Left sinus exhibits no maxillary sinus tenderness and no frontal sinus tenderness.  Mouth/Throat: Uvula is midline, oropharynx is clear and moist and mucous membranes are normal. No oropharyngeal exudate, posterior oropharyngeal edema, posterior oropharyngeal erythema or tonsillar abscesses.  Eyes: Conjunctivae and EOM are normal. Pupils are equal, round, and reactive to light. Right eye exhibits no discharge. Left eye exhibits no discharge. No scleral icterus.  Neck: Normal range of motion. Neck supple.  Cardiovascular: Normal rate, regular rhythm, normal heart sounds and intact distal pulses.   Pulmonary/Chest: Effort normal and breath sounds normal. No respiratory distress. She has no wheezes. She has no rales. She exhibits tenderness (anterior chest wall tender with light palpation).  Abdominal: Soft. Bowel sounds are normal. She exhibits no distension and no mass. There is no tenderness. There is no rebound and no guarding.  Musculoskeletal: Normal range of motion. She exhibits no edema.  Lymphadenopathy:    She has no cervical adenopathy.  Neurological: She is alert and oriented to person, place, and time.  Skin: Skin is warm and dry. She is not diaphoretic.  Nursing note and vitals reviewed.   ED Course  Procedures (including critical care time) Labs Review Labs Reviewed - No data to display  Imaging Review No results found. I have personally reviewed and evaluated these images and lab results as part of my medical decision-making.   EKG Interpretation   Date/Time:  Monday February 14 2016 11:36:37 EDT Ventricular Rate:  79 PR Interval:    QRS Duration: 84 QT Interval:  360 QTC Calculation: 413 R Axis:   43 Text Interpretation:  Sinus rhythm Ventricular premature complex Low   voltage, precordial leads Confirmed by ALLEN  MD, ANTHONY ([id number]) on  02/14/2016 12:47:26 PM      MDM   Final diagnoses:  Chest pain, unspecified chest pain type  SOB (shortness of breath)    Patient presents with chest pain, shortness of breath and wheezing that can present for the past week. She was seen in the ED on 6/28 for similar symptoms, diagnosed with pleurisy and sent home with amoxicillin and symptomatically treatment. Patient endorses taking amoxicillin as prescribed but denies taking any pain meds due to "not liking to take pain meds".  Pt reports symptoms have mildly and improved since initial evaluation. VSS. Exam revealed mild tenderness over anterior chest wall, lungs clear auscultation bilaterally. Remaining exam unremarkable. Patient given steroids and Toradol in the ED.  Chart review shows patient was seen in the ED on 02/09/16 for similar chest pain. Labs, chest x-ray and EKG were unremarkable. D-dimer obtained due to history of DVT. D-dimer elevated, CT angios showed no evidence of PE but bronchial wall  thickening and infiltrates noted which could be infectious resulting the patient started on antibiotics.  On reevaluation patient reports her symptoms have improved. I have a low suspicion for ACS, PE, dissection, or other acute cardiac event at this time and do not feel any further workup or imaging is warranted. Suspect patient's CP is likely due to pleurisy. Discussed results and plan for discharge with patient. Plan to discharge patient home with symptomatic treatment. Advised patient to continue taking her prescription of amoxicillin as prescribed and to start taking her prescriptions of naproxen and tramadol as prescribed for pain relief. Patient given information to follow-up with PCP outpatient. Discussed return precautions with patient.    Chesley Noon [place], [place] 02/14/16 1412  Lacretia Leigh, MD 02/14/16 [phone]

## 2016-02-14 NOTE — ED Notes (Addendum)
Pt reports ongoing CP and SOB since being seen at [facility] several days ago for pleuresy. Prescriptions not helping.

## 2016-02-14 NOTE — ED Notes (Signed)
Unsuccessful IV attempt to left AC. Pt reports is difficult IV stick. [facility] primary nurse notifying Modena Jansky to acquire possible change in medication route.

## 2016-03-23 ENCOUNTER — Emergency Department (HOSPITAL_COMMUNITY)
Admission: EM | Admit: 2016-03-23 | Discharge: 2016-03-23 | Disposition: A | Discharge status: Home / Self Care | Payer: Self-pay | Attending: Emergency Medicine | Admitting: Emergency Medicine

## 2016-03-23 ENCOUNTER — Emergency Department (HOSPITAL_COMMUNITY): Payer: Self-pay

## 2016-03-23 ENCOUNTER — Encounter (HOSPITAL_COMMUNITY): Payer: Self-pay | Admitting: Emergency Medicine

## 2016-03-23 DIAGNOSIS — M25561 Pain in right knee: Secondary | ICD-10-CM | POA: Insufficient documentation

## 2016-03-23 DIAGNOSIS — J45909 Unspecified asthma, uncomplicated: Secondary | ICD-10-CM | POA: Insufficient documentation

## 2016-03-23 DIAGNOSIS — Z87891 Personal history of nicotine dependence: Secondary | ICD-10-CM | POA: Insufficient documentation

## 2016-03-23 MED ORDER — IBUPROFEN 800 MG PO TABS: 800.0000 mg | ORAL_TABLET | Freq: Three times a day (TID) | ORAL | 0 refills | Status: DC

## 2016-03-23 MED ORDER — HYDROCODONE-ACETAMINOPHEN 5-325 MG PO TABS
1.0000 | ORAL_TABLET | Freq: Once | ORAL | Status: AC
  Administered 2016-03-23: 1 via ORAL
  Filled 2016-03-23: qty 1

## 2016-03-23 MED ORDER — IBUPROFEN 200 MG PO TABS
600.0000 mg | ORAL_TABLET | Freq: Once | ORAL | Status: AC
  Administered 2016-03-23: 600 mg via ORAL
  Filled 2016-03-23: qty 3

## 2016-03-23 MED ORDER — OXYCODONE-ACETAMINOPHEN 5-325 MG PO TABS: 1.0000 | ORAL_TABLET | ORAL | 0 refills | Status: DC | PRN

## 2016-03-23 NOTE — ED Triage Notes (Signed)
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 States she sat down for a few minutes to rest, and when she got up again, the knee buckled causing her to fall. Pt denies recent injury or trauma to same, though has had some chronic issues to same. Pt denies head strike, LOC with fall.

## 2016-03-23 NOTE — Progress Notes (Signed)
Orthopedic Tech Progress Note Patient Details:  Rhenda Mcnelis [facility] 1971-08-07 [id number]  Ortho Devices Type of Ortho Device: Crutches, Knee Immobilizer Ortho Device/Splint Location: rle Ortho Device/Splint Interventions: Application   Samael Blades 03/23/2016, 8:52 AM

## 2016-03-23 NOTE — ED Notes (Signed)
Gave pt ice pack for R knee.

## 2016-03-23 NOTE — ED Provider Notes (Signed)
[facility] DEPT Provider Note   CSN: [id number] Arrival date & time: 03/23/16  [id number]  First Provider Contact:  First MD Initiated Contact with Patient 03/23/16 0700    History   Chief Complaint Chief Complaint  Patient presents with  . Knee Pain    HPI   Yesenia Wheeler is an 45 y.o. female with history of right meniscal injury, arthritis, bone spur, DVT who presents to the ED for evaluation of right knee pain. 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 She states the pain progressively got worse and at one point she sat down to rest and as she was standing her knee buckled. She states the pain was then significantly worse and her knee has become slightly swolllen. She states she is able to bear weight but it is painful. She denies new numbness, weakness, or tingling. She denies radiation of the pain or swelling. She states she has had many issues with her knee before including requiring a surgery on her meniscus and this pain feels similar. She has not taken anything for pain. She denies other injury or trauma. She no longer follows with ortho.   Past Medical History:  Diagnosis Date  . Anemia   . Asthma   . Bone spur 2008  . 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 Migraines     Patient Active Problem List   Diagnosis Date Noted  . BV (bacterial vaginosis) 06/23/2015  . Tinea pedis 06/21/2015  . Screening-pulmonary TB 03/15/2015  . Obesity (BMI 30-39.9) 05/26/2014  . Asthma, chronic 05/25/2014  . Migraine 05/25/2014  . Chronic back pain 05/25/2014    Past Surgical History:  Procedure Laterality Date  . CESAREAN SECTION    . CHOLECYSTECTOMY    . GASTRIC BYPASS    . KNEE SURGERY    . SHOULDER SURGERY Left 2008   Bone Spur removal    OB History    Gravida Para Term Preterm AB Living   13       13 3    SAB TAB Ectopic Multiple Live  Births   13               Home Medications    Prior to Admission medications   Medication Sig Start Date End Date Taking? Authorizing Provider  phentermine 37.5 MG capsule Take 37.5 mg by mouth daily.   Yes Historical Provider, MD  albuterol (PROVENTIL HFA;VENTOLIN HFA) 108 (90 BASE) MCG/ACT inhaler Inhale 2 puffs into the lungs every 6 (six) hours as needed for wheezing. Patient not taking: Reported on 03/23/2016 06/10/14   Thao P Le, DO  albuterol (PROVENTIL) (2.5 MG/3ML) 0.083% nebulizer solution Take 3 mLs (2.5 mg total) by nebulization every 4 (four) hours as needed for wheezing or shortness of breath. Patient not taking: Reported on 03/23/2016 06/10/14   Thao P Le, DO  amoxicillin (AMOXIL) 500 MG capsule Take 2 capsules (1,000 mg total) by mouth 2 (two) times daily. Patient not taking: Reported on 03/23/2016 [id number]   Delora Fuel, MD  fluconazole (DIFLUCAN) 150 MG tablet Take 1 tablet (150 mg total) by mouth daily. Patient not taking: Reported on 03/23/2016 [id number]   Delora Fuel, MD  naproxen (NAPROSYN) 500 MG tablet Take 1 tablet (500 mg total) by mouth 2 (two) times daily. Patient not taking: Reported on 02/14/2016 02/09/16   Shanon Brow  Roxanne Mins, MD  ondansetron (ZOFRAN) 4 MG tablet Take 1 tablet (4 mg total) by mouth every 8 (eight) hours as needed for nausea or vomiting. Patient not taking: Reported on 03/23/2016 08/17/15   Sherian Maroon, MD  traMADol (ULTRAM) 50 MG tablet Take 1 tablet (50 mg total) by mouth every 6 (six) hours as needed. Patient not taking: Reported on 02/14/2016 [id number]   Delora Fuel, MD    Family History Family History  Problem Relation Age of Onset  . Cancer Father     oral    Social History Social History  Substance Use Topics  . Smoking status: Former Research scientist (life sciences)  . Smokeless tobacco: Never Used  . Alcohol use Yes     Comment: less than once a week-usually only holidays/special occasions     Allergies   Review of patient's allergies indicates no known  allergies.   Review of Systems Review of Systems 10 Systems reviewed and are negative for acute change except as noted in the HPI.   Physical Exam Updated Vital Signs BP 108/87   Pulse 75   Temp 98 F (36.7 C) (Oral)   Resp 19   LMP 03/03/2016 (Exact Date)   SpO2 100%   Physical Exam  Constitutional: She is oriented to person, place, and time. No distress.  HENT:  Head: Atraumatic.  Right Ear: External ear normal.  Left Ear: External ear normal.  Nose: Nose normal.  Eyes: Conjunctivae are normal. No scleral icterus.  Cardiovascular: Normal rate and regular rhythm.   Pulmonary/Chest: Effort normal. No respiratory distress.  Abdominal: She exhibits no distension.  Musculoskeletal:  L knee unremarkable R knee diffusely edematous and tender, particularly anterior knee. Limited ROM due to pain. No calf tenderness or edema. 2+ DP and TP  Neurological: She is alert and oriented to person, place, and time.  Skin: Skin is warm and dry. She is not diaphoretic.  Psychiatric: She has a normal mood and affect. Her behavior is normal.  Nursing note and vitals reviewed.    ED Treatments / Results  Labs (all labs ordered are listed, but only abnormal results are displayed) Labs Reviewed - No data to display  EKG  EKG Interpretation None       Radiology Dg Knee Complete 4 Views Right  Result Date: 03/23/2016 CLINICAL DATA:  Right knee pain since the knee "gave out" yesterday. EXAM: RIGHT KNEE - COMPLETE 4+ VIEW COMPARISON:  Radiographs dated 02/07/2015 and 08/16/2014 FINDINGS: New small effusion. New small marginal osteophytes in the lateral compartment. Small patellofemoral and medial compartment osteophytes are unchanged. Chronic calcification in the distal patellar tendon. IMPRESSION: Slight progression of osteoarthritis.  Small joint effusion Electronically Signed   By: Lorriane Shire M.D.   On: 03/23/2016 07:59    Procedures Procedures (including critical care  time)  Medications Ordered in ED Medications  HYDROcodone-acetaminophen (NORCO/VICODIN) 5-325 MG per tablet 1 tablet (1 tablet Oral Given 03/23/16 0730)  ibuprofen (ADVIL,MOTRIN) tablet 600 mg (600 mg Oral Given 03/23/16 0730)     Initial Impression / Assessment and Plan / ED Course  I have reviewed the triage vital signs and the nursing notes.  Pertinent labs & imaging results that were available during my care of the patient were reviewed by me and considered in my medical decision making (see chart for details).  Clinical Course    X-ray reveals some progression of her known osteoarthritis and a small associated joint effusion. Her pain is improved in the ED. She is otherwise neurovascualrly intact.  On repeat exam after pain medication she has a negative lachman test. No laxity. She does have lateral and medial pain with valgus and varus stress. We will provide a knee immobilizer and crutches in the ED. rx given for supportive meds. Encouraged RICE therapy and work note given. Ultimately I discussed with pt that she should have close follow up with ortho. ER return precautions given.  Final Clinical Impressions(s) / ED Diagnoses   Final diagnoses:  Knee pain, right    New Prescriptions Discharge Medication List as of 03/23/2016  8:59 AM    START taking these medications   Details  ibuprofen (ADVIL,MOTRIN) 800 MG tablet Take 1 tablet (800 mg total) by mouth 3 (three) times daily., Starting Thu 03/23/2016, Print    oxyCODONE-acetaminophen (PERCOCET/ROXICET) 5-325 MG tablet Take 1 tablet by mouth every 4 (four) hours as needed for severe pain., Starting Thu 03/23/2016, Print         Anne Ng, PA-C 03/23/16 2022    Margette Fast, MD 03/24/16 [phone]

## 2016-04-04 ENCOUNTER — Other Ambulatory Visit: Payer: Self-pay | Admitting: Family Medicine

## 2016-04-04 DIAGNOSIS — M25561 Pain in right knee: Secondary | ICD-10-CM

## 2016-04-08 ENCOUNTER — Ambulatory Visit
Admission: RE | Admit: 2016-04-08 | Discharge: 2016-04-08 | Disposition: A | Discharge status: Home / Self Care | Payer: BLUE CROSS/BLUE SHIELD | Source: Ambulatory Visit | Attending: Family Medicine | Admitting: Family Medicine

## 2016-04-08 DIAGNOSIS — M25561 Pain in right knee: Secondary | ICD-10-CM

## 2016-05-23 IMAGING — CR DG LUMBAR SPINE COMPLETE 4+V
5 series · 5 of 5 positions shown · non-contrast
Comparison: 12/12/2014

CLINICAL DATA: Patient c/o mid/low back pain, non radiating; x
years, but has become worse over the past couple months; nki

EXAM:
LUMBAR SPINE - COMPLETE 4+ VIEW

[t l-spine a.p.]
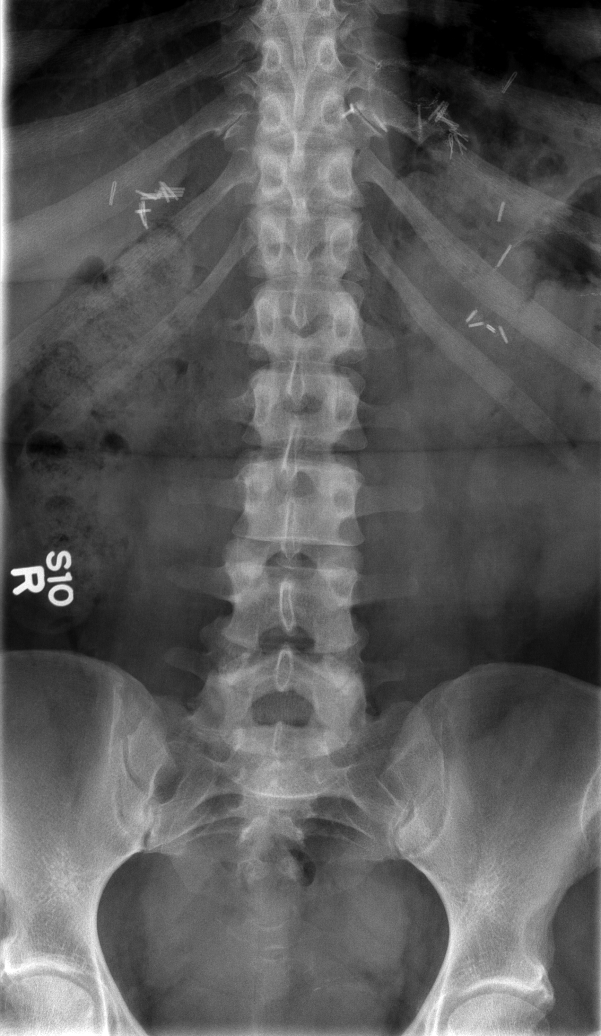

[t l-spine oblique exposure (1 of 2)]
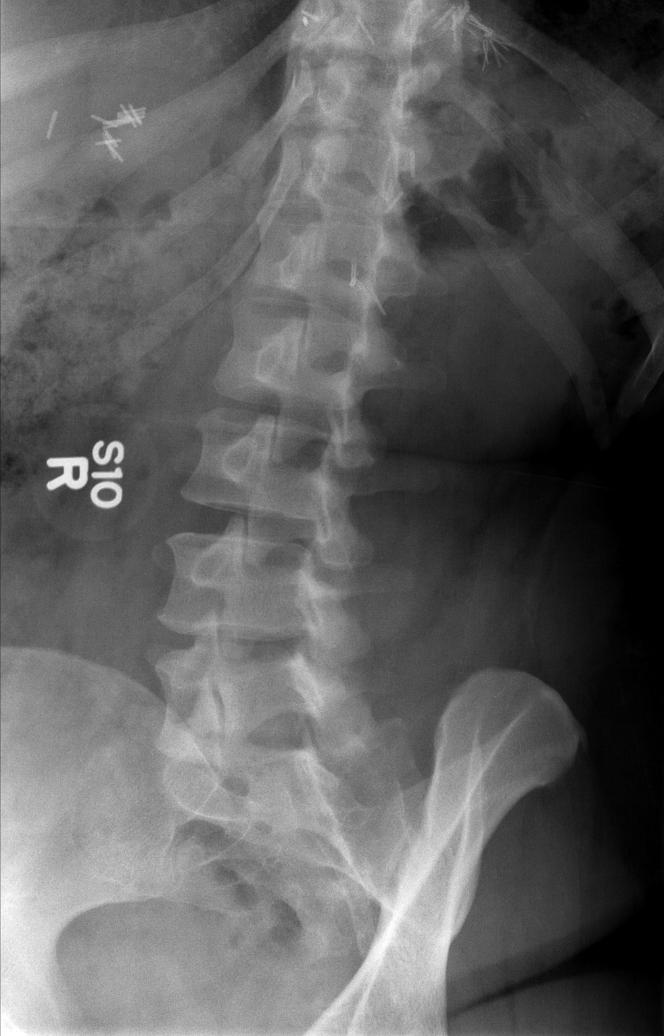

[t l-spine oblique exposure (2 of 2)]
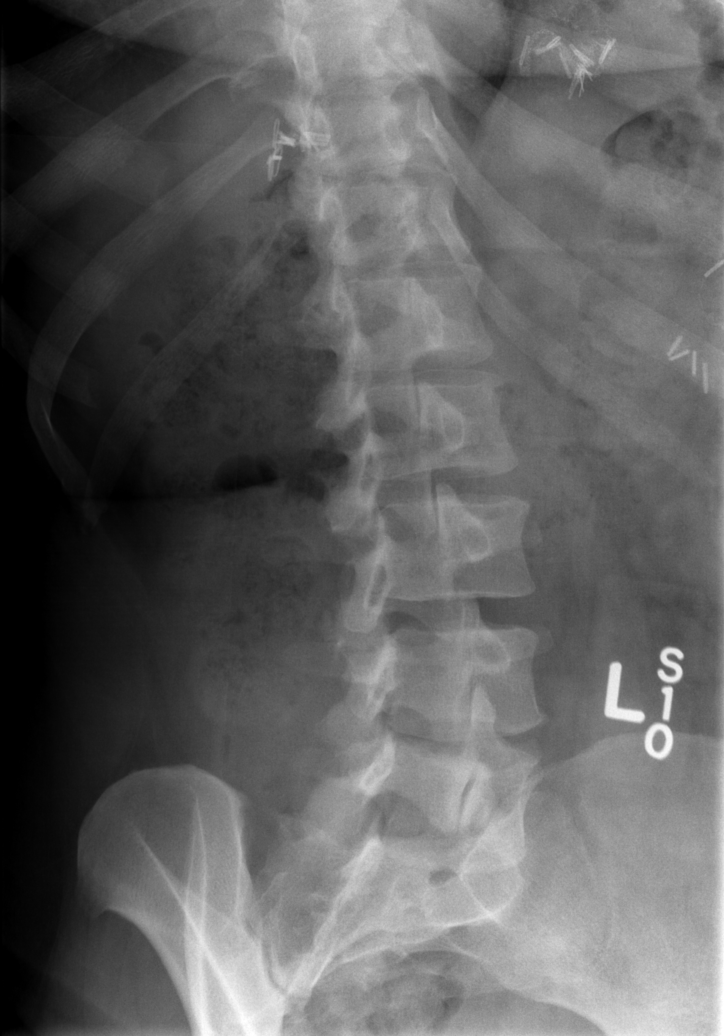

[t l-spine lat]
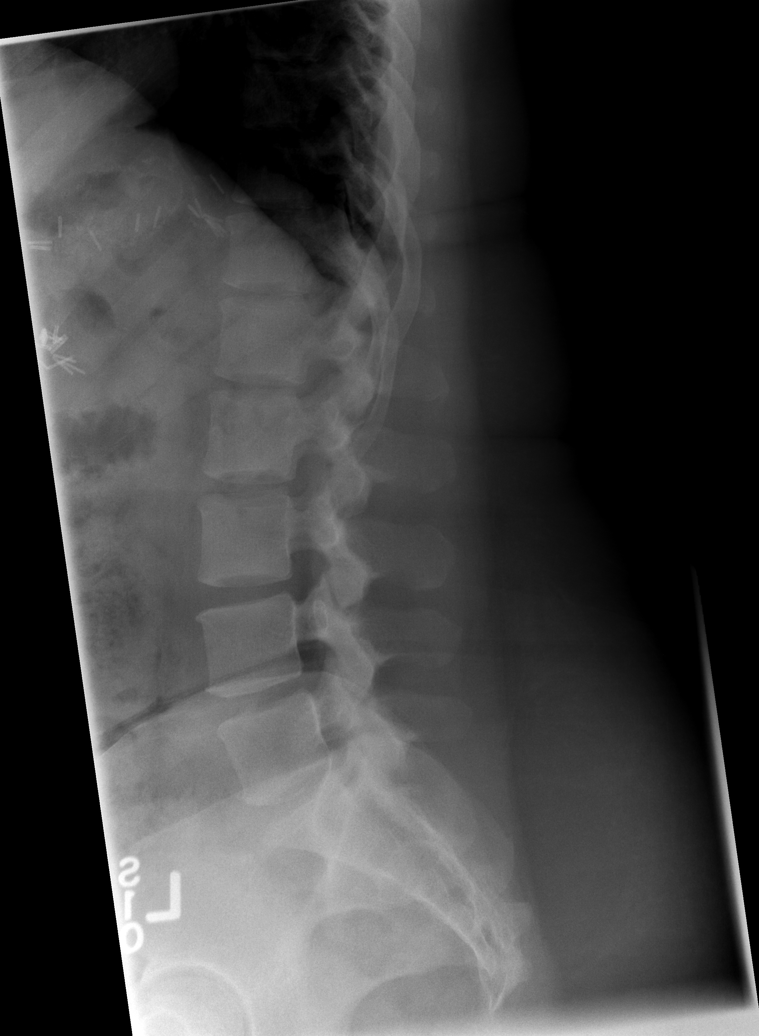

[t l-spine l5-s1 spot]
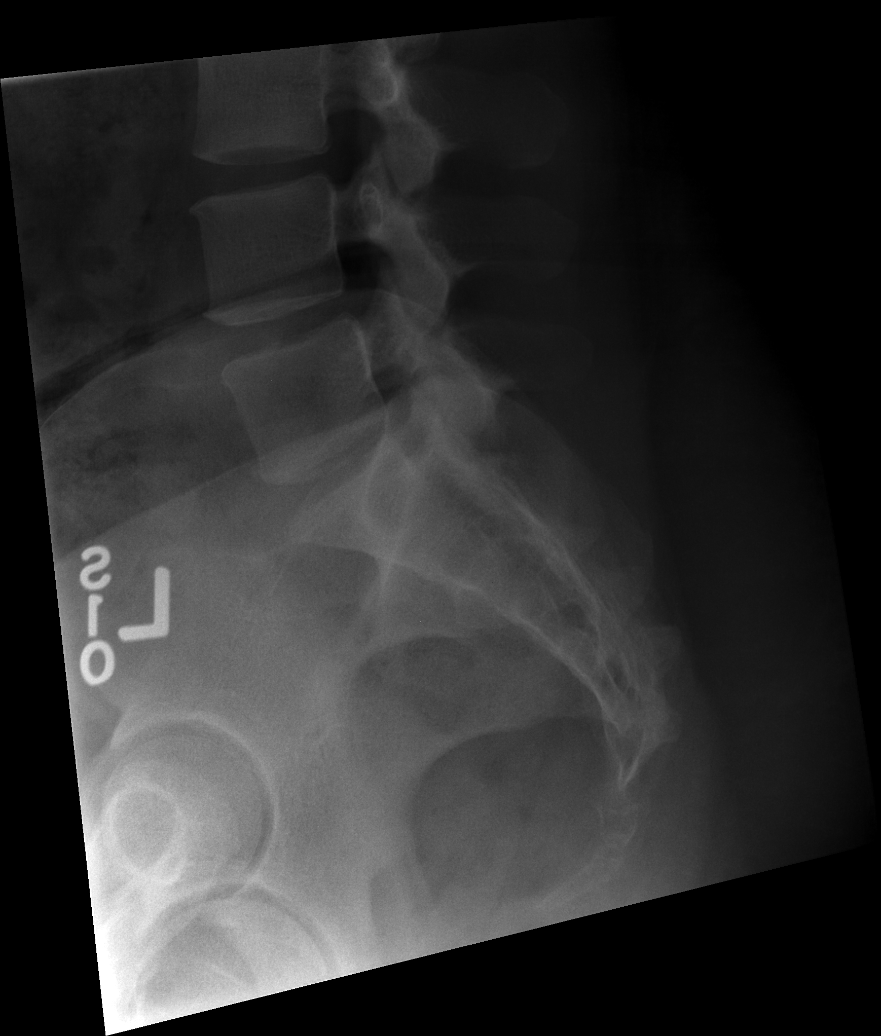

[5 of 5 positions shown; findings below may reference images not displayed]

FINDINGS: Extensive surgical changes in the upper abdomen bilaterally. Five
lumbar type vertebral bodies. Sacroiliac joints are symmetric.
Maintenance of vertebral body height and alignment. Intervertebral
disc heights are maintained. Mild facet arthropathy at L4-5 and
L5-S1.
IMPRESSION: Mild lower lumbar spondylosis, without acute osseous abnormality.

## 2016-07-03 ENCOUNTER — Other Ambulatory Visit: Payer: Self-pay | Admitting: Plastic Surgery

## 2016-07-03 DIAGNOSIS — D242 Benign neoplasm of left breast: Secondary | ICD-10-CM

## 2016-07-10 ENCOUNTER — Other Ambulatory Visit: Payer: Self-pay | Admitting: Family Medicine

## 2016-07-10 DIAGNOSIS — D242 Benign neoplasm of left breast: Secondary | ICD-10-CM

## 2016-07-11 ENCOUNTER — Other Ambulatory Visit: Payer: Self-pay | Admitting: Family Medicine

## 2016-07-11 ENCOUNTER — Ambulatory Visit
Admission: RE | Admit: 2016-07-11 | Discharge: 2016-07-11 | Disposition: A | Discharge status: Home / Self Care | Payer: BLUE CROSS/BLUE SHIELD | Source: Ambulatory Visit | Attending: Family Medicine | Admitting: Family Medicine

## 2016-07-11 ENCOUNTER — Other Ambulatory Visit: Payer: BLUE CROSS/BLUE SHIELD

## 2016-07-11 DIAGNOSIS — N6310 Unspecified lump in the right breast, unspecified quadrant: Secondary | ICD-10-CM

## 2016-07-11 DIAGNOSIS — D242 Benign neoplasm of left breast: Secondary | ICD-10-CM

## 2016-10-09 ENCOUNTER — Encounter (HOSPITAL_COMMUNITY): Payer: Self-pay

## 2016-10-09 ENCOUNTER — Emergency Department (HOSPITAL_BASED_OUTPATIENT_CLINIC_OR_DEPARTMENT_OTHER)
Admit: 2016-10-09 | Discharge: 2016-10-09 | Disposition: A | Discharge status: Home / Self Care | Payer: BLUE CROSS/BLUE SHIELD | Attending: Emergency Medicine | Admitting: Emergency Medicine

## 2016-10-09 ENCOUNTER — Emergency Department (HOSPITAL_COMMUNITY)
Admission: EM | Admit: 2016-10-09 | Discharge: 2016-10-09 | Disposition: A | Discharge status: Home / Self Care | Payer: BLUE CROSS/BLUE SHIELD | Attending: Emergency Medicine | Admitting: Emergency Medicine

## 2016-10-09 DIAGNOSIS — M79605 Pain in left leg: Secondary | ICD-10-CM

## 2016-10-09 DIAGNOSIS — J45909 Unspecified asthma, uncomplicated: Secondary | ICD-10-CM | POA: Insufficient documentation

## 2016-10-09 DIAGNOSIS — M79609 Pain in unspecified limb: Secondary | ICD-10-CM | POA: Diagnosis not present

## 2016-10-09 DIAGNOSIS — Z87891 Personal history of nicotine dependence: Secondary | ICD-10-CM | POA: Insufficient documentation

## 2016-10-09 MED ORDER — IBUPROFEN 800 MG PO TABS: 800.0000 mg | ORAL_TABLET | Freq: Three times a day (TID) | ORAL | 0 refills | Status: DC | PRN

## 2016-10-09 NOTE — ED Provider Notes (Signed)
[facility] DEPT Provider Note   CSN: [id number] Arrival date & time: 10/09/16  1424     History   Chief Complaint Chief Complaint  Patient presents with  . Leg Pain    LEFT    HPI [facility] is a 46 y.o. female.  The history is provided by the patient and medical records. No language interpreter was used.  Leg Pain    [facility] is a 46 y.o. female  with a PMH of right leg DVT who presents to the Emergency Department complaining of worsening left calf pain x 2 days. 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 Patient has made several long car rides over the last week. Pain is worse with movement and palpation. No medications/treatments prior to arrival for symptoms. No alleviating factors noted. No rashes/erythema/warmth. No Chest pain, shortness of breath. Not on anticoagulation. No hormone use.   Past Medical History:  Diagnosis Date  . Anemia   . Asthma   . Bone spur 2008  . 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 Migraines     Patient Active Problem List   Diagnosis Date Noted  . BV (bacterial vaginosis) 06/23/2015  . Tinea pedis 06/21/2015  . Screening-pulmonary TB 03/15/2015  . Obesity (BMI 30-39.9) 05/26/2014  . Asthma, chronic 05/25/2014  . Migraine 05/25/2014  . Chronic back pain 05/25/2014    Past Surgical History:  Procedure Laterality Date  . CESAREAN SECTION    . CHOLECYSTECTOMY    . GASTRIC BYPASS    . KNEE SURGERY    . SHOULDER SURGERY Left 2008   Bone Spur removal    OB History    Gravida Para Term Preterm AB Living   13       13 3    SAB TAB Ectopic Multiple Live Births   13               Home Medications    Prior to Admission medications   Medication Sig Start Date End Date Taking? Authorizing Provider  albuterol (PROVENTIL HFA;VENTOLIN HFA) 108 (90 BASE)  MCG/ACT inhaler Inhale 2 puffs into the lungs every 6 (six) hours as needed for wheezing. Patient not taking: Reported on 03/23/2016 06/10/14   Thao P Le, DO  albuterol (PROVENTIL) (2.5 MG/3ML) 0.083% nebulizer solution Take 3 mLs (2.5 mg total) by nebulization every 4 (four) hours as needed for wheezing or shortness of breath. Patient not taking: Reported on 03/23/2016 06/10/14   Thao P Le, DO  amoxicillin (AMOXIL) 500 MG capsule Take 2 capsules (1,000 mg total) by mouth 2 (two) times daily. Patient not taking: Reported on 03/23/2016 [id number]   Delora Fuel, MD  fluconazole (DIFLUCAN) 150 MG tablet Take 1 tablet (150 mg total) by mouth daily. Patient not taking: Reported on 03/23/2016 [id number]   Delora Fuel, MD  ibuprofen (ADVIL,MOTRIN) 800 MG tablet Take 1 tablet (800 mg total) by mouth every 8 (eight) hours as needed. 10/09/16   Ozella Almond Ward, PA-C  naproxen (NAPROSYN) 500 MG tablet Take 1 tablet (500 mg total) by mouth 2 (two) times daily. Patient not taking: Reported on 02/14/2016 [id number]   Delora Fuel, MD  ondansetron (ZOFRAN) 4 MG tablet Take 1 tablet (4 mg total) by mouth every 8 (eight) hours as needed for nausea or vomiting. Patient not taking: Reported on 03/23/2016 08/17/15   Kristin Bruins  Tamala Julian, MD  oxyCODONE-acetaminophen (PERCOCET/ROXICET) 5-325 MG tablet Take 1 tablet by mouth every 4 (four) hours as needed for severe pain. 03/23/16   [name], PA-C  phentermine 37.5 MG capsule Take 37.5 mg by mouth daily.    Historical Provider, MD  traMADol (ULTRAM) 50 MG tablet Take 1 tablet (50 mg total) by mouth every 6 (six) hours as needed. Patient not taking: Reported on 02/14/2016 [id number]   Delora Fuel, MD    Family History Family History  Problem Relation Age of Onset  . Cancer Father     oral    Social History Social History  Substance Use Topics  . Smoking status: Former Research scientist (life sciences)  . Smokeless tobacco: Never Used  . Alcohol use Yes     Comment: less than once a week-usually only  holidays/special occasions     Allergies   Patient has no known allergies.   Review of Systems Review of Systems  Constitutional: Negative for chills and fever.  HENT: Negative for congestion.   Eyes: Negative for visual disturbance.  Respiratory: Negative for cough and shortness of breath.   Cardiovascular: Positive for leg swelling. Negative for chest pain and palpitations.  Gastrointestinal: Negative for abdominal pain, nausea and vomiting.  Genitourinary: Negative for dysuria.  Musculoskeletal: Positive for arthralgias and myalgias.  Skin: Negative for color change and rash.  Neurological: Negative for headaches.     Physical Exam Updated Vital Signs BP 121/63 (BP Location: Left Arm)   Pulse 91   Temp 97.9 F (36.6 C) (Oral)   Resp 20   Ht 5\' 4"  (1.626 m)   Wt 104.3 kg   LMP 09/01/2016   SpO2 100%   BMI 39.48 kg/m   Physical Exam  Constitutional: She is oriented to person, place, and time. She appears well-developed and well-nourished. No distress.  HENT:  Head: Normocephalic and atraumatic.  Cardiovascular: Normal rate, regular rhythm and normal heart sounds.   No murmur heard. Pulmonary/Chest: Effort normal and breath sounds normal. No respiratory distress.  Abdominal: Soft. She exhibits no distension. There is no tenderness.  Musculoskeletal:  Left leg with full ROM. Tenderness to palpation of calf and behind knee. Minimal swelling appreciated. No erythema or warmth. Ligaments intact. All compartments soft. 2+ radial pulse and sensation intact.   Neurological: She is alert and oriented to person, place, and time.  Skin: Skin is warm and dry.  Nursing note and vitals reviewed.    ED Treatments / Results  Labs (all labs ordered are listed, but only abnormal results are displayed) Labs Reviewed - No data to display  EKG  EKG Interpretation None       Radiology No results found.  Procedures Procedures (including critical care  time)  Medications Ordered in ED Medications - No data to display   Initial Impression / Assessment and Plan / ED Course  I have reviewed the triage vital signs and the nursing notes.  Pertinent labs & imaging results that were available during my care of the patient were reviewed by me and considered in my medical decision making (see chart for details).    Valetta Kingsland Wheeler is a 46 y.o. female who presents to ED for left calf pain and swelling x 2 days. On exam, LLE is NVI. Tenderness to palpation to calf/posterior knee. DVT study negative. Symptomatic home care instructions discussed. Rx for ibuprofen given. PCP follow up if no improvement in 2-3 days. Return precautions discussed and all questions answered.   Final Clinical Impressions(s) /  ED Diagnoses   Final diagnoses:  Left leg pain    New Prescriptions New Prescriptions   IBUPROFEN (ADVIL,MOTRIN) 800 MG TABLET    Take 1 tablet (800 mg total) by mouth every 8 (eight) hours as needed.     [facility] Ward, PA-C 10/09/16 1647    Isla Pence, MD 10/09/16 [phone]

## 2016-10-09 NOTE — ED Notes (Signed)
PT DISCHARGED. INSTRUCTIONS AND PRESCRIPTION GIVEN. AAOX4. PT IN NO APPARENT DISTRESS. THE OPPORTUNITY TO ASK QUESTIONS WAS PROVIDED. 

## 2016-10-09 NOTE — ED Notes (Signed)
Bed: WA32 Expected date:  Expected time:  Means of arrival:  Comments: 

## 2016-10-09 NOTE — Progress Notes (Signed)
*  Preliminary Results* Left lower extremity venous duplex completed. Left lower extremity is negative for deep vein thrombosis. There is no evidence of left Baker's cyst.  10/09/2016 4:34 PM  Maudry Mayhew, BS, RVT, RDCS, RDMS

## 2016-10-09 NOTE — ED Triage Notes (Addendum)
PT C/O PAIN AND SWELLING TO THE LEFT LOWER LEG GOING BEHIND THE KNEE SINCE SATURDAY. PT STS SHE DROVE [place], THEN BACK TO [place] OVER THE WEEKEND. PT DENIES ANY CHEST PAIN, SOB, BLOOD THINNER OR ASPIRIN USE. 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

## 2016-10-09 NOTE — Discharge Instructions (Signed)
Ibuprofen as needed for pain. Ice and elevate for additional pain relief.  Follow up with your primary care provider if symptoms do not improve in the next 3-4 days.  Return to ER for new or worsening symptoms, any additional concerns.

## 2016-10-19 ENCOUNTER — Encounter (HOSPITAL_COMMUNITY): Payer: Self-pay | Admitting: Emergency Medicine

## 2016-10-19 ENCOUNTER — Emergency Department (HOSPITAL_COMMUNITY)
Admission: EM | Admit: 2016-10-19 | Discharge: 2016-10-19 | Disposition: A | Discharge status: Home / Self Care | Payer: BLUE CROSS/BLUE SHIELD | Attending: Emergency Medicine | Admitting: Emergency Medicine

## 2016-10-19 ENCOUNTER — Emergency Department (HOSPITAL_COMMUNITY): Payer: BLUE CROSS/BLUE SHIELD

## 2016-10-19 DIAGNOSIS — Z79899 Other long term (current) drug therapy: Secondary | ICD-10-CM | POA: Insufficient documentation

## 2016-10-19 DIAGNOSIS — M25562 Pain in left knee: Secondary | ICD-10-CM | POA: Insufficient documentation

## 2016-10-19 DIAGNOSIS — Z87891 Personal history of nicotine dependence: Secondary | ICD-10-CM | POA: Diagnosis not present

## 2016-10-19 DIAGNOSIS — G8929 Other chronic pain: Secondary | ICD-10-CM | POA: Diagnosis not present

## 2016-10-19 DIAGNOSIS — J45909 Unspecified asthma, uncomplicated: Secondary | ICD-10-CM | POA: Insufficient documentation

## 2016-10-19 MED ORDER — NAPROXEN 500 MG PO TABS: 500.0000 mg | ORAL_TABLET | Freq: Two times a day (BID) | ORAL | 0 refills | Status: DC

## 2016-10-19 NOTE — ED Notes (Signed)
Patient Alert and oriented X4. Stable and ambulatory. Patient verbalized understanding of the discharge instructions.  Patient belongings were taken by the patient.  

## 2016-10-19 NOTE — Discharge Instructions (Signed)
Your left knee pain is likely due to arthritis.  Follow instruction below for care.  Follow up with orthopedist for further management.

## 2016-10-19 NOTE — ED Provider Notes (Signed)
[facility] DEPT Provider Note   CSN: [id number] Arrival date & time: 10/19/16  2224     History   Chief Complaint Chief Complaint  Patient presents with  . Knee Pain    HPI [facility] is a 46 y.o. female.  HPI   46 year old obese female with prior history of DVT presenting complaining of left knee pain.  Patient report for the past 3 weeks she has had recurrent pain to her left knee. She described pain as an achy sharp sensation, worsening with bending, walking up and down the steps and with prolonged standing. Rates the pain is moderate in severity. Pain radiates to the back of her knee. She denies any associated hip or ankle pain, no numbness, no back pain, no rash. Denies any fever or chills. Also denies any recent injury. No specific treatment tried. 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 She also states she tries to stick shift and extra movement bothers her knee. She was seen in the ER for right knee pain last month. She had a DVT study that was negative. She does not have an orthopedist.  Past Medical History:  Diagnosis Date  . Anemia   . Asthma   . Bone spur 2008  . 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 Migraines     Patient Active Problem List   Diagnosis Date Noted  . BV (bacterial vaginosis) 06/23/2015  . Tinea pedis 06/21/2015  . Screening-pulmonary TB 03/15/2015  . Obesity (BMI 30-39.9) 05/26/2014  . Asthma, chronic 05/25/2014  . Migraine 05/25/2014  . Chronic back pain 05/25/2014    Past Surgical History:  Procedure Laterality Date  . CESAREAN SECTION    . CHOLECYSTECTOMY    . GASTRIC BYPASS    . KNEE SURGERY    . SHOULDER SURGERY Left 2008   Bone Spur removal    OB History    Gravida Para Term Preterm AB Living   13       13 3    SAB TAB Ectopic Multiple Live Births   13               Home Medications    Prior to Admission medications    Medication Sig Start Date End Date Taking? Authorizing Provider  albuterol (PROVENTIL HFA;VENTOLIN HFA) 108 (90 BASE) MCG/ACT inhaler Inhale 2 puffs into the lungs every 6 (six) hours as needed for wheezing. Patient not taking: Reported on 03/23/2016 06/10/14   Thao P Le, DO  albuterol (PROVENTIL) (2.5 MG/3ML) 0.083% nebulizer solution Take 3 mLs (2.5 mg total) by nebulization every 4 (four) hours as needed for wheezing or shortness of breath. Patient not taking: Reported on 03/23/2016 06/10/14   Thao P Le, DO  amoxicillin (AMOXIL) 500 MG capsule Take 2 capsules (1,000 mg total) by mouth 2 (two) times daily. Patient not taking: Reported on 03/23/2016 4/69/62   Delora Fuel, MD  fluconazole (DIFLUCAN) 150 MG tablet Take 1 tablet (150 mg total) by mouth daily. Patient not taking: Reported on 03/23/2016 9/52/84   Delora Fuel, MD  ibuprofen (ADVIL,MOTRIN) 800 MG tablet Take 1 tablet (800 mg total) by mouth every 8 (eight) hours as needed. 10/09/16   Ozella Almond Ward, PA-C  naproxen (NAPROSYN) 500 MG tablet Take 1 tablet (500 mg total) by mouth 2 (two) times daily. Patient not taking: Reported on 02/14/2016 1/32/44   Delora Fuel, MD  ondansetron (  ZOFRAN) 4 MG tablet Take 1 tablet (4 mg total) by mouth every 8 (eight) hours as needed for nausea or vomiting. Patient not taking: Reported on 03/23/2016 08/17/15   Sherian Maroon, MD  oxyCODONE-acetaminophen (PERCOCET/ROXICET) 5-325 MG tablet Take 1 tablet by mouth every 4 (four) hours as needed for severe pain. 03/23/16   [name], PA-C  phentermine 37.5 MG capsule Take 37.5 mg by mouth daily.    Historical Provider, MD  traMADol (ULTRAM) 50 MG tablet Take 1 tablet (50 mg total) by mouth every 6 (six) hours as needed. Patient not taking: Reported on 02/14/2016 7/37/10   Delora Fuel, MD    Family History Family History  Problem Relation Age of Onset  . Cancer Father     oral    Social History Social History  Substance Use Topics  . Smoking status: Former  Research scientist (life sciences)  . Smokeless tobacco: Never Used  . Alcohol use Yes     Comment: less than once a week-usually only holidays/special occasions     Allergies   Patient has no known allergies.   Review of Systems Review of Systems  Constitutional: Negative for fever.  Musculoskeletal: Positive for arthralgias. Negative for back pain.  Skin: Negative for rash and wound.  Neurological: Negative for numbness.     Physical Exam Updated Vital Signs BP 128/96 (BP Location: Left Arm)   Pulse 64   Temp 98.1 F (36.7 C) (Oral)   Resp 19   Ht 5\' 3"  (1.6 m)   Wt 104.3 kg   SpO2 100%   BMI 40.74 kg/m   Physical Exam  Constitutional: She appears well-developed and well-nourished. No distress.  Moderately obese female sitting in the chair in no acute discomfort, working on her laptop.  HENT:  Head: Atraumatic.  Eyes: Conjunctivae are normal.  Neck: Neck supple.  Abdominal: Soft. There is no tenderness.  Musculoskeletal: She exhibits tenderness (Left knee: Tenderness to anterior knee on palpation diffusely with decreased knee flexion. No joint laxity, no warmth, no erythema, no significant effusion or rash.).  Left hip and left ankle are nontender to palpation. Intact distal pedal pulse on the left side. Able to ambulate.  Bilateral lower extremities without palpable cords, erythema, or edema  Neurological: She is alert.  Skin: No rash noted.  Psychiatric: She has a normal mood and affect.  Nursing note and vitals reviewed.    ED Treatments / Results  Labs (all labs ordered are listed, but only abnormal results are displayed) Labs Reviewed - No data to display  EKG  EKG Interpretation None       Radiology Dg Knee Complete 4 Views Left  Result Date: 10/19/2016 CLINICAL DATA:  Nontraumatic left knee pain and swelling for 3 weeks EXAM: LEFT KNEE - COMPLETE 4+ VIEW COMPARISON:  None. FINDINGS: Negative for fracture or other acute bony abnormality. Mild osteoarthritic changes are  present in the medial compartment. There is no bone lesion or bony destruction. No acute soft tissue abnormality. IMPRESSION: Mild medial compartment osteoarthritis.  No acute findings. Electronically Signed   By: Andreas Newport M.D.   On: 10/19/2016 23:02    Procedures Procedures (including critical care time)  Medications Ordered in ED Medications - No data to display   Initial Impression / Assessment and Plan / ED Course  I have reviewed the triage vital signs and the nursing notes.  Pertinent labs & imaging results that were available during my care of the patient were reviewed by me and considered in  my medical decision making (see chart for details).     BP 128/96 (BP Location: Left Arm)   Pulse 64   Temp 98.1 F (36.7 C) (Oral)   Resp 19   Ht 5\' 3"  (1.6 m)   Wt 104.3 kg   SpO2 100%   BMI 40.74 kg/m    Final Clinical Impressions(s) / ED Diagnoses   Final diagnoses:  Chronic pain of left knee    New Prescriptions Current Discharge Medication List     11:15 PM Patient here with recurrent left knee pain for the past 3 weeks. Pain is suggestive of arthritis. No evidence to suggest cellulitis, septic joint, or other acute concerning finding. Low suspicion for DVT. Rice therapy discussed, orthopedic referral given as needed. Return precaution discussed.   Domenic Moras, PA-C 10/19/16 2316    Gwenyth Allegra Tegeler, MD 10/20/16 1120

## 2016-10-19 NOTE — ED Triage Notes (Signed)
Pt presents to ER from home with L knee pain and swelling; pt denies any known injury; pt reports increased pain with weight bearing

## 2016-10-31 ENCOUNTER — Encounter (HOSPITAL_COMMUNITY): Payer: Self-pay | Admitting: Emergency Medicine

## 2016-10-31 ENCOUNTER — Emergency Department (HOSPITAL_COMMUNITY)
Admission: EM | Admit: 2016-10-31 | Discharge: 2016-10-31 | Discharge status: Against Medical Advice | Payer: BLUE CROSS/BLUE SHIELD | Source: Home / Self Care

## 2016-10-31 ENCOUNTER — Emergency Department (HOSPITAL_COMMUNITY)
Admission: EM | Admit: 2016-10-31 | Discharge: 2016-10-31 | Disposition: A | Discharge status: Home / Self Care | Payer: BLUE CROSS/BLUE SHIELD | Attending: Emergency Medicine | Admitting: Emergency Medicine

## 2016-10-31 ENCOUNTER — Emergency Department (HOSPITAL_COMMUNITY): Payer: BLUE CROSS/BLUE SHIELD

## 2016-10-31 DIAGNOSIS — J45909 Unspecified asthma, uncomplicated: Secondary | ICD-10-CM | POA: Insufficient documentation

## 2016-10-31 DIAGNOSIS — Z87891 Personal history of nicotine dependence: Secondary | ICD-10-CM | POA: Insufficient documentation

## 2016-10-31 DIAGNOSIS — Z5321 Procedure and treatment not carried out due to patient leaving prior to being seen by health care provider: Secondary | ICD-10-CM | POA: Insufficient documentation

## 2016-10-31 DIAGNOSIS — R0602 Shortness of breath: Secondary | ICD-10-CM | POA: Insufficient documentation

## 2016-10-31 DIAGNOSIS — M7989 Other specified soft tissue disorders: Secondary | ICD-10-CM | POA: Insufficient documentation

## 2016-10-31 DIAGNOSIS — Z79899 Other long term (current) drug therapy: Secondary | ICD-10-CM | POA: Insufficient documentation

## 2016-10-31 DIAGNOSIS — R0789 Other chest pain: Secondary | ICD-10-CM | POA: Insufficient documentation

## 2016-10-31 NOTE — ED Notes (Signed)
Pt informed registration that she was leaving.  

## 2016-10-31 NOTE — ED Notes (Signed)
I attempted to collect labs and was unsuccessful. 

## 2016-10-31 NOTE — ED Triage Notes (Signed)
Pt from home with complaints of left leg swelling x 1 week. Pt states she has not seen her PCP for this, but has been elevating her leg and wrapping it at home. Pt states she also has SOB and CP that began today while she was letting her dogs out around 1430. Pt describes it as non-radiating central chest pressure. Pt endorses SOB Pt denies hx of htn or cardiac hx. Pt is a former smoker. Pt denies recent illness. Pt is not febrile nor tachycardic at time of assessment. Pt has diminished yet clear lung sounds and SI and S2 heart sounds. Pt has strong bilateral radial pulses.

## 2016-10-31 NOTE — ED Notes (Signed)
Pt called for blood draw with no response.  RN Notified.

## 2016-11-01 ENCOUNTER — Emergency Department (HOSPITAL_BASED_OUTPATIENT_CLINIC_OR_DEPARTMENT_OTHER)
Admit: 2016-11-01 | Discharge: 2016-11-01 | Disposition: A | Discharge status: Home / Self Care | Payer: BLUE CROSS/BLUE SHIELD

## 2016-11-01 ENCOUNTER — Emergency Department (HOSPITAL_COMMUNITY): Payer: BLUE CROSS/BLUE SHIELD

## 2016-11-01 ENCOUNTER — Emergency Department (HOSPITAL_COMMUNITY)
Admission: EM | Admit: 2016-11-01 | Discharge: 2016-11-01 | Disposition: A | Discharge status: Home / Self Care | Payer: BLUE CROSS/BLUE SHIELD | Attending: Emergency Medicine | Admitting: Emergency Medicine

## 2016-11-01 ENCOUNTER — Encounter (HOSPITAL_COMMUNITY): Payer: Self-pay | Admitting: Emergency Medicine

## 2016-11-01 DIAGNOSIS — J45909 Unspecified asthma, uncomplicated: Secondary | ICD-10-CM | POA: Insufficient documentation

## 2016-11-01 DIAGNOSIS — M25562 Pain in left knee: Secondary | ICD-10-CM | POA: Insufficient documentation

## 2016-11-01 DIAGNOSIS — M79609 Pain in unspecified limb: Secondary | ICD-10-CM

## 2016-11-01 DIAGNOSIS — Z87891 Personal history of nicotine dependence: Secondary | ICD-10-CM | POA: Insufficient documentation

## 2016-11-01 DIAGNOSIS — G8929 Other chronic pain: Secondary | ICD-10-CM

## 2016-11-01 LAB — I-STAT CHEM 8, ED
BUN: 5 mg/dL — ABNORMAL LOW (ref 6–20)
Calcium, Ion: 1.13 mmol/L — ABNORMAL LOW (ref 1.15–1.40)
Chloride: 107 mmol/L (ref 101–111)
Creatinine, Ser: 0.8 mg/dL (ref 0.44–1.00)
Glucose, Bld: 77 mg/dL (ref 65–99)
HCT: 34 % — ABNORMAL LOW (ref 36.0–46.0)
Hemoglobin: 11.6 g/dL — ABNORMAL LOW (ref 12.0–15.0)
Potassium: 4 mmol/L (ref 3.5–5.1)
Sodium: 142 mmol/L (ref 135–145)
TCO2: 27 mmol/L (ref 0–100)

## 2016-11-01 MED ORDER — NAPROXEN 500 MG PO TBEC: 500.0000 mg | DELAYED_RELEASE_TABLET | Freq: Two times a day (BID) | ORAL | 0 refills | Status: AC

## 2016-11-01 NOTE — ED Notes (Signed)
Hooked patient up to the monitor patient is resting 

## 2016-11-01 NOTE — ED Triage Notes (Addendum)
Pt states her left knee has been swollen for about a month and the pain has increased. Pt able to ambulate but is in a lot of pain. Denies injury. Pt has hx of DVT, and states the pain feels similar to this.

## 2016-11-01 NOTE — Discharge Instructions (Signed)
Please follow up with the orthopedic doctors for follow up of your knee pain. You may take EC Naprosyn twice per day with food; do not take any other NSAIDs (advil, motrin, aleve, ibuprofen, etc.) while you are on this. Tylenol is okay. Return to the ED if you have any worsening of symptoms, or if you develop redness and pain of your calves, fever/chills, redness or warmth to your joints, shortness of breath/chest pain, or vomit up blood.

## 2016-11-01 NOTE — ED Notes (Signed)
Ambulated pt to restroom, no issues.

## 2016-11-01 NOTE — Progress Notes (Signed)
*  PRELIMINARY RESULTS* Vascular Ultrasound left lower extremity venous duplex has been completed.  Preliminary findings: No evidence of deep vein thrombosis in the visualized veins of the left lower extremity. Complex area noted in patients left medial knee, area of complaint.  Everrett Coombe 11/01/2016, 12:29 PM

## 2016-11-01 NOTE — ED Provider Notes (Signed)
[facility] DEPT Provider Note   CSN: [id number] Arrival date & time: 11/01/16  1056     History   Chief Complaint Chief Complaint  Patient presents with  . Knee Pain    HPI Yesenia Wheeler is a 46 y.o. female who presents to the ED today with chief complaint ongoing and worsening left knee pain. She states she has had pain and swelling for >4 weeks now, worse with flexion, walking, and weight-bearing, alleviated somewhat by extension. Pain radiates to calf and lower thigh. She has tried NSAIDs, tylenol,  Warm compresses, ice packs, with minimal relief. Pain worsened 3 days ago and is now 10/10 achey, throbbing pain, associated with some nausea but no vomitting. She denies any recent trauma/falls. Denies numbness/tingling/weakness. She states she had some chest pressure and SOB but she treated this with a breathing treatment and she states symptoms resolved. Denies HA, dizziness, CP/SOB, abd pain, v/d, dysuria, hematuria, melena at this time. She has a hx of DVT (last one 2 years ago) but states this pain feels differently and denies recent travel/surgery, hemoptysis, or estrogen therapy.   The history is provided by the patient.    Past Medical History:  Diagnosis Date  . Anemia   . Asthma   . Bone spur 2008  . 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 Migraines     Patient Active Problem List   Diagnosis Date Noted  . BV (bacterial vaginosis) 06/23/2015  . Tinea pedis 06/21/2015  . Screening-pulmonary TB 03/15/2015  . Obesity (BMI 30-39.9) 05/26/2014  . Asthma, chronic 05/25/2014  . Migraine 05/25/2014  . Chronic back pain 05/25/2014    Past Surgical History:  Procedure Laterality Date  . CESAREAN SECTION    . CHOLECYSTECTOMY    . GASTRIC BYPASS    . KNEE SURGERY    . SHOULDER SURGERY Left 2008   Bone Spur removal    OB History    Gravida Para Term Preterm AB Living   13       13  3    SAB TAB Ectopic Multiple Live Births   13               Home Medications    Prior to Admission medications   Medication Sig Start Date End Date Taking? Authorizing Provider  albuterol (PROVENTIL HFA;VENTOLIN HFA) 108 (90 BASE) MCG/ACT inhaler Inhale 2 puffs into the lungs every 6 (six) hours as needed for wheezing. Patient not taking: Reported on 03/23/2016 06/10/14   Thao P Le, DO  albuterol (PROVENTIL) (2.5 MG/3ML) 0.083% nebulizer solution Take 3 mLs (2.5 mg total) by nebulization every 4 (four) hours as needed for wheezing or shortness of breath. Patient not taking: Reported on 03/23/2016 06/10/14   Thao P Le, DO  amoxicillin (AMOXIL) 500 MG capsule Take 2 capsules (1,000 mg total) by mouth 2 (two) times daily. Patient not taking: Reported on 03/23/2016 9/37/16   Delora Fuel, MD  fluconazole (DIFLUCAN) 150 MG tablet Take 1 tablet (150 mg total) by mouth daily. Patient not taking: Reported on 03/23/2016 9/67/89   Delora Fuel, MD  ibuprofen (ADVIL,MOTRIN) 800 MG tablet Take 1 tablet (800 mg total) by mouth every 8 (eight) hours as needed. 10/09/16   Ozella Almond Ward, PA-C  naproxen (EC-NAPROSYN) 500 MG EC tablet Take 1 tablet (500 mg total) by mouth 2 (two) times daily with a meal. 11/01/16 11/08/16  Allex Madia A  Artesia Berkey, PA  ondansetron (ZOFRAN) 4 MG tablet Take 1 tablet (4 mg total) by mouth every 8 (eight) hours as needed for nausea or vomiting. Patient not taking: Reported on 03/23/2016 08/17/15   Sherian Maroon, MD  oxyCODONE-acetaminophen (PERCOCET/ROXICET) 5-325 MG tablet Take 1 tablet by mouth every 4 (four) hours as needed for severe pain. 03/23/16   [name], PA-C  phentermine 37.5 MG capsule Take 37.5 mg by mouth daily.    Historical Provider, MD  traMADol (ULTRAM) 50 MG tablet Take 1 tablet (50 mg total) by mouth every 6 (six) hours as needed. Patient not taking: Reported on 02/14/2016 1/60/73   Delora Fuel, MD    Family History Family History  Problem Relation Age of Onset  . Cancer  Father     oral    Social History Social History  Substance Use Topics  . Smoking status: Former Research scientist (life sciences)  . Smokeless tobacco: Never Used  . Alcohol use Yes     Comment: less than once a week-usually only holidays/special occasions     Allergies   Patient has no known allergies.   Review of Systems Review of Systems  Constitutional: Negative for chills and fever.  HENT: Negative for congestion and sneezing.   Respiratory: Negative for cough, chest tightness and shortness of breath.   Cardiovascular: Negative for chest pain and leg swelling.  Gastrointestinal: Positive for nausea. Negative for abdominal pain, blood in stool, constipation, diarrhea and vomiting.  Genitourinary: Negative for dysuria and hematuria.  Musculoskeletal: Positive for arthralgias and joint swelling.  Skin: Negative for color change and wound.  Neurological: Negative for dizziness, numbness and headaches.     Physical Exam Updated Vital Signs BP (!) 122/93   Pulse (!) 101   Temp 98.3 F (36.8 C) (Oral)   Resp 16   Ht 5\' 4"  (1.626 m)   Wt 104.3 kg   LMP 10/21/2016   SpO2 100%   BMI 39.48 kg/m   Physical Exam  Constitutional: She is oriented to person, place, and time. She appears well-developed and well-nourished. No distress.  Well-appearing obese female  HENT:  Head: Normocephalic and atraumatic.  Eyes: Conjunctivae are normal. Right eye exhibits no discharge. Left eye exhibits discharge. No scleral icterus.  Neck: Neck supple. No JVD present. No tracheal deviation present.  Cardiovascular: Normal rate, regular rhythm, normal heart sounds and intact distal pulses.   2+ DP/PT pulses in BLE  Pulmonary/Chest: Effort normal.  Right sided inspiratory wheezes, otherwise clear with no rales, rhonchi  Abdominal: Soft. Bowel sounds are normal. There is no tenderness.  Musculoskeletal: She exhibits tenderness. She exhibits no deformity.  Unable to appreciate significant swelling due to body  habitus. Normal ROM of right knee. Left knee with limited ROM due to pain. Tender to calf squeeze and palpation of lower thigh, most tender at the level of the medial and lateral menisci and LCL and MCL, no varus or valgus instability, negative anterior drawer test, no ligamentous laxity noted. No deformity or crepitus noted. No overlying erythema, cellulitis, or warmth of joint. Baker's cyst not palpated.   Neurological: She is alert and oriented to person, place, and time. No sensory deficit.  Skin: Skin is warm and dry. Capillary refill takes less than 2 seconds. She is not diaphoretic. No erythema.  Psychiatric: She has a normal mood and affect. Her behavior is normal. Judgment and thought content normal.     ED Treatments / Results  Labs (all labs ordered are listed, but only abnormal  results are displayed) Labs Reviewed  I-STAT CHEM 8, ED - Abnormal; Notable for the following:       Result Value   BUN 5 (*)    Calcium, Ion 1.13 (*)    Hemoglobin 11.6 (*)    HCT 34.0 (*)    All other components within normal limits    EKG  EKG Interpretation None       Radiology Dg Chest 2 View  Result Date: 10/31/2016 CLINICAL DATA:  Acute onset of mid chest pain and shortness of breath. Initial encounter. EXAM: CHEST  2 VIEW COMPARISON:  Chest radiograph performed 02/08/2016, and CTA of the chest performed 02/09/2016 FINDINGS: The lungs are well-aerated and clear. There is no evidence of focal opacification, pleural effusion or pneumothorax. The heart is normal in size; the mediastinal contour is within normal limits. No acute osseous abnormalities are seen. IMPRESSION: No acute cardiopulmonary process seen. Electronically Signed   By: Garald Balding M.D.   On: 10/31/2016 18:25   Dg Knee Complete 4 Views Left  Result Date: 11/01/2016 CLINICAL DATA:  Left knee pain anteriorly. EXAM: LEFT KNEE - COMPLETE 4+ VIEW COMPARISON:  None. FINDINGS: The left knee demonstrates no acute fracture or  dislocation. There is a moderate left knee joint effusion. There are small patellofemoral marginal osteophytes. IMPRESSION: No acute osseous injury of the left knee. Electronically Signed   By: Kathreen Devoid   On: 11/01/2016 12:52    Procedures Procedures (including critical care time)  Medications Ordered in ED Medications - No data to display   Initial Impression / Assessment and Plan / ED Course  I have reviewed the triage vital signs and the nursing notes.  Pertinent labs & imaging results that were available during my care of the patient were reviewed by me and considered in my medical decision making (see chart for details).     46yof presents to ED today with ongoing complaint of left knee pain. Pt afebrile, VSS. Repeat xrays show no acute osseous injury of left knee with moderate joint effusion. Hx of DVT, obtained venous doppler which did not know evidence of DVT but noted "complex area of left medial knee". Joint is not erythematous, warm, and no deformities or crepitus appreciated on exam. Low suspicion of gout, septic arthritis, or osteomyelitis. Likely due to arthritis. Pt appears comfortable on re-evaluation, no further emergent workup required. Discussed need for orthopedic evaluation of knee and pt understands. Provided with referral to orthopedics and given rx for ed naprosyn for pain. Discussed ED return precautions including redness/warmth of knee joint, CP/SOB, fever/chills.   Final Clinical Impressions(s) / ED Diagnoses   Final diagnoses:  Chronic pain of left knee    New Prescriptions Discharge Medication List as of 11/01/2016  2:39 PM    START taking these medications   Details  naproxen (EC-NAPROSYN) 500 MG EC tablet Take 1 tablet (500 mg total) by mouth 2 (two) times daily with a meal., Starting Wed 11/01/2016, Until Wed 11/08/2016, [place], [place] 11/01/16 [place], MD 11/01/16 1544

## 2016-11-01 NOTE — ED Notes (Signed)
Pt transported to vascular.  °

## 2016-11-24 ENCOUNTER — Other Ambulatory Visit: Payer: Self-pay

## 2016-11-24 ENCOUNTER — Encounter (HOSPITAL_COMMUNITY): Payer: Self-pay | Admitting: Emergency Medicine

## 2016-11-24 ENCOUNTER — Emergency Department (HOSPITAL_COMMUNITY)
Admission: EM | Admit: 2016-11-24 | Discharge: 2016-11-24 | Disposition: A | Discharge status: Home / Self Care | Payer: PRIVATE HEALTH INSURANCE | Attending: Emergency Medicine | Admitting: Emergency Medicine

## 2016-11-24 ENCOUNTER — Emergency Department (HOSPITAL_COMMUNITY): Payer: PRIVATE HEALTH INSURANCE

## 2016-11-24 DIAGNOSIS — Z87891 Personal history of nicotine dependence: Secondary | ICD-10-CM | POA: Diagnosis not present

## 2016-11-24 DIAGNOSIS — B9789 Other viral agents as the cause of diseases classified elsewhere: Secondary | ICD-10-CM

## 2016-11-24 DIAGNOSIS — J45909 Unspecified asthma, uncomplicated: Secondary | ICD-10-CM | POA: Diagnosis not present

## 2016-11-24 DIAGNOSIS — J069 Acute upper respiratory infection, unspecified: Secondary | ICD-10-CM | POA: Insufficient documentation

## 2016-11-24 DIAGNOSIS — R05 Cough: Secondary | ICD-10-CM | POA: Diagnosis present

## 2016-11-24 DIAGNOSIS — R0602 Shortness of breath: Secondary | ICD-10-CM | POA: Insufficient documentation

## 2016-11-24 LAB — CBC WITH DIFFERENTIAL/PLATELET
Basophils Absolute: 0 10*3/uL (ref 0.0–0.1)
Basophils Relative: 0 %
Eosinophils Absolute: 0.4 10*3/uL (ref 0.0–0.7)
Eosinophils Relative: 7 %
HCT: 34.7 % — ABNORMAL LOW (ref 36.0–46.0)
Hemoglobin: 11.3 g/dL — ABNORMAL LOW (ref 12.0–15.0)
Lymphocytes Relative: 36 %
Lymphs Abs: 1.8 10*3/uL (ref 0.7–4.0)
MCH: 26.8 pg (ref 26.0–34.0)
MCHC: 32.6 g/dL (ref 30.0–36.0)
MCV: 82.4 fL (ref 78.0–100.0)
Monocytes Absolute: 0.4 10*3/uL (ref 0.1–1.0)
Monocytes Relative: 8 %
Neutro Abs: 2.5 10*3/uL (ref 1.7–7.7)
Neutrophils Relative %: 49 %
Platelets: 247 10*3/uL (ref 150–400)
RBC: 4.21 MIL/uL (ref 3.87–5.11)
RDW: 14.7 % (ref 11.5–15.5)
WBC: 5.1 10*3/uL (ref 4.0–10.5)

## 2016-11-24 LAB — BASIC METABOLIC PANEL
ANION GAP: 8 (ref 5–15)
BUN: 9 mg/dL (ref 6–20)
CALCIUM: 8.4 mg/dL — AB (ref 8.9–10.3)
CO2: 21 mmol/L — AB (ref 22–32)
Chloride: 110 mmol/L (ref 101–111)
Creatinine, Ser: 0.93 mg/dL (ref 0.44–1.00)
GFR calc non Af Amer: >60 mL/min (ref >60)
Glucose, Bld: 98 mg/dL (ref 65–99)
POTASSIUM: 3.6 mmol/L (ref 3.5–5.1)
Sodium: 139 mmol/L (ref 135–145)

## 2016-11-24 LAB — D-DIMER, QUANTITATIVE: D-Dimer, Quant: 1.79 ug{FEU}/mL — ABNORMAL HIGH (ref 0.00–0.50)

## 2016-11-24 LAB — CBG MONITORING, ED: Glucose-Capillary: 65 mg/dL (ref 65–99)

## 2016-11-24 MED ORDER — IOPAMIDOL (ISOVUE-370) INJECTION 76%
INTRAVENOUS | Status: AC
  Administered 2016-11-24: 100 mL
  Filled 2016-11-24: qty 100

## 2016-11-24 MED ORDER — BENZONATATE 100 MG PO CAPS: 200.0000 mg | ORAL_CAPSULE | Freq: Two times a day (BID) | ORAL | 0 refills | Status: DC | PRN

## 2016-11-24 MED ORDER — ALBUTEROL SULFATE HFA 108 (90 BASE) MCG/ACT IN AERS
2.0000 | INHALATION_SPRAY | Freq: Once | RESPIRATORY_TRACT | Status: AC
  Administered 2016-11-24: 2 via RESPIRATORY_TRACT
  Filled 2016-11-24: qty 6.7

## 2016-11-24 MED ORDER — ACETAMINOPHEN 500 MG PO TABS
1000.0000 mg | ORAL_TABLET | Freq: Once | ORAL | Status: AC
  Administered 2016-11-24: 1000 mg via ORAL
  Filled 2016-11-24: qty 2

## 2016-11-24 MED ORDER — OXYMETAZOLINE HCL 0.05 % NA SOLN: 1.0000 | Freq: Two times a day (BID) | NASAL | 0 refills | Status: DC

## 2016-11-24 NOTE — ED Notes (Signed)
I tried to draw pt. Blood, I didn't have any success, the Nurse was informed.

## 2016-11-24 NOTE — ED Provider Notes (Signed)
[facility] DEPT Provider Note   CSN: [id number] Arrival date & time: 11/24/16  1513     History   Chief Complaint Chief Complaint  Patient presents with  . Shortness of Breath    HPI Yesenia Wheeler is a 46 y.o. female.  HPI   Patient is a 46 year old female with history of asthma, anemia and DVT who presents the ED with complaint of worsening shortness of breath, onset 2 days. She notes her shortness of breath is constant but is worse with exertion. Patient reports over the past 2 days she has had productive cough (green sputum), nasal congestion, sinus pressure, sore throat which has since resolved, chest tightness, chills and generalized body aches. Patient states she has been taking over-the-counter sinus and cold medication without relief. Denies fever, neck stiffness, visual changes, hemoptysis, chest pain, palpitations, abdominal pain, vomiting, diarrhea, leg swelling. Patient denies currently being on any anticoagulants. She denies any use of exogenous estrogen. She notes she drove to [place] (9 hour car ride) personally 3 weeks ago. Denies any recent hospitalizations, recent surgeries. Denies history of cancer. 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  Past Medical History:  Diagnosis Date  . Anemia   . Asthma   . Bone spur 2008  . 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 Migraines     Patient Active Problem List   Diagnosis Date Noted  . BV (bacterial vaginosis) 06/23/2015  . Tinea pedis 06/21/2015  . Screening-pulmonary TB 03/15/2015  . Obesity (BMI 30-39.9) 05/26/2014  . Asthma, chronic 05/25/2014  . Migraine 05/25/2014  . Chronic back pain 05/25/2014    Past Surgical History:  Procedure Laterality Date  . CESAREAN SECTION    . CHOLECYSTECTOMY    . GASTRIC BYPASS    . KNEE SURGERY    . SHOULDER SURGERY Left 2008   Bone Spur  removal    OB History    Gravida Para Term Preterm AB Living   13       13 3    SAB TAB Ectopic Multiple Live Births   13               Home Medications    Prior to Admission medications   Medication Sig Start Date End Date Taking? Authorizing Provider  albuterol (PROVENTIL HFA;VENTOLIN HFA) 108 (90 BASE) MCG/ACT inhaler Inhale 2 puffs into the lungs every 6 (six) hours as needed for wheezing. Patient not taking: Reported on 03/23/2016 06/10/14   Thao P Le, DO  albuterol (PROVENTIL) (2.5 MG/3ML) 0.083% nebulizer solution Take 3 mLs (2.5 mg total) by nebulization every 4 (four) hours as needed for wheezing or shortness of breath. Patient not taking: Reported on 03/23/2016 06/10/14   Thao P Le, DO  amoxicillin (AMOXIL) 500 MG capsule Take 2 capsules (1,000 mg total) by mouth 2 (two) times daily. Patient not taking: Reported on 03/23/2016 0/17/49   Delora Fuel, MD  benzonatate (TESSALON) 100 MG capsule Take 2 capsules (200 mg total) by mouth 2 (two) times daily as needed for cough. 11/24/16   Nona Dell, PA-C  fluconazole (DIFLUCAN) 150 MG tablet Take 1 tablet (150 mg total) by mouth daily. Patient not taking: Reported on 03/23/2016 4/49/67   Delora Fuel, MD  ibuprofen (ADVIL,MOTRIN) 800 MG tablet Take 1 tablet (800 mg total) by mouth every 8 (eight) hours as needed. 10/09/16  [facility] Ward, PA-C  ondansetron (ZOFRAN) 4 MG tablet Take 1 tablet (4 mg total) by mouth every 8 (eight) hours as needed for nausea or vomiting. Patient not taking: Reported on 03/23/2016 08/17/15   Sherian Maroon, MD  oxyCODONE-acetaminophen (PERCOCET/ROXICET) 5-325 MG tablet Take 1 tablet by mouth every 4 (four) hours as needed for severe pain. 03/23/16   [name], PA-C  oxymetazoline (AFRIN NASAL SPRAY) 0.05 % nasal spray Place 1 spray into both nostrils 2 (two) times daily. Spray once into each nostril twice daily for up to the next 3 days. Do not use for more than 3 days to prevent rebound rhinorrhea.  11/24/16   Nona Dell, PA-C  phentermine 37.5 MG capsule Take 37.5 mg by mouth daily.    Historical Provider, MD  traMADol (ULTRAM) 50 MG tablet Take 1 tablet (50 mg total) by mouth every 6 (six) hours as needed. Patient not taking: Reported on 02/14/2016 01/07/77   Delora Fuel, MD    Family History Family History  Problem Relation Age of Onset  . Cancer Father     oral    Social History Social History  Substance Use Topics  . Smoking status: Former Research scientist (life sciences)  . Smokeless tobacco: Never Used  . Alcohol use Yes     Comment: less than once a week-usually only holidays/special occasions     Allergies   Patient has no known allergies.   Review of Systems Review of Systems  Constitutional: Positive for chills.  HENT: Positive for congestion, sinus pain, sinus pressure and sore throat.   Respiratory: Positive for cough, chest tightness and shortness of breath.   Musculoskeletal: Positive for myalgias (generalized).  All other systems reviewed and are negative.    Physical Exam Updated Vital Signs BP 120/83 (BP Location: Right Arm)   Pulse 62   Temp 98.8 F (37.1 C) (Oral)   Resp 16   LMP 11/17/2016   SpO2 100%   Physical Exam  Constitutional: She is oriented to person, place, and time. She appears well-developed and well-nourished. No distress.  HENT:  Head: Normocephalic and atraumatic.  Nose: Right sinus exhibits maxillary sinus tenderness and frontal sinus tenderness. Left sinus exhibits maxillary sinus tenderness and frontal sinus tenderness.  Mouth/Throat: Uvula is midline, oropharynx is clear and moist and mucous membranes are normal. No oropharyngeal exudate, posterior oropharyngeal edema, posterior oropharyngeal erythema or tonsillar abscesses. No tonsillar exudate.  Eyes: Conjunctivae and EOM are normal. Pupils are equal, round, and reactive to light. Right eye exhibits no discharge. Left eye exhibits no discharge. No scleral icterus.  Neck: Normal range  of motion. Neck supple.  Cardiovascular: Normal rate, regular rhythm, normal heart sounds and intact distal pulses.   Pulmonary/Chest: Effort normal. No respiratory distress. She has wheezes. She has no rales. She exhibits no tenderness.  Mild end-expiratory wheezing bilaterally.  Abdominal: Soft. Bowel sounds are normal. She exhibits no distension and no mass. There is no tenderness. There is no rebound and no guarding. No hernia.  Musculoskeletal: Normal range of motion. She exhibits no edema.  Neurological: She is alert and oriented to person, place, and time.  Skin: Skin is warm and dry. She is not diaphoretic.  Nursing note and vitals reviewed.    ED Treatments / Results  Labs (all labs ordered are listed, but only abnormal results are displayed) Labs Reviewed  D-DIMER, QUANTITATIVE (NOT AT [facility]) - Abnormal; Notable for the following:       Result Value   D-Dimer, Quant  1.79 (*)    All other components within normal limits  CBC WITH DIFFERENTIAL/PLATELET - Abnormal; Notable for the following:    Hemoglobin 11.3 (*)    HCT 34.7 (*)    All other components within normal limits  BASIC METABOLIC PANEL - Abnormal; Notable for the following:    CO2 21 (*)    Calcium 8.4 (*)    All other components within normal limits  CBG MONITORING, ED    EKG  EKG Interpretation None       Radiology Dg Chest 2 View  Result Date: 11/24/2016 CLINICAL DATA:  Productive cough EXAM: CHEST  2 VIEW COMPARISON:  October 31, 2016 FINDINGS: There is no edema or consolidation. The heart size and pulmonary vascularity are normal. No adenopathy. There is evidence of old trauma involving the lateral left clavicle, stable. IMPRESSION: No edema or consolidation. Electronically Signed   By: Lowella Grip III M.D.   On: 11/24/2016 15:50   Ct Angio Chest Pe W And/or Wo Contrast  Result Date: 11/24/2016 CLINICAL DATA:  Shortness of breath and elevated D-dimer EXAM: CT ANGIOGRAPHY CHEST WITH CONTRAST  TECHNIQUE: Multidetector CT imaging of the chest was performed using the standard protocol during bolus administration of intravenous contrast. Multiplanar CT image reconstructions and MIPs were obtained to evaluate the vascular anatomy. CONTRAST:  100 mL Isovue 370 COMPARISON:  Chest radiograph 11/24/2016 CT chest 02/09/2016 FINDINGS: Cardiovascular: Contrast injection is sufficient to demonstrate satisfactory opacification of the pulmonary arteries to the segmental level. There is no pulmonary embolus. The main pulmonary artery is within normal limits for size. There is no CT evidence of acute right heart strain. The visualized aorta is normal. Heart size is normal, without pericardial effusion. Mediastinum/Nodes: No mediastinal, hilar or axillary lymphadenopathy. The visualized thyroid and thoracic esophageal course are unremarkable. Lungs/Pleura: No pulmonary nodules or masses. No pleural effusion or pneumothorax. No focal airspace consolidation. No focal pleural abnormality. Upper Abdomen: Contrast bolus timing is not optimized for evaluation of the abdominal organs. Status post cholecystectomy and gastric bypass. Musculoskeletal: No chest wall abnormality. No acute or significant osseous findings. Review of the MIP images confirms the above findings. IMPRESSION: No pulmonary embolus or other acute thoracic abnormality. Electronically Signed   By: Ulyses Jarred M.D.   On: 11/24/2016 22:11    Procedures Procedures (including critical care time)  Medications Ordered in ED Medications  albuterol (PROVENTIL HFA;VENTOLIN HFA) 108 (90 Base) MCG/ACT inhaler 2 puff (not administered)  acetaminophen (TYLENOL) tablet 1,000 mg (1,000 mg Oral Given 11/24/16 2037)  iopamidol (ISOVUE-370) 76 % injection (100 mLs  Contrast Given 11/24/16 2143)     Initial Impression / Assessment and Plan / ED Course  I have reviewed the triage vital signs and the nursing notes.  Pertinent labs & imaging results that were  available during my care of the patient were reviewed by me and considered in my medical decision making (see chart for details).     Patient presents with shortness of breath with associated nasal congestion, sinus pressure, sore throat, cough and chest tightness for the past 2 days. Denies fever but endorses associated chills. 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 Patient with hx of DVT, denies currently being on anticoagulants, endorses 9hr care ride 3 weeks ago. VSS. Exam revealed mild tenderness over frontal and maxillary sinuses. Lung exam revealed mild adnexal area wheezes bilaterally, patient without tachypnea or increased work of breathing. O2 saturation 100% on room air. Remaining  exam unremarkable. Chest x-ray negative. Discussed pt with Dr. Wilson Singer who also evaluated pt in the ED. due to patient with SOB and hx of DVT, will order d-dimer to evaluate for PE. D-dimer elevated at 1.79. CT angio chest negative for PE. Suspect pt's sxs are likely due to viral bronchitis/URI. Discussed results and plan for d/c with pt. Plan to d/c pt home with albuterol and rx for antitussive and decongestant. Advised pt to follow up with her PCP at her scheduled appointment on Monday. Discussed return precautions.   Final Clinical Impressions(s) / ED Diagnoses   Final diagnoses:  Viral URI with cough    New Prescriptions New Prescriptions   BENZONATATE (TESSALON) 100 MG CAPSULE    Take 2 capsules (200 mg total) by mouth 2 (two) times daily as needed for cough.   OXYMETAZOLINE (AFRIN NASAL SPRAY) 0.05 % NASAL SPRAY    Place 1 spray into both nostrils 2 (two) times daily. Spray once into each nostril twice daily for up to the next 3 days. Do not use for more than 3 days to prevent rebound rhinorrhea.     Chesley Noon [place], [place] 11/24/16 2250    Virgel Manifold, MD 11/29/16 [id number]

## 2016-11-24 NOTE — Discharge Instructions (Signed)
Take your medications as prescribed. Continue using her albuterol inhalers and for shortness of breath/wheezing. Follow-up with your primary care provider at your scheduled appointment on Monday. Return to the emergency department if symptoms worsen or new onset of fever, coughing up blood, difficulty breathing, chest pain, vomiting, unable to keep fluids down, leg swelling, numbness, weakness, syncope.

## 2016-11-24 NOTE — ED Triage Notes (Signed)
Pt presents to ED for assessment of SOB x 2 days with associated cough and green phlegm.  Hx of asthma, SpO2 100% RA.  Pt c/o congestion as well as headaches and low-grade fever.

## 2016-12-27 ENCOUNTER — Encounter: Payer: Self-pay | Admitting: Family Medicine

## 2017-03-15 ENCOUNTER — Other Ambulatory Visit: Payer: Self-pay | Admitting: Obstetrics and Gynecology

## 2017-03-16 ENCOUNTER — Other Ambulatory Visit: Payer: Self-pay | Admitting: Obstetrics and Gynecology

## 2017-03-16 DIAGNOSIS — N63 Unspecified lump in unspecified breast: Secondary | ICD-10-CM

## 2017-03-22 ENCOUNTER — Other Ambulatory Visit: Payer: PRIVATE HEALTH INSURANCE

## 2017-07-02 ENCOUNTER — Encounter (HOSPITAL_COMMUNITY): Payer: Self-pay | Admitting: Emergency Medicine

## 2017-07-02 ENCOUNTER — Emergency Department (HOSPITAL_COMMUNITY)
Admission: EM | Admit: 2017-07-02 | Discharge: 2017-07-02 | Disposition: A | Discharge status: Home / Self Care | Payer: PRIVATE HEALTH INSURANCE | Attending: Emergency Medicine | Admitting: Emergency Medicine

## 2017-07-02 ENCOUNTER — Other Ambulatory Visit: Payer: Self-pay

## 2017-07-02 ENCOUNTER — Emergency Department (HOSPITAL_COMMUNITY): Payer: PRIVATE HEALTH INSURANCE

## 2017-07-02 DIAGNOSIS — M545 Low back pain, unspecified: Secondary | ICD-10-CM

## 2017-07-02 DIAGNOSIS — D649 Anemia, unspecified: Secondary | ICD-10-CM | POA: Insufficient documentation

## 2017-07-02 DIAGNOSIS — Z79899 Other long term (current) drug therapy: Secondary | ICD-10-CM | POA: Insufficient documentation

## 2017-07-02 DIAGNOSIS — Z87891 Personal history of nicotine dependence: Secondary | ICD-10-CM | POA: Diagnosis not present

## 2017-07-02 DIAGNOSIS — R350 Frequency of micturition: Secondary | ICD-10-CM | POA: Diagnosis not present

## 2017-07-02 DIAGNOSIS — J45909 Unspecified asthma, uncomplicated: Secondary | ICD-10-CM | POA: Insufficient documentation

## 2017-07-02 DIAGNOSIS — G8929 Other chronic pain: Secondary | ICD-10-CM | POA: Diagnosis not present

## 2017-07-02 LAB — URINALYSIS, ROUTINE W REFLEX MICROSCOPIC
Bilirubin Urine: NEGATIVE
Glucose, UA: NEGATIVE mg/dL
Hgb urine dipstick: NEGATIVE
KETONES UR: NEGATIVE mg/dL
LEUKOCYTES UA: NEGATIVE
NITRITE: NEGATIVE
PROTEIN: NEGATIVE mg/dL
Specific Gravity, Urine: 1.019 (ref 1.005–1.030)
pH: 6 (ref 5.0–8.0)

## 2017-07-02 LAB — POC URINE PREG, ED: PREG TEST UR: NEGATIVE

## 2017-07-02 MED ORDER — DIAZEPAM 5 MG PO TABS
5.0000 mg | ORAL_TABLET | Freq: Once | ORAL | Status: AC
  Administered 2017-07-02: 5 mg via ORAL
  Filled 2017-07-02: qty 1

## 2017-07-02 MED ORDER — OXYCODONE-ACETAMINOPHEN 5-325 MG PO TABS
1.0000 | ORAL_TABLET | ORAL | Status: DC | PRN
  Administered 2017-07-02: 1 via ORAL
  Filled 2017-07-02 (×2): qty 1

## 2017-07-02 MED ORDER — NAPROXEN 500 MG PO TABS: 500.0000 mg | ORAL_TABLET | Freq: Two times a day (BID) | ORAL | 0 refills | Status: DC

## 2017-07-02 MED ORDER — DIAZEPAM 5 MG PO TABS: 5.0000 mg | ORAL_TABLET | Freq: Two times a day (BID) | ORAL | 0 refills | Status: DC

## 2017-07-02 MED ORDER — KETOROLAC TROMETHAMINE 60 MG/2ML IM SOLN
60.0000 mg | Freq: Once | INTRAMUSCULAR | Status: AC
  Administered 2017-07-02: 60 mg via INTRAMUSCULAR
  Filled 2017-07-02: qty 2

## 2017-07-02 NOTE — ED Provider Notes (Signed)
[facility] DEPARTMENT Provider Note   CSN: [id number] Arrival date & time: 07/02/17  9935     History   Chief Complaint Chief Complaint  Patient presents with  . Urinary Frequency  . Flank Pain    HPI Yesenia Wheeler is a 46 y.o. female.  HPI  Pt presenting with c/o bilateral lower back pain.  Pain is stiff and sore in nature.  Worse with movement and certain positions.  She has tried soma and flexeril without any relief.  No weakness of legs, no urinary retention.  She has had some mild pressure with urination.  No fever.  No abdominal pain.  No injury.  There are no other associated systemic symptoms, there are no other alleviating or modifying factors. No radiation to legs of pain.    Past Medical History:  Diagnosis Date  . Anemia   . Asthma   . Bone spur 2008  . 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 Migraines     Patient Active Problem List   Diagnosis Date Noted  . BV (bacterial vaginosis) 06/23/2015  . Tinea pedis 06/21/2015  . Screening-pulmonary TB 03/15/2015  . Obesity (BMI 30-39.9) 05/26/2014  . Asthma, chronic 05/25/2014  . Migraine 05/25/2014  . Chronic back pain 05/25/2014    Past Surgical History:  Procedure Laterality Date  . CESAREAN SECTION    . CHOLECYSTECTOMY    . GASTRIC BYPASS    . KNEE SURGERY    . SHOULDER SURGERY Left 2008   Bone Spur removal    OB History    Gravida Para Term Preterm AB Living   13       13 3    SAB TAB Ectopic Multiple Live Births   13               Home Medications    Prior to Admission medications   Medication Sig Start Date End Date Taking? Authorizing Provider  carisoprodol (SOMA) 350 MG tablet Take 350 mg 3 (three) times daily by mouth.   Yes [provider]  sertraline (ZOLOFT) 25 MG tablet Take 25 mg daily by mouth.   Yes [provider]  traZODone (DESYREL) 100 MG  tablet Take 100 mg at bedtime as needed by mouth for sleep.   Yes [provider]  albuterol (PROVENTIL HFA;VENTOLIN HFA) 108 (90 BASE) MCG/ACT inhaler Inhale 2 puffs into the lungs every 6 (six) hours as needed for wheezing. Patient not taking: Reported on 03/23/2016 06/10/14   Le, Thao P, DO  albuterol (PROVENTIL) (2.5 MG/3ML) 0.083% nebulizer solution Take 3 mLs (2.5 mg total) by nebulization every 4 (four) hours as needed for wheezing or shortness of breath. Patient not taking: Reported on 03/23/2016 06/10/14   Le, Thao P, DO  amoxicillin (AMOXIL) 500 MG capsule Take 2 capsules (1,000 mg total) by mouth 2 (two) times daily. Patient not taking: Reported on 02/12/7792 04/17/99   Delora Fuel, MD  benzonatate (TESSALON) 100 MG capsule Take 2 capsules (200 mg total) by mouth 2 (two) times daily as needed for cough. Patient not taking: Reported on 07/02/2017 11/24/16   Nona Dell, PA-C  diazepam (VALIUM) 5 MG tablet Take 1 tablet (5 mg total) 2 (two) times daily by mouth. 07/02/17   Mabe, Forbes Cellar, MD  fluconazole (DIFLUCAN) 150 MG tablet Take 1 tablet (150 mg total) by mouth daily. Patient not taking:  Reported on 0/73/7106 2/69/48   Delora Fuel, MD  ibuprofen (ADVIL,MOTRIN) 800 MG tablet Take 1 tablet (800 mg total) by mouth every 8 (eight) hours as needed. Patient not taking: Reported on 07/02/2017 10/09/16   Ward, Ozella Almond, PA-C  naproxen (NAPROSYN) 500 MG tablet Take 1 tablet (500 mg total) 2 (two) times daily by mouth. 07/02/17   Mabe, Forbes Cellar, MD  ondansetron (ZOFRAN) 4 MG tablet Take 1 tablet (4 mg total) by mouth every 8 (eight) hours as needed for nausea or vomiting. Patient not taking: Reported on 03/23/2016 08/17/15   Sherian Maroon, MD  oxyCODONE-acetaminophen (PERCOCET/ROXICET) 5-325 MG tablet Take 1 tablet by mouth every 4 (four) hours as needed for severe pain. Patient not taking: Reported on 07/02/2017 03/23/16   Sam, Olivia Canter, PA-C  oxymetazoline (AFRIN NASAL  SPRAY) 0.05 % nasal spray Place 1 spray into both nostrils 2 (two) times daily. Spray once into each nostril twice daily for up to the next 3 days. Do not use for more than 3 days to prevent rebound rhinorrhea. Patient not taking: Reported on 07/02/2017 11/24/16   Nona Dell, PA-C  traMADol (ULTRAM) 50 MG tablet Take 1 tablet (50 mg total) by mouth every 6 (six) hours as needed. Patient not taking: Reported on 12/15/6268 3/50/09   Delora Fuel, MD    Family History Family History  Problem Relation Age of Onset  . Cancer Father        oral    Social History Social History   Tobacco Use  . Smoking status: Former Research scientist (life sciences)  . Smokeless tobacco: Never Used  Substance Use Topics  . Alcohol use: Yes    Comment: less than once a week-usually only holidays/special occasions  . Drug use: No     Allergies   Patient has no known allergies.   Review of Systems Review of Systems  ROS reviewed and all otherwise negative except for mentioned in HPI   Physical Exam Updated Vital Signs BP 114/73 (BP Location: Left Arm)   Pulse 76   Temp 98.3 F (36.8 C) (Oral)   Resp 16   Ht 5\' 4"  (1.626 m)   Wt 104.3 kg (230 lb)   SpO2 100%   BMI 39.48 kg/m  Vitals reviewed Physical Exam  Physical Examination: General appearance - alert, well appearing, and in no distress Mental status - alert, oriented to person, place, and time Eyes -no conjunctival injection, no scleral icterus Chest - clear to auscultation, no wheezes, rales or rhonchi, symmetric air entry Heart - normal rate, regular rhythm, normal S1, S2, no murmurs, rubs, clicks or gallops Abdomen - soft, nontender, nondistended, no masses or organomegaly Back exam - ttp over midline in lumbar spine, bilateral lumbar paraspinal tenderness Neurological - alert, oriented, normal speech. Strength 5/5 in lower extremities bilaterally, sensation intact Extremities - peripheral pulses normal, no pedal edema, no clubbing or  cyanosis Skin - normal coloration and turgor, no rashes   ED Treatments / Results  Labs (all labs ordered are listed, but only abnormal results are displayed) Labs Reviewed  URINALYSIS, ROUTINE W REFLEX MICROSCOPIC - Abnormal; Notable for the following components:      Result Value   APPearance HAZY (*)    All other components within normal limits  URINE CULTURE  POC URINE PREG, ED    EKG  EKG Interpretation None       Radiology Dg Lumbar Spine Complete  Result Date: 07/02/2017 CLINICAL DATA:  Low back pain. EXAM: LUMBAR SPINE -  COMPLETE 4+ VIEW COMPARISON:  09/16/2015 FINDINGS: There is slight narrowing of the L5-S1 disc space. Slight degenerative changes of the facet joints at L4-5 and L5-S1, unchanged. Alignment is normal. IMPRESSION: 1. Mild lower lumbar spondylosis. New slight narrowing of the L5-S1 disc space. 2. 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 Electronically Signed   By: Lorriane Shire M.D.   On: 07/02/2017 11:05    Procedures Procedures (including critical care time)  Medications Ordered in ED Medications  oxyCODONE-acetaminophen (PERCOCET/ROXICET) 5-325 MG per tablet 1 tablet (1 tablet Oral Given 07/02/17 0427)  ketorolac (TORADOL) injection 60 mg (60 mg Intramuscular Given 07/02/17 1036)  diazepam (VALIUM) tablet 5 mg (5 mg Oral Given 07/02/17 1035)     Initial Impression / Assessment and Plan / ED Course  I have reviewed the triage vital signs and the nursing notes.  Pertinent labs & imaging results that were available during my care of the patient were reviewed by me and considered in my medical decision making (see chart for details).     Pt presenting with bilateral low back pain, she does have some midline tenderness, xrays obtained.  These were reassuring, some chronic changes.  No signs or symptoms of cauda equina, no fever to suggest epidural abscess.  Pt feels improved after meds in the ED.  Discharged with strict return  precautions.  Pt agreeable with plan.  Final Clinical Impressions(s) / ED Diagnoses   Final diagnoses:  Bilateral low back pain without sciatica, unspecified chronicity    ED Discharge Orders        Ordered    diazepam (VALIUM) 5 MG tablet  2 times daily     07/02/17 1152    naproxen (NAPROSYN) 500 MG tablet  2 times daily     07/02/17 1152       Mabe, Forbes Cellar, MD 07/02/17 1236

## 2017-07-02 NOTE — ED Triage Notes (Signed)
Pt reports pain that started in her lower back and moved to her flank.  She is experiencing increase in urinary frequency and "pressure which I pee."  Pt attempted muscle relaxer's and Soma, w/ no effect.

## 2017-07-02 NOTE — ED Notes (Signed)
Called pt to re-assess vitals, pt did not answer.  

## 2017-07-02 NOTE — Discharge Instructions (Signed)
Return to the ED with any concerns including weakness of legs, not able to urinate, loss of control of bowel or bladder, decreased level of alertness/lethargy, or any other alarming symptoms °

## 2017-07-03 LAB — URINE CULTURE

## 2017-07-10 ENCOUNTER — Ambulatory Visit
Admission: RE | Admit: 2017-07-10 | Discharge: 2017-07-10 | Disposition: A | Discharge status: Home / Self Care | Payer: PRIVATE HEALTH INSURANCE | Source: Ambulatory Visit | Attending: Obstetrics and Gynecology | Admitting: Obstetrics and Gynecology

## 2017-07-10 ENCOUNTER — Other Ambulatory Visit: Payer: Self-pay | Admitting: Obstetrics and Gynecology

## 2017-07-10 DIAGNOSIS — N63 Unspecified lump in unspecified breast: Secondary | ICD-10-CM

## 2017-07-10 DIAGNOSIS — R599 Enlarged lymph nodes, unspecified: Secondary | ICD-10-CM

## 2017-07-10 DIAGNOSIS — N6320 Unspecified lump in the left breast, unspecified quadrant: Secondary | ICD-10-CM

## 2017-07-11 ENCOUNTER — Inpatient Hospital Stay: Admission: RE | Admit: 2017-07-11 | Payer: PRIVATE HEALTH INSURANCE | Source: Ambulatory Visit

## 2017-07-11 ENCOUNTER — Other Ambulatory Visit: Payer: Self-pay | Admitting: Obstetrics and Gynecology

## 2017-07-11 DIAGNOSIS — N6320 Unspecified lump in the left breast, unspecified quadrant: Secondary | ICD-10-CM

## 2017-07-12 ENCOUNTER — Ambulatory Visit
Admission: RE | Admit: 2017-07-12 | Discharge: 2017-07-12 | Disposition: A | Discharge status: Home / Self Care | Payer: PRIVATE HEALTH INSURANCE | Source: Ambulatory Visit | Attending: Obstetrics and Gynecology | Admitting: Obstetrics and Gynecology

## 2017-07-12 ENCOUNTER — Other Ambulatory Visit: Payer: Self-pay | Admitting: Obstetrics and Gynecology

## 2017-07-12 DIAGNOSIS — N6320 Unspecified lump in the left breast, unspecified quadrant: Secondary | ICD-10-CM

## 2017-07-12 DIAGNOSIS — R599 Enlarged lymph nodes, unspecified: Secondary | ICD-10-CM

## 2017-07-30 ENCOUNTER — Ambulatory Visit: Payer: Self-pay | Admitting: Surgery

## 2017-07-30 DIAGNOSIS — N6320 Unspecified lump in the left breast, unspecified quadrant: Secondary | ICD-10-CM

## 2017-07-30 NOTE — H&P (Signed)
Yesenia Wheeler Documented: 07/30/2017 9:23 AM Location: [facility] Patient #: [id number] DOB: 11-Apr-1971 Married / Language: English / Race: Black or African American Female  History of Present Illness Yesenia Moores A. Yesenia Lundahl MD; 07/30/2017 9:47 AM) Patient words: Patient sent at the request of Dr. Rosana Hoes for left breast mass. The patient went screening mammogram was found to have a new left breast mass measuring 1.6 cm. Core biopsy showed a complex sclerosing lesion. She also underwent a biopsy of an abnormal-appearing left axillary lymph node which was benign. She has a history of large pendulous painful breast. She also has back pain, neck pain and shoulder pain. She was evaluated for lateral breast reduction but there were some issues with her insurance and this could not be done. She denies any nipple discharge or breast mass. No history of cancer.                                Diagnosis 1. Breast, left, needle core biopsy, 2:30 o'clock - COMPLEX SCLEROSING LESION WITH CALCIFICATIONS AND USUAL DUCTAL HYPERPLASIA. 2. Lymph node, needle/core biopsy, left axilla - BENIGN LYMPH NODE. - NO METASTATIC CARCINOMA IDENTIFIED. Microscopic Comment 1. Called to [facility] on 07/13/2017. (JDP:ecj 07/13/2017)  CLINICAL DATA: 46 year old female presenting for delayed follow-up of a probably benign left breast mass. Of note, the patient states that within the past several months she has had new intermittent shooting pains in the lateral left breast.  EXAM: 2D DIGITAL DIAGNOSTIC UNILATERAL LEFT MAMMOGRAM WITH CAD AND ADJUNCT TOMO  LEFT BREAST ULTRASOUND  COMPARISON: Previous exam(s).  ACR Breast Density Category b: There are scattered areas of fibroglandular density.  FINDINGS: The mass previously noted in the left breast at 10 o'clock appears mammographically stable. There is however a new mass identified  in the lower outer quadrant of the left breast, posterior depth which measures up to 1.6 cm. No other suspicious calcifications, masses or areas of distortion are seen in the left breast.  Mammographic images were processed with CAD.  Ultrasound targeted to the left breast at 10 o'clock, 8 cm from the nipple demonstrates a stable oval circumscribed hypoechoic mass measuring 1.1 x 0.7 x 0.9 cm, previously measuring 0.9 x 0.8 x 0.8 cm.  Ultrasound of the left breast at 2:30, 15 cm from the nipple demonstrates a mixed cystic and solid mass measuring 1.9 x 0.8 x 1.0 cm. This likely corresponds with the new mass identified mammographically.  Additionally, there are innumerable smaller circumscribed oval masses identified in the upper-outer and lower-outer quadrants, with a few representative images acquired.  Ultrasound of the left axilla demonstrates 2 adjacent prominent lymph nodes. The largest has a cortex measuring up to 0.6 cm.  IMPRESSION: 1. The probably benign mass in the left breast at 10 o'clock is stable.  2. There is a new complex mass in the left breast at 230 measuring 1.9 cm.  3. There are 2 adjacent enlarged indeterminate left axillary lymph nodes.  4. Although there are numerous small benign-appearing masses seen in the limited ultrasound which was performed of the lateral aspect of the left breast, there are clearly numerous masses in the right breast as well seen on the prior mammogram. 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 Therefore follow-up of the adjacent multiple masses identified in the lateral left breast is not necessary.  RECOMMENDATION: 1. Ultrasound-guided biopsy is recommended  for the left breast mass at 2:30, and for the largest of the left axillary lymph nodes.  2. Note that the patient was not able to have the right breast mammogram until November 27th, and therefore still needs right breast  mammogram images. The should be performed the same day as her biopsy.  3. If the above biopsies are benign, the patient will need a 12 month follow-up bilateral diagnostic mammogram to ensure stability of the mass in the left breast at 10 o'clock. Alternatively, she could have this mass biopsied, in which case she would not require follow-up.  I have discussed the findings and recommendations with the patient. Results were also provided in writing at the conclusion of the visit. If applicable, a reminder letter will be sent to the patient regarding the next appointment.  BI-RADS CATEGORY 4: Suspicious.   CLINICAL DATA: Patient status post ultrasound-guided core needle biopsy left breast mass and left axillary lymph node. EXAM: DIAGNOSTIC LEFT MAMMOGRAM POST ULTRASOUND BIOPSY COMPARISON: Previous exam(s). FINDINGS: Mammographic images were obtained following ultrasound guided biopsy of left breast mass and left axillary lymph node. Ribbon shaped marking clip in appropriate position within the upper-outer left breast at the site of biopsied mass. HydroMARK clip within the left axilla in appropriate position. IMPRESSION: Appropriate position ribbon shaped marking clip and HydroMARK clip status post ultrasound-guided core needle biopsy left breast mass and left axillary lymph node. Final Assessment: Post Procedure Mammograms for Marker Placement Electronically Signed By: Lovey Newcomer M.D. On: 07/12/2017 11:17 .  The patient is a 46 year old female.   Past Surgical History (Tanisha A. Owens Shark, [place]; 07/30/2017 9:23 AM) Breast Biopsy Left. Cesarean Section - Multiple Gallbladder Surgery - Laparoscopic Gastric Bypass Knee Surgery Right. Shoulder Surgery Left.  Diagnostic Studies History (Tanisha A. Owens Shark, [place]; 07/30/2017 9:23 AM) Colonoscopy never Mammogram within last year Pap Smear 1-5 years ago  Allergies (Tanisha A. Owens Shark, [place]; 07/30/2017 9:24 AM) No Known  Drug Allergies [07/30/2017]: Allergies Reconciled  Medication History (Tanisha A. Owens Shark, [place]; 07/30/2017 9:26 AM) Albuterol (90MCG/ACT Aerosol Soln, Inhalation) Active. Tessalon Perles (100MG  Capsule, Oral) Active. Carisoprodol (350MG  Tablet, Oral) Active. Valium (5MG  Tablet, Oral) Active. Fluconazole (150MG  Tablet, Oral) Active. Ibuprofen (800MG  Tablet, Oral) Active. Naproxen (500MG  Tablet, Oral) Active. Ondansetron (4MG  Tablet Disint, Oral) Active. Zoloft (25MG  Tablet, Oral) Active. TraMADol HCl (50MG  Tablet, Oral) Active. Medications Reconciled  Social History (Tanisha A. Owens Shark, [place]; 07/30/2017 9:23 AM) Alcohol use Occasional alcohol use. Caffeine use Coffee. No drug use Tobacco use Former smoker.  Family History (Tanisha A. Owens Shark, [place]; 07/30/2017 9:23 AM) Alcohol Abuse Father. Arthritis Mother. Cancer Father. Depression Mother. Heart disease in female family member before age 75  Pregnancy / Birth History (Tanisha A. Owens Shark, RMA; 07/30/2017 9:23 AM) Age at menarche 49 years. Gravida 10 Length (months) of breastfeeding 3-6 Maternal age 78-25 Para 2 Regular periods  Other Problems (Tanisha A. Owens Shark, [place]; 07/30/2017 9:23 AM) Anxiety Disorder Arthritis Asthma Back Pain Depression Hemorrhoids Lump In Breast Migraine Headache     Review of Systems (Tanisha A. Brown RMA; 07/30/2017 9:23 AM) General Present- Fatigue. Not Present- Appetite Loss, Chills, Fever, Night Sweats, Weight Gain and Weight Loss. Skin Present- Dryness. Not Present- Change in Wart/Mole, Hives, Jaundice, New Lesions, Non-Healing Wounds, Rash and Ulcer. HEENT Present- Wears glasses/contact lenses. Not Present- Earache, Hearing Loss, Hoarseness, Nose Bleed, Oral Ulcers, Ringing in the Ears, Seasonal Allergies, Sinus Pain, Sore Throat, Visual Disturbances and Yellow Eyes. Respiratory Not Present- Bloody sputum, Chronic Cough, Difficulty Breathing, Snoring and  Wheezing. Breast Present- Breast Mass and Breast Pain. Not Present- Nipple Discharge and Skin Changes. Cardiovascular Not Present- Chest Pain, Difficulty Breathing Lying Down, Leg Cramps, Palpitations, Rapid Heart Rate, Shortness of Breath and Swelling of Extremities. Gastrointestinal Present- Hemorrhoids. Not Present- Abdominal Pain, Bloating, Bloody Stool, Change in Bowel Habits, Chronic diarrhea, Constipation, Difficulty Swallowing, Excessive gas, Gets full quickly at meals, Indigestion, Nausea, Rectal Pain and Vomiting. Female Genitourinary Not Present- Frequency, Nocturia, Painful Urination, Pelvic Pain and Urgency. Musculoskeletal Present- Back Pain, Joint Pain, Joint Stiffness and Swelling of Extremities. Not Present- Muscle Pain and Muscle Weakness. Neurological Present- Headaches. Not Present- Decreased Memory, Fainting, Numbness, Seizures, Tingling, Tremor, Trouble walking and Weakness. Psychiatric Present- Change in Sleep Pattern and Depression. Not Present- Anxiety, Bipolar, Fearful and Frequent crying. Endocrine Not Present- Cold Intolerance, Excessive Hunger, Hair Changes, Heat Intolerance, Hot flashes and New Diabetes. Hematology Not Present- Easy Bruising, Excessive bleeding, Gland problems, HIV and Persistent Infections.  Vitals (Tanisha A. Brown RMA; 07/30/2017 9:24 AM) 07/30/2017 9:24 AM Weight: 234 lb Height: 63in Body Surface Area: 2.07 m Body Mass Index: 41.45 kg/m  Temp.: 98.16F  Pulse: 78 (Regular)  BP: 128/84 (Sitting, Left Arm, Standard)      Physical Exam (Decoda Van A. Shaneika Rossa MD; 07/30/2017 9:48 AM)  General Mental Status-Alert. General Appearance-Consistent with stated age. Hydration-Well hydrated. Voice-Normal.  Head and Neck Head-normocephalic, atraumatic with no lesions or palpable masses. Trachea-midline. Thyroid Gland Characteristics - normal size and consistency.  Chest and Lung Exam Chest and lung exam reveals -quiet,  even and easy respiratory effort with no use of accessory muscles and on auscultation, normal breath sounds, no adventitious sounds and normal vocal resonance. Inspection Chest Wall - Normal. Back - normal.  Breast Note: Large pendulous breasts. No masses in either breast. Both nipples appeared normal.  Cardiovascular Cardiovascular examination reveals -normal heart sounds, regular rate and rhythm with no murmurs and normal pedal pulses bilaterally.  Neurologic Neurologic evaluation reveals -alert and oriented x 3 with no impairment of recent or remote memory. Mental Status-Normal.  Lymphatic Head & Neck  General Head & Neck Lymphatics: Bilateral - Description - Normal. Axillary  General Axillary Region: Bilateral - Description - Normal. Tenderness - Non Tender.    Assessment & Plan (Rahul Malinak A. Syrita Dovel MD; 07/30/2017 9:51 AM)  LEFT BREAST MASS (N63.20) Impression: Recommend left breast seed Risk of lumpectomy include bleeding, infection, seroma, more surgery, use of seed/wire, wound care, cosmetic deformity and the need for other treatments, death , blood clots, death. Pt agrees to proceed. localized lumpectomy for complex sclerosing rate lesion due to low but present risk of malignancy in this situation. The patient would benefit from bilateral breast reduction and I will refer to plastics to see if this can be done concomitantly.  Current Plans You are being scheduled for surgery- Our schedulers will call you.  You should hear from our office's scheduling department within 5 working days about the location, date, and time of surgery. We try to make accommodations for patient's preferences in scheduling surgery, but sometimes the OR schedule or the surgeon's schedule prevents [place] from making those accommodations.  If you have not heard from our office [phone]) in 5 working days, call the office and ask for your surgeon's nurse.  If you have other questions about  your diagnosis, plan, or surgery, call the office and ask for your surgeon's nurse.  Pt Education - [facility] Breast Biopsy HCI: discussed with patient and provided information. The anatomy and the physiology was discussed. The  pathophysiology and natural history of the disease was discussed. Options were discussed and recommendations were made. Technique, risks, benefits, & alternatives were discussed. Risks such as stroke, heart attack, bleeding, indection, death, and other risks discussed. Questions answered. The patient agrees to proceed.  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

## 2017-08-08 ENCOUNTER — Other Ambulatory Visit: Payer: Self-pay

## 2017-08-08 ENCOUNTER — Emergency Department (HOSPITAL_COMMUNITY)
Admission: EM | Admit: 2017-08-08 | Discharge: 2017-08-08 | Disposition: A | Discharge status: Home / Self Care | Payer: PRIVATE HEALTH INSURANCE | Attending: Emergency Medicine | Admitting: Emergency Medicine

## 2017-08-08 ENCOUNTER — Encounter (HOSPITAL_COMMUNITY): Payer: Self-pay | Admitting: *Deleted

## 2017-08-08 DIAGNOSIS — Z87891 Personal history of nicotine dependence: Secondary | ICD-10-CM | POA: Insufficient documentation

## 2017-08-08 DIAGNOSIS — Z79899 Other long term (current) drug therapy: Secondary | ICD-10-CM | POA: Insufficient documentation

## 2017-08-08 DIAGNOSIS — J45909 Unspecified asthma, uncomplicated: Secondary | ICD-10-CM | POA: Insufficient documentation

## 2017-08-08 DIAGNOSIS — Z9884 Bariatric surgery status: Secondary | ICD-10-CM | POA: Insufficient documentation

## 2017-08-08 DIAGNOSIS — M25562 Pain in left knee: Secondary | ICD-10-CM | POA: Diagnosis not present

## 2017-08-08 DIAGNOSIS — G8929 Other chronic pain: Secondary | ICD-10-CM | POA: Diagnosis not present

## 2017-08-08 MED ORDER — TRAMADOL HCL 50 MG PO TABS: 50.0000 mg | ORAL_TABLET | Freq: Four times a day (QID) | ORAL | 0 refills | Status: DC | PRN

## 2017-08-08 MED ORDER — OXYCODONE-ACETAMINOPHEN 5-325 MG PO TABS
1.0000 | ORAL_TABLET | Freq: Once | ORAL | Status: AC
  Administered 2017-08-08: 1 via ORAL
  Filled 2017-08-08: qty 1

## 2017-08-08 NOTE — ED Provider Notes (Signed)
[place] EMERGENCY DEPARTMENT Provider Note   CSN: [id number] Arrival date & time: 08/08/17  9381     History   Chief Complaint Chief Complaint  Patient presents with  . Knee Pain    HPI Yesenia Wheeler is a 46 y.o. female who presents to ED for evaluation of gradually worsening left knee pain over the past week.  States that she was seen and evaluated by an orthopedic specialist 6 months ago and was told she had a torn medial meniscus.  She is scheduled for surgery in 2 weeks.  States that since she has been working more around the holidays she has had worsening of the aching pain of her left knee.  States pain worse with ambulation is causing her to have a limp.  She denies any numbness in legs, chest pain, shortness of breath, hemoptysis, red hot or tender joint, fevers history of infected joint.  HPI  Past Medical History:  Diagnosis Date  . Anemia   . Asthma   . Bone spur 2008  . 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 Migraines     Patient Active Problem List   Diagnosis Date Noted  . BV (bacterial vaginosis) 06/23/2015  . Tinea pedis 06/21/2015  . Screening-pulmonary TB 03/15/2015  . Obesity (BMI 30-39.9) 05/26/2014  . Asthma, chronic 05/25/2014  . Migraine 05/25/2014  . Chronic back pain 05/25/2014    Past Surgical History:  Procedure Laterality Date  . CESAREAN SECTION    . CHOLECYSTECTOMY    . GASTRIC BYPASS    . KNEE SURGERY    . SHOULDER SURGERY Left 2008   Bone Spur removal    OB History    Gravida Para Term Preterm AB Living   13       13 3    SAB TAB Ectopic Multiple Live Births   13               Home Medications    Prior to Admission medications   Medication Sig Start Date End Date Taking? Authorizing Provider  albuterol (PROVENTIL HFA;VENTOLIN HFA) 108 (90 BASE) MCG/ACT inhaler Inhale 2 puffs into the lungs every 6 (six) hours as  needed for wheezing. Patient not taking: Reported on 03/23/2016 06/10/14   Le, Thao P, DO  albuterol (PROVENTIL) (2.5 MG/3ML) 0.083% nebulizer solution Take 3 mLs (2.5 mg total) by nebulization every 4 (four) hours as needed for wheezing or shortness of breath. Patient not taking: Reported on 03/23/2016 06/10/14   Le, Thao P, DO  amoxicillin (AMOXIL) 500 MG capsule Take 2 capsules (1,000 mg total) by mouth 2 (two) times daily. Patient not taking: Reported on 04/11/9370 6/96/78   Delora Fuel, MD  benzonatate (TESSALON) 100 MG capsule Take 2 capsules (200 mg total) by mouth 2 (two) times daily as needed for cough. Patient not taking: Reported on 07/02/2017 11/24/16   Nona Dell, PA-C  carisoprodol (SOMA) 350 MG tablet Take 350 mg 3 (three) times daily by mouth.    [provider]  diazepam (VALIUM) 5 MG tablet Take 1 tablet (5 mg total) 2 (two) times daily by mouth. 07/02/17   Mabe, Forbes Cellar, MD  fluconazole (DIFLUCAN) 150 MG tablet Take 1 tablet (150 mg total) by mouth daily. Patient not taking: Reported on 9/38/1017 12/22/23   Delora Fuel, MD  ibuprofen (ADVIL,MOTRIN) 800 MG tablet Take 1 tablet (800 mg total)  by mouth every 8 (eight) hours as needed. Patient not taking: Reported on 07/02/2017 10/09/16   Ward, Ozella Almond, PA-C  naproxen (NAPROSYN) 500 MG tablet Take 1 tablet (500 mg total) 2 (two) times daily by mouth. 07/02/17   Mabe, Forbes Cellar, MD  ondansetron (ZOFRAN) 4 MG tablet Take 1 tablet (4 mg total) by mouth every 8 (eight) hours as needed for nausea or vomiting. Patient not taking: Reported on 03/23/2016 08/17/15   Sherian Maroon, MD  oxyCODONE-acetaminophen (PERCOCET/ROXICET) 5-325 MG tablet Take 1 tablet by mouth every 4 (four) hours as needed for severe pain. Patient not taking: Reported on 07/02/2017 03/23/16   Sam, Olivia Canter, PA-C  oxymetazoline (AFRIN NASAL SPRAY) 0.05 % nasal spray Place 1 spray into both nostrils 2 (two) times daily. Spray once into each nostril  twice daily for up to the next 3 days. Do not use for more than 3 days to prevent rebound rhinorrhea. Patient not taking: Reported on 07/02/2017 11/24/16   Nona Dell, PA-C  sertraline (ZOLOFT) 25 MG tablet Take 25 mg daily by mouth.    [provider]  traMADol (ULTRAM) 50 MG tablet Take 1 tablet (50 mg total) by mouth every 6 (six) hours as needed. 08/08/17   Sheneka Schrom, PA-C  traZODone (DESYREL) 100 MG tablet Take 100 mg at bedtime as needed by mouth for sleep.    [provider]    Family History Family History  Problem Relation Age of Onset  . Cancer Father        oral    Social History Social History   Tobacco Use  . Smoking status: Former Research scientist (life sciences)  . Smokeless tobacco: Never Used  Substance Use Topics  . Alcohol use: Yes    Comment: less than once a week-usually only holidays/special occasions  . Drug use: No     Allergies   Patient has no known allergies.   Review of Systems Review of Systems  Constitutional: Negative for chills and fever.  Gastrointestinal: Negative for nausea and vomiting.  Musculoskeletal: Positive for arthralgias, gait problem and joint swelling.  Skin: Negative for color change, rash and wound.  Neurological: Negative for weakness and numbness.     Physical Exam Updated Vital Signs BP 122/85 (BP Location: Right Arm)   Pulse 66   Temp 98.2 F (36.8 C) (Oral)   Resp 20   LMP 07/11/2017 (Exact Date)   SpO2 100%   Physical Exam  Constitutional: She appears well-developed and well-nourished. No distress.  Nontoxic appearing and in no acute distress.  HENT:  Head: Normocephalic and atraumatic.  Eyes: Conjunctivae and EOM are normal. No scleral icterus.  Neck: Normal range of motion.  Pulmonary/Chest: Effort normal. No respiratory distress.  Musculoskeletal: Normal range of motion. She exhibits tenderness. She exhibits no edema or deformity.  Tenderness to palpation at the medial aspect of the left  patella.  No visible edema, color or temperature change noted.  Able to perform full active and passive range of motion of the knee although reports is painful.  2+ DP pulse noted.  Sensation intact to light touch of bilateral lower extremities.  Strength 5/5 of bilateral lower extremities.  No calf tenderness or popliteal tenderness noted.  Neurological: She is alert.  Skin: No rash noted. She is not diaphoretic.  Psychiatric: She has a normal mood and affect.  Nursing note and vitals reviewed.    ED Treatments / Results  Labs (all labs ordered are listed, but only abnormal results are  displayed) Labs Reviewed - No data to display  EKG  EKG Interpretation None       Radiology No results found.  Procedures Procedures (including critical care time)  Medications Ordered in ED Medications  oxyCODONE-acetaminophen (PERCOCET/ROXICET) 5-325 MG per tablet 1 tablet (1 tablet Oral Given 08/08/17 1237)     Initial Impression / Assessment and Plan / ED Course  I have reviewed the triage vital signs and the nursing notes.  Pertinent labs & imaging results that were available during my care of the patient were reviewed by me and considered in my medical decision making (see chart for details).     The patient presents to ED for evaluation of gradually worsening left knee pain.  She was diagnosed with a torn medial meniscus 6 months ago and is scheduled for surgery in 2 weeks.  She states that because she has been working ambulating more frequently due to the holidays, she has had progressive worsening in her symptoms.  She has tried her home naproxen with no improvement in her symptoms.  Denies any reinjuries, redness or temperature change of joint, numbness in legs or fever.  On physical exam she is overall well-appearing.  She does have tenderness to palpation of the medial aspect of the left knee with no visible deformity, edema, color or temperature change noted.  She has no erythema or  pallor noted.  2+ DP pulse noted bilaterally.  Strength 5/5 and able to perform full active and passive range of motion although reports is painful.  I informed patient that she does need to follow-up with her orthopedic specialist for any further evaluation.  Will give short course of pain medication and knee sleeve for support.  I have low suspicion for DVT or other vascular cause of her pain.  Patient appears stable for discharge at this time.  Strict return precautions given.  Final Clinical Impressions(s) / ED Diagnoses   Final diagnoses:  Chronic pain of left knee    ED Discharge Orders        Ordered    traMADol (ULTRAM) 50 MG tablet  Every 6 hours PRN     08/08/17 1236     Portions of this note were generated with Dragon dictation software. Dictation errors may occur despite best attempts at proofreading.    Delia Heady, PA-C 08/08/17 1239    Forde Dandy, MD 08/08/17 [phone]

## 2017-08-08 NOTE — ED Triage Notes (Signed)
Pt has torn meniscus in left knee and was on it a lot yesterday.  No she cannot put any pressure on leg.  Pt states its burning and shooting down from knee.

## 2017-08-08 NOTE — Discharge Instructions (Signed)
Please read attached information regarding your condition. Take tramadol as needed for pain.  Continue your home naproxen as needed for pain. Wear knee sleeve as directed. Follow-up with orthopedic specialist for further evaluation. Return to ED for worsening symptoms, red hot or tender joint with fevers, numbness in legs, injuries or falls.

## 2017-08-08 NOTE — ED Notes (Signed)
Patient verbalized understanding of discharge instructions and denies any further needs or questions at this time. VS stable. Patient ambulatory with steady gait. Assisted to ED entrance in wheelchair.   

## 2017-08-23 ENCOUNTER — Encounter (HOSPITAL_BASED_OUTPATIENT_CLINIC_OR_DEPARTMENT_OTHER): Payer: Self-pay | Admitting: *Deleted

## 2017-08-23 ENCOUNTER — Other Ambulatory Visit: Payer: Self-pay

## 2017-08-23 NOTE — Progress Notes (Signed)
SPOKE W/ PT VIA PHONE FOR PRE-OP INTERVIEW.  NPO AFTER MN.  NEEDS HG AND URINE PREG.  WILL TAKE AM MEDS W/ SIPS OF WATER.

## 2017-08-29 MED ORDER — DEXTROSE 5 % IV SOLN
3.0000 g | INTRAVENOUS | Status: AC
  Administered 2017-08-30: 3 g via INTRAVENOUS
  Filled 2017-08-29: qty 3000

## 2017-08-30 ENCOUNTER — Encounter (HOSPITAL_BASED_OUTPATIENT_CLINIC_OR_DEPARTMENT_OTHER): Payer: Self-pay

## 2017-08-30 ENCOUNTER — Ambulatory Visit (HOSPITAL_BASED_OUTPATIENT_CLINIC_OR_DEPARTMENT_OTHER): Payer: BLUE CROSS/BLUE SHIELD | Admitting: Anesthesiology

## 2017-08-30 ENCOUNTER — Encounter (HOSPITAL_BASED_OUTPATIENT_CLINIC_OR_DEPARTMENT_OTHER)
Admission: RE | Disposition: A | Discharge status: Home / Self Care | Payer: Self-pay | Source: Ambulatory Visit | Attending: Orthopedic Surgery

## 2017-08-30 ENCOUNTER — Ambulatory Visit (HOSPITAL_BASED_OUTPATIENT_CLINIC_OR_DEPARTMENT_OTHER)
Admission: RE | Admit: 2017-08-30 | Discharge: 2017-08-30 | Disposition: A | Discharge status: Home / Self Care | Payer: BLUE CROSS/BLUE SHIELD | Source: Ambulatory Visit | Attending: Orthopedic Surgery | Admitting: Orthopedic Surgery

## 2017-08-30 DIAGNOSIS — S83282A Other tear of lateral meniscus, current injury, left knee, initial encounter: Secondary | ICD-10-CM | POA: Diagnosis not present

## 2017-08-30 DIAGNOSIS — M94262 Chondromalacia, left knee: Secondary | ICD-10-CM | POA: Insufficient documentation

## 2017-08-30 DIAGNOSIS — S83242A Other tear of medial meniscus, current injury, left knee, initial encounter: Secondary | ICD-10-CM | POA: Insufficient documentation

## 2017-08-30 DIAGNOSIS — Z6841 Body Mass Index (BMI) 40.0 and over, adult: Secondary | ICD-10-CM | POA: Diagnosis not present

## 2017-08-30 DIAGNOSIS — J45909 Unspecified asthma, uncomplicated: Secondary | ICD-10-CM | POA: Diagnosis not present

## 2017-08-30 DIAGNOSIS — Z86718 Personal history of other venous thrombosis and embolism: Secondary | ICD-10-CM | POA: Insufficient documentation

## 2017-08-30 DIAGNOSIS — M659 Synovitis and tenosynovitis, unspecified: Secondary | ICD-10-CM | POA: Insufficient documentation

## 2017-08-30 DIAGNOSIS — Z87891 Personal history of nicotine dependence: Secondary | ICD-10-CM | POA: Diagnosis not present

## 2017-08-30 DIAGNOSIS — X58XXXA Exposure to other specified factors, initial encounter: Secondary | ICD-10-CM | POA: Insufficient documentation

## 2017-08-30 DIAGNOSIS — Z9884 Bariatric surgery status: Secondary | ICD-10-CM | POA: Diagnosis not present

## 2017-08-30 DIAGNOSIS — N6022 Fibroadenosis of left breast: Secondary | ICD-10-CM | POA: Insufficient documentation

## 2017-08-30 DIAGNOSIS — M170 Bilateral primary osteoarthritis of knee: Secondary | ICD-10-CM | POA: Insufficient documentation

## 2017-08-30 DIAGNOSIS — Z800 Family history of malignant neoplasm of digestive organs: Secondary | ICD-10-CM | POA: Insufficient documentation

## 2017-08-30 DIAGNOSIS — Z79899 Other long term (current) drug therapy: Secondary | ICD-10-CM | POA: Insufficient documentation

## 2017-08-30 DIAGNOSIS — N6320 Unspecified lump in the left breast, unspecified quadrant: Secondary | ICD-10-CM

## 2017-08-30 DIAGNOSIS — Z9889 Other specified postprocedural states: Secondary | ICD-10-CM | POA: Diagnosis not present

## 2017-08-30 HISTORY — DX: Unspecified tear of unspecified meniscus, current injury, left knee, initial encounter: S83.207A

## 2017-08-30 HISTORY — DX: Unspecified osteoarthritis, unspecified site: M19.90

## 2017-08-30 HISTORY — PX: KNEE ARTHROSCOPY WITH MEDIAL MENISECTOMY: SHX5651

## 2017-08-30 HISTORY — DX: Unspecified lump in the left breast, unspecified quadrant: N63.20

## 2017-08-30 HISTORY — DX: Personal history of other venous thrombosis and embolism: Z86.718

## 2017-08-30 HISTORY — DX: Unspecified asthma, uncomplicated: J45.909

## 2017-08-30 LAB — POCT PREGNANCY, URINE: Preg Test, Ur: NEGATIVE

## 2017-08-30 SURGERY — ARTHROSCOPY, KNEE, WITH MEDIAL MENISCECTOMY
Anesthesia: General | Laterality: Left

## 2017-08-30 MED ORDER — HYDROCODONE-ACETAMINOPHEN 5-325 MG PO TABS
ORAL_TABLET | ORAL | Status: AC
  Filled 2017-08-30: qty 1

## 2017-08-30 MED ORDER — MIDAZOLAM HCL 5 MG/5ML IJ SOLN
INTRAMUSCULAR | Status: DC | PRN
  Administered 2017-08-30: 2 mg via INTRAVENOUS

## 2017-08-30 MED ORDER — GABAPENTIN 300 MG PO CAPS
300.0000 mg | ORAL_CAPSULE | ORAL | Status: DC
  Filled 2017-08-30: qty 1

## 2017-08-30 MED ORDER — KETOROLAC TROMETHAMINE 30 MG/ML IJ SOLN
INTRAMUSCULAR | Status: AC
  Filled 2017-08-30: qty 1

## 2017-08-30 MED ORDER — METOCLOPRAMIDE HCL 5 MG/ML IJ SOLN
10.0000 mg | Freq: Once | INTRAMUSCULAR | Status: DC | PRN
  Filled 2017-08-30: qty 2

## 2017-08-30 MED ORDER — CHLORHEXIDINE GLUCONATE CLOTH 2 % EX PADS
6.0000 | MEDICATED_PAD | Freq: Once | CUTANEOUS | Status: DC
  Filled 2017-08-30: qty 6

## 2017-08-30 MED ORDER — DEXAMETHASONE SODIUM PHOSPHATE 4 MG/ML IJ SOLN
INTRAMUSCULAR | Status: DC | PRN
  Administered 2017-08-30: 10 mg via INTRAVENOUS

## 2017-08-30 MED ORDER — LACTATED RINGERS IV SOLN
INTRAVENOUS | Status: DC
  Filled 2017-08-30: qty 1000

## 2017-08-30 MED ORDER — DEXAMETHASONE SODIUM PHOSPHATE 10 MG/ML IJ SOLN
INTRAMUSCULAR | Status: AC
  Filled 2017-08-30: qty 1

## 2017-08-30 MED ORDER — FENTANYL CITRATE (PF) 100 MCG/2ML IJ SOLN
INTRAMUSCULAR | Status: AC
  Filled 2017-08-30: qty 2

## 2017-08-30 MED ORDER — MIDAZOLAM HCL 2 MG/2ML IJ SOLN
INTRAMUSCULAR | Status: AC
  Filled 2017-08-30: qty 2

## 2017-08-30 MED ORDER — PROPOFOL 10 MG/ML IV BOLUS
INTRAVENOUS | Status: DC | PRN
  Administered 2017-08-30: 30 mg via INTRAVENOUS
  Administered 2017-08-30: 200 mg via INTRAVENOUS

## 2017-08-30 MED ORDER — KETOROLAC TROMETHAMINE 30 MG/ML IJ SOLN
INTRAMUSCULAR | Status: DC | PRN
  Administered 2017-08-30: 30 mg via INTRAVENOUS

## 2017-08-30 MED ORDER — ONDANSETRON HCL 4 MG/2ML IJ SOLN
INTRAMUSCULAR | Status: AC
  Filled 2017-08-30: qty 2

## 2017-08-30 MED ORDER — HYDROCODONE-ACETAMINOPHEN 5-325 MG PO TABS: 1.0000 | ORAL_TABLET | Freq: Four times a day (QID) | ORAL | 0 refills | Status: DC | PRN

## 2017-08-30 MED ORDER — MEPERIDINE HCL 25 MG/ML IJ SOLN
6.2500 mg | INTRAMUSCULAR | Status: DC | PRN
  Administered 2017-08-30: 6.25 mg via INTRAVENOUS
  Filled 2017-08-30: qty 1

## 2017-08-30 MED ORDER — PROPOFOL 10 MG/ML IV BOLUS
INTRAVENOUS | Status: AC
  Filled 2017-08-30: qty 40

## 2017-08-30 MED ORDER — CHLORHEXIDINE GLUCONATE 4 % EX LIQD
60.0000 mL | Freq: Once | CUTANEOUS | Status: DC
  Filled 2017-08-30: qty 118

## 2017-08-30 MED ORDER — ONDANSETRON HCL 4 MG/2ML IJ SOLN
INTRAMUSCULAR | Status: DC | PRN
  Administered 2017-08-30: 4 mg via INTRAVENOUS

## 2017-08-30 MED ORDER — CEFAZOLIN SODIUM-DEXTROSE 2-4 GM/100ML-% IV SOLN
2.0000 g | INTRAVENOUS | Status: DC
  Filled 2017-08-30: qty 100

## 2017-08-30 MED ORDER — FENTANYL CITRATE (PF) 100 MCG/2ML IJ SOLN
INTRAMUSCULAR | Status: DC | PRN
  Administered 2017-08-30 (×2): 50 ug via INTRAVENOUS

## 2017-08-30 MED ORDER — FENTANYL CITRATE (PF) 100 MCG/2ML IJ SOLN
25.0000 ug | INTRAMUSCULAR | Status: DC | PRN
  Administered 2017-08-30 (×3): 25 ug via INTRAVENOUS
  Filled 2017-08-30: qty 1

## 2017-08-30 MED ORDER — ONDANSETRON 4 MG PO TBDP: 4.0000 mg | ORAL_TABLET | Freq: Three times a day (TID) | ORAL | 0 refills | Status: DC | PRN

## 2017-08-30 MED ORDER — ARTIFICIAL TEARS OPHTHALMIC OINT
TOPICAL_OINTMENT | OPHTHALMIC | Status: AC
  Filled 2017-08-30: qty 3.5

## 2017-08-30 MED ORDER — LACTATED RINGERS IV SOLN
INTRAVENOUS | Status: DC
  Administered 2017-08-30: 1000 mL via INTRAVENOUS
  Administered 2017-08-30: 08:00:00 via INTRAVENOUS
  Filled 2017-08-30: qty 1000

## 2017-08-30 MED ORDER — CEFAZOLIN SODIUM-DEXTROSE 2-4 GM/100ML-% IV SOLN
INTRAVENOUS | Status: AC
  Filled 2017-08-30: qty 100

## 2017-08-30 MED ORDER — BUPIVACAINE HCL 0.25 % IJ SOLN
INTRAMUSCULAR | Status: DC | PRN
  Administered 2017-08-30: 20 mL

## 2017-08-30 MED ORDER — METOCLOPRAMIDE HCL 5 MG/ML IJ SOLN
INTRAMUSCULAR | Status: AC
  Filled 2017-08-30: qty 2

## 2017-08-30 MED ORDER — ACETAMINOPHEN 500 MG PO TABS
1000.0000 mg | ORAL_TABLET | ORAL | Status: DC
  Filled 2017-08-30: qty 2

## 2017-08-30 MED ORDER — MEPERIDINE HCL 25 MG/ML IJ SOLN
INTRAMUSCULAR | Status: AC
Start: 2017-08-30 — End: 2017-08-30
  Filled 2017-08-30: qty 1

## 2017-08-30 MED ORDER — LIDOCAINE 2% (20 MG/ML) 5 ML SYRINGE
INTRAMUSCULAR | Status: AC
  Filled 2017-08-30: qty 5

## 2017-08-30 MED ORDER — CEFAZOLIN SODIUM-DEXTROSE 1-4 GM/50ML-% IV SOLN
INTRAVENOUS | Status: AC
Start: 2017-08-30 — End: ?
  Filled 2017-08-30: qty 50

## 2017-08-30 MED ORDER — LIDOCAINE 2% (20 MG/ML) 5 ML SYRINGE
INTRAMUSCULAR | Status: DC | PRN
  Administered 2017-08-30: 100 mg via INTRAVENOUS

## 2017-08-30 MED ORDER — METOCLOPRAMIDE HCL 5 MG/ML IJ SOLN
INTRAMUSCULAR | Status: DC | PRN
  Administered 2017-08-30: 10 mg via INTRAVENOUS

## 2017-08-30 MED ORDER — HYDROCODONE-ACETAMINOPHEN 5-325 MG PO TABS
1.0000 | ORAL_TABLET | Freq: Four times a day (QID) | ORAL | Status: DC | PRN
  Administered 2017-08-30: 1 via ORAL
  Filled 2017-08-30: qty 2

## 2017-08-30 SURGICAL SUPPLY — 38 items
BANDAGE ELASTIC 6 VELCRO ST LF (GAUZE/BANDAGES/DRESSINGS) ×2 IMPLANT
BANDAGE ESMARK 6X9 LF (GAUZE/BANDAGES/DRESSINGS) IMPLANT
BLADE CUDA GRT WHITE 3.5 (BLADE) IMPLANT
BLADE CUTTER GATOR 3.5 (BLADE) IMPLANT
BLADE GREAT WHITE 4.2 (BLADE) IMPLANT
BNDG ESMARK 6X9 LF (GAUZE/BANDAGES/DRESSINGS)
CUFF TOURNIQUET SINGLE 34IN LL (TOURNIQUET CUFF) IMPLANT
DRAPE ARTHROSCOPY W/POUCH 114 (DRAPES) ×2 IMPLANT
DRAPE U-SHAPE 47X51 STRL (DRAPES) ×2 IMPLANT
DRSG PAD ABDOMINAL 8X10 ST (GAUZE/BANDAGES/DRESSINGS) ×2 IMPLANT
DURAPREP 26ML APPLICATOR (WOUND CARE) ×2 IMPLANT
GAUZE SPONGE 4X4 12PLY STRL (GAUZE/BANDAGES/DRESSINGS) ×2 IMPLANT
GAUZE SPONGE 4X4 12PLY STRL LF (GAUZE/BANDAGES/DRESSINGS) ×2 IMPLANT
GAUZE XEROFORM 1X8 LF (GAUZE/BANDAGES/DRESSINGS) ×2 IMPLANT
GLOVE BIO SURGEON STRL SZ7.5 (GLOVE) ×2 IMPLANT
GLOVE BIOGEL PI IND STRL 8 (GLOVE) ×1 IMPLANT
GLOVE BIOGEL PI INDICATOR 8 (GLOVE) ×1
GOWN STRL REUS W/TWL LRG LVL3 (GOWN DISPOSABLE) IMPLANT
IV NS IRRIG 3000ML ARTHROMATIC (IV SOLUTION) ×4 IMPLANT
KIT RM TURNOVER CYSTO AR (KITS) ×2 IMPLANT
KNEE WRAP E Z 3 GEL PACK (MISCELLANEOUS) ×2 IMPLANT
MANIFOLD NEPTUNE II (INSTRUMENTS) ×2 IMPLANT
PACK ARTHROSCOPY DSU (CUSTOM PROCEDURE TRAY) ×2 IMPLANT
PACK BASIN DAY SURGERY FS (CUSTOM PROCEDURE TRAY) ×2 IMPLANT
PAD ABD 8X10 STRL (GAUZE/BANDAGES/DRESSINGS) ×2 IMPLANT
PAD ARMBOARD 7.5X6 YLW CONV (MISCELLANEOUS) IMPLANT
PADDING CAST COTTON 6X4 STRL (CAST SUPPLIES) ×2 IMPLANT
PROBE BIPOLAR 50 DEGREE SUCT (MISCELLANEOUS) ×2 IMPLANT
PROBE BIPOLAR ATHRO 135MM 90D (MISCELLANEOUS) IMPLANT
SET ARTHROSCOPY TUBING (MISCELLANEOUS) ×1
SET ARTHROSCOPY TUBING LN (MISCELLANEOUS) ×1 IMPLANT
SHAVER 4.2 MM LANZA 9391A (BLADE) ×2 IMPLANT
SUT ETHILON 3 0 PS 1 (SUTURE) ×2 IMPLANT
SYR CONTROL 10ML LL (SYRINGE) ×2 IMPLANT
TOWEL OR 17X24 6PK STRL BLUE (TOWEL DISPOSABLE) ×2 IMPLANT
TUBE CONNECTING 12X1/4 (SUCTIONS) ×2 IMPLANT
WAND 30 DEG SABER W/CORD (SURGICAL WAND) IMPLANT
WATER STERILE IRR 500ML POUR (IV SOLUTION) ×2 IMPLANT

## 2017-08-30 NOTE — Discharge Instructions (Signed)
Orthopedic discharge instructions:  -Okay for full weightbearing as tolerated to the left lower extremity.  Use crutches as needed until your strength returns. -Maintain your postoperative bandages for 3 days.  On the fourth postoperative day you may remove the bandages as begin showering at that point in time.  Pat your incisions dry with a towel and then covered with Band-Aids. -For the prevention of blood clots take a baby aspirin twice daily for 6 weeks. -For mild to moderate pain take Tylenol and/or ibuprofen as directed over-the-counter.  For moderate to severe pain take Norco tablets as directed. -Apply ice to the knee 20-30 minutes out of each hour, at a time throughout the day -Return to see Dr. Stann Mainland in 2 weeks for suture removal.   Post Anesthesia Home Care Instructions  Activity: Get plenty of rest for the remainder of the day. A responsible individual must stay with you for 24 hours following the procedure.  For the next 24 hours, DO NOT: -Drive a car -Paediatric nurse -Drink alcoholic beverages -Take any medication unless instructed by your physician -Make any legal decisions or sign important papers.  Meals: Start with liquid foods such as gelatin or soup. Progress to regular foods as tolerated. Avoid greasy, spicy, heavy foods. If nausea and/or vomiting occur, drink only clear liquids until the nausea and/or vomiting subsides. Call your physician if vomiting continues.  Special Instructions/Symptoms: Your throat may feel dry or sore from the anesthesia or the breathing tube placed in your throat during surgery. If this causes discomfort, gargle with warm salt water. The discomfort should disappear within 24 hours.  No Advil, Aleve, Motrin, or Ibuprofen before 2 PM.

## 2017-08-30 NOTE — Transfer of Care (Signed)
  Last Vitals:  Vitals:   08/30/17 0537 08/30/17 0815  BP: 125/76   Pulse: 70 (P) 64  Resp: 16 (P) 16  Temp: 37.1 C (P) 36.6 C  SpO2: 100% (P) 100%    Last Pain:  Vitals:   08/30/17 0610  TempSrc:   PainSc: 10-Worst pain ever      Patients Stated Pain Goal: 7 (08/30/17 9562)  Immediate Anesthesia Transfer of Care Note  Patient: Yesenia Wheeler  Procedure(s) Performed: Procedure(s) (LRB): KNEE ARTHROSCOPY WITH PARTIAL MEDIAL MENISECTOMY (Left)  Patient Location: PACU  Anesthesia Type: General  Level of Consciousness: awake, alert  and oriented  Airway & Oxygen Therapy: Patient Spontanous Breathing and Patient connected to face mask oxygen  Post-op Assessment: Report given to PACU RN and Post -op Vital signs reviewed and stable  Post vital signs: Reviewed and stable  Complications: No apparent anesthesia complications

## 2017-08-30 NOTE — H&P (Signed)
ORTHOPAEDIC CONSULTATION  REQUESTING PHYSICIAN: Nicholes Stairs, MD  PCP:  System, Pcp Not In  Chief Complaint: Left knee pain  HPI: Yesenia Wheeler is a 47 y.o. female who complains of many months now of increasing left knee pain.  She has medial meniscus pathology as well as chondromalacia of the medial compartment.  We have managed her with steroid injections, oral medications and activity modification.  She is here today for arthroscopic debridement and partial meniscectomy.  We have previously reviewed the risks, benefits, and indications of this procedure.  She has no new complaints today.  Past Medical History:  Diagnosis Date  . Acute meniscal tear of left knee   . Anemia   . 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 Migraines   . Mild asthma   . OA (osteoarthritis)    bilateral knees   Past Surgical History:  Procedure Laterality Date  . CESAREAN SECTION  2011  . GASTRIC BYPASS  2001  . KNEE ARTHROSCOPY W/ MENISCECTOMY Right 2007  . LAPAROSCOPIC CHOLECYSTECTOMY  2001  . SHOULDER ARTHROSCOPY Left 2008   spur   Social History   Socioeconomic History  . Marital status: Married    Spouse name: [name]  . Number of children: 2  . Years of education: Associates  . Highest education level: None  Social Needs  . Financial resource strain: None  . Food insecurity - worry: None  . Food insecurity - inability: None  . Transportation needs - medical: None  . Transportation needs - non-medical: None  Occupational History  . Occupation: Economist: [employer]  Tobacco Use  . Smoking status: Former Smoker    Years: 6.00    Types: Cigarettes    Last attempt to quit: 06/23/2017    Years since quitting: 0.1  . Smokeless tobacco: Never  Used  . Tobacco comment: 2ppwk  Substance and Sexual Activity  . Alcohol use: Yes    Comment: occasional  . Drug use: No  . Sexual activity: None  Other Topics Concern  . None  Social History Narrative   Lives with her husband and their children   Family History  Problem Relation Age of Onset  . Cancer Father        oral   No Known Allergies Prior to Admission medications   Medication Sig Start Date End Date Taking? Authorizing Provider  carisoprodol (SOMA) 350 MG tablet Take 350 mg by mouth 3 (three) times daily.    Yes [provider]  metaxalone (SKELAXIN) 800 MG tablet Take 800 mg by mouth 3 (three) times daily as needed for muscle spasms.   Yes [provider]  mirtazapine (REMERON) 7.5 MG tablet Take 7.5 mg by mouth at bedtime.   Yes [provider]  sertraline (ZOLOFT) 25 MG tablet Take 25 mg by mouth every evening.    Yes [provider]  traZODone (DESYREL) 100 MG tablet Take 100 mg at bedtime as needed by mouth for sleep.   Yes [provider]  albuterol (PROAIR HFA) 108 (90 Base) MCG/ACT inhaler Inhale into the lungs every 6 (six) hours as needed for wheezing or shortness of breath.    [provider]   No results  found.  Positive ROS: All other systems have been reviewed and were otherwise negative with the exception of those mentioned in the HPI and as above.  Physical Exam: General: Alert, no acute distress Cardiovascular: No pedal edema Respiratory: No cyanosis, no use of accessory musculature GI: No organomegaly, abdomen is soft and non-tender Skin: No lesions in the area of chief complaint Neurologic: Sensation intact distally Psychiatric: Patient is competent for consent with normal mood and affect Lymphatic: No axillary or cervical lymphadenopathy    Assessment: Left knee medial meniscus tear Left knee chondromalacia  Plan: -Our plan is for arthroscopic intervention today with partial medial and  possible partial lateral meniscectomies.  We will also perform chondroplasties as indicated.  We have again reviewed the risk, benefits, and indications of this procedure.  All questions were solicited and answered.  She did provide informed consent. -Plan will be for discharge home from PACU with crutches for assisted weightbearing.  She will begin physical therapy next week.  We will place her on aspirin for 6 weeks for postoperative DVT prophylaxis.    Nicholes Stairs, MD Cell [phone]    08/30/2017 7:11 AM

## 2017-08-30 NOTE — Anesthesia Preprocedure Evaluation (Signed)
Anesthesia Evaluation  Patient identified by MRN, date of birth, ID band Patient awake    Reviewed: Allergy & Precautions, NPO status , Patient's Chart, lab work & pertinent test results  Airway Mallampati: II  TM Distance: >3 FB Neck ROM: Full    Dental no notable dental hx. (+) Caps   Pulmonary asthma , former smoker,    Pulmonary exam normal breath sounds clear to auscultation       Cardiovascular + DVT  Normal cardiovascular exam Rhythm:Regular Rate:Normal     Neuro/Psych negative neurological ROS  negative psych ROS   GI/Hepatic negative GI ROS, Neg liver ROS,   Endo/Other  Morbid obesity  Renal/GU negative Renal ROS  negative genitourinary   Musculoskeletal negative musculoskeletal ROS (+)   Abdominal   Peds negative pediatric ROS (+)  Hematology negative hematology ROS (+)   Anesthesia Other Findings   Reproductive/Obstetrics negative OB ROS                             Anesthesia Physical Anesthesia Plan  ASA: III  Anesthesia Plan: General   Post-op Pain Management:    Induction: Intravenous  PONV Risk Score and Plan: 3 and Ondansetron, Dexamethasone, Midazolam and Scopolamine patch - Pre-op  Airway Management Planned: LMA  Additional Equipment:   Intra-op Plan:   Post-operative Plan: Extubation in OR  Informed Consent: I have reviewed the patients History and Physical, chart, labs and discussed the procedure including the risks, benefits and alternatives for the proposed anesthesia with the patient or authorized representative who has indicated his/her understanding and acceptance.   Dental advisory given  Plan Discussed with: CRNA  Anesthesia Plan Comments:         Anesthesia Quick Evaluation

## 2017-08-30 NOTE — Anesthesia Postprocedure Evaluation (Signed)
Anesthesia Post Note  Patient: Yesenia Wheeler  Procedure(s) Performed: KNEE ARTHROSCOPY WITH PARTIAL MEDIAL MENISECTOMY (Left )     Patient location during evaluation: PACU Anesthesia Type: General Level of consciousness: awake and alert Pain management: pain level controlled Vital Signs Assessment: post-procedure vital signs reviewed and stable Respiratory status: spontaneous breathing, nonlabored ventilation, respiratory function stable and patient connected to nasal cannula oxygen Cardiovascular status: blood pressure returned to baseline and stable Postop Assessment: no apparent nausea or vomiting Anesthetic complications: no    Last Vitals:  Vitals:   08/30/17 1030 08/30/17 1141  BP: 114/84 121/69  Pulse: 63 68  Resp: 12 14  Temp:  36.7 C  SpO2: 100% 99%    Last Pain:  Vitals:   08/30/17 1115  TempSrc:   PainSc: 5                  Montez Hageman

## 2017-08-30 NOTE — Brief Op Note (Signed)
08/30/2017  8:35 AM  PATIENT:  Yesenia Wheeler  47 y.o. female  PRE-OPERATIVE DIAGNOSIS:  Left knee medial meniscus tear  POST-OPERATIVE DIAGNOSIS:  Left knee medial meniscus tear  PROCEDURE:  Procedure(s) with comments: KNEE ARTHROSCOPY WITH PARTIAL MEDIAL MENISECTOMY (Left) - 60 mins  SURGEON:  Surgeon(s) and Role:    * Stann Mainland, Elly Modena, MD - Primary  PHYSICIAN ASSISTANT:   ASSISTANTS: none   ANESTHESIA:   general  EBL:  10 mL   BLOOD ADMINISTERED:none  DRAINS: none   LOCAL MEDICATIONS USED:  MARCAINE     SPECIMEN:  No Specimen  DISPOSITION OF SPECIMEN:  N/A  COUNTS:  YES  TOURNIQUET:  * Missing tourniquet times found for documented tourniquets in log: [id number] *  DICTATION: .Note written in EPIC  PLAN OF CARE: Discharge to home after PACU  PATIENT DISPOSITION:  PACU - hemodynamically stable.   Delay start of Pharmacological VTE agent (>24hrs) due to surgical blood loss or risk of bleeding: no

## 2017-08-30 NOTE — Op Note (Signed)
Surgery Date: 08/30/2017  Surgeon(s): Nicholes Stairs, MD  ANESTHESIA:  general  FLUIDS: Per anesthesia record.   ESTIMATED BLOOD LOSS: minimal  PREOPERATIVE DIAGNOSES:  1.  Left knee medial meniscus tear 2.  knee synovitis  POSTOPERATIVE DIAGNOSES:  1.  Left knee medial meniscus tear 2.  knee synovitis 3.  Left knee lateral meniscus tear 4.  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  PROCEDURES PERFORMED:  1.  Left knee arthroscopy with limited synovectomy 2. knee arthroscopy with arthroscopic partial medial and lateral meniscectomy 3. knee arthroscopy with arthroscopic chondroplasty medial femoral condyle and lateral tibial plateau.   DESCRIPTION OF PROCEDURE: Yesenia Wheeler is a 47 y.o.-year-old female with chronic left knee pain with MRI confirmed medial meniscus tear. Plans are to proceed with partial medial and possible lateral meniscectomy and diagnostic arthroscopy with debridement as indicated. Full discussion held regarding risks benefits alternatives and complications related surgical intervention. Conservative care options reviewed. All questions answered.  The patient was identified in the preoperative holding area and the operative extremity was marked. The patient was brought to the operating room and transferred to operating table in a supine position. Satisfactory general anesthesia was induced by anesthesiology.    Standard anterolateral, anteromedial arthroscopy portals were obtained. The anteromedial portal was obtained with a spinal needle for localization under direct visualization with subsequent diagnostic findings.   Anteromedial and anterolateral chambers: mild synovitis. The synovitis was debrided with a 4.5 mm full radius shaver through both the anteromedial and lateral portals.   Suprapatellar pouch and gutters: mild synovitis or debris. Patella chondral surface: Grade 1 Trochlear  chondral surface: Grade 1 Patellofemoral tracking: Midline, no tilt Medial meniscus: Horizontal tear from mid body through posterior horn.  Posterior root and anterior body intact with no tear..  Medial femoral condyle flexion bearing surface: Grade 3 8 mm x 4 mm  medial femoral condyle extension bearing surface: Grade 0 Medial tibial plateau: Grade 1 Anterior cruciate ligament:stable Posterior cruciate ligament:stable Lateral meniscus: Free edge tearing from the posterior root around to the mid body.   Lateral femoral condyle flexion bearing surface: Grade 0 Lateral femoral condyle extension bearing surface: Grade 0 Lateral tibial plateau: Grade 2  Partial medial meniscectomy was carried out with meniscal biters is as well as rotary shaver and RF ablation wand.  The undersurface flap tear was debrided completely.  The free edge tearing was contoured to a smooth stable border.  There were no unstable fragments at the completion.  Partial lateral meniscectomy was carried out by debriding the free edge tear with the rotary shaver as well as combination with the radio frequency wand.  At the completion of this the lateral meniscus had a smooth contoured edge.  No loose bodies.  Chondroplasty was carried out on the medial femoral condyle as well as lateral tibial plateau using a debridement technique with the motorized shaver.  All free flap edges were smoothed with the shaver.  There were no free flaps or loose edges at the completion.  After completion of synovectomy, diagnostic exam, and debridements as described, all compartments were checked and no residual debris remained. Hemostasis was achieved with the cautery wand. The portals were approximated with buried monocryl. All excess fluid was expressed from the joint.  Xeroform sterile gauze dressings were applied followed by Ace bandage and ice pack.   DISPOSITION: The patient was awakened from general anesthetic, extubated, taken to the  recovery room in medically stable  condition, no apparent complications. The patient may be weightbearing as tolerated to the operative lower extremity.  Range of motion of right knee as tolerated.

## 2017-08-30 NOTE — Anesthesia Procedure Notes (Signed)
Procedure Name: LMA Insertion Date/Time: 08/30/2017 7:36 AM Performed by: Mechele Claude, CRNA Pre-anesthesia Checklist: Patient identified, Emergency Drugs available, Suction available and Patient being monitored Patient Re-evaluated:Patient Re-evaluated prior to induction Oxygen Delivery Method: Circle system utilized Preoxygenation: Pre-oxygenation with 100% oxygen Induction Type: IV induction Ventilation: Mask ventilation without difficulty LMA: LMA inserted LMA Size: 4.0 Number of attempts: 1 Airway Equipment and Method: Bite block Placement Confirmation: positive ETCO2 Tube secured with: Tape Dental Injury: Teeth and Oropharynx as per pre-operative assessment

## 2017-09-03 ENCOUNTER — Encounter (HOSPITAL_BASED_OUTPATIENT_CLINIC_OR_DEPARTMENT_OTHER): Payer: Self-pay | Admitting: Orthopedic Surgery

## 2017-09-25 ENCOUNTER — Other Ambulatory Visit: Payer: Self-pay | Admitting: Surgery

## 2017-09-25 DIAGNOSIS — N6320 Unspecified lump in the left breast, unspecified quadrant: Secondary | ICD-10-CM

## 2017-10-04 ENCOUNTER — Ambulatory Visit: Payer: Self-pay | Admitting: Surgery

## 2017-10-04 DIAGNOSIS — N6320 Unspecified lump in the left breast, unspecified quadrant: Secondary | ICD-10-CM

## 2017-10-17 ENCOUNTER — Other Ambulatory Visit: Payer: Self-pay

## 2017-10-17 ENCOUNTER — Encounter (HOSPITAL_BASED_OUTPATIENT_CLINIC_OR_DEPARTMENT_OTHER): Payer: Self-pay | Admitting: *Deleted

## 2017-10-22 ENCOUNTER — Ambulatory Visit
Admission: RE | Admit: 2017-10-22 | Discharge: 2017-10-22 | Disposition: A | Discharge status: Home / Self Care | Payer: BLUE CROSS/BLUE SHIELD | Source: Ambulatory Visit | Attending: Surgery | Admitting: Surgery

## 2017-10-22 DIAGNOSIS — N6320 Unspecified lump in the left breast, unspecified quadrant: Secondary | ICD-10-CM

## 2017-10-22 NOTE — H&P (Signed)
Subjective:     Patient ID: Yesenia Wheeler is a 47 y.o. female.  HPI  Here for follow up discussion prior to oncoplastic reduction. Presented following screening MMG with left breast mass measuring 1.6 cm. Biopsy showed a complex sclerosing lesion and excision recommended. Biopsy of an abnormal-appearing left axillary LN was benign.  Patient complains of several year history neck and back pain. This has not been controlled by PT, specialty fitted bras, narcotic and NSAID pain medication, Tramadol. Reports numbness bilateral hands. Denies rashes. She has had prior consultation with Dr. Harlow Mares 2017 for reduction. Current 40H cup.   Wt highest 315 lb.  She underwent lap gastric bypass 2001 with lowest weight 150 lb. Wt increased.  PMH significant for DVT x 4, two during pregnancy. Last 01/2015 right soleal, states did not tolerate Coumadin and completed 30 d Xarelto. Denies any hypocoagulability work up. After completion of visit- review of [facility] chart indicates she was givne starter kit for Xarelto but did not continue as she was not insured at time. Per chart she was also not on Coumadin and does not appear she completed treatment of this. In 2008, there are labs with below normal Protein C and S activity. States she has used lovenox while pregnant.  She returns today post knee surgery. Reports she is on ASA for DVT prophylaxsis per Ortho post op. She was still smoking and surgery delayed to allow her to stop all nicotine products.   Works as Pension scheme manager. Lives with husband and children who span ages 39-21.  Review of Systems     Objective:   Physical Exam  Cardiovascular: Normal rate, regular rhythm and normal heart sounds.   Pulmonary/Chest: Effort normal and breath sounds normal.  Skin:  Fitzpatrick 6   +shoulder grooving Grade 3 ptosis bilateral SN to nipple R 45 L 45 cm BW R 14 L 14 cm Nipple to IMF R 19 L 21 cm    Assessment:     Complex sclerosing lesion  left  Breast Macromastia Chronic neck and back pain Obesity Hx DVT multiple, possible hypercoagulability from prior labs    Plan:      Counseled her she is high risk surgically including NAC loss, wound healing problems, DVT and I will not do reduction while smoking.  Reviewed reduction with anchor type scars, possible overnight hospital stay or drains, post operative visits and limitations, recovery. Diminished sensation nipple and breast skin, risk of nipple loss, wound healing problems, asymmetry, incidental carcinoma, changes with wt gain/loss, aging, unacceptable cosmetic appearance reviewed. Additional risks including but not limited to bleeding, infection, anesthesia, scar, cardiopulmonary complications, fat necrosis, seroma, hematoma reviewed.  She is very high risk DVT- she may have a hypercoagulability based on previous labs. Plan to cover patient post operatively with Lovenox x 2 weeks, hold ASA.  She is high risk NAC loss- she must understand this and agree to possible free nipple grafts or delayed reconstruction NAC. She states this is fine.  Reviewed with her job anticipate at least 6 weeks to be unrestricted with regards to work. Patient reports she has arranged to 2 week recovery time with return to work as light duty.  Anticipate 850 g reduction from each breast.    Irene Limbo, MD [facility] Plastic & Reconstructive Surgery [phone], pin [phone]

## 2017-10-22 NOTE — Progress Notes (Signed)
Ensure pre surgery drink given with instructions to complete by 0515 dos, surgical soap given with instructions, pt verbalized understanding. 

## 2017-10-23 ENCOUNTER — Encounter (HOSPITAL_BASED_OUTPATIENT_CLINIC_OR_DEPARTMENT_OTHER)
Admission: RE | Disposition: A | Discharge status: Home / Self Care | Payer: Self-pay | Source: Ambulatory Visit | Attending: Surgery

## 2017-10-23 ENCOUNTER — Ambulatory Visit (HOSPITAL_BASED_OUTPATIENT_CLINIC_OR_DEPARTMENT_OTHER): Payer: BLUE CROSS/BLUE SHIELD | Admitting: Anesthesiology

## 2017-10-23 ENCOUNTER — Other Ambulatory Visit: Payer: Self-pay

## 2017-10-23 ENCOUNTER — Ambulatory Visit (HOSPITAL_BASED_OUTPATIENT_CLINIC_OR_DEPARTMENT_OTHER)
Admission: RE | Admit: 2017-10-23 | Discharge: 2017-10-24 | Disposition: A | Discharge status: Home / Self Care | Payer: BLUE CROSS/BLUE SHIELD | Source: Ambulatory Visit | Attending: Surgery | Admitting: Surgery

## 2017-10-23 ENCOUNTER — Ambulatory Visit
Admission: RE | Admit: 2017-10-23 | Discharge: 2017-10-23 | Disposition: A | Discharge status: Home / Self Care | Payer: BLUE CROSS/BLUE SHIELD | Source: Ambulatory Visit | Attending: Surgery | Admitting: Surgery

## 2017-10-23 ENCOUNTER — Encounter (HOSPITAL_BASED_OUTPATIENT_CLINIC_OR_DEPARTMENT_OTHER): Payer: Self-pay | Admitting: *Deleted

## 2017-10-23 DIAGNOSIS — Z86718 Personal history of other venous thrombosis and embolism: Secondary | ICD-10-CM | POA: Diagnosis not present

## 2017-10-23 DIAGNOSIS — N62 Hypertrophy of breast: Secondary | ICD-10-CM | POA: Diagnosis not present

## 2017-10-23 DIAGNOSIS — M542 Cervicalgia: Secondary | ICD-10-CM | POA: Diagnosis not present

## 2017-10-23 DIAGNOSIS — Z6841 Body Mass Index (BMI) 40.0 and over, adult: Secondary | ICD-10-CM | POA: Diagnosis not present

## 2017-10-23 DIAGNOSIS — Z87891 Personal history of nicotine dependence: Secondary | ICD-10-CM | POA: Insufficient documentation

## 2017-10-23 DIAGNOSIS — J45909 Unspecified asthma, uncomplicated: Secondary | ICD-10-CM | POA: Insufficient documentation

## 2017-10-23 DIAGNOSIS — Z791 Long term (current) use of non-steroidal anti-inflammatories (NSAID): Secondary | ICD-10-CM | POA: Diagnosis not present

## 2017-10-23 DIAGNOSIS — N6320 Unspecified lump in the left breast, unspecified quadrant: Secondary | ICD-10-CM | POA: Diagnosis present

## 2017-10-23 DIAGNOSIS — E669 Obesity, unspecified: Secondary | ICD-10-CM | POA: Diagnosis not present

## 2017-10-23 DIAGNOSIS — M549 Dorsalgia, unspecified: Secondary | ICD-10-CM | POA: Insufficient documentation

## 2017-10-23 DIAGNOSIS — F329 Major depressive disorder, single episode, unspecified: Secondary | ICD-10-CM | POA: Insufficient documentation

## 2017-10-23 DIAGNOSIS — G8929 Other chronic pain: Secondary | ICD-10-CM | POA: Diagnosis not present

## 2017-10-23 DIAGNOSIS — F419 Anxiety disorder, unspecified: Secondary | ICD-10-CM | POA: Diagnosis not present

## 2017-10-23 DIAGNOSIS — Z79899 Other long term (current) drug therapy: Secondary | ICD-10-CM | POA: Insufficient documentation

## 2017-10-23 DIAGNOSIS — Z9884 Bariatric surgery status: Secondary | ICD-10-CM | POA: Diagnosis not present

## 2017-10-23 DIAGNOSIS — N6489 Other specified disorders of breast: Secondary | ICD-10-CM | POA: Insufficient documentation

## 2017-10-23 DIAGNOSIS — N644 Mastodynia: Secondary | ICD-10-CM | POA: Diagnosis not present

## 2017-10-23 DIAGNOSIS — N63 Unspecified lump in unspecified breast: Secondary | ICD-10-CM | POA: Diagnosis present

## 2017-10-23 HISTORY — PX: BREAST LUMPECTOMY WITH RADIOACTIVE SEED LOCALIZATION: SHX6424

## 2017-10-23 HISTORY — PX: BREAST REDUCTION SURGERY: SHX8

## 2017-10-23 SURGERY — BREAST LUMPECTOMY WITH RADIOACTIVE SEED LOCALIZATION
Anesthesia: General | Site: Breast | Laterality: Left

## 2017-10-23 MED ORDER — ENOXAPARIN SODIUM 40 MG/0.4ML ~~LOC~~ SOLN
40.0000 mg | SUBCUTANEOUS | Status: DC
  Administered 2017-10-24: 40 mg via SUBCUTANEOUS
  Filled 2017-10-23: qty 0.4

## 2017-10-23 MED ORDER — PHENYLEPHRINE HCL 10 MG/ML IJ SOLN
INTRAMUSCULAR | Status: DC | PRN
  Administered 2017-10-23: 120 ug via INTRAVENOUS

## 2017-10-23 MED ORDER — LIDOCAINE HCL (CARDIAC) 20 MG/ML IV SOLN
INTRAVENOUS | Status: AC
  Filled 2017-10-23: qty 5

## 2017-10-23 MED ORDER — FENTANYL CITRATE (PF) 100 MCG/2ML IJ SOLN
INTRAMUSCULAR | Status: AC
  Filled 2017-10-23: qty 2

## 2017-10-23 MED ORDER — DEXAMETHASONE SODIUM PHOSPHATE 10 MG/ML IJ SOLN
INTRAMUSCULAR | Status: AC
  Filled 2017-10-23: qty 1

## 2017-10-23 MED ORDER — CEFAZOLIN SODIUM 1 G IJ SOLR
INTRAMUSCULAR | Status: AC
  Filled 2017-10-23: qty 20

## 2017-10-23 MED ORDER — MIDAZOLAM HCL 2 MG/2ML IJ SOLN: 1.0000 mg | INTRAMUSCULAR | Status: DC | PRN

## 2017-10-23 MED ORDER — OXYCODONE HCL 5 MG PO TABS
5.0000 mg | ORAL_TABLET | ORAL | Status: DC | PRN
Start: 2017-10-23 — End: 2017-10-24
  Administered 2017-10-23 – 2017-10-24 (×2): 10 mg via ORAL
  Filled 2017-10-23 (×2): qty 2

## 2017-10-23 MED ORDER — FENTANYL CITRATE (PF) 100 MCG/2ML IJ SOLN: 50.0000 ug | INTRAMUSCULAR | Status: DC | PRN

## 2017-10-23 MED ORDER — CEFAZOLIN SODIUM-DEXTROSE 2-4 GM/100ML-% IV SOLN
INTRAVENOUS | Status: AC
  Filled 2017-10-23: qty 100

## 2017-10-23 MED ORDER — DEXTROSE 5 % IV SOLN: 3.0000 g | INTRAVENOUS | Status: DC

## 2017-10-23 MED ORDER — GABAPENTIN 300 MG PO CAPS
ORAL_CAPSULE | ORAL | Status: AC
  Filled 2017-10-23: qty 1

## 2017-10-23 MED ORDER — ONDANSETRON HCL 4 MG/2ML IJ SOLN
INTRAMUSCULAR | Status: DC | PRN
  Administered 2017-10-23 (×2): 4 mg via INTRAVENOUS

## 2017-10-23 MED ORDER — GABAPENTIN 300 MG PO CAPS
300.0000 mg | ORAL_CAPSULE | Freq: Two times a day (BID) | ORAL | Status: DC
  Administered 2017-10-23: 300 mg via ORAL

## 2017-10-23 MED ORDER — FENTANYL CITRATE (PF) 100 MCG/2ML IJ SOLN
INTRAMUSCULAR | Status: DC | PRN
  Administered 2017-10-23: 100 ug via INTRAVENOUS
  Administered 2017-10-23: 50 ug via INTRAVENOUS
  Administered 2017-10-23 (×5): 25 ug via INTRAVENOUS
  Administered 2017-10-23: 50 ug via INTRAVENOUS

## 2017-10-23 MED ORDER — ONDANSETRON HCL 4 MG/2ML IJ SOLN: 4.0000 mg | Freq: Four times a day (QID) | INTRAMUSCULAR | Status: DC | PRN

## 2017-10-23 MED ORDER — PROMETHAZINE HCL 25 MG/ML IJ SOLN
6.2500 mg | Freq: Once | INTRAMUSCULAR | Status: AC | PRN
  Administered 2017-10-23: 6.25 mg via INTRAVENOUS

## 2017-10-23 MED ORDER — LACTATED RINGERS IV SOLN
INTRAVENOUS | Status: DC | PRN
  Administered 2017-10-23 (×2): via INTRAVENOUS

## 2017-10-23 MED ORDER — SODIUM CHLORIDE 0.9 % IV SOLN
INTRAVENOUS | Status: DC | PRN
  Administered 2017-10-23: 40 mL

## 2017-10-23 MED ORDER — MIRTAZAPINE 7.5 MG PO TABS: 7.5000 mg | ORAL_TABLET | Freq: Every day | ORAL | Status: DC

## 2017-10-23 MED ORDER — DIPHENHYDRAMINE HCL 50 MG/ML IJ SOLN: 25.0000 mg | Freq: Four times a day (QID) | INTRAMUSCULAR | Status: DC | PRN

## 2017-10-23 MED ORDER — SODIUM CHLORIDE 0.9 % IJ SOLN
INTRAMUSCULAR | Status: AC
  Filled 2017-10-23: qty 10

## 2017-10-23 MED ORDER — MIDAZOLAM HCL 2 MG/2ML IJ SOLN
INTRAMUSCULAR | Status: DC | PRN
  Administered 2017-10-23: 2 mg via INTRAVENOUS

## 2017-10-23 MED ORDER — GABAPENTIN 300 MG PO CAPS: 300.0000 mg | ORAL_CAPSULE | ORAL | Status: DC

## 2017-10-23 MED ORDER — DEXAMETHASONE SODIUM PHOSPHATE 4 MG/ML IJ SOLN
INTRAMUSCULAR | Status: DC | PRN
  Administered 2017-10-23: 10 mg via INTRAVENOUS

## 2017-10-23 MED ORDER — ROCURONIUM BROMIDE 100 MG/10ML IV SOLN
INTRAVENOUS | Status: DC | PRN
  Administered 2017-10-23: 70 mg via INTRAVENOUS

## 2017-10-23 MED ORDER — ACETAMINOPHEN 500 MG PO TABS
ORAL_TABLET | ORAL | Status: AC
  Filled 2017-10-23: qty 2

## 2017-10-23 MED ORDER — GABAPENTIN 300 MG PO CAPS
300.0000 mg | ORAL_CAPSULE | ORAL | Status: AC
  Administered 2017-10-23: 300 mg via ORAL

## 2017-10-23 MED ORDER — CEFAZOLIN SODIUM-DEXTROSE 2-4 GM/100ML-% IV SOLN
2.0000 g | INTRAVENOUS | Status: AC
  Administered 2017-10-23 (×2): 2 g via INTRAVENOUS

## 2017-10-23 MED ORDER — LACTATED RINGERS IV SOLN
INTRAVENOUS | Status: DC
  Administered 2017-10-23: 09:00:00 via INTRAVENOUS

## 2017-10-23 MED ORDER — KETOROLAC TROMETHAMINE 30 MG/ML IJ SOLN
30.0000 mg | Freq: Three times a day (TID) | INTRAMUSCULAR | Status: AC
  Administered 2017-10-23 – 2017-10-24 (×3): 30 mg via INTRAVENOUS
  Filled 2017-10-23 (×3): qty 1

## 2017-10-23 MED ORDER — PROPOFOL 10 MG/ML IV BOLUS
INTRAVENOUS | Status: AC
  Filled 2017-10-23: qty 20

## 2017-10-23 MED ORDER — KCL IN DEXTROSE-NACL 20-5-0.45 MEQ/L-%-% IV SOLN
INTRAVENOUS | Status: DC
  Administered 2017-10-23: 16:00:00 via INTRAVENOUS
  Filled 2017-10-23: qty 1000

## 2017-10-23 MED ORDER — CHLORHEXIDINE GLUCONATE CLOTH 2 % EX PADS: 6.0000 | MEDICATED_PAD | Freq: Once | CUTANEOUS | Status: DC

## 2017-10-23 MED ORDER — BUPIVACAINE LIPOSOME 1.3 % IJ SUSP
INTRAMUSCULAR | Status: AC
  Filled 2017-10-23: qty 20

## 2017-10-23 MED ORDER — HEPARIN SODIUM (PORCINE) 5000 UNIT/ML IJ SOLN
5000.0000 [IU] | Freq: Once | INTRAMUSCULAR | Status: AC
  Administered 2017-10-23: 5000 [IU] via SUBCUTANEOUS

## 2017-10-23 MED ORDER — ONDANSETRON HCL 4 MG/2ML IJ SOLN
INTRAMUSCULAR | Status: AC
  Filled 2017-10-23: qty 2

## 2017-10-23 MED ORDER — HYDROMORPHONE HCL 1 MG/ML IJ SOLN: 0.5000 mg | INTRAMUSCULAR | Status: DC | PRN

## 2017-10-23 MED ORDER — LIDOCAINE HCL (CARDIAC) 20 MG/ML IV SOLN
INTRAVENOUS | Status: DC | PRN
  Administered 2017-10-23: 80 mg via INTRAVENOUS

## 2017-10-23 MED ORDER — DIPHENHYDRAMINE HCL 25 MG PO CAPS: 25.0000 mg | ORAL_CAPSULE | Freq: Four times a day (QID) | ORAL | Status: DC | PRN

## 2017-10-23 MED ORDER — HEPARIN SODIUM (PORCINE) 5000 UNIT/ML IJ SOLN
INTRAMUSCULAR | Status: AC
  Filled 2017-10-23: qty 1

## 2017-10-23 MED ORDER — ROCURONIUM BROMIDE 10 MG/ML (PF) SYRINGE
PREFILLED_SYRINGE | INTRAVENOUS | Status: AC
  Filled 2017-10-23: qty 5

## 2017-10-23 MED ORDER — METAXALONE 800 MG PO TABS: 800.0000 mg | ORAL_TABLET | Freq: Three times a day (TID) | ORAL | Status: DC | PRN

## 2017-10-23 MED ORDER — CELECOXIB 200 MG PO CAPS
ORAL_CAPSULE | ORAL | Status: AC
  Filled 2017-10-23: qty 1

## 2017-10-23 MED ORDER — SODIUM CHLORIDE 0.9 % IJ SOLN
INTRAMUSCULAR | Status: AC
  Filled 2017-10-23: qty 20

## 2017-10-23 MED ORDER — SCOPOLAMINE 1 MG/3DAYS TD PT72: 1.0000 | MEDICATED_PATCH | Freq: Once | TRANSDERMAL | Status: DC | PRN

## 2017-10-23 MED ORDER — ALBUTEROL SULFATE HFA 108 (90 BASE) MCG/ACT IN AERS: 1.0000 | INHALATION_SPRAY | RESPIRATORY_TRACT | Status: DC | PRN

## 2017-10-23 MED ORDER — ACETAMINOPHEN 500 MG PO TABS
1000.0000 mg | ORAL_TABLET | ORAL | Status: AC
  Administered 2017-10-23: 1000 mg via ORAL

## 2017-10-23 MED ORDER — CELECOXIB 200 MG PO CAPS: 200.0000 mg | ORAL_CAPSULE | ORAL | Status: DC

## 2017-10-23 MED ORDER — ACETAMINOPHEN 500 MG PO TABS: 1000.0000 mg | ORAL_TABLET | ORAL | Status: DC

## 2017-10-23 MED ORDER — CELECOXIB 200 MG PO CAPS
200.0000 mg | ORAL_CAPSULE | ORAL | Status: AC
  Administered 2017-10-23: 200 mg via ORAL

## 2017-10-23 MED ORDER — ONDANSETRON 4 MG PO TBDP: 4.0000 mg | ORAL_TABLET | Freq: Four times a day (QID) | ORAL | Status: DC | PRN

## 2017-10-23 MED ORDER — FENTANYL CITRATE (PF) 100 MCG/2ML IJ SOLN
25.0000 ug | INTRAMUSCULAR | Status: DC | PRN
  Administered 2017-10-23: 50 ug via INTRAVENOUS

## 2017-10-23 MED ORDER — PROMETHAZINE HCL 25 MG/ML IJ SOLN
INTRAMUSCULAR | Status: AC
  Filled 2017-10-23: qty 1

## 2017-10-23 MED ORDER — MIDAZOLAM HCL 2 MG/2ML IJ SOLN
INTRAMUSCULAR | Status: AC
  Filled 2017-10-23: qty 2

## 2017-10-23 MED ORDER — SUGAMMADEX SODIUM 500 MG/5ML IV SOLN
INTRAVENOUS | Status: AC
  Filled 2017-10-23: qty 5

## 2017-10-23 SURGICAL SUPPLY — 60 items
APPLIER CLIP 9.375 MED OPEN (MISCELLANEOUS) ×3
BINDER BREAST 3XL (GAUZE/BANDAGES/DRESSINGS) IMPLANT
BINDER BREAST LRG (GAUZE/BANDAGES/DRESSINGS) IMPLANT
BINDER BREAST MEDIUM (GAUZE/BANDAGES/DRESSINGS) IMPLANT
BINDER BREAST XLRG (GAUZE/BANDAGES/DRESSINGS) IMPLANT
BINDER BREAST XXLRG (GAUZE/BANDAGES/DRESSINGS) IMPLANT
BLADE SURG 10 STRL SS (BLADE) ×12 IMPLANT
BLADE SURG 15 STRL LF DISP TIS (BLADE) ×2 IMPLANT
BLADE SURG 15 STRL SS (BLADE) ×1
BNDG GAUZE ELAST 4 BULKY (GAUZE/BANDAGES/DRESSINGS) ×6 IMPLANT
CANISTER SUCT 1200ML W/VALVE (MISCELLANEOUS) ×3 IMPLANT
CHLORAPREP W/TINT 26ML (MISCELLANEOUS) ×6 IMPLANT
CLIP APPLIE 9.375 MED OPEN (MISCELLANEOUS) ×2 IMPLANT
COVER BACK TABLE 60X90IN (DRAPES) ×3 IMPLANT
COVER MAYO STAND STRL (DRAPES) ×3 IMPLANT
COVER PROBE W GEL 5X96 (DRAPES) ×3 IMPLANT
DERMABOND ADVANCED (GAUZE/BANDAGES/DRESSINGS) ×1
DERMABOND ADVANCED .7 DNX12 (GAUZE/BANDAGES/DRESSINGS) ×2 IMPLANT
DEVICE DUBIN W/COMP PLATE 8390 (MISCELLANEOUS) ×3 IMPLANT
DRAIN CHANNEL 15F RND FF W/TCR (WOUND CARE) ×6 IMPLANT
DRAPE TOP ARMCOVERS (MISCELLANEOUS) ×3 IMPLANT
DRAPE U-SHAPE 76X120 STRL (DRAPES) ×3 IMPLANT
DRSG PAD ABDOMINAL 8X10 ST (GAUZE/BANDAGES/DRESSINGS) ×6 IMPLANT
ELECT COATED BLADE 2.86 ST (ELECTRODE) ×3 IMPLANT
ELECT REM PT RETURN 9FT ADLT (ELECTROSURGICAL) ×3
ELECTRODE REM PT RTRN 9FT ADLT (ELECTROSURGICAL) ×2 IMPLANT
EVACUATOR SILICONE 100CC (DRAIN) ×6 IMPLANT
GLOVE BIO SURGEON STRL SZ 6 (GLOVE) ×12 IMPLANT
GLOVE BIO SURGEON STRL SZ7 (GLOVE) ×3 IMPLANT
GLOVE BIOGEL PI IND STRL 7.0 (GLOVE) ×4 IMPLANT
GLOVE BIOGEL PI IND STRL 8 (GLOVE) ×2 IMPLANT
GLOVE BIOGEL PI INDICATOR 7.0 (GLOVE) ×2
GLOVE BIOGEL PI INDICATOR 8 (GLOVE) ×1
GLOVE ECLIPSE 8.0 STRL XLNG CF (GLOVE) ×3 IMPLANT
GLOVE SURG SS PI 6.5 STRL IVOR (GLOVE) ×6 IMPLANT
GOWN STRL REUS W/ TWL LRG LVL3 (GOWN DISPOSABLE) ×10 IMPLANT
GOWN STRL REUS W/TWL LRG LVL3 (GOWN DISPOSABLE) ×5
KIT MARKER MARGIN INK (KITS) ×3 IMPLANT
NEEDLE HYPO 25X1 1.5 SAFETY (NEEDLE) ×3 IMPLANT
NS IRRIG 1000ML POUR BTL (IV SOLUTION) ×3 IMPLANT
PACK BASIN DAY SURGERY FS (CUSTOM PROCEDURE TRAY) ×3 IMPLANT
PENCIL BUTTON HOLSTER BLD 10FT (ELECTRODE) ×3 IMPLANT
PIN SAFETY STERILE (MISCELLANEOUS) ×3 IMPLANT
SHEET MEDIUM DRAPE 40X70 STRL (DRAPES) ×6 IMPLANT
SLEEVE SCD COMPRESS KNEE MED (MISCELLANEOUS) ×3 IMPLANT
SPONGE LAP 18X18 RF (DISPOSABLE) ×12 IMPLANT
STAPLER VISISTAT 35W (STAPLE) ×9 IMPLANT
SUT ETHILON 2 0 FS 18 (SUTURE) ×6 IMPLANT
SUT MNCRL AB 4-0 PS2 18 (SUTURE) ×15 IMPLANT
SUT VIC AB 3-0 PS1 18 (SUTURE) ×9
SUT VIC AB 3-0 PS1 18XBRD (SUTURE) ×18 IMPLANT
SUT VICRYL 4-0 PS2 18IN ABS (SUTURE) ×6 IMPLANT
SYR BULB IRRIGATION 50ML (SYRINGE) ×3 IMPLANT
SYR CONTROL 10ML LL (SYRINGE) ×3 IMPLANT
TOWEL OR 17X24 6PK STRL BLUE (TOWEL DISPOSABLE) ×6 IMPLANT
TOWEL OR NON WOVEN STRL DISP B (DISPOSABLE) ×3 IMPLANT
TRAY FOLEY BAG SILVER LF 14FR (SET/KITS/TRAYS/PACK) ×3 IMPLANT
TUBE CONNECTING 20X1/4 (TUBING) ×3 IMPLANT
UNDERPAD 30X30 (UNDERPADS AND DIAPERS) ×6 IMPLANT
YANKAUER SUCT BULB TIP NO VENT (SUCTIONS) ×3 IMPLANT

## 2017-10-23 NOTE — Anesthesia Preprocedure Evaluation (Addendum)
Anesthesia Evaluation  Patient identified by MRN, date of birth, ID band Patient awake    Reviewed: Allergy & Precautions, H&P , Patient's Chart, lab work & pertinent test results, reviewed documented beta blocker date and time   Airway Mallampati: II  TM Distance: >3 FB Neck ROM: full    Dental no notable dental hx.    Pulmonary asthma , former smoker,    Pulmonary exam normal breath sounds clear to auscultation       Cardiovascular  Rhythm:regular Rate:Normal     Neuro/Psych    GI/Hepatic   Endo/Other    Renal/GU      Musculoskeletal   Abdominal   Peds  Hematology   Anesthesia Other Findings   Reproductive/Obstetrics                             Anesthesia Physical Anesthesia Plan  ASA: III  Anesthesia Plan: General   Post-op Pain Management:    Induction: Intravenous  PONV Risk Score and Plan: 2 and Treatment may vary due to age or medical condition, Dexamethasone and Ondansetron  Airway Management Planned: Oral ETT  Additional Equipment:   Intra-op Plan:   Post-operative Plan:   Informed Consent: I have reviewed the patients History and Physical, chart, labs and discussed the procedure including the risks, benefits and alternatives for the proposed anesthesia with the patient or authorized representative who has indicated his/her understanding and acceptance.   Dental Advisory Given  Plan Discussed with: CRNA and Surgeon  Anesthesia Plan Comments: ( )       Anesthesia Quick Evaluation

## 2017-10-23 NOTE — Progress Notes (Signed)
83mL=50mcg fentanyl given out of 87ml=100mcg vial  Unable to waste in pixis due to system error stating only 0.63ml was removed.  80mL=50mcg wasted in trash Verified by Argentina Ponder, RN and Marta Antu, RN

## 2017-10-23 NOTE — Op Note (Signed)
Operative Note   DATE OF OPERATION: 3.12.19  LOCATION: [facility]-observation  SURGICAL DIVISION: Plastic Surgery  PREOPERATIVE DIAGNOSES:  1. Left breast complex sclerosing lesion 2. Macromastia 3. Chronic neck and back pain  POSTOPERATIVE DIAGNOSES:  same  PROCEDURE:  Bilateral breast reduction  SURGEON: Irene Limbo MD MBA  ASSISTANT: none  ANESTHESIA:  General.   EBL: 110 ml for entire procedure  COMPLICATIONS: None immediate.   INDICATIONS FOR PROCEDURE:  The patient, Yesenia Wheeler, is a 47 y.o. female born on 1970-09-30, is here for bilateral breast reduction in setting macromastia and chronic neck and back pain.This will be coordinated with excision left breast mass.   FINDINGS: Right reduction 1514 g Left reduction 1577 g Left lumpectomy 31 g  DESCRIPTION OF PROCEDURE:  The patient was marked standing in the preoperative area to mark sternal notch, chest midline, anterior axillary lines, inframammary folds. The location of new nipple areolar complex was marked at level of on inframammary fold on anterior surface breast by palpation. This was marked symmetric over bilateral breasts. With aid of Wise pattern marker, location of new nipple areolar complex and vertical limbs (8cm) were marked. The patient was taken to the operating room. SCDs were placed and IV antibiotics were given. The patient's operative site was prepped and draped in a sterile fashion. A time out was performed and all information was confirmed to be correct.  Following completion of lumpectomy, over left breast, superomedial pedicle marked and nipple areolar complex marked with74mm diameter marker. Pedicle deepithlialized and developedto chest wall. Breast tissue resected over lower pole including entirety of lumpectomy cavity. Additional lateral breast and superior pole breast tissue excised. Medial and lateral flaps developed.Additional superior pole and lateral chest wall tissue  excised.Breast tailor tacked closed.  I then directed attention to right breast where superomedial pedicle designed. The pedicle was deepithelialized and developed to 5-6 cm thickness and toward chest wall until tension free closure obtained. Lower pole, lateral chest wall, and superior pole breast tissue excised. Breast tailor tacked closed, and patient brought to upright sitting position and assessed for symmetry. Patent returned to supine position and breast cavities irrigated and hemostasis obtained. Exparel infiltrated throughout each breast. 15 Fr JP placed in each breast and secured with 2-0 nylon. Closure completed with 3-0 vicryl to approximate dermis along inframammary fold and vertical limb. NAC inset with 4-0 vicryl in dermis. Skin closure completed with 4-0 monocryl subcuticular throughout.Tissue adhesive applied. Dry dressing and breast binder applied.  The patient was allowed to wake from anesthesia, extubated and taken to the recovery room in satisfactory condition.   SPECIMENS: right and left breast reduction  DRAINS: 15 Fr JP in right and left breast  Irene Limbo, MD [facility] Plastic & Reconstructive Surgery [phone], pin [phone]

## 2017-10-23 NOTE — Interval H&P Note (Signed)
History and Physical Interval Note:  10/23/2017 7:35 AM  Yesenia Wheeler  has presented today for surgery, with the diagnosis of LEFT BREAST MASS  The various methods of treatment have been discussed with the patient and family. After consideration of risks, benefits and other options for treatment, the patient has consented to  Procedure(s): BREAST LUMPECTOMY WITH RADIOACTIVE SEED LOCALIZATION (Left) LEFT BREAST ONCOPLASTIC REDUCTION, RIGHT BREAST REDUCTION (N/A) as a surgical intervention .  The patient's history has been reviewed, patient examined, no change in status, stable for surgery.  I have reviewed the patient's chart and labs.  Questions were answered to the patient's satisfaction.     Griselle Rufer

## 2017-10-23 NOTE — Anesthesia Procedure Notes (Signed)
Procedure Name: Intubation Date/Time: 10/23/2017 9:20 AM Performed by: Rayvon Char, CRNA Pre-anesthesia Checklist: Patient identified, Emergency Drugs available, Suction available and Patient being monitored Patient Re-evaluated:Patient Re-evaluated prior to induction Oxygen Delivery Method: Circle system utilized Preoxygenation: Pre-oxygenation with 100% oxygen Induction Type: IV induction Ventilation: Mask ventilation without difficulty Laryngoscope Size: Miller and 2 Grade View: Grade II Tube type: Oral Number of attempts: 1 Airway Equipment and Method: Bite block Placement Confirmation: ETT inserted through vocal cords under direct vision and breath sounds checked- equal and bilateral Secured at: 21 cm Tube secured with: Tape Dental Injury: Teeth and Oropharynx as per pre-operative assessment

## 2017-10-23 NOTE — Interval H&P Note (Signed)
History and Physical Interval Note:  10/23/2017 8:38 AM  Yesenia Wheeler  has presented today for surgery, with the diagnosis of LEFT BREAST MASS  The various methods of treatment have been discussed with the patient and family. After consideration of risks, benefits and other options for treatment, the patient has consented to  Procedure(s): BREAST LUMPECTOMY WITH RADIOACTIVE SEED LOCALIZATION (Left) LEFT BREAST ONCOPLASTIC REDUCTION, RIGHT BREAST REDUCTION (N/A) as a surgical intervention .  The patient's history has been reviewed, patient examined, no change in status, stable for surgery.  I have reviewed the patient's chart and labs.  Questions were answered to the patient's satisfaction.     [place]

## 2017-10-23 NOTE — Anesthesia Postprocedure Evaluation (Signed)
Anesthesia Post Note  Patient: Yesenia Wheeler  Procedure(s) Performed: BREAST LUMPECTOMY WITH RADIOACTIVE SEED LOCALIZATION (Left Breast) LEFT BREAST ONCOPLASTIC REDUCTION, RIGHT BREAST REDUCTION (Bilateral Breast)     Patient location during evaluation: PACU Anesthesia Type: General Level of consciousness: awake and alert Pain management: pain level controlled Vital Signs Assessment: post-procedure vital signs reviewed and stable Respiratory status: spontaneous breathing, nonlabored ventilation, respiratory function stable and patient connected to nasal cannula oxygen Cardiovascular status: blood pressure returned to baseline and stable Postop Assessment: no apparent nausea or vomiting Anesthetic complications: no    Last Vitals:  Vitals:   10/23/17 1351 10/23/17 1430  BP: 102/81 129/89  Pulse: 70 72  Resp: 14 14  Temp: (!) 36.4 C   SpO2: 100% 100%    Last Pain:  Vitals:   10/23/17 1430  TempSrc:   PainSc: 7                  Miki Labuda EDWARD

## 2017-10-23 NOTE — Op Note (Signed)
Preoperative diagnosis: Left breast mass/complex sclerosing lesion  Postoperative diagnosis: Same  Procedure: Left breast seed localized lumpectomy  Surgeon: Erroll Luna MD  Anesthesia: General  EBL: 10 cc  Specimen: Left breast tissue with seed and clip verified by radiography  Drains: None  IV fluids: Per anesthesia record  Indications for procedure: The patient is a 47 year old female who underwent mammography which showed a left breast mass.  Core biopsy showed complex sclerosing lesion.  We discussed removal of this and its implications.  The pros and cons of surgery were discussed.  The risk of malignancy was discussed with the patient as well.  She had extremely large ptotic breasts and desires breast reduction.  She was seen by plastic surgery and approved for breast reduction.  This procedure was done with plastic surgery with them to follow with breast reduction after lumpectomy.  The patient was seen by plastic surgery and all incision lines were marked by the plastic surgeon.  Risk of surgery discussed pertaining to lumpectomy.The procedure has been discussed with the patient. Alternatives to surgery have been discussed with the patient.  Risks of surgery include bleeding,  Infection,  Seroma formation, death,  and the need for further surgery.   The patient understands and wishes to proceed.   Description of procedure: The patient was met in the holding area.  The left breast was marked as the correct side.  She had been seen by plastic surgery and had been marked for the breast reduction as well.  Questions were answered.  She was taken back to the operating room and placed upon the OR table.  After induction of general anesthesia, both sides were prepped and draped in sterile fashion and timeout was done to verify procedure, patient and side.  Her films were available in the operating room for review.  Neoprobe was used and the seed and clip were identified in the left breast  upper outer quadrant.  Incision was made along a previously drawn line by the plastic surgeon for breast reduction.  Dissection was carried down all tissue around the seed and clip were excised with a grossly negative margin.  The cavity was clipped for further reference in the future.  Radiograph revealed seed and clip to be in the specimen.  The cavity was made hemostatic and packed with a dry dressing.  The remainder of the procedure was done by plastic surgery and will be dictated separately.  All final counts were found to be correct and the patient was stable.

## 2017-10-23 NOTE — Transfer of Care (Signed)
Immediate Anesthesia Transfer of Care Note  Patient: Yesenia Wheeler  Procedure(s) Performed: BREAST LUMPECTOMY WITH RADIOACTIVE SEED LOCALIZATION (Left Breast) LEFT BREAST ONCOPLASTIC REDUCTION, RIGHT BREAST REDUCTION (Bilateral Breast)  Patient Location: PACU  Anesthesia Type:General  Level of Consciousness: awake, alert  and oriented  Airway & Oxygen Therapy: Patient Spontanous Breathing and Patient connected to face mask oxygen  Post-op Assessment: Report given to RN and Post -op Vital signs reviewed and stable  Post vital signs: Reviewed and stable  Last Vitals:  Vitals:   10/23/17 0718 10/23/17 1351  BP: 112/75 102/81  Pulse: 63 70  Resp: 18 14  Temp: 36.6 C (!) (P) 36.4 C  SpO2: 100% 100%    Last Pain:  Vitals:   10/23/17 0718  TempSrc: Oral         Complications: No apparent anesthesia complications

## 2017-10-23 NOTE — H&P (Signed)
[place]  Location: [facility] Patient #: [id number] DOB: [birth year] Married / Language: English / Race: Black or African American Female  History of Present Illness  Patient words: Patient sent at the request of Dr. Rosana Hoes for left breast mass. The patient went screening mammogram was found to have a new left breast mass measuring 1.6 cm. Core biopsy showed a complex sclerosing lesion. She also underwent a biopsy of an abnormal-appearing left axillary lymph node which was benign. She has a history of large pendulous painful breast. She also has back pain, neck pain and shoulder pain. She was evaluated for lateral breast reduction but there were some issues with her insurance and this could not be done. She denies any nipple discharge or breast mass. No history of cancer.                                Diagnosis 1. Breast, left, needle core biopsy, 2:30 o'clock - COMPLEX SCLEROSING LESION WITH CALCIFICATIONS AND USUAL DUCTAL HYPERPLASIA. 2. Lymph node, needle/core biopsy, left axilla - BENIGN LYMPH NODE. - NO METASTATIC CARCINOMA IDENTIFIED. Microscopic Comment 1. Called to [facility] on 07/13/2017. (JDP:ecj 07/13/2017)  CLINICAL DATA: 47 year old female presenting for delayed follow-up of a probably benign left breast mass. Of note, the patient states that within the past several months she has had new intermittent shooting pains in the lateral left breast.  EXAM: 2D DIGITAL DIAGNOSTIC UNILATERAL LEFT MAMMOGRAM WITH CAD AND ADJUNCT TOMO  LEFT BREAST ULTRASOUND  COMPARISON: Previous exam(s).  ACR Breast Density Category b: There are scattered areas of fibroglandular density.  FINDINGS: The mass previously noted in the left breast at 10 o'clock appears mammographically stable. There is however a new mass identified in the lower outer quadrant of the  left breast, posterior depth which measures up to 1.6 cm. No other suspicious calcifications, masses or areas of distortion are seen in the left breast.  Mammographic images were processed with CAD.  Ultrasound targeted to the left breast at 10 o'clock, 8 cm from the nipple demonstrates a stable oval circumscribed hypoechoic mass measuring 1.1 x 0.7 x 0.9 cm, previously measuring 0.9 x 0.8 x 0.8 cm.  Ultrasound of the left breast at 2:30, 15 cm from the nipple demonstrates a mixed cystic and solid mass measuring 1.9 x 0.8 x 1.0 cm. This likely corresponds with the new mass identified mammographically.  Additionally, there are innumerable smaller circumscribed oval masses identified in the upper-outer and lower-outer quadrants, with a few representative images acquired.  Ultrasound of the left axilla demonstrates 2 adjacent prominent lymph nodes. The largest has a cortex measuring up to 0.6 cm.  IMPRESSION: 1. The probably benign mass in the left breast at 10 o'clock is stable.  2. There is a new complex mass in the left breast at 230 measuring 1.9 cm.  3. There are 2 adjacent enlarged indeterminate left axillary lymph nodes.  4. Although there are numerous small benign-appearing masses seen in the limited ultrasound which was performed of the lateral aspect of the left breast, there are clearly numerous masses in the right breast as well seen on the prior mammogram. 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 Therefore follow-up of the adjacent multiple masses identified in the lateral left breast is not necessary.  RECOMMENDATION: 1. Ultrasound-guided biopsy is recommended for the left breast mass at 2:30, and for  the largest of the left axillary lymph nodes.  2. Note that the patient was not able to have the right breast mammogram until November 27th, and therefore still needs right breast mammogram images. The  should be performed the same day as her biopsy.  3. If the above biopsies are benign, the patient will need a 12 month follow-up bilateral diagnostic mammogram to ensure stability of the mass in the left breast at 10 o'clock. Alternatively, she could have this mass biopsied, in which case she would not require follow-up.  I have discussed the findings and recommendations with the patient. Results were also provided in writing at the conclusion of the visit. If applicable, a reminder letter will be sent to the patient regarding the next appointment.  BI-RADS CATEGORY 4: Suspicious.   CLINICAL DATA: Patient status post ultrasound-guided core needle biopsy left breast mass and left axillary lymph node. EXAM: DIAGNOSTIC LEFT MAMMOGRAM POST ULTRASOUND BIOPSY COMPARISON: Previous exam(s). FINDINGS: Mammographic images were obtained following ultrasound guided biopsy of left breast mass and left axillary lymph node. Ribbon shaped marking clip in appropriate position within the upper-outer left breast at the site of biopsied mass. HydroMARK clip within the left axilla in appropriate position. IMPRESSION: Appropriate position ribbon shaped marking clip and HydroMARK clip status post ultrasound-guided core needle biopsy left breast mass and left axillary lymph node. Final Assessment: Post Procedure Mammograms for Marker Placement Electronically Signed By: Lovey Newcomer M.D. On: 07/12/2017 11:17 .  The patient is a 47 year old female.   Past Surgical History  Breast Biopsy Left. Cesarean Section - Multiple Gallbladder Surgery - Laparoscopic Gastric Bypass Knee Surgery Right. Shoulder Surgery Left.  Diagnostic Studies History  Colonoscopy never Mammogram within last year Pap Smear 1-5 years ago  Allergies (Tanisha A. Owens Shark, [place];  No Known Drug Allergies Allergies Reconciled  Medication History (Tanisha A. Owens Shark, Albuterol (90MCG/ACT [employer],  Inhalation) Active. Tessalon Perles (100MG  Capsule, Oral) Active. Carisoprodol (350MG  Tablet, Oral) Active. Valium (5MG  Tablet, Oral) Active. Fluconazole (150MG  Tablet, Oral) Active. Ibuprofen (800MG  Tablet, Oral) Active. Naproxen (500MG  Tablet, Oral) Active. Ondansetron (4MG  Tablet Disint, Oral) Active. Zoloft (25MG  Tablet, Oral) Active. TraMADol HCl (50MG  Tablet, Oral) Active. Medications Reconciled  Social History (Tanisha A. Owens Shark, [place];  Alcohol use Occasional alcohol use. Caffeine use Coffee. No drug use Tobacco use Former smoker.  Family History (Tanisha A. Owens Shark, [place];  Alcohol Abuse Father. Arthritis Mother. Cancer Father. Depression Mother. Heart disease in female family member before age 65  Pregnancy / Birth History (Tanisha A. Owens Shark, [place];  Age at menarche 80 years. Gravida 10 Length (months) of breastfeeding 3-6 Maternal age 36-25 Para 2 Regular periods  Other Problems (Tanisha A. Owens Shark, [place]; Anxiety Disorder Arthritis Asthma Back Pain Depression Hemorrhoids Lump In Breast Migraine Headache     Review of Systems (Tanisha A. Owens Shark RMA;  General Present- Fatigue. Not Present- Appetite Loss, Chills, Fever, Night Sweats, Weight Gain and Weight Loss. Skin Present- Dryness. Not Present- Change in Wart/Mole, Hives, Jaundice, New Lesions, Non-Healing Wounds, Rash and Ulcer. HEENT Present- Wears glasses/contact lenses. Not Present- Earache, Hearing Loss, Hoarseness, Nose Bleed, Oral Ulcers, Ringing in the Ears, Seasonal Allergies, Sinus Pain, Sore Throat, Visual Disturbances and Yellow Eyes. Respiratory Not Present- Bloody sputum, Chronic Cough, Difficulty Breathing, Snoring and Wheezing. Breast Present- Breast Mass and Breast Pain. Not Present- Nipple Discharge and Skin Changes. Cardiovascular Not Present- Chest Pain, Difficulty Breathing Lying Down, Leg Cramps, Palpitations, Rapid Heart Rate, Shortness of Breath and  Swelling of Extremities. Gastrointestinal  Present- Hemorrhoids. Not Present- Abdominal Pain, Bloating, Bloody Stool, Change in Bowel Habits, Chronic diarrhea, Constipation, Difficulty Swallowing, Excessive gas, Gets full quickly at meals, Indigestion, Nausea, Rectal Pain and Vomiting. Female Genitourinary Not Present- Frequency, Nocturia, Painful Urination, Pelvic Pain and Urgency. Musculoskeletal Present- Back Pain, Joint Pain, Joint Stiffness and Swelling of Extremities. Not Present- Muscle Pain and Muscle Weakness. Neurological Present- Headaches. Not Present- Decreased Memory, Fainting, Numbness, Seizures, Tingling, Tremor, Trouble walking and Weakness. Psychiatric Present- Change in Sleep Pattern and Depression. Not Present- Anxiety, Bipolar, Fearful and Frequent crying. Endocrine Not Present- Cold Intolerance, Excessive Hunger, Hair Changes, Heat Intolerance, Hot flashes and New Diabetes. Hematology Not Present- Easy Bruising, Excessive bleeding, Gland problems, HIV and Persistent Infections.  Vitals (Tanisha A. Brown RMA; 07/30/2017 9:24 AM Weight: 234 lb Height: 63in Body Surface Area: 2.07 m Body Mass Index: 41.45 kg/m  Temp.: 98.23F  Pulse: 78 (Regular)  BP: 128/84 (Sitting, Left Arm, Standard)      Physical Exam  General Mental Status-Alert. General Appearance-Consistent with stated age. Hydration-Well hydrated. Voice-Normal.  Head and Neck Head-normocephalic, atraumatic with no lesions or palpable masses. Trachea-midline. Thyroid Gland Characteristics - normal size and consistency.  Chest and Lung Exam Chest and lung exam reveals -quiet, even and easy respiratory effort with no use of accessory muscles and on auscultation, normal breath sounds, no adventitious sounds and normal vocal resonance. Inspection Chest Wall - Normal. Back - normal.  Breast Note: Large pendulous breasts. No masses in either breast. Both nipples  appeared normal.  Cardiovascular Cardiovascular examination reveals -normal heart sounds, regular rate and rhythm with no murmurs and normal pedal pulses bilaterally.  Neurologic Neurologic evaluation reveals -alert and oriented x 3 with no impairment of recent or remote memory. Mental Status-Normal.  Lymphatic Head & Neck  General Head & Neck Lymphatics: Bilateral - Description - Normal. Axillary  General Axillary Region: Bilateral - Description - Normal. Tenderness - Non Tender.    Assessment & Plan   LEFT BREAST MASS (N63.20) Impression: Recommend left breast seed Risk of lumpectomy include bleeding, infection, seroma, more surgery, use of seed/wire, wound care, cosmetic deformity and the need for other treatments, death , blood clots, death. Pt agrees to proceed. localized lumpectomy for complex sclerosing rate lesion due to low but present risk of malignancy in this situation. The patient would benefit from bilateral breast reduction and I will refer to plastics to see if this can be done concomitantly.  Current Plans You are being scheduled for surgery- Our schedulers will call you.  You should hear from our office's scheduling department within 5 working days about the location, date, and time of surgery. We try to make accommodations for patient's preferences in scheduling surgery, but sometimes the OR schedule or the surgeon's schedule prevents [place] from making those accommodations.  If you have not heard from our office [phone]) in 5 working days, call the office and ask for your surgeon's nurse.  If you have other questions about your diagnosis, plan, or surgery, call the office and ask for your surgeon's nurse.  Pt Education - [facility] Breast Biopsy HCI: discussed with patient and provided information. The anatomy and the physiology was discussed. The pathophysiology and natural history of the disease was discussed. Options were discussed and  recommendations were made. Technique, risks, benefits, & alternatives were discussed. Risks such as stroke, heart attack, bleeding, indection, death, and other risks discussed. Questions answered. The patient agrees to proceed.  BILATERAL MASTODYNIA (N64.4) Impression: needs bilateral breast reduction refer to  Dr Iran Planas for evaluation

## 2017-10-24 DIAGNOSIS — N6489 Other specified disorders of breast: Secondary | ICD-10-CM | POA: Diagnosis not present

## 2017-10-24 MED ORDER — ENOXAPARIN SODIUM 40 MG/0.4ML ~~LOC~~ SOLN: 40.0000 mg | SUBCUTANEOUS | 0 refills | Status: DC

## 2017-10-24 MED ORDER — OXYCODONE HCL 5 MG PO TABS: 5.0000 mg | ORAL_TABLET | ORAL | 0 refills | Status: DC | PRN

## 2017-10-24 NOTE — Discharge Instructions (Signed)
NO IBUPROFEN/ADVIL UNTIL AFTER 2PM!!   Post Anesthesia Home Care Instructions  Activity: Get plenty of rest for the remainder of the day. A responsible individual must stay with you for 24 hours following the procedure.  For the next 24 hours, DO NOT: -Drive a car -Paediatric nurse -Drink alcoholic beverages -Take any medication unless instructed by your physician -Make any legal decisions or sign important papers.  Meals: Start with liquid foods such as gelatin or soup. Progress to regular foods as tolerated. Avoid greasy, spicy, heavy foods. If nausea and/or vomiting occur, drink only clear liquids until the nausea and/or vomiting subsides. Call your physician if vomiting continues.  Special Instructions/Symptoms: Your throat may feel dry or sore from the anesthesia or the breathing tube placed in your throat during surgery. If this causes discomfort, gargle with warm salt water. The discomfort should disappear within 24 hours.  If you had a scopolamine patch placed behind your ear for the management of post- operative nausea and/or vomiting:  1. The medication in the patch is effective for 72 hours, after which it should be removed.  Wrap patch in a tissue and discard in the trash. Wash hands thoroughly with soap and water. 2. You may remove the patch earlier than 72 hours if you experience unpleasant side effects which may include dry mouth, dizziness or visual disturbances. 3. Avoid touching the patch. Wash your hands with soap and water after contact with the patch.

## 2017-10-25 ENCOUNTER — Encounter (HOSPITAL_BASED_OUTPATIENT_CLINIC_OR_DEPARTMENT_OTHER): Payer: Self-pay | Admitting: Surgery

## 2017-10-26 ENCOUNTER — Other Ambulatory Visit: Payer: Self-pay

## 2017-10-26 ENCOUNTER — Emergency Department (HOSPITAL_COMMUNITY)
Admission: EM | Admit: 2017-10-26 | Discharge: 2017-10-27 | Disposition: A | Discharge status: Home / Self Care | Payer: BLUE CROSS/BLUE SHIELD | Attending: Emergency Medicine | Admitting: Emergency Medicine

## 2017-10-26 DIAGNOSIS — R6883 Chills (without fever): Secondary | ICD-10-CM | POA: Diagnosis not present

## 2017-10-26 DIAGNOSIS — Z79899 Other long term (current) drug therapy: Secondary | ICD-10-CM | POA: Diagnosis not present

## 2017-10-26 DIAGNOSIS — N644 Mastodynia: Secondary | ICD-10-CM | POA: Diagnosis present

## 2017-10-26 DIAGNOSIS — Z87891 Personal history of nicotine dependence: Secondary | ICD-10-CM | POA: Insufficient documentation

## 2017-10-26 DIAGNOSIS — R51 Headache: Secondary | ICD-10-CM | POA: Insufficient documentation

## 2017-10-26 DIAGNOSIS — J45909 Unspecified asthma, uncomplicated: Secondary | ICD-10-CM | POA: Diagnosis not present

## 2017-10-26 DIAGNOSIS — G8918 Other acute postprocedural pain: Secondary | ICD-10-CM | POA: Diagnosis not present

## 2017-10-26 LAB — CBC WITH DIFFERENTIAL/PLATELET
Basophils Absolute: 0 10*3/uL (ref 0.0–0.1)
Basophils Relative: 0 %
EOS ABS: 0.5 10*3/uL (ref 0.0–0.7)
Eosinophils Relative: 6 %
HCT: 32.7 % — ABNORMAL LOW (ref 36.0–46.0)
HEMOGLOBIN: 10.5 g/dL — AB (ref 12.0–15.0)
LYMPHS ABS: 2.3 10*3/uL (ref 0.7–4.0)
Lymphocytes Relative: 25 %
MCH: 26.4 pg (ref 26.0–34.0)
MCHC: 32.1 g/dL (ref 30.0–36.0)
MCV: 82.2 fL (ref 78.0–100.0)
MONOS PCT: 6 %
Monocytes Absolute: 0.5 10*3/uL (ref 0.1–1.0)
NEUTROS PCT: 63 %
Neutro Abs: 5.6 10*3/uL (ref 1.7–7.7)
Platelets: 229 10*3/uL (ref 150–400)
RBC: 3.98 MIL/uL (ref 3.87–5.11)
RDW: 15.6 % — ABNORMAL HIGH (ref 11.5–15.5)
WBC: 8.9 10*3/uL (ref 4.0–10.5)

## 2017-10-26 LAB — COMPREHENSIVE METABOLIC PANEL
ALT: 44 U/L (ref 14–54)
AST: 29 U/L (ref 15–41)
Albumin: 3.2 g/dL — ABNORMAL LOW (ref 3.5–5.0)
Alkaline Phosphatase: 115 U/L (ref 38–126)
Anion gap: 10 (ref 5–15)
BUN: 7 mg/dL (ref 6–20)
CHLORIDE: 107 mmol/L (ref 101–111)
CO2: 21 mmol/L — AB (ref 22–32)
CREATININE: 0.89 mg/dL (ref 0.44–1.00)
Calcium: 8.3 mg/dL — ABNORMAL LOW (ref 8.9–10.3)
GFR calc non Af Amer: >60 mL/min (ref >60)
Glucose, Bld: 87 mg/dL (ref 65–99)
Potassium: 3.7 mmol/L (ref 3.5–5.1)
SODIUM: 138 mmol/L (ref 135–145)
Total Bilirubin: 0.6 mg/dL (ref 0.3–1.2)
Total Protein: 6.4 g/dL — ABNORMAL LOW (ref 6.5–8.1)

## 2017-10-26 MED ORDER — CEPHALEXIN 250 MG PO CAPS
500.0000 mg | ORAL_CAPSULE | Freq: Once | ORAL | Status: AC
  Administered 2017-10-26: 500 mg via ORAL
  Filled 2017-10-26: qty 2

## 2017-10-26 MED ORDER — PROCHLORPERAZINE EDISYLATE 5 MG/ML IJ SOLN
10.0000 mg | Freq: Once | INTRAMUSCULAR | Status: AC
  Administered 2017-10-26: 10 mg via INTRAVENOUS
  Filled 2017-10-26: qty 2

## 2017-10-26 MED ORDER — SODIUM CHLORIDE 0.9 % IV BOLUS (SEPSIS)
1000.0000 mL | Freq: Once | INTRAVENOUS | Status: AC
  Administered 2017-10-26: 1000 mL via INTRAVENOUS

## 2017-10-26 MED ORDER — CEPHALEXIN 500 MG PO CAPS: 500.0000 mg | ORAL_CAPSULE | Freq: Two times a day (BID) | ORAL | 0 refills | Status: AC

## 2017-10-26 MED ORDER — HYDROMORPHONE HCL 1 MG/ML IJ SOLN
1.0000 mg | Freq: Once | INTRAMUSCULAR | Status: AC
  Administered 2017-10-26: 1 mg via INTRAVENOUS
  Filled 2017-10-26: qty 1

## 2017-10-26 MED ORDER — DIPHENHYDRAMINE HCL 50 MG/ML IJ SOLN
25.0000 mg | Freq: Once | INTRAMUSCULAR | Status: AC
  Administered 2017-10-26: 25 mg via INTRAVENOUS
  Filled 2017-10-26: qty 1

## 2017-10-26 NOTE — ED Provider Notes (Signed)
Patient placed in Quick Look pathway, seen and evaluated   Chief Complaint: post surgical breast pain  HPI:   Hx lumpectomy and BL breast reduction surgery. Was putting out about 25 ML serosangionous dc in he JP bl. Today putting out double the volume with increased pain uncontrolled on opiate.  I spoke with Dr. Festus Holts who is on call today.  She states that the patient had increased activity she has shifted to increase fluids.  She is not on any antibiotics she may be started on Keflex.  And she may be seen very early this week barring there are no other complications during this evaluation.  She is currently taking 5-10 mg of oxycodone but states that it was not helping so she stopped taking the medication.  ROS:  Breast pain (one)  Physical Exam:   Gen: No distress  Neuro: Awake and Alert  Skin: Warm    Focused Exam: Bilateral breast reduction surgery with well-healing wounds.  Mild erythema along the incision sites.  JP drains in place with serosanguineous drainage.  No active bleeding no purulence.  Initiation of care has begun. The patient has been counseled on the process, plan, and necessity for staying for the completion/evaluation, and the remainder of the medical screening examination    Margarita Mail, PA-C 10/26/17 [place], Grayce Sessions, MD 10/27/17 [phone]

## 2017-10-26 NOTE — ED Provider Notes (Signed)
[facility] EMERGENCY DEPARTMENT Provider Note   CSN: [id number] Arrival date & time: 10/26/17  1724     History   Chief Complaint Chief Complaint  Patient presents with  . Breast Pain    HPI Yesenia Wheeler is a 47 y.o. female.  The history is provided by the patient.  Patient is a 47 year old female with history of DVT, breast mass, migraines, arthritis who presents with postoperative breast pain bilaterally.  Patient is postop day 3 from bilateral lumpectomy and breast reduction surgery.  She reports increase drainage from her JP drains.  She states the oxycodone that she was prescribed is not controlling the pain adequately at home.  She also states she has had a migraine all day long.  Headache consistent with prior headaches.  She denies any fevers but has had some chills.  She denies any purulence draining from any of the surgical sites were in the JP drain.  Pain is severe and does not radiate.  Past Medical History:  Diagnosis Date  . Acute meniscal tear of left knee   . Anemia   . 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 Migraines   . Mild asthma   . OA (osteoarthritis)    bilateral knees    Patient Active Problem List   Diagnosis Date Noted  . Left breast mass 10/23/2017  . Acute medial meniscus tear of left knee 08/30/2017  . BV (bacterial vaginosis) 06/23/2015  . Tinea pedis 06/21/2015  . Screening-pulmonary TB 03/15/2015  . Obesity (BMI 30-39.9) 05/26/2014  . Asthma, chronic 05/25/2014  . Migraine 05/25/2014  . Chronic back pain 05/25/2014    Past Surgical History:  Procedure Laterality Date  . BREAST LUMPECTOMY WITH RADIOACTIVE SEED LOCALIZATION Left 10/23/2017   Procedure: BREAST LUMPECTOMY WITH RADIOACTIVE SEED LOCALIZATION;   Surgeon: Erroll Luna, MD;  Location: [facility];  Service: General;  Laterality: Left;  . BREAST REDUCTION SURGERY Bilateral 10/23/2017   Procedure: LEFT BREAST ONCOPLASTIC REDUCTION, RIGHT BREAST REDUCTION;  Surgeon: Irene Limbo, MD;  Location: [facility];  Service: Plastics;  Laterality: Bilateral;  . CESAREAN SECTION  2011  . GASTRIC BYPASS  2001  . KNEE ARTHROSCOPY W/ MENISCECTOMY Right 2007  . KNEE ARTHROSCOPY WITH MEDIAL MENISECTOMY Left 08/30/2017   Procedure: KNEE ARTHROSCOPY WITH PARTIAL MEDIAL MENISECTOMY;  Surgeon: Nicholes Stairs, MD;  Location: [facility];  Service: Orthopedics;  Laterality: Left;  60 mins  . LAPAROSCOPIC CHOLECYSTECTOMY  2001  . SHOULDER ARTHROSCOPY Left 2008   spur    OB History    Gravida Para Term Preterm AB Living   13       13 3    SAB TAB Ectopic Multiple Live Births   13               Home Medications    Prior to Admission medications   Medication Sig Start Date End Date Taking? Authorizing Provider  albuterol (PROAIR HFA) 108 (90 Base) MCG/ACT inhaler Inhale into the lungs every 6 (six) hours as needed for wheezing or shortness of breath.   Yes [provider]  Cholecalciferol (EQL VITAMIN D3 GUMMIES PO) Take 1 tablet by mouth daily.    Yes [provider]  enoxaparin (LOVENOX) 40 MG/0.4ML injection Inject 0.4 mLs (40 mg total) into the skin daily. Start 3.14.19 10/24/17  Yes Irene Limbo, MD  metaxalone (SKELAXIN) 800 MG tablet Take 800 mg by mouth 3 (three) times daily as needed for muscle spasms.   Yes [provider]  mirtazapine (REMERON) 7.5 MG tablet Take 7.5 mg by mouth at bedtime as needed (sleep).    Yes [provider]  ondansetron (ZOFRAN ODT) 4 MG disintegrating tablet Take 1 tablet (4 mg total) by mouth every 8 (eight) hours as needed for nausea or vomiting. 08/30/17  Yes Nicholes Stairs, MD  oxyCODONE (OXY IR/ROXICODONE) 5 MG  immediate release tablet Take 1-2 tablets (5-10 mg total) by mouth every 4 (four) hours as needed for moderate pain. 10/24/17  Yes Irene Limbo, MD  cephALEXin (KEFLEX) 500 MG capsule Take 1 capsule (500 mg total) by mouth 2 (two) times daily for 5 days. 10/26/17 10/31/17  Tobie Poet, DO    Family History Family History  Problem Relation Age of Onset  . Cancer Father        oral    Social History Social History   Tobacco Use  . Smoking status: Former Smoker    Years: 6.00    Types: Cigarettes    Last attempt to quit: 06/23/2017    Years since quitting: 0.3  . Smokeless tobacco: Never Used  . Tobacco comment: 2ppwk  Substance Use Topics  . Alcohol use: Yes    Comment: occasional  . Drug use: No     Allergies   Patient has no known allergies.   Review of Systems Review of Systems  Constitutional: Positive for chills. Negative for fever.  HENT: Negative for ear pain and sore throat.   Eyes: Negative for visual disturbance.  Respiratory: Negative for cough and shortness of breath.   Cardiovascular: Negative for chest pain and palpitations.  Gastrointestinal: Negative for abdominal pain and vomiting.  Musculoskeletal: Negative for back pain and neck pain.  Skin: Positive for color change.  Neurological: Negative for headaches.  All other systems reviewed and are negative.    Physical Exam Updated Vital Signs BP 109/82 (BP Location: Right Arm)   Pulse 81   Temp 98.4 F (36.9 C) (Oral)   Resp 16   Ht 5\' 4"  (1.626 m)   Wt 99.8 kg (220 lb)   LMP 10/16/2017   SpO2 99%   BMI 37.76 kg/m   Physical Exam  Constitutional: She is oriented to person, place, and time. She appears well-developed and well-nourished. No distress.  HENT:  Head: Normocephalic and atraumatic.  Mouth/Throat: Oropharynx is clear and moist.  Eyes: Conjunctivae are normal.  Neck: Neck supple.  Cardiovascular: Normal rate and regular rhythm.  No murmur heard. Pulmonary/Chest: Effort  normal and breath sounds normal. No respiratory distress. She has no wheezes. She has no rales. Right breast exhibits tenderness. Left breast exhibits tenderness. There is breast swelling.  Erythema and bruising around vertical incision sites under nipple. Mildly warm to touch.     Abdominal: Soft. She exhibits no distension. There is no tenderness. There is no guarding.  Genitourinary: There is breast tenderness. No breast bleeding.  Musculoskeletal: She exhibits no edema.  Neurological: She is alert and oriented to person, place, and time.  Skin: Skin is warm and dry.  Psychiatric: She has a normal mood and affect.  Nursing note and vitals reviewed.    ED Treatments / Results  Labs (all labs ordered are listed, but only  abnormal results are displayed) Labs Reviewed  COMPREHENSIVE METABOLIC PANEL - Abnormal; Notable for the following components:      Result Value   CO2 21 (*)    Calcium 8.3 (*)    Total Protein 6.4 (*)    Albumin 3.2 (*)    All other components within normal limits  CBC WITH DIFFERENTIAL/PLATELET - Abnormal; Notable for the following components:   Hemoglobin 10.5 (*)    HCT 32.7 (*)    RDW 15.6 (*)    All other components within normal limits    EKG  EKG Interpretation None       Radiology No results found.  Procedures Procedures (including critical care time)  Medications Ordered in ED Medications  HYDROmorphone (DILAUDID) injection 1 mg (1 mg Intravenous Given 10/26/17 2236)  prochlorperazine (COMPAZINE) injection 10 mg (10 mg Intravenous Given 10/26/17 2238)  diphenhydrAMINE (BENADRYL) injection 25 mg (25 mg Intravenous Given 10/26/17 2234)  sodium chloride 0.9 % bolus 1,000 mL (0 mLs Intravenous Stopped 10/26/17 2330)  cephALEXin (KEFLEX) capsule 500 mg (500 mg Oral Given 10/26/17 2201)     Initial Impression / Assessment and Plan / ED Course  I have reviewed the triage vital signs and the nursing notes.  Pertinent labs & imaging results that  were available during my care of the patient were reviewed by me and considered in my medical decision making (see chart for details).     Patient is a 47 year old female with history as above who presents with postop breast pain.  Refer to HPI as above.  Next  On exam bilateral breasts appear swollen and edematous.  They are tender to palpation.  There is no purulence draining from any of the surgical sites or her JP drains.  Serosanguineous fluid draining from the JP drains.  There is some evidence of ecchymosis and mild erythema surrounding the vertical surgical sites.  This is likely just bruising and inflammatory change postoperative [place], however given increase in pain as well as physical exam findings we will go ahead and start a course of Keflex to treat any possible cellulitis.  Next  I spoke with the on-call plastic surgeon and she agreed with the Keflex.  Patient is to call on Monday to schedule a follow-up appointment early next week.  Patient's pain controlled with 1 dose of Dilaudid.  Patient given Compazine and Benadryl and normal saline bolus for her migraine headache which improved that as well.  First dose of Keflex given in the ED.  Patient discharged in good condition.  Patient also instructed to supplement her oxycodone with acetaminophen to help obtain better pain control.  Final Clinical Impressions(s) / ED Diagnoses   Final diagnoses:  Post-op pain  Breast pain    ED Discharge Orders        Ordered    cephALEXin (KEFLEX) 500 MG capsule  2 times daily     10/26/17 2306       Tobie Poet, DO 10/26/17 2340    Fatima Blank, MD 10/27/17 [phone]

## 2017-10-26 NOTE — ED Notes (Signed)
Patient verbalizes understanding of discharge instructions. Opportunity for questioning and answers were provided. Armband removed by staff, pt discharged from ED via wheelchiair.

## 2017-10-26 NOTE — Discharge Instructions (Signed)
In addition to your prescribed oxycodone, start taking extra strength Tylenol every 6 hours to help with your pain.  Call the plastic surgery office on Monday for appointment early next week.  Take the antibiotics as prescribed.

## 2017-10-26 NOTE — ED Triage Notes (Signed)
Per Pt she is from home and comes to [facility] complaining of bilateral breast pain.  She has a recent history of a double lumpectomy this past Tuesday.  She has bilateral JP drains in place and the last two hours she has gotten 50cc of fluid out of each side.  She is complaining of pain at the insertion site.  Incision appear red.  NAD noted at this time AOx4.

## 2018-01-23 ENCOUNTER — Encounter (HOSPITAL_COMMUNITY): Payer: Self-pay | Admitting: Emergency Medicine

## 2018-01-23 ENCOUNTER — Emergency Department (HOSPITAL_COMMUNITY)
Admission: EM | Admit: 2018-01-23 | Discharge: 2018-01-23 | Disposition: A | Discharge status: Home / Self Care | Payer: BLUE CROSS/BLUE SHIELD | Attending: Emergency Medicine | Admitting: Emergency Medicine

## 2018-01-23 ENCOUNTER — Other Ambulatory Visit: Payer: Self-pay

## 2018-01-23 DIAGNOSIS — Z87891 Personal history of nicotine dependence: Secondary | ICD-10-CM | POA: Insufficient documentation

## 2018-01-23 DIAGNOSIS — J45909 Unspecified asthma, uncomplicated: Secondary | ICD-10-CM | POA: Insufficient documentation

## 2018-01-23 DIAGNOSIS — Z79899 Other long term (current) drug therapy: Secondary | ICD-10-CM | POA: Insufficient documentation

## 2018-01-23 DIAGNOSIS — M25562 Pain in left knee: Secondary | ICD-10-CM | POA: Insufficient documentation

## 2018-01-23 DIAGNOSIS — G8929 Other chronic pain: Secondary | ICD-10-CM

## 2018-01-23 NOTE — ED Triage Notes (Addendum)
Pt. Stated, I had left knee surgery inJanuary andits still having swelling and painful

## 2018-01-23 NOTE — Discharge Instructions (Signed)
You may take Tylenol 650 every 6 hours for your symptoms.  You may also take the naproxen that you have at home for your pain.  Please wear the knee immobilizer and use crutches for comfort.  You were given information for an orthopedic doctor whom you may follow-up with if you are continuing to have pain.  Please follow-up with your primary doctor in 1 week for reevaluation and return to the ER if you have any new or worsening symptoms in the meantime.

## 2018-01-23 NOTE — Progress Notes (Signed)
Orthopedic Tech Progress Note Patient Details:  Yesenia Wheeler [facility] [birth year] [id number]  Ortho Devices Type of Ortho Device: Crutches, Knee Immobilizer Ortho Device/Splint Location: lle Ortho Device/Splint Interventions: Application   Post Interventions Patient Tolerated: Well Instructions Provided: Care of device   [name] 01/23/2018, 12:24 PM

## 2018-01-23 NOTE — ED Notes (Signed)
Ortho tech returned page, will place immobilizer and crutches.

## 2018-01-23 NOTE — ED Provider Notes (Addendum)
[place] EMERGENCY DEPARTMENT Provider Note   CSN: [id number] Arrival date & time: 01/23/18  0944     History   Chief Complaint Chief Complaint  Patient presents with  . Knee Pain    HPI Yesenia Wheeler is a 47 y.o. female.  HPI  47 year old female presents the emergency department today complaining of left knee pain and swelling that has been ongoing for the last 5 months since she had a meniscal repair on the left side.  She denies any changes in her symptoms.  Symptoms have remained persistent with swelling and pain.  Denies acute worsening.  Today's pain is worse to the anterior portion of her knee.  She has tried naproxen and cold compresses.  She also had prescription for OxyContin after she had her surgery.  She states she has another refill of this at the pharmacy however she has not wanted to refill it.  She has not followed up with her orthopedist yet.  She has an appointment next month.  She denies any fevers, redness or warmth to the left knee.  No history of diabetes.  No recent falls or injuries.  No chest pain, shortness of breath, calf swelling or pain in the calves.  Past Medical History:  Diagnosis Date  . Acute meniscal tear of left knee   . Anemia   . 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 Migraines   . Mild asthma   . OA (osteoarthritis)    bilateral knees    Patient Active Problem List   Diagnosis Date Noted  . Left breast mass 10/23/2017  . Acute medial meniscus tear of left knee 08/30/2017  . BV (bacterial vaginosis) 06/23/2015  . Tinea pedis 06/21/2015  . Screening-pulmonary TB 03/15/2015  . Obesity (BMI 30-39.9) 05/26/2014  . Asthma, chronic 05/25/2014  . Migraine 05/25/2014  . Chronic back pain 05/25/2014     Past Surgical History:  Procedure Laterality Date  . BREAST LUMPECTOMY WITH RADIOACTIVE SEED LOCALIZATION Left 10/23/2017   Procedure: BREAST LUMPECTOMY WITH RADIOACTIVE SEED LOCALIZATION;  Surgeon: Erroll Luna, MD;  Location: [place];  Service: General;  Laterality: Left;  . BREAST REDUCTION SURGERY Bilateral 10/23/2017   Procedure: LEFT BREAST ONCOPLASTIC REDUCTION, RIGHT BREAST REDUCTION;  Surgeon: Irene Limbo, MD;  Location: [place];  Service: Plastics;  Laterality: Bilateral;  . CESAREAN SECTION  2011  . GASTRIC BYPASS  2001  . KNEE ARTHROSCOPY W/ MENISCECTOMY Right 2007  . KNEE ARTHROSCOPY WITH MEDIAL MENISECTOMY Left 08/30/2017   Procedure: KNEE ARTHROSCOPY WITH PARTIAL MEDIAL MENISECTOMY;  Surgeon: Nicholes Stairs, MD;  Location: [facility];  Service: Orthopedics;  Laterality: Left;  60 mins  . LAPAROSCOPIC CHOLECYSTECTOMY  2001  . SHOULDER ARTHROSCOPY Left 2008   spur     OB History    Gravida  13   Para      Term      Preterm      AB  13   Living  3     SAB  13   TAB      Ectopic      Multiple      Live Births  Home Medications    Prior to Admission medications   Medication Sig Start Date End Date Taking? Authorizing Provider  albuterol (PROAIR HFA) 108 (90 Base) MCG/ACT inhaler Inhale into the lungs every 6 (six) hours as needed for wheezing or shortness of breath.    [provider]  Cholecalciferol (EQL VITAMIN D3 GUMMIES PO) Take 1 tablet by mouth daily.     [provider]  enoxaparin (LOVENOX) 40 MG/0.4ML injection Inject 0.4 mLs (40 mg total) into the skin daily. Start 3.14.19 10/24/17   Irene Limbo, MD  metaxalone (SKELAXIN) 800 MG tablet Take 800 mg by mouth 3 (three) times daily as needed for muscle spasms.    [provider]  mirtazapine (REMERON) 7.5 MG tablet Take 7.5 mg by mouth at bedtime as needed (sleep).     [provider]  ondansetron (ZOFRAN ODT) 4 MG disintegrating tablet Take 1 tablet (4 mg total) by mouth every 8 (eight) hours as needed for nausea or vomiting. 08/30/17   Nicholes Stairs, MD  oxyCODONE (OXY IR/ROXICODONE) 5 MG immediate release tablet Take 1-2 tablets (5-10 mg total) by mouth every 4 (four) hours as needed for moderate pain. 10/24/17   Irene Limbo, MD    Family History Family History  Problem Relation Age of Onset  . Cancer Father        oral    Social History Social History   Tobacco Use  . Smoking status: Former Smoker    Years: 6.00    Types: Cigarettes    Last attempt to quit: 06/23/2017    Years since quitting: 0.5  . Smokeless tobacco: Never Used  . Tobacco comment: 2ppwk  Substance Use Topics  . Alcohol use: Yes    Comment: occasional  . Drug use: No     Allergies   Patient has no known allergies.   Review of Systems Review of Systems  Constitutional: Negative for fever.  Musculoskeletal:       Left knee pain and swelling   Physical Exam Updated Vital Signs BP (!) 126/94 (BP Location: Right Arm)   Pulse 69   Temp 98 F (36.7 C) (Oral)   Resp 18   Ht 5\' 4"  (1.626 m)   Wt 99.8 kg (220 lb)   LMP 01/08/2018   SpO2 100%   BMI 37.76 kg/m   Physical Exam  Constitutional: She appears well-developed and well-nourished. No distress.  HENT:  Head: Normocephalic and atraumatic.  Eyes: Conjunctivae are normal.  Neck: Neck supple.  Cardiovascular: Normal rate.  Pulmonary/Chest: Effort normal.  Musculoskeletal:  Left knee swelling to superior aspect of left knee. TTP diffusely to the knee with most severe tenderness alone the medial and lateral joint lines. Sensation intact distally. Strength intact and symmetric. No edema, erythema, or warmth to the calves. No warmth or erythema to the left knee. Flexion intact to 70 degrees.   Neurological: She is alert.  Skin: Skin is warm and dry.  Psychiatric: She has a normal mood and affect.    Nursing note and vitals reviewed.    ED Treatments / Results  Labs (all labs ordered are listed, but only abnormal results are displayed) Labs Reviewed - No data to display  EKG None  Radiology No results found.  Procedures Procedures (including critical care time) SPLINT APPLICATION Date/Time: 4:12 PM Authorized by: Rodney Booze Consent: Verbal consent obtained. Risks and benefits: risks, benefits and alternatives were discussed Consent given by: patient Splint applied by: orthopedic technician Location  details: LLE Splint type: knee immobilizer Post-procedure: The splinted body part was neurovascularly unchanged following the procedure. Patient tolerance: Patient tolerated the procedure well with no immediate complications.   Medications Ordered in ED Medications - No data to display   Initial Impression / Assessment and Plan / ED Course  I have reviewed the triage vital signs and the nursing notes.  Pertinent labs & imaging results that were available during my care of the patient were reviewed by me and considered in my medical decision making (see chart for details).   Offered patient xray of the knee and she declines.   Final Clinical Impressions(s) / ED Diagnoses   Final diagnoses:  Chronic pain of left knee   Patient presenting with chronic knee pain to the left knee.  Reports swelling of the knee as well.  Her symptoms have been unchanged for the last 5 months.  Her vital signs are normal today.  She has not followed up with her orthopedic doctor but does have an appointment next month.  Symptoms appear chronic in nature.  There is no warmth or erythema to the knee to suggest septic arthritis.  Patient has had no fevers.  She does have some swelling to the superior aspect of the knee but does not show a significant effusion.  She has tenderness along the medial and lateral joint lines.  No calf tenderness erythema or edema to suggest DVT.  Advised to  refill her prescription of her pain medications that she received from her orthopedic doctor.  Also advised to take Tylenol use cold compresses and wear knee immobilizer and use crutches as needed.  Patient requesting a work note.  Will give her a work note for several days.  Advised her to follow-up with her orthopedic doctor.  Gave her information for our on-call orthopedic doctor should she need to get an earlier appointment.  Strict return precautions discussed.  All questions were answered and patient understands plan reasons to return immediately to the ED.  ED Discharge Orders    None       Rodney Booze, PA-C 01/23/18 [address] [place] Minus S, PA-C 01/23/18 1326    Tanna Furry, MD 01/24/18 [phone]

## 2018-04-30 ENCOUNTER — Other Ambulatory Visit: Payer: Self-pay

## 2018-04-30 ENCOUNTER — Encounter (HOSPITAL_COMMUNITY): Payer: Self-pay | Admitting: Emergency Medicine

## 2018-04-30 ENCOUNTER — Emergency Department (HOSPITAL_COMMUNITY)
Admission: EM | Admit: 2018-04-30 | Discharge: 2018-04-30 | Disposition: A | Discharge status: Home / Self Care | Payer: Self-pay | Attending: Emergency Medicine | Admitting: Emergency Medicine

## 2018-04-30 DIAGNOSIS — Z9884 Bariatric surgery status: Secondary | ICD-10-CM | POA: Insufficient documentation

## 2018-04-30 DIAGNOSIS — H9201 Otalgia, right ear: Secondary | ICD-10-CM | POA: Insufficient documentation

## 2018-04-30 DIAGNOSIS — Z87891 Personal history of nicotine dependence: Secondary | ICD-10-CM | POA: Insufficient documentation

## 2018-04-30 DIAGNOSIS — J45909 Unspecified asthma, uncomplicated: Secondary | ICD-10-CM | POA: Insufficient documentation

## 2018-04-30 DIAGNOSIS — R51 Headache: Secondary | ICD-10-CM | POA: Insufficient documentation

## 2018-04-30 DIAGNOSIS — R519 Headache, unspecified: Secondary | ICD-10-CM

## 2018-04-30 DIAGNOSIS — R112 Nausea with vomiting, unspecified: Secondary | ICD-10-CM | POA: Insufficient documentation

## 2018-04-30 DIAGNOSIS — Z79899 Other long term (current) drug therapy: Secondary | ICD-10-CM | POA: Insufficient documentation

## 2018-04-30 DIAGNOSIS — D508 Other iron deficiency anemias: Secondary | ICD-10-CM | POA: Insufficient documentation

## 2018-04-30 LAB — CBC WITH DIFFERENTIAL/PLATELET
ABS IMMATURE GRANULOCYTES: 0 10*3/uL (ref 0.0–0.1)
BASOS ABS: 0 10*3/uL (ref 0.0–0.1)
Basophils Relative: 0 %
Eosinophils Absolute: 0.1 10*3/uL (ref 0.0–0.7)
Eosinophils Relative: 2 %
HEMATOCRIT: 29.7 % — AB (ref 36.0–46.0)
HEMOGLOBIN: 9.4 g/dL — AB (ref 12.0–15.0)
Immature Granulocytes: 0 %
LYMPHS ABS: 1.6 10*3/uL (ref 0.7–4.0)
LYMPHS PCT: 33 %
MCH: 25.2 pg — AB (ref 26.0–34.0)
MCHC: 31.6 g/dL (ref 30.0–36.0)
MCV: 79.6 fL (ref 78.0–100.0)
MONOS PCT: 7 %
Monocytes Absolute: 0.3 10*3/uL (ref 0.1–1.0)
NEUTROS ABS: 2.9 10*3/uL (ref 1.7–7.7)
Neutrophils Relative %: 58 %
Platelets: 246 10*3/uL (ref 150–400)
RBC: 3.73 MIL/uL — ABNORMAL LOW (ref 3.87–5.11)
RDW: 16.1 % — ABNORMAL HIGH (ref 11.5–15.5)
WBC: 5 10*3/uL (ref 4.0–10.5)

## 2018-04-30 LAB — COMPREHENSIVE METABOLIC PANEL
ALBUMIN: 1.9 g/dL — AB (ref 3.5–5.0)
ALBUMIN: 3.1 g/dL — AB (ref 3.5–5.0)
ALK PHOS: 101 U/L (ref 38–126)
ALT: 8 U/L (ref 0–44)
ALT: 12 U/L (ref 0–44)
ANION GAP: 4 — AB (ref 5–15)
ANION GAP: 7 (ref 5–15)
AST: 10 U/L — ABNORMAL LOW (ref 15–41)
AST: 22 U/L (ref 15–41)
Alkaline Phosphatase: 58 U/L (ref 38–126)
BILIRUBIN TOTAL: 0.7 mg/dL (ref 0.3–1.2)
BUN: 7 mg/dL (ref 6–20)
BUN: 7 mg/dL (ref 6–20)
CALCIUM: 8.8 mg/dL — AB (ref 8.9–10.3)
CHLORIDE: 128 mmol/L — AB (ref 98–111)
CO2: 14 mmol/L — AB (ref 22–32)
CO2: 21 mmol/L — AB (ref 22–32)
CREATININE: 0.94 mg/dL (ref 0.44–1.00)
Calcium: 5.1 mg/dL — CL (ref 8.9–10.3)
Chloride: 114 mmol/L — ABNORMAL HIGH (ref 98–111)
Creatinine, Ser: 0.55 mg/dL (ref 0.44–1.00)
GFR calc Af Amer: >60 mL/min (ref >60)
GFR calc Af Amer: >60 mL/min (ref >60)
GFR calc non Af Amer: >60 mL/min (ref >60)
GFR calc non Af Amer: >60 mL/min (ref >60)
GLUCOSE: 64 mg/dL — AB (ref 70–99)
GLUCOSE: 85 mg/dL (ref 70–99)
POTASSIUM: 2.3 mmol/L — AB (ref 3.5–5.1)
Potassium: 4.5 mmol/L (ref 3.5–5.1)
SODIUM: 146 mmol/L — AB (ref 135–145)
SODIUM: 142 mmol/L (ref 135–145)
Total Bilirubin: 0.2 mg/dL — ABNORMAL LOW (ref 0.3–1.2)
Total Protein: 3.6 g/dL — ABNORMAL LOW (ref 6.5–8.1)
Total Protein: 6.1 g/dL — ABNORMAL LOW (ref 6.5–8.1)

## 2018-04-30 LAB — I-STAT VENOUS BLOOD GAS, ED
ACID-BASE DEFICIT: 16 mmol/L — AB (ref 0.0–2.0)
Bicarbonate: 9.7 mmol/L — ABNORMAL LOW (ref 20.0–28.0)
O2 SAT: 87 %
PO2 VEN: 59 mmHg — AB (ref 32.0–45.0)
TCO2: 10 mmol/L — ABNORMAL LOW (ref 22–32)
pCO2, Ven: 20.9 mmHg — ABNORMAL LOW (ref 44.0–60.0)
pH, Ven: 7.273 (ref 7.250–7.430)

## 2018-04-30 LAB — CBC
HCT: 17.1 % — ABNORMAL LOW (ref 36.0–46.0)
HEMOGLOBIN: 5.1 g/dL — AB (ref 12.0–15.0)
MCH: 24.6 pg — ABNORMAL LOW (ref 26.0–34.0)
MCHC: 29.8 g/dL — AB (ref 30.0–36.0)
MCV: 82.6 fL (ref 78.0–100.0)
Platelets: 130 10*3/uL — ABNORMAL LOW (ref 150–400)
RBC: 2.07 MIL/uL — ABNORMAL LOW (ref 3.87–5.11)
RDW: 16.3 % — AB (ref 11.5–15.5)
WBC: 2.8 10*3/uL — ABNORMAL LOW (ref 4.0–10.5)

## 2018-04-30 MED ORDER — SODIUM CHLORIDE 0.9 % IV SOLN: 10.0000 mL/h | Freq: Once | INTRAVENOUS | Status: DC

## 2018-04-30 MED ORDER — KCL-LACTATED RINGERS 20 MEQ/L IV SOLN
INTRAVENOUS | Status: DC
  Administered 2018-04-30: 14:00:00 via INTRAVENOUS
  Filled 2018-04-30 (×2): qty 1000

## 2018-04-30 MED ORDER — KETOROLAC TROMETHAMINE 15 MG/ML IJ SOLN
15.0000 mg | Freq: Once | INTRAMUSCULAR | Status: AC
  Administered 2018-04-30: 15 mg via INTRAVENOUS
  Filled 2018-04-30: qty 1

## 2018-04-30 MED ORDER — NEOMYCIN-POLYMYXIN-HC 3.5-10000-1 OT SUSP: 3.0000 [drp] | Freq: Three times a day (TID) | OTIC | 0 refills | Status: AC

## 2018-04-30 MED ORDER — PROCHLORPERAZINE EDISYLATE 10 MG/2ML IJ SOLN
10.0000 mg | Freq: Once | INTRAMUSCULAR | Status: AC
  Administered 2018-04-30: 10 mg via INTRAVENOUS
  Filled 2018-04-30: qty 2

## 2018-04-30 MED ORDER — POTASSIUM CHLORIDE CRYS ER 20 MEQ PO TBCR
40.0000 meq | EXTENDED_RELEASE_TABLET | Freq: Once | ORAL | Status: AC
  Administered 2018-04-30: 40 meq via ORAL
  Filled 2018-04-30: qty 2

## 2018-04-30 MED ORDER — LACTATED RINGERS IV SOLN: INTRAVENOUS | Status: DC

## 2018-04-30 MED ORDER — SODIUM CHLORIDE 0.9 % IV SOLN
1.0000 g | Freq: Once | INTRAVENOUS | Status: AC
  Administered 2018-04-30: 1 g via INTRAVENOUS
  Filled 2018-04-30: qty 10

## 2018-04-30 MED ORDER — POTASSIUM CHLORIDE 10 MEQ/100ML IV SOLN
10.0000 meq | Freq: Once | INTRAVENOUS | Status: AC
  Administered 2018-04-30: 10 meq via INTRAVENOUS
  Filled 2018-04-30: qty 100

## 2018-04-30 MED ORDER — SODIUM CHLORIDE 0.9 % IV BOLUS
1000.0000 mL | Freq: Once | INTRAVENOUS | Status: AC
  Administered 2018-04-30: 1000 mL via INTRAVENOUS

## 2018-04-30 MED ORDER — DIPHENHYDRAMINE HCL 50 MG/ML IJ SOLN
25.0000 mg | Freq: Once | INTRAMUSCULAR | Status: AC
  Administered 2018-04-30: 25 mg via INTRAVENOUS
  Filled 2018-04-30: qty 1

## 2018-04-30 NOTE — ED Triage Notes (Signed)
Pt reports she has had a constant migraine for the last 2 weeks. Pt reports she has only taken some Vicodin for her pain that she had from the dentist. Pt states this has not relieved her pain.

## 2018-04-30 NOTE — ED Notes (Signed)
IV team at bedside 

## 2018-04-30 NOTE — ED Notes (Signed)
ED Provider at bedside. 

## 2018-04-30 NOTE — ED Provider Notes (Signed)
[facility] EMERGENCY DEPARTMENT Provider Note   CSN: [id number] Arrival date & time: 04/30/18  6734     History   Chief Complaint Chief Complaint  Patient presents with  . Migraine    HPI Yesenia Wheeler is a 47 y.o. female.  47 y/o female with a PMH of migraines presents to the ED with a chief complaint of headache x 2 weeks. Patient reports the pain is sharp,stabbing constant in the center of her head.  No radiation.  She also reports some photophobia, phonophobia along with nausea and vomiting. Patient also reports some right ear pain which began sometime this week. She states the nausea and vomiting did not begin until this morning.  Has tried ibuprofen 800, Vicodin but states no relief in symptoms.  Currently smokes 2-3 black in miles a day.  Reports she was seen by her neurologist over a year ago and was told that she needed to have Botox injections for her migraines, patient does not agree with this management and states she does not want to proceed with neurologist care.  Currently takes no medication for her migraines and is not on any prophylactic medication for migraines. She denies any chest pain, shortness of breath, dizziness.  The history is provided by the patient.  Headache   This is a recurrent problem. The current episode started more than 1 week ago. The problem occurs constantly. The problem has not changed since onset.The headache is associated with nothing. The pain is located in the occipital region. The quality of the pain is described as sharp. The pain is at a severity of 9/10. The pain is severe. The pain does not radiate. Associated symptoms include nausea and vomiting. Pertinent negatives include no fever, no palpitations and no shortness of breath. She has tried acetaminophen for the symptoms.    Past Medical History:  Diagnosis Date  . Acute meniscal tear of left knee   . Anemia   . 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 Migraines   . Mild asthma   . OA (osteoarthritis)    bilateral knees    Patient Active Problem List   Diagnosis Date Noted  . Left breast mass 10/23/2017  . Acute medial meniscus tear of left knee 08/30/2017  . BV (bacterial vaginosis) 06/23/2015  . Tinea pedis 06/21/2015  . Screening-pulmonary TB 03/15/2015  . Obesity (BMI 30-39.9) 05/26/2014  . Asthma, chronic 05/25/2014  . Migraine 05/25/2014  . Chronic back pain 05/25/2014    Past Surgical History:  Procedure Laterality Date  . BREAST LUMPECTOMY WITH RADIOACTIVE SEED LOCALIZATION Left 10/23/2017   Procedure: BREAST LUMPECTOMY WITH RADIOACTIVE SEED LOCALIZATION;  Surgeon: Erroll Luna, MD;  Location: [place];  Service: General;  Laterality: Left;  . BREAST REDUCTION SURGERY Bilateral 10/23/2017   Procedure: LEFT BREAST ONCOPLASTIC REDUCTION, RIGHT BREAST REDUCTION;  Surgeon: Irene Limbo, MD;  Location: [place];  Service: Plastics;  Laterality: Bilateral;  . CESAREAN SECTION  2011  . GASTRIC BYPASS  2001  . KNEE ARTHROSCOPY W/ MENISCECTOMY Right 2007  . KNEE ARTHROSCOPY WITH MEDIAL MENISECTOMY Left 08/30/2017   Procedure: KNEE ARTHROSCOPY WITH PARTIAL MEDIAL MENISECTOMY;  Surgeon: Nicholes Stairs, MD;  Location: [facility];  Service:  Orthopedics;  Laterality: Left;  60 mins  . LAPAROSCOPIC CHOLECYSTECTOMY  2001  . SHOULDER ARTHROSCOPY Left 2008   spur     OB History    Gravida  13   Para      Term      Preterm      AB  13   Living  3     SAB  13   TAB      Ectopic      Multiple      Live Births               Home Medications    Prior to Admission medications   Medication Sig Start Date End Date Taking? Authorizing Provider  albuterol  (PROAIR HFA) 108 (90 Base) MCG/ACT inhaler Inhale into the lungs every 6 (six) hours as needed for wheezing or shortness of breath.   Yes [provider]  amoxicillin (AMOXIL) 500 MG capsule Take 500 mg by mouth 3 (three) times daily.   Yes [provider]  HYDROcodone-acetaminophen (NORCO/VICODIN) 5-325 MG tablet Take 1 tablet by mouth as needed.   Yes [provider]  ibuprofen (ADVIL,MOTRIN) 800 MG tablet Take 800 mg by mouth every 6 (six) hours as needed. 10/09/16  Yes [provider]  enoxaparin (LOVENOX) 40 MG/0.4ML injection Inject 0.4 mLs (40 mg total) into the skin daily. Start 3.14.19 Patient not taking: Reported on 04/30/2018 10/24/17   Irene Limbo, MD  neomycin-polymyxin-hydrocortisone (CORTISPORIN) 3.5-10000-1 OTIC suspension Place 3 drops into the right ear 3 (three) times daily for 7 days. 04/30/18 05/07/18  Janeece Fitting, PA-C  ondansetron (ZOFRAN ODT) 4 MG disintegrating tablet Take 1 tablet (4 mg total) by mouth every 8 (eight) hours as needed for nausea or vomiting. Patient not taking: Reported on 04/30/2018 08/30/17   Nicholes Stairs, MD  oxyCODONE (OXY IR/ROXICODONE) 5 MG immediate release tablet Take 1-2 tablets (5-10 mg total) by mouth every 4 (four) hours as needed for moderate pain. Patient not taking: Reported on 04/30/2018 10/24/17   Irene Limbo, MD    Family History Family History  Problem Relation Age of Onset  . Cancer Father        oral    Social History Social History   Tobacco Use  . Smoking status: Former Smoker    Years: 6.00    Types: Cigarettes    Last attempt to quit: 06/23/2017    Years since quitting: 0.8  . Smokeless tobacco: Never Used  . Tobacco comment: 2ppwk  Substance Use Topics  . Alcohol use: Yes    Comment: occasional  . Drug use: No     Allergies   Oxycodone   Review of Systems Review of Systems  Constitutional: Negative for chills and fever.  HENT: Negative for ear pain and sore  throat.   Eyes: Positive for photophobia. Negative for pain and visual disturbance.  Respiratory: Negative for cough and shortness of breath.   Cardiovascular: Negative for chest pain and palpitations.  Gastrointestinal: Positive for nausea and vomiting. Negative for abdominal pain.  Genitourinary: Negative for dysuria and hematuria.  Musculoskeletal: Negative for arthralgias and back pain.  Skin: Negative for color change and rash.  Neurological: Positive for headaches. Negative for seizures and syncope.  All other systems reviewed and are negative.    Physical Exam Updated Vital Signs BP 99/72   Pulse (!) 57   Temp 98.3 F (36.8 C) (Oral)   Resp (!) 22   Ht 5\' 4"  (1.626 m)  Wt 99.8 kg   LMP 04/29/2018   SpO2 100%   BMI 37.76 kg/m   Physical Exam  Constitutional: She is oriented to person, place, and time. She appears well-developed and well-nourished.  Teary eyed on exam   HENT:  Head: Normocephalic.  Right Ear: Hearing and external ear normal. There is tenderness. No mastoid tenderness. Tympanic membrane is not injected, not scarred, not perforated and not erythematous. Tympanic membrane mobility is normal.  Left Ear: Hearing, tympanic membrane and external ear normal.  Right ear tenderness with palpation of tragus, patient ear canal looks erythematous, no signs of TM perforation, injection or retraction.   Eyes:  conjunctiva looks pale   Neck: Normal range of motion. Neck supple.  Cardiovascular: Normal heart sounds.  Pulmonary/Chest: Effort normal and breath sounds normal. She has no wheezes.  Abdominal: Soft. Bowel sounds are normal. There is no tenderness.  Neurological: She is alert and oriented to person, place, and time.  Speech is fluent without obvious dysarthria or dysphasia. Strength 5/5 with hand grip and ankle F/E.   Sensation to light touch intact in hands and feet. Normal gait. No pronator drift. No leg drop.  Normal finger-to-nose and finger tapping.   CN I, II and VIII not tested. CN II-XII grossly intact bilaterally.    Skin: Skin is warm and dry. There is pallor.  Nursing note and vitals reviewed.    ED Treatments / Results  Labs (all labs ordered are listed, but only abnormal results are displayed) Labs Reviewed  CBC - Abnormal; Notable for the following components:      Result Value   WBC 2.8 (*)    RBC 2.07 (*)    Hemoglobin 5.1 (*)    HCT 17.1 (*)    MCH 24.6 (*)    MCHC 29.8 (*)    RDW 16.3 (*)    Platelets 130 (*)    All other components within normal limits  COMPREHENSIVE METABOLIC PANEL - Abnormal; Notable for the following components:   Sodium 146 (*)    Potassium 2.3 (*)    Chloride 128 (*)    CO2 14 (*)    Glucose, Bld 64 (*)    Calcium 5.1 (*)    Total Protein 3.6 (*)    Albumin 1.9 (*)    AST 10 (*)    Total Bilirubin 0.2 (*)    Anion gap 4 (*)    All other components within normal limits  CBC WITH DIFFERENTIAL/PLATELET - Abnormal; Notable for the following components:   RBC 3.73 (*)    Hemoglobin 9.4 (*)    HCT 29.7 (*)    MCH 25.2 (*)    RDW 16.1 (*)    All other components within normal limits  COMPREHENSIVE METABOLIC PANEL - Abnormal; Notable for the following components:   Chloride 114 (*)    CO2 21 (*)    Calcium 8.8 (*)    Total Protein 6.1 (*)    Albumin 3.1 (*)    All other components within normal limits  I-STAT VENOUS BLOOD GAS, ED - Abnormal; Notable for the following components:   pCO2, Ven 20.9 (*)    pO2, Ven 59.0 (*)    Bicarbonate 9.7 (*)    TCO2 10 (*)    Acid-base deficit 16.0 (*)    All other components within normal limits  URINE CULTURE  BLOOD GAS, VENOUS    EKG EKG Interpretation  Date/Time:  Tuesday April 30 2018 11:50:20 EDT Ventricular Rate:  54  PR Interval:    QRS Duration: 95 QT Interval:  424 QTC Calculation: 402 R Axis:   44 Text Interpretation:  Sinus rhythm Baseline wander in lead(s) V5 no ischemic changes. no sig change from previous  Confirmed by Charlesetta Shanks [phone]) on 04/30/2018 11:53:27 AM Also confirmed by Charlesetta Shanks [phone]), editor Philomena Doheny [phone])  on 04/30/2018 12:55:32 PM   Radiology No results found.  Procedures Procedures (including critical care time)  Medications Ordered in ED Medications  lactated ringers with KCl 20 mEq/L infusion ( Intravenous Stopped 04/30/18 1508)  ketorolac (TORADOL) 15 MG/ML injection 15 mg (15 mg Intravenous Given 04/30/18 0841)  diphenhydrAMINE (BENADRYL) injection 25 mg (25 mg Intravenous Given 04/30/18 0841)  prochlorperazine (COMPAZINE) injection 10 mg (10 mg Intravenous Given 04/30/18 0840)  sodium chloride 0.9 % bolus 1,000 mL (0 mLs Intravenous Stopped 04/30/18 1103)  potassium chloride SA (K-DUR,KLOR-CON) CR tablet 40 mEq (40 mEq Oral Given 04/30/18 1155)  potassium chloride 10 mEq in 100 mL IVPB (0 mEq Intravenous Stopped 04/30/18 1329)  calcium gluconate 1 g in sodium chloride 0.9 % 100 mL IVPB (0 g Intravenous Stopped 04/30/18 1356)     Initial Impression / Assessment and Plan / ED Course  I have reviewed the triage vital signs and the nursing notes.  Pertinent labs & imaging results that were available during my care of the patient were reviewed by me and considered in my medical decision making (see chart for details).     She with a previous history of migraines with no prophylactic treatment presents to the ED with a migraine x2 weeks.  Upon arrival patient is teary-eyed with photophobia and phonophobia.  At this time I will give patient headache cocktail along with a bolus of fluids for rehydration.  Will order some basic lab work in order to rule out any further infection, I will assess patient after headache cocktail has been given.  9:38 AM currently laying in bed in a dark room she states that she feels the head pain has improved but states the light still causing her discomfort.  Currently receiving a liter of fluids and is halfway through it.  11:00 AM  states her pain has completely resolved at this point after the headache cocktail, she has received a whole bolus of fluids.  12:01 PMLaboratory results showed hemoglobin of 5 and K+ 2.3 patient currently in bed resting. I have discussed this patient with Dr. Johnney Killian, she is to see patient. Will order K+ replacement for patient along with blood products. Call placed to hospitalist for admission.   3:03 PM Redraw ordered, Hgb IS NOW 9.5 which is patients baseline from 10.5 six months ago. Patient K+ has been replaced and is now 4.5.Patient is asymptomatic I will discharge her home with antibiotics for her ear infection along with a referral to [facility] to establish care.    Final Clinical Impressions(s) / ED Diagnoses   Final diagnoses:  Bad headache  Other iron deficiency anemia    ED Discharge Orders         Ordered    neomycin-polymyxin-hydrocortisone (CORTISPORIN) 3.5-10000-1 OTIC suspension  3 times daily     04/30/18 1509           Janeece Fitting, PA-C 04/30/18 1512    Charlesetta Shanks, MD 05/02/18 [phone]

## 2018-04-30 NOTE — Discharge Instructions (Signed)
I have prescribed antibiotics for your right ear infection.Please apply 3-4 drops to the right ear up to 4 times a day for a total of 7 days. I have provided a referral to the [facility] to establish care for further management of anemia. If your symptoms worsen or you experience any chest pain, shortness of breath you may return to the ED for reevaluation.

## 2018-04-30 NOTE — ED Notes (Signed)
Critical lab results read verbally to [place], [place]

## 2018-05-01 LAB — URINE CULTURE

## 2018-05-07 ENCOUNTER — Encounter (HOSPITAL_COMMUNITY): Payer: Self-pay

## 2018-05-07 ENCOUNTER — Emergency Department (HOSPITAL_COMMUNITY)
Admission: EM | Admit: 2018-05-07 | Discharge: 2018-05-07 | Disposition: A | Discharge status: Home / Self Care | Payer: PRIVATE HEALTH INSURANCE | Attending: Emergency Medicine | Admitting: Emergency Medicine

## 2018-05-07 ENCOUNTER — Other Ambulatory Visit: Payer: Self-pay

## 2018-05-07 DIAGNOSIS — G43809 Other migraine, not intractable, without status migrainosus: Secondary | ICD-10-CM | POA: Diagnosis not present

## 2018-05-07 DIAGNOSIS — Z86718 Personal history of other venous thrombosis and embolism: Secondary | ICD-10-CM | POA: Diagnosis not present

## 2018-05-07 DIAGNOSIS — Z87891 Personal history of nicotine dependence: Secondary | ICD-10-CM | POA: Diagnosis not present

## 2018-05-07 DIAGNOSIS — R51 Headache: Secondary | ICD-10-CM | POA: Diagnosis present

## 2018-05-07 MED ORDER — METOCLOPRAMIDE HCL 5 MG/ML IJ SOLN
10.0000 mg | Freq: Once | INTRAMUSCULAR | Status: AC
  Administered 2018-05-07: 10 mg via INTRAVENOUS
  Filled 2018-05-07: qty 2

## 2018-05-07 MED ORDER — DIPHENHYDRAMINE HCL 25 MG PO CAPS
25.0000 mg | ORAL_CAPSULE | Freq: Once | ORAL | Status: AC
  Administered 2018-05-07: 25 mg via ORAL
  Filled 2018-05-07: qty 1

## 2018-05-07 MED ORDER — DEXAMETHASONE SODIUM PHOSPHATE 10 MG/ML IJ SOLN
10.0000 mg | Freq: Once | INTRAMUSCULAR | Status: AC
  Administered 2018-05-07: 10 mg via INTRAVENOUS
  Filled 2018-05-07: qty 1

## 2018-05-07 MED ORDER — KETOROLAC TROMETHAMINE 15 MG/ML IJ SOLN
15.0000 mg | Freq: Once | INTRAMUSCULAR | Status: AC
  Administered 2018-05-07: 15 mg via INTRAVENOUS
  Filled 2018-05-07: qty 1

## 2018-05-07 NOTE — Discharge Instructions (Signed)
We saw you in the ER for headaches. All the labs and imaging are normal. We are not sure what is causing your headaches, however, there appears to be no evidence of infection, bleeds or tumors based on our exam and results.  Please take motrin round the clock for the next 6 hours, and take other meds prescribed only for break through pain. See your doctor if the pain persists, as you might need better medications or a specialist.

## 2018-05-07 NOTE — ED Provider Notes (Signed)
Patient feels improved following IV Decadron and p.o. Benadryl.  Had improvement following Reglan and Toradol.  Recommend follow-up with primary care doctor and possible referral to neurology.  Patient continues to have monthly headaches.  Had failed Imitrex in the past.  Is neurologically intact.  May benefit from other preventative headache medications.  Discharged in good condition.  Told to return to the ED if symptoms worsen.   Lennice Sites, DO 05/07/18 1658

## 2018-05-07 NOTE — ED Triage Notes (Signed)
Patient c/o migraine and nausea x 3 weeks.

## 2018-05-10 NOTE — ED Provider Notes (Signed)
[facility] DEPT Provider Note   CSN: [id number] Arrival date & time: 05/07/18  1054     History   Chief Complaint Chief Complaint  Patient presents with  . Migraine    HPI Yesenia Wheeler is a 47 y.o. female.  HPI  47 year old female comes in with chief complaint of headaches. Patient reports that she is been having migraines for the past 3 weeks.  Headaches are located diffusely, over the front and are throbbing in nature with associated nausea.  Patient also has photophobia and phonophobia. She has been diagnosed with migraines, and typically when she gets them the symptoms will last for several days.  Patient used to see a neurologist, however she has not been able to follow-up with them.  She has been taking over-the-counter medications without significant relief.  Patient has history of DVT and migraines.  She denies any substance abuse, trauma, family history of brain tumors, brain aneurysm or brain bleeds.  Review of system is negative for any new neurologic symptoms.  Past Medical History:  Diagnosis Date  . Acute meniscal tear of left knee   . Anemia   . 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 Migraines   . Mild asthma   . OA (osteoarthritis)    bilateral knees    Patient Active Problem List   Diagnosis Date Noted  . Left breast mass 10/23/2017  . Acute medial meniscus tear of left knee 08/30/2017  . BV (bacterial vaginosis) 06/23/2015  . Tinea pedis 06/21/2015  . Screening-pulmonary TB 03/15/2015  . Obesity (BMI 30-39.9) 05/26/2014  . Asthma, chronic 05/25/2014  . Migraine 05/25/2014  . Chronic back pain 05/25/2014    Past Surgical History:  Procedure Laterality Date  . BREAST LUMPECTOMY WITH RADIOACTIVE SEED  LOCALIZATION Left 10/23/2017   Procedure: BREAST LUMPECTOMY WITH RADIOACTIVE SEED LOCALIZATION;  Surgeon: Erroll Luna, MD;  Location: [facility];  Service: General;  Laterality: Left;  . BREAST REDUCTION SURGERY Bilateral 10/23/2017   Procedure: LEFT BREAST ONCOPLASTIC REDUCTION, RIGHT BREAST REDUCTION;  Surgeon: Irene Limbo, MD;  Location: [facility];  Service: Plastics;  Laterality: Bilateral;  . CESAREAN SECTION  2011  . GASTRIC BYPASS  2001  . KNEE ARTHROSCOPY W/ MENISCECTOMY Right 2007  . KNEE ARTHROSCOPY WITH MEDIAL MENISECTOMY Left 08/30/2017   Procedure: KNEE ARTHROSCOPY WITH PARTIAL MEDIAL MENISECTOMY;  Surgeon: Nicholes Stairs, MD;  Location: [facility];  Service: Orthopedics;  Laterality: Left;  60 mins  . LAPAROSCOPIC CHOLECYSTECTOMY  2001  . SHOULDER ARTHROSCOPY Left 2008   spur     OB History    Gravida  13   Para      Term      Preterm      AB  13   Living  3     SAB  13   TAB      Ectopic      Multiple      Live Births               Home Medications    Prior to Admission medications   Medication Sig Start Date End Date Taking? Authorizing Provider  albuterol (PROAIR HFA) 108 (90 Base) MCG/ACT inhaler Inhale into the  lungs every 6 (six) hours as needed for wheezing or shortness of breath.   Yes [provider]  enoxaparin (LOVENOX) 40 MG/0.4ML injection Inject 0.4 mLs (40 mg total) into the skin daily. Start 3.14.19 Patient not taking: Reported on 04/30/2018 10/24/17   Irene Limbo, MD  ondansetron (ZOFRAN ODT) 4 MG disintegrating tablet Take 1 tablet (4 mg total) by mouth every 8 (eight) hours as needed for nausea or vomiting. Patient not taking: Reported on 04/30/2018 08/30/17   Nicholes Stairs, MD  oxyCODONE (OXY IR/ROXICODONE) 5 MG immediate release tablet Take 1-2 tablets (5-10 mg total) by mouth every 4 (four) hours as needed for moderate pain. Patient not taking:  Reported on 04/30/2018 10/24/17   Irene Limbo, MD    Family History Family History  Problem Relation Age of Onset  . Cancer Father        oral    Social History Social History   Tobacco Use  . Smoking status: Former Smoker    Years: 6.00    Types: Cigarettes    Last attempt to quit: 06/23/2017    Years since quitting: 0.8  . Smokeless tobacco: Never Used  . Tobacco comment: 2ppwk  Substance Use Topics  . Alcohol use: Yes    Comment: occasional  . Drug use: No     Allergies   Hydrocodone and Oxycodone   Review of Systems Review of Systems  All other systems reviewed and are negative.    Physical Exam Updated Vital Signs BP 115/69 (BP Location: Right Arm)   Pulse 78   Temp 98.3 F (36.8 C) (Oral)   Resp 18   Ht 5\' 2"  (1.575 m)   Wt 99.8 kg   LMP 04/29/2018   SpO2 98%   BMI 40.24 kg/m   Physical Exam  Constitutional: She is oriented to person, place, and time. She appears well-developed.  HENT:  Head: Normocephalic and atraumatic.  Eyes: Pupils are equal, round, and reactive to light. EOM are normal.  Neck: Normal range of motion. Neck supple.  Cardiovascular: Normal rate.  Pulmonary/Chest: Effort normal.  Abdominal: Bowel sounds are normal.  Neurological: She is alert and oriented to person, place, and time. No cranial nerve deficit. Coordination normal.  Cerebellar exam is normal (finger to nose) Sensory exam normal for bilateral upper and lower extremities - and patient is able to discriminate between sharp and dull. Motor exam is 4+/5   Skin: Skin is warm and dry.  Nursing note and vitals reviewed.    ED Treatments / Results  Labs (all labs ordered are listed, but only abnormal results are displayed) Labs Reviewed - No data to display  EKG None  Radiology No results found.  Procedures Procedures (including critical care time)  Medications Ordered in ED Medications  ketorolac (TORADOL) 15 MG/ML injection 15 mg (15 mg  Intravenous Given 05/07/18 1328)  metoCLOPramide (REGLAN) injection 10 mg (10 mg Intravenous Given 05/07/18 1328)  metoCLOPramide (REGLAN) injection 10 mg (10 mg Intravenous Given 05/07/18 1625)  ketorolac (TORADOL) 15 MG/ML injection 15 mg (15 mg Intravenous Given 05/07/18 1624)  dexamethasone (DECADRON) injection 10 mg (10 mg Intravenous Given 05/07/18 1625)  diphenhydrAMINE (BENADRYL) capsule 25 mg (25 mg Oral Given 05/07/18 1639)     Initial Impression / Assessment and Plan / ED Course  I have reviewed the triage vital signs and the nursing notes.  Pertinent labs & imaging results that were available during my care of the patient were reviewed by me and considered in  my medical decision making (see chart for details).     47 year old female comes in with chief complaint of headaches.  She has a history of migraines, and reports that the current headaches have been going on for 3 weeks.  Her headaches are typical of her migrainous headache and there is no focal neurologic deficits on exam.  Given that patient has a history of DVT, we considered cerebral venous thrombosis in the differential diagnosis, however she reports repeatedly that the headaches are similar to her migraines headache therefore we will not pursue the diagnosis at this time.  Furthermore, clinically we are not concerned for meningitis or intracranial bleed.  Plan is to see if he can get patient's symptoms and better control.  She will also be advised to follow-up with neurology for optimal care, given that headaches that go on for several weeks and affecting her lifestyle is probably not the best case scenario.  Final Clinical Impressions(s) / ED Diagnoses   Final diagnoses:  Other migraine without status migrainosus, not intractable    ED Discharge Orders    None       Varney Biles, MD 05/10/18 1531

## 2018-06-11 ENCOUNTER — Encounter (HOSPITAL_COMMUNITY): Payer: Self-pay | Admitting: *Deleted

## 2018-06-11 ENCOUNTER — Emergency Department (HOSPITAL_COMMUNITY)
Admission: EM | Admit: 2018-06-11 | Discharge: 2018-06-11 | Disposition: A | Discharge status: Home / Self Care | Payer: PRIVATE HEALTH INSURANCE | Attending: Emergency Medicine | Admitting: Emergency Medicine

## 2018-06-11 ENCOUNTER — Other Ambulatory Visit: Payer: Self-pay

## 2018-06-11 DIAGNOSIS — Z79899 Other long term (current) drug therapy: Secondary | ICD-10-CM | POA: Diagnosis not present

## 2018-06-11 DIAGNOSIS — H9203 Otalgia, bilateral: Secondary | ICD-10-CM | POA: Diagnosis present

## 2018-06-11 DIAGNOSIS — Z87891 Personal history of nicotine dependence: Secondary | ICD-10-CM | POA: Diagnosis not present

## 2018-06-11 DIAGNOSIS — J45909 Unspecified asthma, uncomplicated: Secondary | ICD-10-CM | POA: Diagnosis not present

## 2018-06-11 DIAGNOSIS — H6091 Unspecified otitis externa, right ear: Secondary | ICD-10-CM | POA: Insufficient documentation

## 2018-06-11 DIAGNOSIS — H60311 Diffuse otitis externa, right ear: Secondary | ICD-10-CM

## 2018-06-11 MED ORDER — CIPROFLOXACIN-HYDROCORTISONE 0.2-1 % OT SUSP: 3.0000 [drp] | Freq: Two times a day (BID) | OTIC | 0 refills | Status: AC

## 2018-06-11 NOTE — Discharge Instructions (Signed)
I have provided a prescription for otic drops in order to help with your ear infections.  Please apply 3 drops to both our your ears twice day for the next 7 days.Due to this problem being recurrent I have given you a referral for an ENT physician.

## 2018-06-11 NOTE — ED Provider Notes (Signed)
[facility] EMERGENCY DEPARTMENT Provider Note   CSN: [id number] Arrival date & time: 06/11/18  0945     History   Chief Complaint Chief Complaint  Patient presents with  . Otalgia    HPI Yesenia Wheeler is a 47 y.o. female.  47 y.o female with a PMH of Anemia, Mild asthma presents to the ED with a chief complaint of BL ear pain.  She reports she was seen at an urgent care a month ago and placed on neomycin drops for her ear pain.  She reports since this episode the pain has returned and is now severe.  Scribes her pain as throbbing, sharp in her right ear worse than the left ear.  Has been applying the drops but reports no relieving symptoms.  Denies any URI symptoms, fever, shortness of breath, cough or chest pain.     Past Medical History:  Diagnosis Date  . Acute meniscal tear of left knee   . Anemia   . 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 Migraines   . Mild asthma   . OA (osteoarthritis)    bilateral knees    Patient Active Problem List   Diagnosis Date Noted  . Left breast mass 10/23/2017  . Acute medial meniscus tear of left knee 08/30/2017  . BV (bacterial vaginosis) 06/23/2015  . Tinea pedis 06/21/2015  . Screening-pulmonary TB 03/15/2015  . Obesity (BMI 30-39.9) 05/26/2014  . Asthma, chronic 05/25/2014  . Migraine 05/25/2014  . Chronic back pain 05/25/2014    Past Surgical History:  Procedure Laterality Date  . BREAST LUMPECTOMY WITH RADIOACTIVE SEED LOCALIZATION Left 10/23/2017   Procedure: BREAST LUMPECTOMY WITH RADIOACTIVE SEED LOCALIZATION;  Surgeon: Erroll Luna, MD;  Location: [facility];  Service: General;  Laterality: Left;  . BREAST REDUCTION SURGERY Bilateral 10/23/2017   Procedure: LEFT BREAST  ONCOPLASTIC REDUCTION, RIGHT BREAST REDUCTION;  Surgeon: Irene Limbo, MD;  Location: [facility];  Service: Plastics;  Laterality: Bilateral;  . CESAREAN SECTION  2011  . GASTRIC BYPASS  2001  . KNEE ARTHROSCOPY W/ MENISCECTOMY Right 2007  . KNEE ARTHROSCOPY WITH MEDIAL MENISECTOMY Left 08/30/2017   Procedure: KNEE ARTHROSCOPY WITH PARTIAL MEDIAL MENISECTOMY;  Surgeon: Nicholes Stairs, MD;  Location: [facility];  Service: Orthopedics;  Laterality: Left;  60 mins  . LAPAROSCOPIC CHOLECYSTECTOMY  2001  . SHOULDER ARTHROSCOPY Left 2008   spur     OB History    Gravida  13   Para      Term      Preterm      AB  13   Living  3     SAB  13   TAB      Ectopic      Multiple      Live Births               Home Medications    Prior to Admission medications   Medication Sig Start Date End Date Taking? Authorizing Provider  albuterol (PROAIR HFA) 108 (90 Base) MCG/ACT inhaler Inhale into the lungs every 6 (six) hours as needed for wheezing or shortness of breath.    [provider]  ciprofloxacin-hydrocortisone (CIPRO HC) OTIC suspension Place 3  drops into both ears 2 (two) times daily for 7 days. 06/11/18 06/18/18  Janeece Fitting, PA-C  enoxaparin (LOVENOX) 40 MG/0.4ML injection Inject 0.4 mLs (40 mg total) into the skin daily. Start 3.14.19 Patient not taking: Reported on 04/30/2018 10/24/17   Irene Limbo, MD  ondansetron (ZOFRAN ODT) 4 MG disintegrating tablet Take 1 tablet (4 mg total) by mouth every 8 (eight) hours as needed for nausea or vomiting. Patient not taking: Reported on 04/30/2018 08/30/17   Nicholes Stairs, MD  oxyCODONE (OXY IR/ROXICODONE) 5 MG immediate release tablet Take 1-2 tablets (5-10 mg total) by mouth every 4 (four) hours as needed for moderate pain. Patient not taking: Reported on 04/30/2018 10/24/17   Irene Limbo, MD    Family History Family History  Problem Relation Age of Onset  .  Cancer Father        oral    Social History Social History   Tobacco Use  . Smoking status: Former Smoker    Years: 6.00    Types: Cigarettes    Last attempt to quit: 06/23/2017    Years since quitting: 0.9  . Smokeless tobacco: Never Used  . Tobacco comment: 2ppwk  Substance Use Topics  . Alcohol use: Yes    Comment: occasional  . Drug use: No     Allergies   Hydrocodone and Oxycodone   Review of Systems Review of Systems  Constitutional: Negative for chills and fever.  HENT: Positive for ear discharge and ear pain. Negative for sinus pressure, sinus pain and sore throat.      Physical Exam Updated Vital Signs BP 115/79 (BP Location: Right Arm)   Pulse 70   Temp 98.4 F (36.9 C) (Oral)   Resp 17   Ht 5\' 4"  (1.626 m)   Wt 99.8 kg   SpO2 100%   BMI 37.76 kg/m   Physical Exam  Constitutional: She is oriented to person, place, and time. She appears well-developed and well-nourished.  HENT:  Head: Normocephalic and atraumatic.  Right Ear: There is drainage and tenderness. Tympanic membrane is erythematous. Tympanic membrane is not bulging.  Left Ear: There is drainage and tenderness. Tympanic membrane is erythematous. Tympanic membrane is not bulging.  There is significant amount of white drainage on her right ear, erythematous ear canal unable to visualize the TM but pain with palpation of the tragus. Left ear looks erythematous along with small amount of drainage, TM looks normal no retraction, perforation noted.  Neck: Normal range of motion. Neck supple.  Cardiovascular: Normal heart sounds.  Pulmonary/Chest: Effort normal.  Abdominal: Soft.  Neurological: She is alert and oriented to person, place, and time.  Skin: Skin is warm and dry.  Nursing note and vitals reviewed.    ED Treatments / Results  Labs (all labs ordered are listed, but only abnormal results are displayed) Labs Reviewed - No data to display  EKG None  Radiology No results  found.  Procedures Procedures (including critical care time)  Medications Ordered in ED Medications - No data to display   Initial Impression / Assessment and Plan / ED Course  I have reviewed the triage vital signs and the nursing notes.  Pertinent labs & imaging results that were available during my care of the patient were reviewed by me and considered in my medical decision making (see chart for details).  She presents with recurrent otitis externa she was treated about a month ago with antibiotic drops and states that this improved for a little bit  but has now returned.  Upon examination there is significant drainage to both the ears along with erythematous canal and pain with palpation of the tragus.  I have personally reviewed patient's chart and see her glucose levels have been within normal limits this past times, less likely for malignant otitis externa.  Due to recurrent symptoms will refer patient to ENT in order to further evaluate her ears.  At this time will discharge patient with the Cipro-hydrocortisone drops to help with her symptoms.  Final Clinical Impressions(s) / ED Diagnoses   Final diagnoses:  Acute diffuse otitis externa of right ear    ED Discharge Orders         Ordered    ciprofloxacin-hydrocortisone (CIPRO HC) OTIC suspension  2 times daily     06/11/18 1313           Janeece Fitting, PA-C 06/11/18 1314    Davonna Belling, MD 06/11/18 [phone]

## 2018-06-11 NOTE — ED Triage Notes (Signed)
Pt reports being seen one month ago for headache. Was given ear drops for right ear irritation and reports no relief. Now has pain to bilateral ears and still has headaches.

## 2019-07-01 ENCOUNTER — Other Ambulatory Visit: Payer: Self-pay

## 2019-07-01 ENCOUNTER — Encounter (HOSPITAL_COMMUNITY): Payer: Self-pay

## 2019-07-01 ENCOUNTER — Emergency Department (HOSPITAL_COMMUNITY)
Admission: EM | Admit: 2019-07-01 | Discharge: 2019-07-02 | Disposition: A | Discharge status: Home / Self Care | Payer: BC Managed Care – PPO | Attending: Emergency Medicine | Admitting: Emergency Medicine

## 2019-07-01 DIAGNOSIS — J45909 Unspecified asthma, uncomplicated: Secondary | ICD-10-CM | POA: Insufficient documentation

## 2019-07-01 DIAGNOSIS — R1084 Generalized abdominal pain: Secondary | ICD-10-CM | POA: Diagnosis present

## 2019-07-01 DIAGNOSIS — N946 Dysmenorrhea, unspecified: Secondary | ICD-10-CM | POA: Insufficient documentation

## 2019-07-01 DIAGNOSIS — Z5321 Procedure and treatment not carried out due to patient leaving prior to being seen by health care provider: Secondary | ICD-10-CM | POA: Diagnosis not present

## 2019-07-01 LAB — URINALYSIS, ROUTINE W REFLEX MICROSCOPIC
Bilirubin Urine: NEGATIVE
Glucose, UA: NEGATIVE mg/dL
Ketones, ur: NEGATIVE mg/dL
Leukocytes,Ua: NEGATIVE
Nitrite: NEGATIVE
Protein, ur: NEGATIVE mg/dL
Specific Gravity, Urine: 1.005 (ref 1.005–1.030)
pH: 6 (ref 5.0–8.0)

## 2019-07-01 LAB — CBC
HCT: 31.6 % — ABNORMAL LOW (ref 36.0–46.0)
Hemoglobin: 9.8 g/dL — ABNORMAL LOW (ref 12.0–15.0)
MCH: 24.9 pg — ABNORMAL LOW (ref 26.0–34.0)
MCHC: 31 g/dL (ref 30.0–36.0)
MCV: 80.2 fL (ref 80.0–100.0)
Platelets: 259 10*3/uL (ref 150–400)
RBC: 3.94 MIL/uL (ref 3.87–5.11)
RDW: 16.9 % — ABNORMAL HIGH (ref 11.5–15.5)
WBC: 8 10*3/uL (ref 4.0–10.5)
nRBC: 0 % (ref 0.0–0.2)

## 2019-07-01 MED ORDER — SODIUM CHLORIDE 0.9% FLUSH: 3.0000 mL | Freq: Once | INTRAVENOUS | Status: DC

## 2019-07-01 NOTE — ED Triage Notes (Signed)
Pt reports generalized abd pain and nausea that started tonight. Pt denies diarrhea. Pt seems uncomfortable in triage.

## 2019-07-01 NOTE — ED Notes (Signed)
Patient states she is just going to leave and go to her pcp in am. Advised pt to stay. 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

## 2019-07-02 ENCOUNTER — Emergency Department (HOSPITAL_COMMUNITY): Payer: BC Managed Care – PPO

## 2019-07-02 ENCOUNTER — Encounter (HOSPITAL_COMMUNITY): Payer: Self-pay | Admitting: Emergency Medicine

## 2019-07-02 ENCOUNTER — Emergency Department (HOSPITAL_COMMUNITY)
Admission: EM | Admit: 2019-07-02 | Discharge: 2019-07-02 | Disposition: A | Discharge status: Home / Self Care | Payer: BC Managed Care – PPO | Source: Home / Self Care | Attending: Emergency Medicine | Admitting: Emergency Medicine

## 2019-07-02 ENCOUNTER — Other Ambulatory Visit: Payer: Self-pay

## 2019-07-02 DIAGNOSIS — R1030 Lower abdominal pain, unspecified: Secondary | ICD-10-CM

## 2019-07-02 DIAGNOSIS — J45909 Unspecified asthma, uncomplicated: Secondary | ICD-10-CM | POA: Insufficient documentation

## 2019-07-02 DIAGNOSIS — N946 Dysmenorrhea, unspecified: Secondary | ICD-10-CM | POA: Insufficient documentation

## 2019-07-02 DIAGNOSIS — R102 Pelvic and perineal pain: Secondary | ICD-10-CM

## 2019-07-02 LAB — CBC WITH DIFFERENTIAL/PLATELET
Abs Immature Granulocytes: 0.02 10*3/uL (ref 0.00–0.07)
Basophils Absolute: 0 10*3/uL (ref 0.0–0.1)
Basophils Relative: 1 %
Eosinophils Absolute: 0.4 10*3/uL (ref 0.0–0.5)
Eosinophils Relative: 8 %
HCT: 30.6 % — ABNORMAL LOW (ref 36.0–46.0)
Hemoglobin: 9.5 g/dL — ABNORMAL LOW (ref 12.0–15.0)
Immature Granulocytes: 0 %
Lymphocytes Relative: 41 %
Lymphs Abs: 2.1 10*3/uL (ref 0.7–4.0)
MCH: 24.8 pg — ABNORMAL LOW (ref 26.0–34.0)
MCHC: 31 g/dL (ref 30.0–36.0)
MCV: 79.9 fL — ABNORMAL LOW (ref 80.0–100.0)
Monocytes Absolute: 0.3 10*3/uL (ref 0.1–1.0)
Monocytes Relative: 6 %
Neutro Abs: 2.2 10*3/uL (ref 1.7–7.7)
Neutrophils Relative %: 44 %
Platelets: 252 10*3/uL (ref 150–400)
RBC: 3.83 MIL/uL — ABNORMAL LOW (ref 3.87–5.11)
RDW: 16.9 % — ABNORMAL HIGH (ref 11.5–15.5)
WBC: 5 10*3/uL (ref 4.0–10.5)
nRBC: 0 % (ref 0.0–0.2)

## 2019-07-02 LAB — COMPREHENSIVE METABOLIC PANEL
ALT: 15 U/L (ref 0–44)
ALT: 14 U/L (ref 0–44)
AST: 18 U/L (ref 15–41)
AST: 18 U/L (ref 15–41)
Albumin: 3.4 g/dL — ABNORMAL LOW (ref 3.5–5.0)
Albumin: 3.2 g/dL — ABNORMAL LOW (ref 3.5–5.0)
Alkaline Phosphatase: 116 U/L (ref 38–126)
Alkaline Phosphatase: 107 U/L (ref 38–126)
Anion gap: 8 (ref 5–15)
Anion gap: 9 (ref 5–15)
BUN: 9 mg/dL (ref 6–20)
BUN: 7 mg/dL (ref 6–20)
CO2: 22 mmol/L (ref 22–32)
CO2: 21 mmol/L — ABNORMAL LOW (ref 22–32)
Calcium: 8.4 mg/dL — ABNORMAL LOW (ref 8.9–10.3)
Calcium: 8.4 mg/dL — ABNORMAL LOW (ref 8.9–10.3)
Chloride: 109 mmol/L (ref 98–111)
Chloride: 109 mmol/L (ref 98–111)
Creatinine, Ser: 0.94 mg/dL (ref 0.44–1.00)
Creatinine, Ser: 0.89 mg/dL (ref 0.44–1.00)
GFR calc Af Amer: >60 mL/min (ref >60)
GFR calc Af Amer: >60 mL/min (ref >60)
GFR calc non Af Amer: >60 mL/min (ref >60)
GFR calc non Af Amer: >60 mL/min (ref >60)
Glucose, Bld: 89 mg/dL (ref 70–99)
Glucose, Bld: 85 mg/dL (ref 70–99)
Potassium: 3.5 mmol/L (ref 3.5–5.1)
Potassium: 3.9 mmol/L (ref 3.5–5.1)
Sodium: 139 mmol/L (ref 135–145)
Sodium: 139 mmol/L (ref 135–145)
Total Bilirubin: 0.4 mg/dL (ref 0.3–1.2)
Total Bilirubin: 0.6 mg/dL (ref 0.3–1.2)
Total Protein: 6.2 g/dL — ABNORMAL LOW (ref 6.5–8.1)
Total Protein: 6.1 g/dL — ABNORMAL LOW (ref 6.5–8.1)

## 2019-07-02 LAB — LIPASE, BLOOD
Lipase: 32 U/L (ref 11–51)
Lipase: 25 U/L (ref 11–51)

## 2019-07-02 LAB — I-STAT BETA HCG BLOOD, ED (MC, WL, AP ONLY): I-stat hCG, quantitative: <5 m[IU]/mL (ref <5)

## 2019-07-02 MED ORDER — IBUPROFEN 400 MG PO TABS
600.0000 mg | ORAL_TABLET | Freq: Once | ORAL | Status: AC
  Administered 2019-07-02: 600 mg via ORAL
  Filled 2019-07-02: qty 1

## 2019-07-02 MED ORDER — ACETAMINOPHEN 325 MG PO TABS
650.0000 mg | ORAL_TABLET | Freq: Once | ORAL | Status: AC
  Administered 2019-07-02: 11:00:00 650 mg via ORAL
  Filled 2019-07-02: qty 2

## 2019-07-02 NOTE — ED Notes (Signed)
Patient Alert and oriented to baseline. Stable and ambulatory to baseline. Patient verbalized understanding of the discharge instructions.  Patient belongings were taken by the patient.   

## 2019-07-02 NOTE — ED Triage Notes (Addendum)
States she came to ED last night and LWBS due to wait.  C/o lower abd pressure, tenderness, and nausea since leaving work last night.  Denies vomiting and diarrhea. See labs from yesterday.

## 2019-07-02 NOTE — ED Provider Notes (Signed)
Yesenia Wheeler Provider Note   CSN: [id number] Arrival date & time: 07/02/19  [id number]     History   Chief Complaint Chief Complaint  Patient presents with   Abdominal Pain    HPI Yesenia Wheeler is a 48 y.o. female history of DVT during pregnancy, anemia, migraines, asthma, obesity, chronic back pain, breast mass.  Patient began having her menstrual cycle last night at 9 PM, she reports this was a regular scheduled cycle for her.  She reports that she began having pelvic cramping along with vaginal bleeding and of the cramping has gradually worsened.  She describes a severe cramping sensation nonradiating across her lower abdomen without aggravating or alleviating factors.  She is attempted Tylenol last night without relief.  She presented to the ER last night but left due to long wait time.  Patient reports that her cramping has persisted.  Denies fever/chills, headache, neck pain, chest pain/shortness of breath, upper abdominal pain, nausea/vomiting, diarrhea, dysuria/hematuria, vaginal discharge, concern for STI, fall/injury, blood in the stool, numbness/weakness, tingling or any additional concerns today.  Patient reports her last bowel movement was this morning and was normal.  She has eaten breakfast PTA.     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 , complex sclerosing lesion   Migraines    Mild asthma    OA (osteoarthritis)    bilateral knees    Patient Active Problem List   Diagnosis Date Noted   Left breast mass 10/23/2017   Acute medial meniscus tear of left knee 08/30/2017   BV (bacterial vaginosis) 06/23/2015   Tinea pedis 06/21/2015    Screening-pulmonary TB 03/15/2015   Obesity (BMI 30-39.9) 05/26/2014   Asthma, chronic 05/25/2014   Migraine 05/25/2014   Chronic back pain 05/25/2014    Past Surgical History:  Procedure Laterality Date   BREAST LUMPECTOMY WITH RADIOACTIVE SEED LOCALIZATION Left 10/23/2017   Procedure: BREAST LUMPECTOMY WITH RADIOACTIVE SEED LOCALIZATION;  Surgeon: Erroll Luna, MD;  Location: [facility];  Service: General;  Laterality: Left;   BREAST REDUCTION SURGERY Bilateral 10/23/2017   Procedure: LEFT BREAST ONCOPLASTIC REDUCTION, RIGHT BREAST REDUCTION;  Surgeon: Irene Limbo, MD;  Location: [facility];  Service: Plastics;  Laterality: Bilateral;   CESAREAN SECTION  2011   GASTRIC BYPASS  2001   KNEE ARTHROSCOPY W/ MENISCECTOMY Right 2007   KNEE ARTHROSCOPY WITH MEDIAL MENISECTOMY Left 08/30/2017   Procedure: KNEE ARTHROSCOPY WITH PARTIAL MEDIAL MENISECTOMY;  Surgeon: Nicholes Stairs, MD;  Location: [facility];  Service: Orthopedics;  Laterality: Left;  60 mins   LAPAROSCOPIC CHOLECYSTECTOMY  2001   SHOULDER ARTHROSCOPY Left 2008   spur     OB History    Gravida  13   Para      Term      Preterm      AB  13   Living  3     SAB  13   TAB      Ectopic      Multiple      Live Births  Home Medications    Prior to Admission medications   Medication Sig Start Date End Date Taking? Authorizing Provider  albuterol (PROAIR HFA) 108 (90 Base) MCG/ACT inhaler Inhale into the lungs every 6 (six) hours as needed for wheezing or shortness of breath.    [provider]  enoxaparin (LOVENOX) 40 MG/0.4ML injection Inject 0.4 mLs (40 mg total) into the skin daily. Start 3.14.19 Patient not taking: Reported on 04/30/2018 10/24/17   Irene Limbo, MD  ondansetron (ZOFRAN ODT) 4 MG disintegrating tablet Take 1 tablet (4 mg total) by mouth every 8 (eight) hours as needed for nausea or  vomiting. Patient not taking: Reported on 04/30/2018 08/30/17   Nicholes Stairs, MD  oxyCODONE (OXY IR/ROXICODONE) 5 MG immediate release tablet Take 1-2 tablets (5-10 mg total) by mouth every 4 (four) hours as needed for moderate pain. Patient not taking: Reported on 04/30/2018 10/24/17   Irene Limbo, MD    Family History Family History  Problem Relation Age of Onset   Cancer Father        oral    Social History Social History   Tobacco Use   Smoking status: Former Smoker    Years: 6.00    Types: Cigarettes    Quit date: 06/23/2017    Years since quitting: 2.0   Smokeless tobacco: Never Used   Tobacco comment: 2ppwk  Substance Use Topics   Alcohol use: Yes    Comment: occasional   Drug use: No     Allergies   Hydrocodone and Oxycodone   Review of Systems Review of Systems Ten systems are reviewed and are negative for acute change except as noted in the HPI   Physical Exam Updated Vital Signs BP 122/82    Pulse 61    Temp 98.6 F (37 C) (Oral)    Resp 18    LMP 07/01/2019    SpO2 100%   Physical Exam Constitutional:      General: She is not in acute distress.    Appearance: Normal appearance. She is well-developed. She is not ill-appearing or diaphoretic.  HENT:     Head: Normocephalic and atraumatic.     Right Ear: External ear normal.     Left Ear: External ear normal.     Nose: Nose normal.  Eyes:     General: Vision grossly intact. Gaze aligned appropriately.     Pupils: Pupils are equal, round, and reactive to light.  Neck:     Musculoskeletal: Normal range of motion.     Trachea: Trachea and phonation normal. No tracheal deviation.  Cardiovascular:     Rate and Rhythm: Normal rate and regular rhythm.  Pulmonary:     Effort: Pulmonary effort is normal. No respiratory distress.     Breath sounds: Normal breath sounds.  Abdominal:     General: There is no distension.     Palpations: Abdomen is soft.     Tenderness: There is abdominal  tenderness in the suprapubic area. There is no guarding or rebound.  Genitourinary:    Comments: Pelvic exam deferred by patient Musculoskeletal: Normal range of motion.  Skin:    General: Skin is warm and dry.  Neurological:     Mental Status: She is alert.     GCS: GCS eye subscore is 4. GCS verbal subscore is 5. GCS motor subscore is 6.     Comments: Speech is clear and goal oriented, follows commands Major Cranial nerves without deficit, no facial droop Moves extremities without  ataxia, coordination intact  Psychiatric:        Behavior: Behavior normal.      ED Treatments / Results  Labs (all labs ordered are listed, but only abnormal results are displayed) Labs Reviewed  CBC WITH DIFFERENTIAL/PLATELET - Abnormal; Notable for the following components:      Result Value   RBC 3.83 (*)    Hemoglobin 9.5 (*)    HCT 30.6 (*)    MCV 79.9 (*)    MCH 24.8 (*)    RDW 16.9 (*)    All other components within normal limits  COMPREHENSIVE METABOLIC PANEL - Abnormal; Notable for the following components:   CO2 21 (*)    Calcium 8.4 (*)    Total Protein 6.1 (*)    Albumin 3.2 (*)    All other components within normal limits  LIPASE, BLOOD  URINALYSIS, ROUTINE W REFLEX MICROSCOPIC  I-STAT BETA HCG BLOOD, ED ([facility], [facility], [facility] ONLY)    EKG None  Radiology US Pelvic Complete W Transvaginal And Torsion R/o  Result Date: 07/02/2019 CLINICAL DATA:  Pelvic pain since last night EXAM: TRANSABDOMINAL AND TRANSVAGINAL ULTRASOUND OF PELVIS DOPPLER ULTRASOUND OF OVARIES TECHNIQUE: Both transabdominal and transvaginal ultrasound examinations of the pelvis were performed. Transabdominal technique was performed for global imaging of the pelvis including uterus, ovaries, adnexal regions, and pelvic cul-de-sac. It was necessary to proceed with endovaginal exam following the transabdominal exam to visualize the endometrium and LEFT ovary. Color and duplex Doppler ultrasound was utilized to evaluate  blood flow to the ovaries. COMPARISON:  None FINDINGS: Uterus Measurements: 12.6 x 5.9 x 7.8 cm = volume: 3 L4 mL. Anteverted. Exophytic leiomyoma at fundus 4.6 x 3.3 x 4.4 cm. Additional intramural leiomyomata are seen at the upper uterus measuring 2.6 x 2.6 x 2.7 cm and 2.5 x 1.7 x 2.6 cm. The 2.7 cm diameter leiomyoma appears to extend submucosal. Remaining myometrium is heterogeneous, suspect additional small poorly defined leiomyomata. Endometrium Thickness: 10 mm.  No endometrial fluid or focal abnormality Right ovary Measurements: 3.5 x 2.2 x 2.0 cm = volume: 7.8 mL. Normal morphology without mass. Internal blood flow present on color Doppler imaging. Left ovary Measurements: 2.6 x 1.7 x 2.0 cm = volume: 4.6 mL. Normal morphology without mass. Internal blood flow present on color Doppler imaging. Pulsed Doppler evaluation of both ovaries demonstrates normal low-resistance arterial and venous waveforms. Other findings No free pelvic fluid.  No adnexal masses. IMPRESSION: Heterogeneous uterus containing multiple leiomyomata. An anterior wall 2.7 cm diameter leiomyoma appears to extend submucosal. Normal appearing ovaries without evidence of mass or ovarian torsion. Electronically Signed   By: Lavonia Dana M.D.   On: 07/02/2019 11:13    Procedures Procedures (including critical care time)  Medications Ordered in ED Medications  acetaminophen (TYLENOL) tablet 650 mg (650 mg Oral Given 07/02/19 1121)  ibuprofen (ADVIL) tablet 600 mg (600 mg Oral Given 07/02/19 1259)     Initial Impression / Assessment and Plan / ED Course  I have reviewed the triage vital signs and the nursing notes.  Pertinent labs & imaging results that were available during my care of the patient were reviewed by me and considered in my medical decision making (see chart for details).    48 year old female presents today with menstrual cramps beginning last night, she started her menstrual cycle last night per usual schedule.   No history of fever chills, nausea/vomiting, upper abdominal pain, diarrhea or constipation.  She is overall well-appearing and in no  acute distress.  Heart regular rate and rhythm, lungs clear, abdomen soft minimal tenderness to the lower abdomen without focal pain, neurovascular intact to all 4 extremities without sign of DVT.  She deferred pelvic examination today stating she has an OB/GYN and rather follow-up there, she denies concern for STI today.  She is agreeable to pelvic ultrasound. - CBC hemoglobin 9.5, consistent with prior, WBC 5.0 CMP nonacute Lipase of the normal limits Pregnancy test negative US Pelvic w/ transvaginal and torsion r/o:  IMPRESSION:  Heterogeneous uterus containing multiple leiomyomata.    An anterior wall 2.7 cm diameter leiomyoma appears to extend  submucosal.    Normal appearing ovaries without evidence of mass or ovarian  torsion.  - Patient reassessed she is resting comfortably no acute distress.  She is unaware that she had fibroids prior to today.  Shared decision making made with patient she chooses to be discharged at this time with outpatient OB/GYN evaluation for pelvic examination.  I have low suspicion for PID or infectious etiology of her symptoms today suspect menstrual cramping worsened by her fibroids, there is no sign of torsion or abscess on ultrasound.  On examination of her abdomen it is soft and there is no focal tenderness in the right upper, right lower or left lower quadrants, she has no peritoneal signs, distention and has normal bowel movements this morning.  She had breakfast before she arrived and is requesting food for lunch now I have low suspicion for appendicitis, diverticulitis, obstruction, perforation or other acute abdominal pelvic pathologies at this time.  She will continue OTC anti-inflammatories and call her OB/GYN today to schedule a follow-up appointment.  Of note patient refused to provide a urinalysis today as she gave one  last night which showed hemoglobin, rare bacteria, no evidence of UTI.  At this time there does not appear to be any evidence of an acute emergency medical condition and the patient appears stable for discharge with appropriate outpatient follow up. Diagnosis was discussed with patient who verbalizes understanding of care plan and is agreeable to discharge. I have discussed return precautions with patient who verbalizes understanding of return precautions. Patient encouraged to follow-up with their PCP and OBGYN. All questions answered.  Patient's case discussed with Dr. Ronnald Nian who agrees with plan to discharge with follow-up.   Note: Portions of this report may have been transcribed using voice recognition software. Every effort was made to ensure accuracy; however, inadvertent computerized transcription errors may still be present. Final Clinical Impressions(s) / ED Diagnoses   Final diagnoses:  Menstrual cramps  Lower abdominal pain    ED Discharge Orders    None       Deliah Boston, PA-C 07/02/19 1324    Lennice Sites, DO 07/02/19 1523

## 2019-07-02 NOTE — Discharge Instructions (Signed)
You have been diagnosed today with lower abdominal pain.  At this time there does not appear to be the presence of an emergent medical condition, however there is always the potential for conditions to change. Please read and follow the below instructions.  Please return to the Emergency Department immediately for any new or worsening symptoms. Please be sure to follow up with your Primary Care Provider within one week regarding your visit today; please call their office to schedule an appointment even if you are feeling better for a follow-up visit. Your ultrasound today showed a heterogeneous uterus containing multiple leiomyomata.  An anterior wall 2.7 cm diameter leiomyoma appears to extend submucosal.  It is likely that these fibroids are contributing to your menstrual cramping pain.  Please discuss the ultrasound results and your visit to the ER today with your OB/GYN and your primary care provider at your next visit.  Call your OB/GYN's office today to inform them of your visit and need for follow-up.  You may use over-the-counter anti-inflammatories such as ibuprofen and Tylenol as directed on the packaging to help with your symptoms.  Get help right away if you: Pass out (faint). You have fever or chills Have pain in the area between your hip bones that suddenly gets worse. Your pain does not go away as soon as your doctor says it should. You cannot stop throwing up. Your pain is only in areas of your belly, such as the right side or the left lower part of the belly. You have bloody or black poop, or poop that looks like tar. You have very bad pain, cramping, or bloating in your belly. You have signs of not having enough fluid or water in your body (dehydration), such as: Dark pee, very little pee, or no pee. Cracked lips. Dry mouth. Sunken eyes. Sleepiness. Weakness. Have bleeding that soaks a tampon or pad in 30 minutes or less. You have any new/concerning or worsening  symptoms  Please read the additional information packets attached to your discharge summary.  Do not take your medicine if  develop an itchy rash, swelling in your mouth or lips, or difficulty breathing; call 911 and seek immediate emergency medical attention if this occurs.  Note: Portions of this text may have been transcribed using voice recognition software. Every effort was made to ensure accuracy; however, inadvertent computerized transcription errors may still be present.

## 2019-07-02 NOTE — ED Notes (Signed)
ED PA at the bedside 

## 2019-08-23 ENCOUNTER — Emergency Department (HOSPITAL_COMMUNITY)
Admission: EM | Admit: 2019-08-23 | Discharge: 2019-08-24 | Disposition: A | Discharge status: Home / Self Care | Payer: BC Managed Care – PPO | Attending: Emergency Medicine | Admitting: Emergency Medicine

## 2019-08-23 ENCOUNTER — Emergency Department (HOSPITAL_COMMUNITY): Payer: BC Managed Care – PPO

## 2019-08-23 ENCOUNTER — Other Ambulatory Visit: Payer: Self-pay

## 2019-08-23 ENCOUNTER — Encounter (HOSPITAL_COMMUNITY): Payer: Self-pay | Admitting: Emergency Medicine

## 2019-08-23 DIAGNOSIS — R079 Chest pain, unspecified: Secondary | ICD-10-CM | POA: Diagnosis present

## 2019-08-23 DIAGNOSIS — Z79899 Other long term (current) drug therapy: Secondary | ICD-10-CM | POA: Diagnosis not present

## 2019-08-23 DIAGNOSIS — Z87891 Personal history of nicotine dependence: Secondary | ICD-10-CM | POA: Insufficient documentation

## 2019-08-23 DIAGNOSIS — J45909 Unspecified asthma, uncomplicated: Secondary | ICD-10-CM | POA: Diagnosis not present

## 2019-08-23 DIAGNOSIS — Z20822 Contact with and (suspected) exposure to covid-19: Secondary | ICD-10-CM

## 2019-08-23 DIAGNOSIS — U071 COVID-19: Secondary | ICD-10-CM | POA: Insufficient documentation

## 2019-08-23 DIAGNOSIS — Z86718 Personal history of other venous thrombosis and embolism: Secondary | ICD-10-CM | POA: Diagnosis not present

## 2019-08-23 DIAGNOSIS — Z7901 Long term (current) use of anticoagulants: Secondary | ICD-10-CM | POA: Insufficient documentation

## 2019-08-23 LAB — CBC
HCT: 33 % — ABNORMAL LOW (ref 36.0–46.0)
Hemoglobin: 10.1 g/dL — ABNORMAL LOW (ref 12.0–15.0)
MCH: 24.8 pg — ABNORMAL LOW (ref 26.0–34.0)
MCHC: 30.6 g/dL (ref 30.0–36.0)
MCV: 81.1 fL (ref 80.0–100.0)
Platelets: 266 10*3/uL (ref 150–400)
RBC: 4.07 MIL/uL (ref 3.87–5.11)
RDW: 17.5 % — ABNORMAL HIGH (ref 11.5–15.5)
WBC: 7.5 10*3/uL (ref 4.0–10.5)
nRBC: 0 % (ref 0.0–0.2)

## 2019-08-23 MED ORDER — SODIUM CHLORIDE 0.9% FLUSH: 3.0000 mL | Freq: Once | INTRAVENOUS | Status: DC

## 2019-08-23 MED ORDER — ACETAMINOPHEN 500 MG PO TABS: 500.0000 mg | ORAL_TABLET | Freq: Four times a day (QID) | ORAL | 0 refills | Status: DC | PRN

## 2019-08-23 MED ORDER — SODIUM CHLORIDE 0.9 % IV BOLUS: 1000.0000 mL | Freq: Once | INTRAVENOUS | Status: DC

## 2019-08-23 MED ORDER — ACETAMINOPHEN 500 MG PO TABS
1000.0000 mg | ORAL_TABLET | Freq: Once | ORAL | Status: AC
  Administered 2019-08-23: 1000 mg via ORAL
  Filled 2019-08-23: qty 2

## 2019-08-23 MED ORDER — ONDANSETRON HCL 4 MG/2ML IJ SOLN
4.0000 mg | Freq: Once | INTRAMUSCULAR | Status: DC
  Filled 2019-08-23: qty 2

## 2019-08-23 MED ORDER — ONDANSETRON HCL 4 MG PO TABS: 4.0000 mg | ORAL_TABLET | Freq: Four times a day (QID) | ORAL | 0 refills | Status: DC

## 2019-08-23 MED ORDER — MORPHINE SULFATE (PF) 4 MG/ML IV SOLN
4.0000 mg | Freq: Once | INTRAVENOUS | Status: DC
  Filled 2019-08-23: qty 1

## 2019-08-23 MED ORDER — BENZONATATE 100 MG PO CAPS: 100.0000 mg | ORAL_CAPSULE | Freq: Three times a day (TID) | ORAL | 0 refills | Status: DC

## 2019-08-23 NOTE — Discharge Instructions (Signed)
Your symptoms are suggestive of a viral infection, likely COVID-19 given for work exposure to covid patients.  Please rest, hydrates, alternate between tylenol and ibuprofen for aches and pain.  Take Tessalon if you develop cough.  Take zofran as needed for nausea.  Return if you have any concerns.

## 2019-08-23 NOTE — ED Provider Notes (Signed)
[facility] DEPT Provider Note   CSN: [id number] Arrival date & time: 08/23/19  1651     History Chief Complaint  Patient presents with  . Asthma  . Chest Pain    Yesenia Wheeler is a 49 y.o. female.  The history is provided by the patient. No language interpreter was used.  Asthma Associated symptoms include chest pain.  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  Patient report for the past 2 days she has gradual onset of fever, chills, body aches, chest pain, headache, mild shortness of breath, decrease in appetite and overall not feeling well.  She denies any significant sinus congestion, sneezing or coughing.  No dysuria or hematuria.  No loss of taste or smell.  She has been using rescue inhaler more than usual.  She denies any specific treatment tried at home.  She does work in environment in which she sees a lot of Covid patient.  She denies alcohol or tobacco abuse.    Past Medical History:  Diagnosis Date  . Acute meniscal tear of left knee   . Anemia   . 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 Migraines   . Mild asthma   . OA (osteoarthritis)    bilateral knees    Patient Active Problem List   Diagnosis Date Noted  . Left breast mass 10/23/2017  . Acute medial meniscus tear of left knee 08/30/2017  . BV (bacterial vaginosis) 06/23/2015  . Tinea pedis 06/21/2015  . Screening-pulmonary TB 03/15/2015  . Obesity (BMI 30-39.9) 05/26/2014  . Asthma, chronic 05/25/2014  . Migraine 05/25/2014  . Chronic back pain 05/25/2014    Past Surgical History:  Procedure Laterality Date  . BREAST LUMPECTOMY WITH RADIOACTIVE  SEED LOCALIZATION Left 10/23/2017   Procedure: BREAST LUMPECTOMY WITH RADIOACTIVE SEED LOCALIZATION;  Surgeon: Erroll Luna, MD;  Location: [facility];  Service: General;  Laterality: Left;  . BREAST REDUCTION SURGERY Bilateral 10/23/2017   Procedure: LEFT BREAST ONCOPLASTIC REDUCTION, RIGHT BREAST REDUCTION;  Surgeon: Irene Limbo, MD;  Location: [facility];  Service: Plastics;  Laterality: Bilateral;  . CESAREAN SECTION  2011  . GASTRIC BYPASS  2001  . KNEE ARTHROSCOPY W/ MENISCECTOMY Right 2007  . KNEE ARTHROSCOPY WITH MEDIAL MENISECTOMY Left 08/30/2017   Procedure: KNEE ARTHROSCOPY WITH PARTIAL MEDIAL MENISECTOMY;  Surgeon: Nicholes Stairs, MD;  Location: [facility];  Service: Orthopedics;  Laterality: Left;  60 mins  . LAPAROSCOPIC CHOLECYSTECTOMY  2001  . SHOULDER ARTHROSCOPY Left 2008   spur     OB History    Gravida  13   Para      Term      Preterm      AB  13   Living  3     SAB  13   TAB      Ectopic      Multiple      Live Births              Family History  Problem Relation Age of Onset  . Cancer Father  oral    Social History   Tobacco Use  . Smoking status: Former Smoker    Years: 6.00    Types: Cigarettes    Quit date: 06/23/2017    Years since quitting: 2.1  . Smokeless tobacco: Never Used  . Tobacco comment: 2ppwk  Substance Use Topics  . Alcohol use: Yes    Comment: occasional  . Drug use: No    Home Medications Prior to Admission medications   Medication Sig Start Date End Date Taking? Authorizing Provider  albuterol (PROAIR HFA) 108 (90 Base) MCG/ACT inhaler Inhale into the lungs every 6 (six) hours as needed for wheezing or shortness of breath.    [provider]  enoxaparin (LOVENOX) 40 MG/0.4ML injection Inject 0.4 mLs (40 mg total) into the skin daily. Start 3.14.19 Patient not taking: Reported on 04/30/2018 10/24/17   Irene Limbo, MD  ondansetron  (ZOFRAN ODT) 4 MG disintegrating tablet Take 1 tablet (4 mg total) by mouth every 8 (eight) hours as needed for nausea or vomiting. Patient not taking: Reported on 04/30/2018 08/30/17   Nicholes Stairs, MD  oxyCODONE (OXY IR/ROXICODONE) 5 MG immediate release tablet Take 1-2 tablets (5-10 mg total) by mouth every 4 (four) hours as needed for moderate pain. Patient not taking: Reported on 04/30/2018 10/24/17   Irene Limbo, MD    Allergies    Hydrocodone and Oxycodone  Review of Systems   Review of Systems  Cardiovascular: Positive for chest pain.  All other systems reviewed and are negative.   Physical Exam Updated Vital Signs BP 120/84 (BP Location: Left Arm)   Pulse (!) 116   Temp (!) 101.1 F (38.4 C)   Resp 20   LMP 08/14/2018   SpO2 100%   Physical Exam Vitals and nursing note reviewed.  Constitutional:      General: She is not in acute distress.    Appearance: She is well-developed. She is obese.  HENT:     Head: Atraumatic.  Eyes:     Extraocular Movements: Extraocular movements intact.     Conjunctiva/sclera: Conjunctivae normal.     Pupils: Pupils are equal, round, and reactive to light.  Neck:     Comments: No nuchal rigidity Cardiovascular:     Rate and Rhythm: Tachycardia present.     Pulses: Normal pulses.     Heart sounds: Normal heart sounds.  Pulmonary:     Effort: Pulmonary effort is normal.     Breath sounds: Normal breath sounds. No wheezing, rhonchi or rales.  Abdominal:     Palpations: Abdomen is soft.     Tenderness: There is no abdominal tenderness.  Musculoskeletal:        General: No swelling.     Cervical back: Normal range of motion and neck supple.  Skin:    Findings: No rash.  Neurological:     Mental Status: She is alert and oriented to person, place, and time.  Psychiatric:        Mood and Affect: Mood normal.     ED Results / Procedures / Treatments   Labs (all labs ordered are listed, but only abnormal results are  displayed) Labs Reviewed  CBC - Abnormal; Notable for the following components:      Result Value   Hemoglobin 10.1 (*)    HCT 33.0 (*)    MCH 24.8 (*)    RDW 17.5 (*)    All other components within normal limits  SARS CORONAVIRUS 2 (TAT 6-24 HRS)  BASIC METABOLIC PANEL  POC URINE PREG, ED  TROPONIN I (HIGH SENSITIVITY)    EKG None  ED ECG REPORT   Date: 08/23/2019  Rate: 114  Rhythm: sinus tachycardia  QRS Axis: normal  Intervals: normal  ST/T Wave abnormalities: abnormal R wave progression  Conduction Disutrbances:first-degree A-V block   Narrative Interpretation:   Old EKG Reviewed: unchanged  I have personally reviewed the EKG tracing and agree with the computerized printout as noted.   Radiology DG Chest 2 View  Result Date: 08/23/2019 CLINICAL DATA:  Chest tightness EXAM: CHEST - 2 VIEW COMPARISON:  11/24/2016 FINDINGS: Heart and mediastinal contours are within normal limits. No focal opacities or effusions. No acute bony abnormality. IMPRESSION: No active cardiopulmonary disease. Electronically Signed   By: Rolm Baptise M.D.   On: 08/23/2019 18:01    Procedures Procedures (including critical care time)  Medications Ordered in ED Medications  sodium chloride flush (NS) 0.9 % injection 3 mL (has no administration in time range)    ED Course  I have reviewed the triage vital signs and the nursing notes.  Pertinent labs & imaging results that were available during my care of the patient were reviewed by me and considered in my medical decision making (see chart for details).    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  She is not hypoxic.  Initial chest x-ray unremarkable.  Work-up initiated.  Will provide symptomatic treatment.  She has a fever of 101.1, Tylenol given.  She is tachycardic likely secondary to her fever.    11:03 PM Fever improves with tylenol.  Pt is not hypoxic, CXR normal, she is not hypotensive.  She is not vomiting.  Pt is stable for discharge.  Her sxs is not consistent with ACS.   Yesenia Wheeler was evaluated in Emergency Department on 08/23/2019 for the symptoms described in the history of present illness. She was evaluated in the context of the global COVID-19 pandemic, which necessitated consideration that the patient might be at risk for infection with the SARS-CoV-2 virus that causes COVID-19. Institutional protocols and algorithms that pertain to the evaluation of patients at risk for COVID-19 are in a state of rapid change based on information released by regulatory bodies including the CDC and federal and state organizations. These policies and algorithms were followed during the patient's care in the ED.    Domenic Moras, PA-C 08/23/19 2345    Maudie Flakes, MD 08/24/19 1501

## 2019-08-23 NOTE — ED Notes (Signed)
Only able to collect CBC during time of blood draw.

## 2019-08-23 NOTE — ED Triage Notes (Signed)
Pt c/o chills, chest tightness, asthma and not feeling well since yesterday. Works at a [employer].

## 2019-08-24 LAB — SARS CORONAVIRUS 2 (TAT 6-24 HRS): SARS Coronavirus 2: POSITIVE — AB

## 2019-08-24 NOTE — ED Notes (Signed)
PT provided discharge instructions and locations to pick up prescriptions. Pt A/O x4 and ambulatory.

## 2019-08-25 ENCOUNTER — Telehealth (HOSPITAL_COMMUNITY): Payer: Self-pay

## 2020-03-11 ENCOUNTER — Emergency Department (HOSPITAL_BASED_OUTPATIENT_CLINIC_OR_DEPARTMENT_OTHER): Payer: BC Managed Care – PPO

## 2020-03-11 ENCOUNTER — Emergency Department (HOSPITAL_COMMUNITY)
Admission: EM | Admit: 2020-03-11 | Discharge: 2020-03-12 | Disposition: A | Discharge status: Home / Self Care | Payer: BC Managed Care – PPO | Attending: Emergency Medicine | Admitting: Emergency Medicine

## 2020-03-11 ENCOUNTER — Encounter (HOSPITAL_COMMUNITY): Payer: Self-pay

## 2020-03-11 DIAGNOSIS — M79609 Pain in unspecified limb: Secondary | ICD-10-CM | POA: Diagnosis not present

## 2020-03-11 DIAGNOSIS — Z5321 Procedure and treatment not carried out due to patient leaving prior to being seen by health care provider: Secondary | ICD-10-CM | POA: Diagnosis not present

## 2020-03-11 DIAGNOSIS — M79604 Pain in right leg: Secondary | ICD-10-CM | POA: Insufficient documentation

## 2020-03-11 DIAGNOSIS — M7989 Other specified soft tissue disorders: Secondary | ICD-10-CM

## 2020-03-11 NOTE — ED Provider Notes (Cosign Needed)
49 year old female presents with R leg pain. DVT study was ordered in triage by nursing. Discussed with vascular tech who states she is positive for a soleal DVT but they are unsure if this is acute or chronic   Recardo Evangelist, PA-C 03/11/20 2032

## 2020-03-11 NOTE — ED Triage Notes (Signed)
Patient presents to the ED with c/o R leg pain. Patient concern for blood clot after driving for an extended period of time last week. HX of blood clots. Denies shortness of breath, CP. No acute distress. Respirs even and unlabored. AAOx3.

## 2020-03-11 NOTE — Progress Notes (Signed)
Right lower extremity venous duplex has been completed. Preliminary results can be found in CV Proc through chart review.  Results were given to Janetta Hora PA.  03/11/20 6:44 PM Carlos Levering RVT

## 2020-03-12 NOTE — ED Notes (Signed)
Pt called secretary to say that she left

## 2020-03-16 ENCOUNTER — Ambulatory Visit (HOSPITAL_COMMUNITY)
Admission: EM | Admit: 2020-03-16 | Discharge: 2020-03-16 | Disposition: A | Discharge status: Home / Self Care | Payer: BC Managed Care – PPO | Attending: Family Medicine | Admitting: Family Medicine

## 2020-03-16 ENCOUNTER — Encounter (HOSPITAL_COMMUNITY): Payer: Self-pay

## 2020-03-16 ENCOUNTER — Ambulatory Visit (INDEPENDENT_AMBULATORY_CARE_PROVIDER_SITE_OTHER): Payer: BC Managed Care – PPO

## 2020-03-16 ENCOUNTER — Other Ambulatory Visit: Payer: Self-pay

## 2020-03-16 DIAGNOSIS — S9031XA Contusion of right foot, initial encounter: Secondary | ICD-10-CM

## 2020-03-16 DIAGNOSIS — M79671 Pain in right foot: Secondary | ICD-10-CM | POA: Diagnosis not present

## 2020-03-16 MED ORDER — IBUPROFEN 600 MG PO TABS: 600.0000 mg | ORAL_TABLET | Freq: Four times a day (QID) | ORAL | 0 refills | Status: DC | PRN

## 2020-03-16 NOTE — ED Triage Notes (Signed)
Pt presents with right foot injury X 2 days ago after wrestling with her 150 lb great dane.

## 2020-03-16 NOTE — ED Provider Notes (Signed)
[facility]    CSN: [id number] Arrival date & time: 03/16/20  0831      History   Chief Complaint Chief Complaint  Patient presents with  . Foot Pain    HPI Yesenia Wheeler is a 49 y.o. female.   HPI  Patient has a large great Dane, approximately 150 pounds.  He stepped on her foot.  Her foot been painful for 2 days.  Swollen.  Painful with weightbearing. History of recurring DVTs.  She states she currently has a DVT in her right lower extremity.  She is on anticoagulation.  Past Medical History:  Diagnosis Date  . Acute meniscal tear of left knee   . Anemia   . 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 Migraines   . Mild asthma   . OA (osteoarthritis)    bilateral knees    Patient Active Problem List   Diagnosis Date Noted  . Left breast mass 10/23/2017  . Acute medial meniscus tear of left knee 08/30/2017  . BV (bacterial vaginosis) 06/23/2015  . Tinea pedis 06/21/2015  . Screening-pulmonary TB 03/15/2015  . Obesity (BMI 30-39.9) 05/26/2014  . Asthma, chronic 05/25/2014  . Migraine 05/25/2014  . Chronic back pain 05/25/2014    Past Surgical History:  Procedure Laterality Date  . BREAST LUMPECTOMY WITH RADIOACTIVE SEED LOCALIZATION Left 10/23/2017   Procedure: BREAST LUMPECTOMY WITH RADIOACTIVE SEED LOCALIZATION;  Surgeon: Erroll Luna, MD;  Location: [facility];  Service: General;  Laterality: Left;  . BREAST REDUCTION SURGERY Bilateral 10/23/2017   Procedure: LEFT BREAST ONCOPLASTIC REDUCTION, RIGHT BREAST REDUCTION;  Surgeon: Irene Limbo, MD;  Location: [facility];  Service: Plastics;  Laterality: Bilateral;  . CESAREAN SECTION  2011  . GASTRIC BYPASS  2001  . KNEE ARTHROSCOPY W/ MENISCECTOMY Right 2007  . KNEE  ARTHROSCOPY WITH MEDIAL MENISECTOMY Left 08/30/2017   Procedure: KNEE ARTHROSCOPY WITH PARTIAL MEDIAL MENISECTOMY;  Surgeon: Nicholes Stairs, MD;  Location: [facility];  Service: Orthopedics;  Laterality: Left;  60 mins  . LAPAROSCOPIC CHOLECYSTECTOMY  2001  . SHOULDER ARTHROSCOPY Left 2008   spur    OB History    Gravida  13   Para      Term      Preterm      AB  13   Living  3     SAB  13   TAB      Ectopic      Multiple      Live Births               Home Medications    Prior to Admission medications   Medication Sig Start Date End Date Taking? Authorizing Provider  acetaminophen (TYLENOL) 500 MG tablet Take 1 tablet (500 mg total) by mouth every 6 (six) hours as needed. 08/23/19   Domenic Moras, PA-C  albuterol [facility] HFA) [phone] Base) MCG/ACT inhaler Inhale into the lungs every 6 (six) hours as needed for wheezing or shortness of breath.    [provider]  ibuprofen (ADVIL) 600 MG tablet Take 1 tablet (600 mg total) by mouth every 6 (six) hours as needed. 03/16/20   Raylene Everts, MD  enoxaparin (LOVENOX) 40 MG/0.4ML injection  Inject 0.4 mLs (40 mg total) into the skin daily. Start 3.14.19 Patient not taking: Reported on 04/30/2018 10/24/17 03/16/20  Irene Limbo, MD    Family History Family History  Problem Relation Age of Onset  . Cancer Father        oral    Social History Social History   Tobacco Use  . Smoking status: Former Smoker    Years: 6.00    Types: Cigarettes    Quit date: 06/23/2017    Years since quitting: 2.7  . Smokeless tobacco: Never Used  . Tobacco comment: 2ppwk  Vaping Use  . Vaping Use: Former  Substance Use Topics  . Alcohol use: Yes    Comment: occasional  . Drug use: No     Allergies   Hydrocodone and Oxycodone   Review of Systems Review of Systems  See HPI Physical Exam Triage Vital Signs ED Triage Vitals [03/16/20 0857]  Enc Vitals Group     BP      Pulse       Resp      Temp      Temp src      SpO2      Weight      Height      Head Circumference      Peak Flow      Pain Score 8     Pain Loc      Pain Edu?      Excl. in [place]?    No data found.  Updated Vital Signs BP 115/83 (BP Location: Left Arm)   Pulse (!) 59   Temp 98.1 F (36.7 C) (Oral)   Resp 18   LMP 02/26/2020   SpO2 92%     Physical Exam Musculoskeletal:     Comments: Right foot examined.  Tenderness diffusely over the metatarsals, two three and four.  Pain with movement of middle toes.  Pain with movement of ankle.  No tenderness over the medial or lateral malleoli.  No specific tenderness over the proximal fifth metatarsal      UC Treatments / Results  Labs (all labs ordered are listed, but only abnormal results are displayed) Labs Reviewed - No data to display  EKG   Radiology DG Foot Complete Right  Result Date: 03/16/2020 CLINICAL DATA:  Right foot pain EXAM: RIGHT FOOT COMPLETE - 3+ VIEW COMPARISON:  None. FINDINGS: Small calcaneal spur. No acute bony abnormality. Specifically, no fracture, subluxation, or dislocation. Joint spaces maintained. Soft tissues are intact. IMPRESSION: No acute bony abnormality. Electronically Signed   By: Rolm Baptise M.D.   On: 03/16/2020 10:05    Procedures Procedures (including critical care time)  Medications Ordered in UC Medications - No data to display  Initial Impression / Assessment and Plan / UC Course  I have reviewed the triage vital signs and the nursing notes.  Pertinent labs & imaging results that were available during my care of the patient were reviewed by me and considered in my medical decision making (see chart for details).     I told the patient the x-ray is negative.  Foot contusion.  Conservative management appropriate.  She has a primary care doctor to call if she fails to improve Final Clinical Impressions(s) / UC Diagnoses   Final diagnoses:  Contusion of right foot, initial encounter      Discharge Instructions     Take ibuprofen 3 times a day with food Stay off of your foot as much as you are  able Expect improvement over the next week or 2 Follow-up with your primary care doctor if you fail to improve    ED Prescriptions    Medication Sig Dispense Auth. Provider   ibuprofen (ADVIL) 600 MG tablet Take 1 tablet (600 mg total) by mouth every 6 (six) hours as needed. 30 tablet Raylene Everts, MD     PDMP not reviewed this encounter.   Raylene Everts, MD 03/16/20 1710

## 2020-03-16 NOTE — Discharge Instructions (Signed)
Take ibuprofen 3 times a day with food Stay off of your foot as much as you are able Expect improvement over the next week or 2 Follow-up with your primary care doctor if you fail to improve

## 2020-04-13 ENCOUNTER — Telehealth: Payer: Self-pay | Admitting: Oncology

## 2020-04-13 NOTE — Telephone Encounter (Signed)
Received a new hem referral from [place], [place] for thrombophilia. Yesenia Wheeler returned my call and has been scheduled to see Dr. Alen Blew on 9/28 at 11am. Pt aware to arrive 15 minutes early.

## 2020-05-11 ENCOUNTER — Other Ambulatory Visit: Payer: Self-pay

## 2020-05-11 ENCOUNTER — Inpatient Hospital Stay: Payer: BC Managed Care – PPO | Attending: Oncology | Admitting: Oncology

## 2020-05-11 VITALS — BP 127/79 | HR 81 | Temp 98.1°F | Resp 18 | Ht 64.0 in | Wt 221.8 lb

## 2020-05-11 DIAGNOSIS — Z86718 Personal history of other venous thrombosis and embolism: Secondary | ICD-10-CM | POA: Insufficient documentation

## 2020-05-11 DIAGNOSIS — Z7901 Long term (current) use of anticoagulants: Secondary | ICD-10-CM | POA: Diagnosis not present

## 2020-05-11 DIAGNOSIS — I824Y9 Acute embolism and thrombosis of unspecified deep veins of unspecified proximal lower extremity: Secondary | ICD-10-CM | POA: Diagnosis not present

## 2020-05-11 NOTE — Progress Notes (Signed)
Reason for the request:    DVT  HPI: I was asked by Tamela Oddi FNP to evaluate Yesenia Wheeler for the evaluation of recurrent deep vein thrombosis.  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  She had 2 episodes during pregnancy with another deep vein thrombosis her right lower extremity in June 2016.  She presented with pain and swelling in her right leg and found to have protein S antigen right soleal vein thrombosis although these could not be determined.  Left lower extremity did not show any evidence of venous thrombosis.  Hypercoagulable work-up obtained in July 2021 showed a protein S antigen to be normal at 90, protein C antigen slightly declined at 65 with protein C activity at 53 which is declined.  Protein S activity is also decline at 49%.  Antithrombin III level was normal, antiphospholipid antibody panel is normal except for equivocal phosphatidylserine IgG at 18.  She was started on Eliquis starting at the end of July 2021.  Previously, she was on Coumadin for 6 months after pregnancy induced deep vein thrombosis.  She has tolerated Eliquis well without any recent complications.  She denies any recent provoking symptoms including prolonged immobilization or travel.  Previous blood clots were induced by pregnancy and 2 of them are unprovoked.  She does not report any headaches, blurry vision, syncope or seizures. Does not report any fevers, chills or sweats.  Does not report any cough, wheezing or hemoptysis.  Does not report any chest pain, palpitation, orthopnea or leg edema.  Does not report any nausea, vomiting or abdominal pain.  Does not report any constipation or diarrhea.  Does not report any skeletal complaints.    Does not report frequency, urgency or hematuria.  Does not report any skin rashes or lesions. Does not report any heat or cold intolerance.  Does not report any  lymphadenopathy or petechiae.  Does not report any anxiety or depression.  Remaining review of systems is negative.    Past Medical History:  Diagnosis Date  . Acute meniscal tear of left knee   . Anemia   . 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 Migraines   . Mild asthma   . OA (osteoarthritis)    bilateral knees  :  Past Surgical History:  Procedure Laterality Date  . BREAST LUMPECTOMY WITH RADIOACTIVE SEED LOCALIZATION Left 10/23/2017   Procedure: BREAST LUMPECTOMY WITH RADIOACTIVE SEED LOCALIZATION;  Surgeon: Erroll Luna, MD;  Location: [facility] [place];  Service: General;  Laterality: Left;  . BREAST REDUCTION SURGERY Bilateral 10/23/2017   Procedure: LEFT BREAST ONCOPLASTIC REDUCTION, RIGHT BREAST REDUCTION;  Surgeon: Irene Limbo, MD;  Location: [facility];  Service: Plastics;  Laterality: Bilateral;  . CESAREAN SECTION  2011  . GASTRIC BYPASS  2001  . KNEE ARTHROSCOPY W/ MENISCECTOMY Right 2007  . KNEE ARTHROSCOPY WITH MEDIAL MENISECTOMY Left 08/30/2017   Procedure: KNEE ARTHROSCOPY WITH PARTIAL MEDIAL MENISECTOMY;  Surgeon: Nicholes Stairs, MD;  Location: [facility];  Service: Orthopedics;  Laterality: Left;  60 mins  . LAPAROSCOPIC CHOLECYSTECTOMY  2001  . SHOULDER ARTHROSCOPY Left 2008   spur  :  Current Outpatient Medications:  .  acetaminophen (TYLENOL) 500 MG tablet, Take 1 tablet (500 mg total) by mouth every 6 (six) hours as needed., Disp: 30 tablet, Rfl: 0 .  albuterol (PROAIR HFA) 108 (90 Base) MCG/ACT inhaler, Inhale into the lungs every 6 (six) hours as needed for wheezing or shortness of breath., Disp: , Rfl:  .  ibuprofen (ADVIL) 600 MG tablet, Take 1 tablet (600 mg total) by mouth every 6 (six) hours as needed.,  Disp: 30 tablet, Rfl: 0:  Allergies  Allergen Reactions  . Hydrocodone   . Oxycodone Other (See Comments)    Chest pain  :  Family History  Problem Relation Age of Onset  . Cancer Father        oral  :  Social History   Socioeconomic History  . Marital status: Married    Spouse name: [name]  . Number of children: 2  . Years of education: Associates  . Highest education level: Not on file  Occupational History  . Occupation: Economist: [employer]  Tobacco Use  . Smoking status: Former Smoker    Years: 6.00    Types: Cigarettes    Quit date: 06/23/2017    Years since quitting: 2.8  . Smokeless tobacco: Never Used  . Tobacco comment: 2ppwk  Vaping Use  . Vaping Use: Former  Substance and Sexual Activity  . Alcohol use: Yes    Comment: occasional  . Drug use: No  . Sexual activity: Not on file  Other Topics Concern  . Not on file  Social History Narrative   Lives with her husband and their children   Social Determinants of Health   Financial Resource Strain:   . Difficulty of Paying Living Expenses: Not on file  Food Insecurity:   . Worried About Charity fundraiser in the Last Year: Not on file  . Ran Out of Food in the Last Year: Not on file  Transportation Needs:   . Lack of Transportation (Medical): Not on file  . Lack of Transportation (Non-Medical): Not on file  Physical Activity:   . Days of Exercise per Week: Not on file  . Minutes of Exercise per Session: Not on file  Stress:   . Feeling of Stress : Not on file  Social Connections:   . Frequency of Communication with Friends and Family: Not on file  . Frequency of Social Gatherings with Friends and Family: Not on file  . Attends Religious Services: Not on file  . Active Member of Clubs or Organizations: Not on file  . Attends Archivist Meetings: Not on file  . Marital Status: Not on file  Intimate Partner Violence:   . Fear of Current or Ex-Partner: Not on file   . Emotionally Abused: Not on file  . Physically Abused: Not on file  . Sexually Abused: Not on file  :  Pertinent items are noted in HPI.  Exam: Blood pressure 127/79, pulse 81, temperature 98.1 F (36.7 C), temperature source Tympanic, resp. rate 18, height 5\' 4"  (1.626 m), weight 221 lb 12.8 oz (100.6 kg), SpO2 100 %.  ECOG 0  General appearance: alert and cooperative appeared without distress. Head: atraumatic without any abnormalities. Eyes: conjunctivae/corneas clear. PERRL.  Sclera anicteric. Throat: lips, mucosa, and tongue normal; without oral thrush or ulcers. Resp: clear to auscultation bilaterally without rhonchi, wheezes or dullness to percussion. Cardio: regular rate and rhythm, S1, S2 normal, no murmur, click, rub  or gallop GI: soft, non-tender; bowel sounds normal; no masses,  no organomegaly Skin: Skin color, texture, turgor normal. No rashes or lesions Lymph nodes: Cervical, supraclavicular, and axillary nodes normal. Neurologic: Grossly normal without any motor, sensory or deep tendon reflexes. Musculoskeletal: No joint deformity or effusion.    Assessment and Plan:   49 year old with:  1.  Recurrent deep vein thrombosis for at least the last 25 years.  He describes 4-5 episodes 2 of them more provoked by pregnancy but some more unprovoked.  The most recent was on July 29 presenting with right lower extremity deep vein thrombosis.  Hypercoagulable panel is suspicious for decreased protein S and protein C activity although it is unclear whether this is related to her recent thrombosis versus a true phenomena.  The natural course of this disease and the differential diagnosis was discussed.  Her clinical story is very suspicious for inherited thrombophilia and long-term anticoagulation is warranted given her recurrent thrombosis.  Risks and benefits of continuing Eliquis long-term were discussed.  These complications including recurrent bleeding occluding  menorrhagia were reviewed.  At this time she is handling it reasonably well and I recommended long-term anticoagulation likely lifetime.  I will reevaluate in 6 months to discuss risks and benefits further.  At this time she is agreeable to continue Eliquis long-term.  2.  Follow-up: Will be in 6 months.  45  minutes were dedicated to this visit. The time was spent on reviewing laboratory data, discussing treatment options, discussing differential diagnosis and answering questions regarding future plan.     A copy of this consult has been forwarded to the requesting provider.

## 2020-09-14 DIAGNOSIS — K56609 Unspecified intestinal obstruction, unspecified as to partial versus complete obstruction: Secondary | ICD-10-CM

## 2020-09-14 HISTORY — DX: Unspecified intestinal obstruction, unspecified as to partial versus complete obstruction: K56.609

## 2020-09-28 ENCOUNTER — Other Ambulatory Visit: Payer: Self-pay

## 2020-09-28 ENCOUNTER — Observation Stay (HOSPITAL_COMMUNITY)
Admission: EM | Admit: 2020-09-28 | Discharge: 2020-10-01 | Disposition: A | Discharge status: Home / Self Care | Payer: BC Managed Care – PPO | Attending: General Surgery | Admitting: General Surgery

## 2020-09-28 ENCOUNTER — Inpatient Hospital Stay (HOSPITAL_COMMUNITY): Payer: BC Managed Care – PPO

## 2020-09-28 ENCOUNTER — Emergency Department (HOSPITAL_COMMUNITY): Payer: BC Managed Care – PPO

## 2020-09-28 DIAGNOSIS — Z4659 Encounter for fitting and adjustment of other gastrointestinal appliance and device: Secondary | ICD-10-CM

## 2020-09-28 DIAGNOSIS — M170 Bilateral primary osteoarthritis of knee: Secondary | ICD-10-CM | POA: Diagnosis present

## 2020-09-28 DIAGNOSIS — Z7901 Long term (current) use of anticoagulants: Secondary | ICD-10-CM | POA: Diagnosis not present

## 2020-09-28 DIAGNOSIS — K56609 Unspecified intestinal obstruction, unspecified as to partial versus complete obstruction: Principal | ICD-10-CM | POA: Insufficient documentation

## 2020-09-28 DIAGNOSIS — Z86718 Personal history of other venous thrombosis and embolism: Secondary | ICD-10-CM | POA: Diagnosis not present

## 2020-09-28 DIAGNOSIS — E669 Obesity, unspecified: Secondary | ICD-10-CM | POA: Insufficient documentation

## 2020-09-28 DIAGNOSIS — G43909 Migraine, unspecified, not intractable, without status migrainosus: Secondary | ICD-10-CM | POA: Diagnosis present

## 2020-09-28 DIAGNOSIS — Z6837 Body mass index (BMI) 37.0-37.9, adult: Secondary | ICD-10-CM

## 2020-09-28 DIAGNOSIS — J45909 Unspecified asthma, uncomplicated: Secondary | ICD-10-CM | POA: Diagnosis not present

## 2020-09-28 DIAGNOSIS — R1013 Epigastric pain: Secondary | ICD-10-CM | POA: Diagnosis present

## 2020-09-28 DIAGNOSIS — Z79899 Other long term (current) drug therapy: Secondary | ICD-10-CM

## 2020-09-28 DIAGNOSIS — Z885 Allergy status to narcotic agent status: Secondary | ICD-10-CM | POA: Diagnosis not present

## 2020-09-28 DIAGNOSIS — Z20822 Contact with and (suspected) exposure to covid-19: Secondary | ICD-10-CM | POA: Insufficient documentation

## 2020-09-28 DIAGNOSIS — K219 Gastro-esophageal reflux disease without esophagitis: Secondary | ICD-10-CM | POA: Diagnosis present

## 2020-09-28 DIAGNOSIS — D259 Leiomyoma of uterus, unspecified: Secondary | ICD-10-CM | POA: Diagnosis present

## 2020-09-28 DIAGNOSIS — K5651 Intestinal adhesions [bands], with partial obstruction: Principal | ICD-10-CM | POA: Diagnosis present

## 2020-09-28 DIAGNOSIS — Z9884 Bariatric surgery status: Secondary | ICD-10-CM | POA: Diagnosis not present

## 2020-09-28 DIAGNOSIS — Z9049 Acquired absence of other specified parts of digestive tract: Secondary | ICD-10-CM | POA: Insufficient documentation

## 2020-09-28 DIAGNOSIS — R1084 Generalized abdominal pain: Secondary | ICD-10-CM | POA: Diagnosis present

## 2020-09-28 DIAGNOSIS — Z87891 Personal history of nicotine dependence: Secondary | ICD-10-CM | POA: Insufficient documentation

## 2020-09-28 LAB — CBC
HCT: 30.3 % — ABNORMAL LOW (ref 36.0–46.0)
Hemoglobin: 9.7 g/dL — ABNORMAL LOW (ref 12.0–15.0)
MCH: 24.1 pg — ABNORMAL LOW (ref 26.0–34.0)
MCHC: 32 g/dL (ref 30.0–36.0)
MCV: 75.4 fL — ABNORMAL LOW (ref 80.0–100.0)
Platelets: 298 10*3/uL (ref 150–400)
RBC: 4.02 MIL/uL (ref 3.87–5.11)
RDW: 17.8 % — ABNORMAL HIGH (ref 11.5–15.5)
WBC: 7.8 10*3/uL (ref 4.0–10.5)
nRBC: 0 % (ref 0.0–0.2)

## 2020-09-28 LAB — COMPREHENSIVE METABOLIC PANEL
ALT: 13 U/L (ref 0–44)
AST: 25 U/L (ref 15–41)
Albumin: 3.5 g/dL (ref 3.5–5.0)
Alkaline Phosphatase: 112 U/L (ref 38–126)
Anion gap: 9 (ref 5–15)
BUN: 10 mg/dL (ref 6–20)
CO2: 21 mmol/L — ABNORMAL LOW (ref 22–32)
Calcium: 8.6 mg/dL — ABNORMAL LOW (ref 8.9–10.3)
Chloride: 110 mmol/L (ref 98–111)
Creatinine, Ser: 0.92 mg/dL (ref 0.44–1.00)
GFR, Estimated: >60 mL/min (ref >60)
Glucose, Bld: 97 mg/dL (ref 70–99)
Potassium: 4.1 mmol/L (ref 3.5–5.1)
Sodium: 140 mmol/L (ref 135–145)
Total Bilirubin: 0.6 mg/dL (ref 0.3–1.2)
Total Protein: 6.9 g/dL (ref 6.5–8.1)

## 2020-09-28 LAB — RESP PANEL BY RT-PCR (FLU A&B, COVID) ARPGX2
Influenza A by PCR: NEGATIVE
Influenza B by PCR: NEGATIVE
SARS Coronavirus 2 by RT PCR: NEGATIVE

## 2020-09-28 LAB — APTT: aPTT: 40 seconds — ABNORMAL HIGH (ref 24–36)

## 2020-09-28 LAB — LIPASE, BLOOD: Lipase: 36 U/L (ref 11–51)

## 2020-09-28 LAB — I-STAT BETA HCG BLOOD, ED (MC, WL, AP ONLY): I-stat hCG, quantitative: <5 m[IU]/mL (ref <5)

## 2020-09-28 LAB — HEPARIN LEVEL (UNFRACTIONATED): Heparin Unfractionated: 0.16 IU/mL — ABNORMAL LOW (ref 0.30–0.70)

## 2020-09-28 MED ORDER — DIATRIZOATE MEGLUMINE & SODIUM 66-10 % PO SOLN
90.0000 mL | Freq: Once | ORAL | Status: AC
  Administered 2020-09-28: 90 mL via NASOGASTRIC
  Filled 2020-09-28 (×2): qty 90

## 2020-09-28 MED ORDER — ALBUTEROL SULFATE HFA 108 (90 BASE) MCG/ACT IN AERS: 1.0000 | INHALATION_SPRAY | Freq: Four times a day (QID) | RESPIRATORY_TRACT | Status: DC | PRN

## 2020-09-28 MED ORDER — SODIUM CHLORIDE 0.9% FLUSH
10.0000 mL | Freq: Two times a day (BID) | INTRAVENOUS | Status: DC
  Administered 2020-09-28 – 2020-09-30 (×4): 10 mL

## 2020-09-28 MED ORDER — MORPHINE SULFATE (PF) 2 MG/ML IV SOLN: 2.0000 mg | Freq: Once | INTRAVENOUS | Status: DC

## 2020-09-28 MED ORDER — FENTANYL CITRATE (PF) 100 MCG/2ML IJ SOLN: 100.0000 ug | Freq: Once | INTRAMUSCULAR | Status: DC

## 2020-09-28 MED ORDER — MORPHINE SULFATE (PF) 2 MG/ML IV SOLN
1.0000 mg | INTRAVENOUS | Status: DC | PRN
  Administered 2020-09-28 – 2020-09-30 (×8): 2 mg via INTRAVENOUS
  Administered 2020-09-30: 4 mg via INTRAVENOUS
  Filled 2020-09-28 (×3): qty 1
  Filled 2020-09-28: qty 2
  Filled 2020-09-28 (×5): qty 1

## 2020-09-28 MED ORDER — KCL IN DEXTROSE-NACL 20-5-0.45 MEQ/L-%-% IV SOLN
INTRAVENOUS | Status: DC
  Filled 2020-09-28 (×8): qty 1000

## 2020-09-28 MED ORDER — PANTOPRAZOLE SODIUM 40 MG IV SOLR
40.0000 mg | Freq: Every day | INTRAVENOUS | Status: DC
  Administered 2020-09-28 – 2020-09-30 (×3): 40 mg via INTRAVENOUS
  Filled 2020-09-28 (×3): qty 40

## 2020-09-28 MED ORDER — SODIUM CHLORIDE 0.9% FLUSH
10.0000 mL | INTRAVENOUS | Status: DC | PRN
  Administered 2020-10-01: 10 mL

## 2020-09-28 MED ORDER — SODIUM CHLORIDE 0.9 % IV BOLUS
1000.0000 mL | Freq: Once | INTRAVENOUS | Status: AC
  Administered 2020-09-28: 1000 mL via INTRAVENOUS

## 2020-09-28 MED ORDER — FENTANYL CITRATE (PF) 100 MCG/2ML IJ SOLN
50.0000 ug | Freq: Once | INTRAMUSCULAR | Status: AC
  Administered 2020-09-28: 50 ug via INTRAVENOUS
  Filled 2020-09-28: qty 2

## 2020-09-28 MED ORDER — DIPHENHYDRAMINE HCL 50 MG/ML IJ SOLN: 25.0000 mg | Freq: Four times a day (QID) | INTRAMUSCULAR | Status: DC | PRN

## 2020-09-28 MED ORDER — IOHEXOL 300 MG/ML  SOLN
100.0000 mL | Freq: Once | INTRAMUSCULAR | Status: AC | PRN
  Administered 2020-09-28: 100 mL via INTRAVENOUS

## 2020-09-28 MED ORDER — METOPROLOL TARTRATE 5 MG/5ML IV SOLN: 5.0000 mg | Freq: Four times a day (QID) | INTRAVENOUS | Status: DC | PRN

## 2020-09-28 MED ORDER — ONDANSETRON 4 MG PO TBDP
4.0000 mg | ORAL_TABLET | Freq: Once | ORAL | Status: AC
  Administered 2020-09-28: 4 mg via ORAL
  Filled 2020-09-28: qty 1

## 2020-09-28 MED ORDER — ONDANSETRON 4 MG PO TBDP: 4.0000 mg | ORAL_TABLET | Freq: Four times a day (QID) | ORAL | Status: DC | PRN

## 2020-09-28 MED ORDER — DIPHENHYDRAMINE HCL 25 MG PO CAPS: 25.0000 mg | ORAL_CAPSULE | Freq: Four times a day (QID) | ORAL | Status: DC | PRN

## 2020-09-28 MED ORDER — HEPARIN (PORCINE) 25000 UT/250ML-% IV SOLN
1350.0000 [IU]/h | INTRAVENOUS | Status: DC
  Administered 2020-09-28: 1100 [IU]/h via INTRAVENOUS
  Administered 2020-09-29 – 2020-09-30 (×2): 1350 [IU]/h via INTRAVENOUS
  Filled 2020-09-28 (×5): qty 250

## 2020-09-28 MED ORDER — ONDANSETRON HCL 4 MG/2ML IJ SOLN
4.0000 mg | Freq: Four times a day (QID) | INTRAMUSCULAR | Status: DC | PRN
  Administered 2020-09-28: 4 mg via INTRAVENOUS
  Filled 2020-09-28: qty 2

## 2020-09-28 NOTE — ED Notes (Signed)
Unable to collect blood specimens at triage multiple times .

## 2020-09-28 NOTE — Plan of Care (Signed)
  Problem: Clinical Measurements: Goal: Ability to maintain clinical measurements within normal limits will improve Outcome: Progressing Goal: Will remain free from infection Outcome: Progressing Goal: Diagnostic test results will improve Outcome: Progressing Goal: Cardiovascular complication will be avoided Outcome: Progressing   

## 2020-09-28 NOTE — ED Triage Notes (Signed)
Patient arrived with EMS from home reports generalized abdominal pain with emesis onset yesterday , no diarrhea or fever .

## 2020-09-28 NOTE — ED Provider Notes (Signed)
[facility] EMERGENCY DEPARTMENT Provider Note   CSN: [id number] Arrival date & time: 09/28/20  0442     History No chief complaint on file.   Yesenia Wheeler is a 50 y.o. female history of fibroids, migraines, obesity, asthma, cholecystectomy, gastric bypass.  Patient had via EMS today for evaluation of abdominal pain nausea and vomiting that began yesterday.  She describes generalized abdominal pain constant aching nonradiating worsened with vomiting minimally improved with rest and moderate intensity.  She reports multiple episodes of nonbloody/nonbilious emesis, denies any history of diarrhea.  Patient is concerned pain may be secondary to her fibroid she reports she started menstrual cycle 2 days ago.  Denies fever/chills, chest pain/shortness of breath, fall/injury, dysuria/hematuria, concern for STI, diarrhea, melena, hematochezia or any additional concerns    HPI     Past Medical History:  Diagnosis Date  . Acute meniscal tear of left knee   . Anemia   . 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 Migraines   . Mild asthma   . OA (osteoarthritis)    bilateral knees    Patient Active Problem List   Diagnosis Date Noted  . Left breast mass 10/23/2017  . Acute medial meniscus tear of left knee 08/30/2017  . BV (bacterial vaginosis) 06/23/2015  . Tinea pedis 06/21/2015  . Screening-pulmonary TB 03/15/2015  . Obesity (BMI 30-39.9) 05/26/2014  . Asthma, chronic 05/25/2014  . Migraine 05/25/2014  . Chronic back pain 05/25/2014    Past Surgical History:  Procedure Laterality Date  . BREAST LUMPECTOMY WITH RADIOACTIVE SEED LOCALIZATION Left 10/23/2017   Procedure: BREAST LUMPECTOMY WITH RADIOACTIVE SEED LOCALIZATION;  Surgeon: Erroll Luna, MD;   Location: [facility];  Service: General;  Laterality: Left;  . BREAST REDUCTION SURGERY Bilateral 10/23/2017   Procedure: LEFT BREAST ONCOPLASTIC REDUCTION, RIGHT BREAST REDUCTION;  Surgeon: Irene Limbo, MD;  Location: [facility];  Service: Plastics;  Laterality: Bilateral;  . CESAREAN SECTION  2011  . GASTRIC BYPASS  2001  . KNEE ARTHROSCOPY W/ MENISCECTOMY Right 2007  . KNEE ARTHROSCOPY WITH MEDIAL MENISECTOMY Left 08/30/2017   Procedure: KNEE ARTHROSCOPY WITH PARTIAL MEDIAL MENISECTOMY;  Surgeon: Nicholes Stairs, MD;  Location: [facility];  Service: Orthopedics;  Laterality: Left;  60 mins  . LAPAROSCOPIC CHOLECYSTECTOMY  2001  . SHOULDER ARTHROSCOPY Left 2008   spur     OB History    Gravida  38   Para      Term      Preterm      AB  14   Living  3     SAB  13   IAB      Ectopic      Multiple      Live Births              Family History  Problem Relation Age of Onset  . Cancer Father        oral    Social History   Tobacco Use  . Smoking status: Former Smoker    Years: 6.00    Types: Cigarettes    Quit date: 06/23/2017    Years since quitting: 3.2  . Smokeless tobacco: Never Used  . Tobacco comment: 2ppwk  Vaping Use  . Vaping Use: Former  Substance Use Topics  . Alcohol use: Yes    Comment: occasional  . Drug use: No    Home Medications Prior to Admission medications   Medication Sig Start Date End Date Taking? Authorizing Provider  acetaminophen (TYLENOL) 500 MG tablet Take 1 tablet (500 mg total) by mouth every 6 (six) hours as needed. 08/23/19   Domenic Moras, PA-C  albuterol [facility] HFA) [phone] Base) MCG/ACT inhaler Inhale into the lungs every 6 (six) hours as needed for wheezing or shortness of breath.    [provider]  Apixaban (ELIQUIS PO) Take 15 mg by mouth.    [provider]  ibuprofen (ADVIL) 600 MG tablet Take 1 tablet (600 mg total) by mouth every 6 (six)  hours as needed. Patient not taking: Reported on 05/11/2020 03/16/20   Raylene Everts, MD  enoxaparin (LOVENOX) 40 MG/0.4ML injection Inject 0.4 mLs (40 mg total) into the skin daily. Start 3.14.19 Patient not taking: Reported on 04/30/2018 10/24/17 03/16/20  Irene Limbo, MD    Allergies    Hydrocodone and Oxycodone  Review of Systems   Review of Systems Ten systems are reviewed and are negative for acute change except as noted in the HPI  Physical Exam Updated Vital Signs BP (!) 146/88   Pulse 70   Temp 98.9 F (37.2 C) (Oral)   Resp 17   SpO2 100%   Physical Exam Constitutional:      General: She is not in acute distress.    Appearance: Normal appearance. She is well-developed. She is not ill-appearing or diaphoretic.  HENT:     Head: Normocephalic and atraumatic.  Eyes:     General: Vision grossly intact. Gaze aligned appropriately.     Pupils: Pupils are equal, round, and reactive to light.  Neck:     Trachea: Trachea and phonation normal.  Pulmonary:     Effort: Pulmonary effort is normal. No respiratory distress.  Abdominal:     General: There is no distension.     Palpations: Abdomen is soft.     Tenderness: There is generalized abdominal tenderness. There is no guarding or rebound.  Musculoskeletal:        General: Normal range of motion.     Cervical back: Normal range of motion.  Skin:    General: Skin is warm and dry.  Neurological:     Mental Status: She is alert.     GCS: GCS eye subscore is 4. GCS verbal subscore is 5. GCS motor subscore is 6.     Comments: Speech is clear and goal oriented, follows commands Major Cranial nerves without deficit, no facial droop Moves extremities without ataxia, coordination intact  Psychiatric:        Behavior: Behavior normal.     ED Results / Procedures / Treatments   Labs (all labs ordered are listed, but only abnormal results are displayed) Labs Reviewed  COMPREHENSIVE METABOLIC PANEL - Abnormal; Notable  for the following components:      Result Value   CO2 21 (*)    Calcium 8.6 (*)    All other components within normal limits  CBC - Abnormal; Notable for the following components:   Hemoglobin 9.7 (*)    HCT 30.3 (*)    MCV 75.4 (*)    MCH 24.1 (*)    RDW 17.8 (*)    All other components within normal limits  RESP PANEL BY RT-PCR (FLU A&B, COVID) ARPGX2  LIPASE, BLOOD  URINALYSIS, ROUTINE W REFLEX MICROSCOPIC  I-STAT BETA HCG BLOOD, ED ([facility], [facility], [facility] ONLY)    EKG None  Radiology CT ABDOMEN PELVIS W CONTRAST  Result Date: 09/28/2020 CLINICAL DATA:  Abdominal pain. EXAM: CT ABDOMEN AND PELVIS WITH CONTRAST TECHNIQUE: Multidetector CT imaging of the abdomen and pelvis was performed using the standard protocol following bolus administration of intravenous contrast. CONTRAST:  169mL OMNIPAQUE IOHEXOL 300 MG/ML  SOLN COMPARISON:  None. FINDINGS: Lower chest: Insert lung bases Hepatobiliary: No hepatic lesions or intrahepatic biliary dilatation. The gallbladder is surgically absent. No common bile duct dilatation. Pancreas: No mass, inflammation or ductal dilatation. Spleen: Normal size.  No focal lesions. Adrenals/Urinary Tract: Adrenal glands and kidneys are unremarkable. The bladder is normal. Stomach/Bowel: Surgical changes from gastric bypass surgery. The stomach is unremarkable. There are dilated small bowel loops with air-fluid levels consistent with a small-bowel obstruction. This appears to be due to adhesions in the left abdomen near the distal anastomosis. The mid distal small bowel loops are normal in caliber/decompressed. The colon is unremarkable. It is largely decompressed. The appendix is normal. The terminal ileum is normal. Vascular/Lymphatic: The aorta is normal in caliber. No dissection. The branch vessels are patent. The major venous structures are patent. No mesenteric or retroperitoneal mass or adenopathy. Small scattered lymph nodes are noted. Reproductive: Mildly enlarged  fibroid uterus. Other: No pelvic mass or adenopathy. No free pelvic fluid collections. No inguinal mass or adenopathy. No abdominal wall hernia or subcutaneous lesions. Musculoskeletal: No significant bony findings. IMPRESSION: 1. CT findings consistent with a small-bowel obstruction likely due to adhesions in the left abdomen near the duodenal jejunal anastomosis. 2. Surgical changes from gastric bypass surgery. 3. Status post cholecystectomy. No biliary dilatation. 4. Mildly enlarged fibroid uterus. Electronically Signed   By: Marijo Sanes M.D.   On: 09/28/2020 14:37    Procedures Procedures   Medications Ordered in ED Medications  sodium chloride flush (NS) 0.9 % injection 10-40 mL (has no administration in time range)  sodium chloride flush (NS) 0.9 % injection 10-40 mL (has no administration in time range)  fentaNYL (SUBLIMAZE) injection 50 mcg (has no administration in time range)  ondansetron (ZOFRAN-ODT) disintegrating tablet 4 mg (4 mg Oral Given 09/28/20 0501)  sodium chloride 0.9 % bolus 1,000 mL (1,000 mLs Intravenous New Bag/Given 09/28/20 1256)  fentaNYL (SUBLIMAZE) injection 50 mcg (50 mcg Intravenous Given 09/28/20 1256)  iohexol (OMNIPAQUE) 300 MG/ML solution 100 mL (100 mLs Intravenous Contrast Given 09/28/20 1407)    ED Course  I have reviewed the triage vital signs and the nursing notes.  Pertinent labs & imaging results that were available during my care of the patient were reviewed by me and considered in my medical decision making (see chart for details).  Clinical Course as of 09/28/20 1505  Tue Sep 28, 2020  1500 Kathlee Nations [BM]    Clinical Course User Index [BM] Gari Crown   MDM Rules/Calculators/A&P                         Additional history obtained from: 1. Nursing notes from this visit. 2. Review of electronic medical records ----------------------- I ordered, reviewed and interpreted labs which include: Pregnancy test negative. CBC without  leukocytosis to suggest infectious process, anemia of 9.7 appears baseline.   Lipase within normal limits, doubt pancreatitis. CMP shows no emergent lecture light derangement, AKI, LFT elevations or gap. Urinalysis pending. Covid test pending.  CTAP:  IMPRESSION:  1. CT findings consistent with a small-bowel obstruction likely due  to adhesions in the left abdomen near the duodenal jejunal  anastomosis.  2. Surgical changes from gastric bypass surgery.  3. Status post cholecystectomy. No biliary dilatation.  4. Mildly enlarged fibroid uterus.  - Patient reassessed, reports temporary improvement following fentanyl, additional dose ordered.  No additional nausea since receiving Zofran earlier.  Patient updated on CT scan findings by Dr. Gilford Raid she states understanding.  I spoke with general surgery team, Kathlee Nations at 3 PM and they are coming down to assess the patient.  Possible admission to general surgery services they will advise after they evaluate.  NG tube ordered.  Care handoff given to [facility] PA-C at shift change, plan of care is to follow-up on general surgery recommendations whether they want to admit directly to their service or 1 medicine admission.    Note: Portions of this report may have been transcribed using voice recognition software. Every effort was made to ensure accuracy; however, inadvertent computerized transcription errors may still be present. Final Clinical Impression(s) / ED Diagnoses Final diagnoses:  SBO (small bowel obstruction) [facility])    Rx / DC Orders ED Discharge Orders    None       Gari Crown 09/28/20 1525    Isla Pence, MD 09/28/20 1556

## 2020-09-28 NOTE — Progress Notes (Signed)
ANTICOAGULATION CONSULT NOTE - Initial Consult  Pharmacy Consult for Heparin Indication: recurrent DVT  Allergies  Allergen Reactions  . Hydrocodone   . Oxycodone Other (See Comments)    Chest pain    Patient Measurements: Height: 5\' 4"  (162.6 cm) Weight: 99.8 kg (220 lb) IBW/kg (Calculated) : 54.7 Heparin Dosing Weight: 77.8 kg  Vital Signs: Temp: 98.9 F (37.2 C) (02/15 0824) Temp Source: Oral (02/15 0824) BP: 135/74 (02/15 1515) Pulse Rate: 72 (02/15 1515)  Labs: Recent Labs    09/28/20 0451  HGB 9.7*  HCT 30.3*  PLT 298  CREATININE 0.92    Estimated Creatinine Clearance: 85.8 mL/min (by C-G formula based on SCr of 0.92 mg/dL).   Medical History: Past Medical History:  Diagnosis Date  . Acute meniscal tear of left knee   . Anemia   . 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 Migraines   . Mild asthma   . OA (osteoarthritis)    bilateral knees    Assessment: 50 yo F presents with SBO. Has history of recurrent DVT and was on Eliquis PTA. Pharmacy asked to switch Eliquis to Heparin. Last dose Eliquis PTA on 2/13. Hgb lower 9.7 but appears to be at baseline, pltc 298. No overt bleeding noted. No surgery planned for now.  Goal of Therapy:  Heparin level 0.3-0.7 units/ml Monitor platelets by anticoagulation protocol: Yes   Plan:  Start heparin infusion at 1100 units/hr Check baseline and 6-hr HL and aPTT Monitor daily HL, aPTT, CBC, s/sx bleeding  Richardine Service, PharmD, BCPS PGY2 Cardiology Pharmacy Resident Phone: [phone] 09/28/2020  5:11 PM  Please check [email or web address] for unit-specific pharmacy phone numbers.

## 2020-09-28 NOTE — ED Notes (Signed)
This RN unsuccessful at IV access, IV team consult placed.

## 2020-09-28 NOTE — ED Notes (Signed)
IV team at bedside 

## 2020-09-28 NOTE — H&P (Signed)
Yesenia Wheeler Nov 30, 1970  [id number].    Requesting MD: Gilford Raid, MD Chief Complaint/Reason for Consult: SBO  HPI:  Ms. 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 Pain described as sharp, stabbing epigastric pain that is non-radiating. Denies alleviating factors. Has been unable to tolerate PO, even liquids, for about two days. States she thought her pain may be due to her fibroids.    Denies fever, chills, sick contacts, hematemesis, melena, hematochezia, or urinary sxs. Pt is currently on her period which she states is normal for her (painful and heavy due to 13 uterine fibroids). Pt reports smoking multiple cigars (black & mild) daily. Denies narcotic or IVDU. States she does not tolerate oxycodone. She reports last dose of eliquis as 3-4 days ago.   Past abdominal surgeries: gastric bypass in 2001 in [place], cholecystectomy, cesarean section   ROS: Above Review of Systems  All other systems reviewed and are negative.   Family History  Problem Relation Age of Onset  . Cancer Father        oral    Past Medical History:  Diagnosis Date  . Acute meniscal tear of left knee   . Anemia   . 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 Migraines   . Mild asthma   . OA (osteoarthritis)    bilateral knees    Past Surgical History:  Procedure Laterality Date  . BREAST LUMPECTOMY WITH RADIOACTIVE SEED LOCALIZATION Left 10/23/2017   Procedure: BREAST LUMPECTOMY WITH RADIOACTIVE SEED LOCALIZATION;  Surgeon: Erroll Luna, MD;  Location:  [facility];  Service: General;  Laterality: Left;  . BREAST REDUCTION SURGERY Bilateral 10/23/2017   Procedure: LEFT BREAST ONCOPLASTIC REDUCTION, RIGHT BREAST REDUCTION;  Surgeon: Irene Limbo, MD;  Location: [facility];  Service: Plastics;  Laterality: Bilateral;  . CESAREAN SECTION  2011  . GASTRIC BYPASS  2001  . KNEE ARTHROSCOPY W/ MENISCECTOMY Right 2007  . KNEE ARTHROSCOPY WITH MEDIAL MENISECTOMY Left 08/30/2017   Procedure: KNEE ARTHROSCOPY WITH PARTIAL MEDIAL MENISECTOMY;  Surgeon: Nicholes Stairs, MD;  Location: [facility];  Service: Orthopedics;  Laterality: Left;  60 mins  . LAPAROSCOPIC CHOLECYSTECTOMY  2001  . SHOULDER ARTHROSCOPY Left 2008   spur    Social History:  reports that she quit smoking about 3 years ago. Her smoking use included cigarettes. She quit after 6.00 years of use. She has never used smokeless tobacco. She reports current alcohol use. She reports that she does not use drugs.  Allergies:  Allergies  Allergen Reactions  . Hydrocodone   . Oxycodone Other (See Comments)    Chest pain    (Not in a hospital admission)   Blood pressure 130/79, pulse 73, temperature 98.9 F (37.2 C), temperature source Oral, resp. rate 16, SpO2 100 %. Physical Exam: Constitutional: NAD; conversant; no deformities Eyes: Moist conjunctiva; no lid lag; anicteric; PERRL Neck: Trachea midline; no thyromegaly Lungs: Normal respiratory effort; no tactile fremitus CV: RRR; no palpable thrills; no pitting edema  GI: Abd soft, mild distention, epigastric fullness present, TTP epigastrium and LUQ without guarding or peritonitis, no palpable hepatosplenomegaly MSK: symmetrical, no clubbing/cyanosis Psychiatric: Appropriate affect; alert and oriented x3 Lymphatic: No palpable cervical or axillary lymphadenopathy  Results for orders placed or performed during the hospital encounter of 09/28/20 (from the past 48 hour(s))  Lipase,  blood     Status: None   Collection Time: 09/28/20  4:51 AM  Result Value Ref Range   Lipase 36 11 - 51 U/L    Comment: Performed at [facility], [place] [address] [place], [place] [address]  Comprehensive metabolic panel     Status: Abnormal   Collection Time: 09/28/20  4:51 AM  Result Value Ref Range   Sodium 140 135 - 145 mmol/L   Potassium 4.1 3.5 - 5.1 mmol/L   Chloride 110 98 - 111 mmol/L   CO2 21 (L) 22 - 32 mmol/L   Glucose, Bld 97 70 - 99 mg/dL    Comment: Glucose reference range applies only to samples taken after fasting for at least 8 hours.   BUN 10 6 - 20 mg/dL   Creatinine, Ser 0.92 0.44 - 1.00 mg/dL   Calcium 8.6 (L) 8.9 - 10.3 mg/dL   Total Protein 6.9 6.5 - 8.1 g/dL   Albumin 3.5 3.5 - 5.0 g/dL   AST 25 15 - 41 U/L   ALT 13 0 - 44 U/L   Alkaline Phosphatase 112 38 - 126 U/L   Total Bilirubin 0.6 0.3 - 1.2 mg/dL   GFR, Estimated >60 >60 mL/min    Comment: (NOTE) Calculated using the CKD-EPI Creatinine Equation (2021)    Anion gap 9 5 - 15    Comment: Performed at [facility] [address], [place], [place] [address]  CBC     Status: Abnormal   Collection Time: 09/28/20  4:51 AM  Result Value Ref Range   WBC 7.8 4.0 - 10.5 K/uL   RBC 4.02 3.87 - 5.11 MIL/uL   Hemoglobin 9.7 (L) 12.0 - 15.0 g/dL   HCT 30.3 (L) 36.0 - 46.0 %   MCV 75.4 (L) 80.0 - 100.0 fL   MCH 24.1 (L) 26.0 - 34.0 pg   MCHC 32.0 30.0 - 36.0 g/dL   RDW 17.8 (H) 11.5 - 15.5 %   Platelets 298 150 - 400 K/uL   nRBC 0.0 0.0 - 0.2 %    Comment: Performed at [facility], [place] [address], [place], [place] [address]  I-Stat beta hCG blood, ED     Status: None   Collection Time: 09/28/20 12:49 PM  Result Value Ref Range   I-stat hCG, quantitative <5.0 <5 mIU/mL   Comment 3            Comment:   GEST. AGE      CONC.  (mIU/mL)   <=1 WEEK        5 - 50     2 WEEKS       50 - 500     3 WEEKS       100 - 10,000     4 WEEKS     1,000 - 30,000        FEMALE AND NON-PREGNANT  FEMALE:     LESS THAN 5 mIU/mL    CT ABDOMEN PELVIS W CONTRAST  Result Date: 09/28/2020 CLINICAL DATA:  Abdominal pain. EXAM: CT ABDOMEN AND PELVIS WITH CONTRAST TECHNIQUE: Multidetector CT imaging of the abdomen  and pelvis was performed using the standard protocol following bolus administration of intravenous contrast. CONTRAST:  167mL OMNIPAQUE IOHEXOL 300 MG/ML  SOLN COMPARISON:  None. FINDINGS: Lower chest: Insert lung bases Hepatobiliary: No hepatic lesions or intrahepatic biliary dilatation. The gallbladder is surgically absent. No common bile duct dilatation. Pancreas: No mass, inflammation or ductal dilatation. Spleen: Normal size.  No focal lesions. Adrenals/Urinary Tract: Adrenal glands and kidneys are unremarkable. The bladder is normal. Stomach/Bowel: Surgical changes from gastric bypass surgery. The stomach is unremarkable. There are dilated small bowel loops with air-fluid levels consistent with a small-bowel obstruction. This appears to be due to adhesions in the left abdomen near the distal anastomosis. The mid distal small bowel loops are normal in caliber/decompressed. The colon is unremarkable. It is largely decompressed. The appendix is normal. The terminal ileum is normal. Vascular/Lymphatic: The aorta is normal in caliber. No dissection. The branch vessels are patent. The major venous structures are patent. No mesenteric or retroperitoneal mass or adenopathy. Small scattered lymph nodes are noted. Reproductive: Mildly enlarged fibroid uterus. Other: No pelvic mass or adenopathy. No free pelvic fluid collections. No inguinal mass or adenopathy. No abdominal wall hernia or subcutaneous lesions. Musculoskeletal: No significant bony findings. IMPRESSION: 1. CT findings consistent with a small-bowel obstruction likely due to adhesions in the left abdomen near the duodenal jejunal anastomosis. 2. Surgical changes from gastric bypass surgery. 3. Status post cholecystectomy. No biliary  dilatation. 4. Mildly enlarged fibroid uterus. Electronically Signed   By: Marijo Sanes M.D.   On: 09/28/2020 14:37   Assessment/Plan GERD Recurrent DVTs on Eliquis - hold Eliquis, start hep gtt  PMH gastric bypass 2001 in [place]   SBO  - afebrile, VSS, WBC WNL  - place NG tube and perform small bowel protocol - NPO, IVF - PRN analgesics and antiemetics  - No acute surgical needs. Hopefully will resolve with bowel rest and decompression. Failure to improve with non-operative measures in the next few days may warrant Dx laparoscopy vs exploratory laparotomy.  Jill Alexanders, PA-C [facility] Please see Amion for pager number during day hours 7:00am-4:30pm 09/28/2020, 3:13 PM

## 2020-09-28 NOTE — ED Notes (Signed)
Pt transported to CT ?

## 2020-09-28 NOTE — Progress Notes (Signed)
Received pt from ED. Ambulated to the restroom, place pt in chair with call button and phone in reach. Orient pt the floor. Pt is resting at this time

## 2020-09-28 NOTE — ED Notes (Signed)
This nurse consulted second RN; Missy; to attempt IV access without success.

## 2020-09-28 NOTE — ED Notes (Signed)
IV team unable to obtain access at this time, this nurse notified ED provider [facility].

## 2020-09-29 ENCOUNTER — Inpatient Hospital Stay (HOSPITAL_COMMUNITY): Payer: BC Managed Care – PPO

## 2020-09-29 ENCOUNTER — Inpatient Hospital Stay: Payer: Self-pay

## 2020-09-29 LAB — CBC
HCT: 31.1 % — ABNORMAL LOW (ref 36.0–46.0)
Hemoglobin: 10 g/dL — ABNORMAL LOW (ref 12.0–15.0)
MCH: 23.9 pg — ABNORMAL LOW (ref 26.0–34.0)
MCHC: 32.2 g/dL (ref 30.0–36.0)
MCV: 74.4 fL — ABNORMAL LOW (ref 80.0–100.0)
Platelets: 309 10*3/uL (ref 150–400)
RBC: 4.18 MIL/uL (ref 3.87–5.11)
RDW: 17.8 % — ABNORMAL HIGH (ref 11.5–15.5)
WBC: 9.3 10*3/uL (ref 4.0–10.5)
nRBC: 0 % (ref 0.0–0.2)

## 2020-09-29 LAB — HEPARIN LEVEL (UNFRACTIONATED)
Heparin Unfractionated: <0.1 IU/mL — ABNORMAL LOW (ref 0.30–0.70)
Heparin Unfractionated: 0.48 IU/mL (ref 0.30–0.70)

## 2020-09-29 LAB — BASIC METABOLIC PANEL
Anion gap: 7 (ref 5–15)
BUN: 8 mg/dL (ref 6–20)
CO2: 20 mmol/L — ABNORMAL LOW (ref 22–32)
Calcium: 8.4 mg/dL — ABNORMAL LOW (ref 8.9–10.3)
Chloride: 111 mmol/L (ref 98–111)
Creatinine, Ser: 0.81 mg/dL (ref 0.44–1.00)
GFR, Estimated: >60 mL/min (ref >60)
Glucose, Bld: 143 mg/dL — ABNORMAL HIGH (ref 70–99)
Potassium: 4.1 mmol/L (ref 3.5–5.1)
Sodium: 138 mmol/L (ref 135–145)

## 2020-09-29 LAB — HIV ANTIBODY (ROUTINE TESTING W REFLEX): HIV Screen 4th Generation wRfx: NONREACTIVE

## 2020-09-29 MED ORDER — CHLORHEXIDINE GLUCONATE CLOTH 2 % EX PADS
6.0000 | MEDICATED_PAD | Freq: Every day | CUTANEOUS | Status: DC
  Administered 2020-09-29 – 2020-10-01 (×3): 6 via TOPICAL

## 2020-09-29 MED ORDER — SODIUM CHLORIDE 0.9% FLUSH: 10.0000 mL | INTRAVENOUS | Status: DC | PRN

## 2020-09-29 NOTE — Progress Notes (Signed)
[facility] for Heparin Indication: recurrent DVT  Allergies  Allergen Reactions  . Hydrocodone   . Oxycodone Other (See Comments)    Chest pain    Patient Measurements: Height: 5\' 4"  (162.6 cm) Weight: 99.8 kg (220 lb) IBW/kg (Calculated) : 54.7 Heparin Dosing Weight: 77.8 kg  Vital Signs: Temp: 98.1 F (36.7 C) (02/16 0057) Temp Source: Oral (02/15 2104) BP: 148/84 (02/16 0057) Pulse Rate: 85 (02/16 0057)  Labs: Recent Labs    09/28/20 0451 09/28/20 2249 09/29/20 0332  HGB 9.7*  --  10.0*  HCT 30.3*  --  31.1*  PLT 298  --  309  APTT  --  40*  --   HEPARINUNFRC  --  0.16* <0.10*  CREATININE 0.92  --  0.81    Estimated Creatinine Clearance: 97.5 mL/min (by C-G formula based on SCr of 0.81 mg/dL).   Medical History: Past Medical History:  Diagnosis Date  . Acute meniscal tear of left knee   . Anemia   . 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 Migraines   . Mild asthma   . OA (osteoarthritis)    bilateral knees    Assessment: 50 yo F presents with SBO. Has history of recurrent DVT and was on Eliquis PTA. Pharmacy asked to switch Eliquis to Heparin. Last dose Eliquis PTA on 2/13. Hgb lower 9.7 but appears to be at baseline, pltc 298. No overt bleeding noted. No surgery planned for now.  2/16 AM update:  Heparin level low Hgb low but stable  Goal of Therapy:  Heparin level 0.3-0.7 units/ml Monitor platelets by anticoagulation protocol: Yes   Plan:  Inc heparin to 1350 units/hr 1300 heparin level  Narda Bonds, PharmD, BCPS Clinical Pharmacist Phone: [phone]

## 2020-09-29 NOTE — Progress Notes (Signed)
Peripherally Inserted Central Catheter Placement  The IV Nurse has discussed with the patient and/or persons authorized to consent for the patient, the purpose of this procedure and the potential benefits and risks involved with this procedure.  The benefits include less needle sticks, lab draws from the catheter, and the patient may be discharged home with the catheter. Risks include, but not limited to, infection, bleeding, blood clot (thrombus formation), and puncture of an artery; nerve damage and irregular heartbeat and possibility to perform a PICC exchange if needed/ordered by physician.  Alternatives to this procedure were also discussed.  Bard Power PICC patient education guide, fact sheet on infection prevention and patient information card has been provided to patient /or left at bedside.    PICC Placement Documentation  PICC Double Lumen 09/29/20 PICC Left 40 cm 1 cm (Active)  Indication for Insertion or Continuance of Line Poor Vasculature-patient has had multiple peripheral attempts or PIVs lasting less than 24 hours 09/29/20 1805  Exposed Catheter (cm) 1 cm 09/29/20 1805  Site Assessment Clean;Dry;Intact 09/29/20 1805  Lumen #2 Status Blood return noted 09/29/20 1805  Dressing Type Transparent 09/29/20 1805  Dressing Status Clean;Dry;Intact 09/29/20 1805  Antimicrobial disc in place? Yes 09/29/20 1805  Dressing Change Due 10/06/20 09/29/20 1805       Scotty Court 09/29/2020, 6:09 PM

## 2020-09-29 NOTE — Plan of Care (Signed)
  Problem: Activity: Goal: Risk for activity intolerance will decrease Outcome: Progressing   

## 2020-09-29 NOTE — Progress Notes (Signed)
[facility] Progress Note     Subjective: CC:  Reports feeling about the same as yesterday - ongoing upper abdominal discomfort that is improved temporarily with IV pain meds. Denies flatus or BM.   Objective: Vital signs in last 24 hours: Temp:  [98 F (36.7 C)-98.6 F (37 C)] 98 F (36.7 C) (02/16 0625) Pulse Rate:  [63-85] 83 (02/16 0625) Resp:  [14-20] 17 (02/16 0625) BP: (126-166)/(74-94) 138/87 (02/16 0625) SpO2:  [98 %-100 %] 100 % (02/16 0625) Weight:  [99.8 kg] 99.8 kg (02/15 1600) Last BM Date: 09/25/20  Intake/Output from previous day: 02/15 0701 - 02/16 0700 In: 1914.4 [I.V.:914.4; IV Piggyback:1000] Out: 450 [Emesis/NG output:450] Intake/Output this shift: No intake/output data recorded.  PE: Gen:  Alert, NAD, pleasant Card:  Regular rate and rhythm, pedal pulses 2+ BL Pulm:  Normal effort, clear to auscultation bilaterally Abd: Soft, TTP epigastrium and RUQ without guarding or peritonitis, mild abdominal distention, +BS, no HSM Skin: warm and dry, no rashes  Psych: A&Ox3   Lab Results:  Recent Labs    09/28/20 0451 09/29/20 0332  WBC 7.8 9.3  HGB 9.7* 10.0*  HCT 30.3* 31.1*  PLT 298 309   BMET Recent Labs    09/28/20 0451 09/29/20 0332  NA 140 138  K 4.1 4.1  CL 110 111  CO2 21* 20*  GLUCOSE 97 143*  BUN 10 8  CREATININE 0.92 0.81  CALCIUM 8.6* 8.4*   PT/INR No results for input(s): LABPROT, INR in the last 72 hours. CMP     Component Value Date/Time   NA 138 09/29/2020 0332   K 4.1 09/29/2020 0332   CL 111 09/29/2020 0332   CO2 20 (L) 09/29/2020 0332   GLUCOSE 143 (H) 09/29/2020 0332   BUN 8 09/29/2020 0332   CREATININE 0.81 09/29/2020 0332   CREATININE 1.02 03/15/2015 1554   CALCIUM 8.4 (L) 09/29/2020 0332   PROT 6.9 09/28/2020 0451   ALBUMIN 3.5 09/28/2020 0451   AST 25 09/28/2020 0451   ALT 13 09/28/2020 0451   ALKPHOS 112 09/28/2020 0451   BILITOT 0.6 09/28/2020 0451   GFRNONAA >60 09/29/2020 0332   GFRAA  >60 07/02/2019 0953   Lipase     Component Value Date/Time   LIPASE 36 09/28/2020 0451       Studies/Results: CT ABDOMEN PELVIS W CONTRAST  Result Date: 09/28/2020 CLINICAL DATA:  Abdominal pain. EXAM: CT ABDOMEN AND PELVIS WITH CONTRAST TECHNIQUE: Multidetector CT imaging of the abdomen and pelvis was performed using the standard protocol following bolus administration of intravenous contrast. CONTRAST:  17mL OMNIPAQUE IOHEXOL 300 MG/ML  SOLN COMPARISON:  None. FINDINGS: Lower chest: Insert lung bases Hepatobiliary: No hepatic lesions or intrahepatic biliary dilatation. The gallbladder is surgically absent. No common bile duct dilatation. Pancreas: No mass, inflammation or ductal dilatation. Spleen: Normal size.  No focal lesions. Adrenals/Urinary Tract: Adrenal glands and kidneys are unremarkable. The bladder is normal. Stomach/Bowel: Surgical changes from gastric bypass surgery. The stomach is unremarkable. There are dilated small bowel loops with air-fluid levels consistent with a small-bowel obstruction. This appears to be due to adhesions in the left abdomen near the distal anastomosis. The mid distal small bowel loops are normal in caliber/decompressed. The colon is unremarkable. It is largely decompressed. The appendix is normal. The terminal ileum is normal. Vascular/Lymphatic: The aorta is normal in caliber. No dissection. The branch vessels are patent. The major venous structures are patent. No mesenteric or retroperitoneal mass or adenopathy. Small scattered  lymph nodes are noted. Reproductive: Mildly enlarged fibroid uterus. Other: No pelvic mass or adenopathy. No free pelvic fluid collections. No inguinal mass or adenopathy. No abdominal wall hernia or subcutaneous lesions. Musculoskeletal: No significant bony findings. IMPRESSION: 1. CT findings consistent with a small-bowel obstruction likely due to adhesions in the left abdomen near the duodenal jejunal anastomosis. 2. Surgical  changes from gastric bypass surgery. 3. Status post cholecystectomy. No biliary dilatation. 4. Mildly enlarged fibroid uterus. Electronically Signed   By: Marijo Sanes M.D.   On: 09/28/2020 14:37   DG Abd Portable 1V-Small Bowel Obstruction Protocol-initial, 8 hr delay  Result Date: 09/29/2020 CLINICAL DATA:  Small-bowel obstruction 8 hour delay EXAM: PORTABLE ABDOMEN - 1 VIEW COMPARISON:  09/28/2020 FINDINGS: There is dilated small bowel in the central abdomen. Some of the loops containing contrast material. There is contrast within the ascending colon. IMPRESSION: Dilated small bowel in the central abdomen. Some contrast material within the ascending colon. Electronically Signed   By: Ulyses Jarred M.D.   On: 09/29/2020 02:50   DG Abd Portable 1V-Small Bowel Protocol-Position Verification  Result Date: 09/28/2020 CLINICAL DATA:  Enteric catheter placement EXAM: PORTABLE ABDOMEN - 1 VIEW COMPARISON:  09/28/2020 FINDINGS: Frontal view of the lower chest and upper abdomen demonstrates enteric catheter tip and side port projecting over the gastric fundus. Distended gas-filled loops of bowel within the central upper abdomen consistent with small-bowel obstruction identified on preceding CT. Lung bases are clear. IMPRESSION: 1. Enteric catheter overlying the gastric fundus. Electronically Signed   By: Randa Ngo M.D.   On: 09/28/2020 16:58   [place] EKG SITE RITE  Result Date: 09/29/2020 If Site Rite image not attached, placement could not be confirmed due to current cardiac rhythm.   Anti-infectives: Anti-infectives (From admission, onward)   None     Assessment/Plan  GERD Recurrent DVTs on Eliquis - hold Eliquis, hep gtt  PMH gastric bypass 2001 in [place]   SBO  - afebrile, VSS, WBC WNL  -SB protocol performed 2/15 >> persistently dilated loops of small bowel ?contrast in ascending colon. - clinically no return of bowel function, continue NG to LIWS and follow up 24h delay KUB - NPO,  IVF - PRN analgesics and antiemetics  - No acute surgical needs. Hopefully will resolve with bowel rest and decompression. Failure to improve with non-operative measures in the next few days may warrant Dx laparoscopy vs exploratory laparotomy.  FEN: NPO, IVF, NG to LIWS ID: none VTE: SCD's, hep gtt Dispo: await return of bowel function, NGT, order PICC line for poor access with need for IVF, hep gtt, PRN IV meds  LOS: 1 day    Obie Dredge, [facility] Please see Amion for pager number during day hours 7:00am-4:30pm

## 2020-09-29 NOTE — Progress Notes (Signed)
[place] for Heparin Indication: recurrent DVT  Allergies  Allergen Reactions  . Hydrocodone   . Oxycodone Other (See Comments)    Chest pain    Patient Measurements: Height: 5\' 4"  (162.6 cm) Weight: 99.8 kg (220 lb) IBW/kg (Calculated) : 54.7 Heparin Dosing Weight: 77.8 kg  Vital Signs: Temp: 98 F (36.7 C) (02/16 0625) Temp Source: Oral (02/16 1500) BP: 141/87 (02/16 1500) Pulse Rate: 78 (02/16 1500)  Labs: Recent Labs    09/28/20 0451 09/28/20 2249 09/29/20 0332 09/29/20 1437  HGB 9.7*  --  10.0*  --   HCT 30.3*  --  31.1*  --   PLT 298  --  309  --   APTT  --  40*  --   --   HEPARINUNFRC  --  0.16* <0.10* 0.48  CREATININE 0.92  --  0.81  --     Estimated Creatinine Clearance: 97.5 mL/min (by C-G formula based on SCr of 0.81 mg/dL).   Assessment: 50 yo F presents with SBO. Has history of recurrent DVT and was on Eliquis PTA. Pharmacy asked to switch Eliquis to Heparin. Last dose Eliquis PTA on 2/13.  Heparin level is therapeutic; no bleeding reported.  Heparin is infusing through the pIV right below the midline.  RN paused heparin infusion and flushed line appropriately before drawing lab.  Goal of Therapy:  Heparin level 0.3-0.7 units/ml Monitor platelets by anticoagulation protocol: Yes   Plan:  Continue heparin infusion at 1350 units/hr Daily heparin level and CBC  Stephaniemarie Stoffel D. Mina Marble, PharmD, BCPS, [place] 09/29/2020, 3:15 PM

## 2020-09-30 ENCOUNTER — Inpatient Hospital Stay (HOSPITAL_COMMUNITY): Payer: BC Managed Care – PPO

## 2020-09-30 ENCOUNTER — Encounter (HOSPITAL_COMMUNITY): Payer: Self-pay

## 2020-09-30 LAB — BASIC METABOLIC PANEL
Anion gap: 7 (ref 5–15)
BUN: <5 mg/dL — ABNORMAL LOW (ref 6–20)
CO2: 21 mmol/L — ABNORMAL LOW (ref 22–32)
Calcium: 8.1 mg/dL — ABNORMAL LOW (ref 8.9–10.3)
Chloride: 110 mmol/L (ref 98–111)
Creatinine, Ser: 0.92 mg/dL (ref 0.44–1.00)
GFR, Estimated: >60 mL/min (ref >60)
Glucose, Bld: 84 mg/dL (ref 70–99)
Potassium: 3.8 mmol/L (ref 3.5–5.1)
Sodium: 138 mmol/L (ref 135–145)

## 2020-09-30 LAB — CBC
HCT: 26.2 % — ABNORMAL LOW (ref 36.0–46.0)
Hemoglobin: 8.6 g/dL — ABNORMAL LOW (ref 12.0–15.0)
MCH: 24.4 pg — ABNORMAL LOW (ref 26.0–34.0)
MCHC: 32.8 g/dL (ref 30.0–36.0)
MCV: 74.2 fL — ABNORMAL LOW (ref 80.0–100.0)
Platelets: 191 10*3/uL (ref 150–400)
RBC: 3.53 MIL/uL — ABNORMAL LOW (ref 3.87–5.11)
RDW: 17.6 % — ABNORMAL HIGH (ref 11.5–15.5)
WBC: 7.2 10*3/uL (ref 4.0–10.5)
nRBC: 0 % (ref 0.0–0.2)

## 2020-09-30 LAB — HEPARIN LEVEL (UNFRACTIONATED)
Heparin Unfractionated: 0.84 IU/mL — ABNORMAL HIGH (ref 0.30–0.70)
Heparin Unfractionated: 1.46 IU/mL — ABNORMAL HIGH (ref 0.30–0.70)
Heparin Unfractionated: 1.26 IU/mL — ABNORMAL HIGH (ref 0.30–0.70)
Heparin Unfractionated: 0.6 IU/mL (ref 0.30–0.70)

## 2020-09-30 MED ORDER — HEPARIN (PORCINE) 25000 UT/250ML-% IV SOLN
1050.0000 [IU]/h | INTRAVENOUS | Status: DC
  Administered 2020-09-30 – 2020-10-01 (×2): 1050 [IU]/h via INTRAVENOUS
  Filled 2020-09-30 (×2): qty 250

## 2020-09-30 MED ORDER — SIMETHICONE 80 MG PO CHEW
80.0000 mg | CHEWABLE_TABLET | Freq: Four times a day (QID) | ORAL | Status: DC | PRN
  Filled 2020-09-30: qty 1

## 2020-09-30 NOTE — Progress Notes (Signed)
NG tube removed, no issues, sore throat reported.

## 2020-09-30 NOTE — Progress Notes (Addendum)
[facility] Progress Note     Subjective: CC:  Abd pain improving. Denies nausea. +Flatus and BM. Mobilized yesterday.  Objective: Vital signs in last 24 hours: Temp:  [98.4 F (36.9 C)-98.6 F (37 C)] 98.4 F (36.9 C) (02/17 0434) Pulse Rate:  [73-78] 73 (02/17 0434) Resp:  [15-18] 17 (02/17 0434) BP: (114-141)/(71-87) 114/71 (02/17 0434) SpO2:  [97 %-100 %] 97 % (02/17 0434) Last BM Date: 09/30/20  Intake/Output from previous day: 02/16 0701 - 02/17 0700 In: 2727 [I.V.:2724; NG/GT:3] Out: 1400 [Urine:1300; Emesis/NG output:100] Intake/Output this shift: No intake/output data recorded.  PE: Gen:  Alert, NAD, pleasant Card:  Regular rate and rhythm, pedal pulses 2+ BL Pulm:  Normal effort, clear to auscultation bilaterally Abd: Soft, TTP epigastrium -improved compared to previous exam, mild abdominal distention, +BS, no HSM Skin: warm and dry, no rashes  Psych: A&Ox3   Lab Results:  Recent Labs    09/29/20 0332 09/30/20 0747  WBC 9.3 7.2  HGB 10.0* 8.6*  HCT 31.1* 26.2*  PLT 309 191   BMET Recent Labs    09/28/20 0451 09/29/20 0332  NA 140 138  K 4.1 4.1  CL 110 111  CO2 21* 20*  GLUCOSE 97 143*  BUN 10 8  CREATININE 0.92 0.81  CALCIUM 8.6* 8.4*   PT/INR No results for input(s): LABPROT, INR in the last 72 hours. CMP     Component Value Date/Time   NA 138 09/29/2020 0332   K 4.1 09/29/2020 0332   CL 111 09/29/2020 0332   CO2 20 (L) 09/29/2020 0332   GLUCOSE 143 (H) 09/29/2020 0332   BUN 8 09/29/2020 0332   CREATININE 0.81 09/29/2020 0332   CREATININE 1.02 03/15/2015 1554   CALCIUM 8.4 (L) 09/29/2020 0332   PROT 6.9 09/28/2020 0451   ALBUMIN 3.5 09/28/2020 0451   AST 25 09/28/2020 0451   ALT 13 09/28/2020 0451   ALKPHOS 112 09/28/2020 0451   BILITOT 0.6 09/28/2020 0451   GFRNONAA >60 09/29/2020 0332   GFRAA >60 07/02/2019 0953   Lipase     Component Value Date/Time   LIPASE 36 09/28/2020 0451        Studies/Results: CT ABDOMEN PELVIS W CONTRAST  Result Date: 09/28/2020 CLINICAL DATA:  Abdominal pain. EXAM: CT ABDOMEN AND PELVIS WITH CONTRAST TECHNIQUE: Multidetector CT imaging of the abdomen and pelvis was performed using the standard protocol following bolus administration of intravenous contrast. CONTRAST:  125mL OMNIPAQUE IOHEXOL 300 MG/ML  SOLN COMPARISON:  None. FINDINGS: Lower chest: Insert lung bases Hepatobiliary: No hepatic lesions or intrahepatic biliary dilatation. The gallbladder is surgically absent. No common bile duct dilatation. Pancreas: No mass, inflammation or ductal dilatation. Spleen: Normal size.  No focal lesions. Adrenals/Urinary Tract: Adrenal glands and kidneys are unremarkable. The bladder is normal. Stomach/Bowel: Surgical changes from gastric bypass surgery. The stomach is unremarkable. There are dilated small bowel loops with air-fluid levels consistent with a small-bowel obstruction. This appears to be due to adhesions in the left abdomen near the distal anastomosis. The mid distal small bowel loops are normal in caliber/decompressed. The colon is unremarkable. It is largely decompressed. The appendix is normal. The terminal ileum is normal. Vascular/Lymphatic: The aorta is normal in caliber. No dissection. The branch vessels are patent. The major venous structures are patent. No mesenteric or retroperitoneal mass or adenopathy. Small scattered lymph nodes are noted. Reproductive: Mildly enlarged fibroid uterus. Other: No pelvic mass or adenopathy. No free pelvic fluid collections. No inguinal mass or  adenopathy. No abdominal wall hernia or subcutaneous lesions. Musculoskeletal: No significant bony findings. IMPRESSION: 1. CT findings consistent with a small-bowel obstruction likely due to adhesions in the left abdomen near the duodenal jejunal anastomosis. 2. Surgical changes from gastric bypass surgery. 3. Status post cholecystectomy. No biliary dilatation. 4.  Mildly enlarged fibroid uterus. Electronically Signed   By: Marijo Sanes M.D.   On: 09/28/2020 14:37   DG Abd Portable 1V  Result Date: 09/30/2020 CLINICAL DATA:  Small-bowel obstruction. EXAM: PORTABLE ABDOMEN - 1 VIEW COMPARISON:  09/29/2020. FINDINGS: Surgical clips upper abdomen. No bowel distention noted on today's exam. Oral contrast noted throughout the colon and rectum. No acute bony abnormality. IMPRESSION: No bowel distention noted on today's exam. Oral contrast noted throughout the colon and rectum. Electronically Signed   By: Marcello Moores  Register   On: 09/30/2020 07:38   DG Abd Portable 1V-Small Bowel Obstruction Protocol-initial, 8 hr delay  Result Date: 09/29/2020 CLINICAL DATA:  Small-bowel obstruction 8 hour delay EXAM: PORTABLE ABDOMEN - 1 VIEW COMPARISON:  09/28/2020 FINDINGS: There is dilated small bowel in the central abdomen. Some of the loops containing contrast material. There is contrast within the ascending colon. IMPRESSION: Dilated small bowel in the central abdomen. Some contrast material within the ascending colon. Electronically Signed   By: Ulyses Jarred M.D.   On: 09/29/2020 02:50   DG Abd Portable 1V-Small Bowel Protocol-Position Verification  Result Date: 09/28/2020 CLINICAL DATA:  Enteric catheter placement EXAM: PORTABLE ABDOMEN - 1 VIEW COMPARISON:  09/28/2020 FINDINGS: Frontal view of the lower chest and upper abdomen demonstrates enteric catheter tip and side port projecting over the gastric fundus. Distended gas-filled loops of bowel within the central upper abdomen consistent with small-bowel obstruction identified on preceding CT. Lung bases are clear. IMPRESSION: 1. Enteric catheter overlying the gastric fundus. Electronically Signed   By: Randa Ngo M.D.   On: 09/28/2020 16:58   [place] EKG SITE RITE  Result Date: 09/29/2020 If Site Rite image not attached, placement could not be confirmed due to current cardiac rhythm.   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 - PRN analgesics and antiemetics   FEN: CLD ID: none VTE: SCD's, hep gtt Dispo: D/C NG tube and start clears, advance as tolerated. Hopefully will be able to D/C home in 24 hours if tolerates PO and has ongoing bowel function.     LOS: 2 days    Obie Dredge, [facility] Please see Amion for pager number during day hours 7:00am-4:30pm

## 2020-09-30 NOTE — Progress Notes (Signed)
Spoke with [employer], notified to pull PIV to RFA and connect heparin to RUE midline after changing tubing. VU. Fran Lowes, RN VAST

## 2020-09-30 NOTE — Progress Notes (Incomplete)
[place] for Heparin Indication: recurrent DVT  Allergies  Allergen Reactions  . Hydrocodone   . Oxycodone Other (See Comments)    Chest pain    Patient Measurements: Height: 5\' 4"  (162.6 cm) Weight: 99.8 kg (220 lb) IBW/kg (Calculated) : 54.7 Heparin Dosing Weight: 77.8 kg  Vital Signs: Temp: 98 F (36.7 C) (02/17 2050) Temp Source: Oral (02/17 1308) BP: 109/57 (02/17 2050) Pulse Rate: 71 (02/17 2050)  Labs: Recent Labs    09/28/20 0451 09/28/20 2249 09/28/20 2249 09/29/20 0332 09/29/20 1437 09/30/20 0747 09/30/20 0840 09/30/20 2248  HGB 9.7*  --   --  10.0*  --  8.6*  --   --   HCT 30.3*  --   --  31.1*  --  26.2*  --   --   PLT 298  --   --  309  --  191  --   --   APTT  --  40*  --   --   --   --   --   --   HEPARINUNFRC  --  0.16*   < > <0.10*   < > 1.46* 1.26* 0.60  CREATININE 0.92  --   --  0.81  --  0.92  --   --    < > = values in this interval not displayed.    Estimated Creatinine Clearance: 85.8 mL/min (by C-G formula based on SCr of 0.92 mg/dL).   Assessment: 50 yo F presents with SBO. Has history of recurrent DVT and was on Eliquis PTA. Pharmacy asked to switch Eliquis to Heparin. Last dose Eliquis PTA on 2/13.  2/17 2248 HL 0.6 therapeutic after 1hr hold of heparin gtt 1050 units at 0930. Heparin drip paused for ~30 mins to switch infusion to RUE midline and restarted at 1933.   Heparin level is supra-therapeutic this AM.  Confirmed that lab was drawn appropriately (heparin infusing through pIV and lab drawn from PICC).  No bleeding reported although hemoglobin/hematrocrit/platelets are trending down.  Goal of Therapy:  Heparin level 0.3-0.7 units/ml Monitor platelets by anticoagulation protocol: Yes   Plan:  Hold heparin - done around 0930 At 1030, restart heparin infusion at 1050 units/hr Check 6 hr heparin level Daily heparin level and CBC  Al 09/30/2020, 11:58 PM

## 2020-09-30 NOTE — Progress Notes (Signed)
[place] for Heparin Indication: recurrent DVT  Allergies  Allergen Reactions  . Hydrocodone   . Oxycodone Other (See Comments)    Chest pain    Patient Measurements: Height: 5\' 4"  (162.6 cm) Weight: 99.8 kg (220 lb) IBW/kg (Calculated) : 54.7 Heparin Dosing Weight: 77.8 kg  Vital Signs: Temp: 98 F (36.7 C) (02/17 2050) Temp Source: Oral (02/17 1308) BP: 109/57 (02/17 2050) Pulse Rate: 71 (02/17 2050)  Labs: Recent Labs    09/28/20 0451 09/28/20 2249 09/28/20 2249 09/29/20 0332 09/29/20 1437 09/30/20 0747 09/30/20 0840 09/30/20 2248  HGB 9.7*  --   --  10.0*  --  8.6*  --   --   HCT 30.3*  --   --  31.1*  --  26.2*  --   --   PLT 298  --   --  309  --  191  --   --   APTT  --  40*  --   --   --   --   --   --   HEPARINUNFRC  --  0.16*   < > <0.10*   < > 1.46* 1.26* 0.60  CREATININE 0.92  --   --  0.81  --  0.92  --   --    < > = values in this interval not displayed.    Estimated Creatinine Clearance: 85.8 mL/min (by C-G formula based on SCr of 0.92 mg/dL).   Assessment: 50 yo F presents with SBO. Has history of recurrent DVT and was on Eliquis PTA. Pharmacy asked to switch Eliquis to Heparin. Last dose Eliquis PTA on 2/13.  2/17 2248 Heparin Level 0.6 - therapeutic after 1hr hold of heparin gtt 1050 units at 0930 and restarted at 1030. Heparin drip then paused at 1852 for ~30 mins to switch infusion to RUE midline and restarted at 1933. No bleeding reported although hemoglobin/hematrocrit/platelets are trending down - pt menstruating.   Goal of Therapy:  Heparin level 0.3-0.7 units/ml Monitor platelets by anticoagulation protocol: Yes   Plan:  Continue heparin drip 1050 units/hr  Check 6 hr heparin level Daily heparin level and CBC  Durward Fortes, PharmD Student 09/30/2020, 11:58 PM

## 2020-09-30 NOTE — Plan of Care (Signed)

## 2020-09-30 NOTE — Progress Notes (Signed)
[place] for Heparin Indication: recurrent DVT  Allergies  Allergen Reactions  . Hydrocodone   . Oxycodone Other (See Comments)    Chest pain    Patient Measurements: Height: 5\' 4"  (162.6 cm) Weight: 99.8 kg (220 lb) IBW/kg (Calculated) : 54.7 Heparin Dosing Weight: 77.8 kg  Vital Signs: Temp: 98.4 F (36.9 C) (02/17 0434) BP: 114/71 (02/17 0434) Pulse Rate: 73 (02/17 0434)  Labs: Recent Labs    09/28/20 0451 09/28/20 2249 09/28/20 2249 09/29/20 0332 09/29/20 1437 09/30/20 0415 09/30/20 0747  HGB 9.7*  --   --  10.0*  --   --  8.6*  HCT 30.3*  --   --  31.1*  --   --  26.2*  PLT 298  --   --  309  --   --  191  APTT  --  40*  --   --   --   --   --   HEPARINUNFRC  --  0.16*   < > <0.10* 0.48 0.84* 1.46*  CREATININE 0.92  --   --  0.81  --   --   --    < > = values in this interval not displayed.    Estimated Creatinine Clearance: 97.5 mL/min (by C-G formula based on SCr of 0.81 mg/dL).   Assessment: 50 yo F presents with SBO. Has history of recurrent DVT and was on Eliquis PTA. Pharmacy asked to switch Eliquis to Heparin. Last dose Eliquis PTA on 2/13.  Heparin level is supra-therapeutic this AM.  Confirmed that lab was drawn appropriately (heparin infusing through pIV and lab drawn from PICC).  No bleeding reported although hemoglobin/hematrocrit/platelets are trending down.  Goal of Therapy:  Heparin level 0.3-0.7 units/ml Monitor platelets by anticoagulation protocol: Yes   Plan:  Hold heparin - done around 0930 At 1030, restart heparin infusion at 1050 units/hr Check 6 hr heparin level Daily heparin level and CBC  Braydan Marriott D. Mina Marble, PharmD, BCPS, [place] 09/30/2020, 9:28 AM

## 2020-09-30 NOTE — Progress Notes (Signed)
Patient had large, soft, formed bowel movement this morning. Positive bowel sounds auscultated in all four quadrants. No c/o nausea/vomiting noted. NGT remains in place to [facility]. Will continue to monitor.

## 2020-10-01 LAB — CBC
HCT: 25.3 % — ABNORMAL LOW (ref 36.0–46.0)
Hemoglobin: 7.8 g/dL — ABNORMAL LOW (ref 12.0–15.0)
MCH: 23.5 pg — ABNORMAL LOW (ref 26.0–34.0)
MCHC: 30.8 g/dL (ref 30.0–36.0)
MCV: 76.2 fL — ABNORMAL LOW (ref 80.0–100.0)
Platelets: 224 K/uL (ref 150–400)
RBC: 3.32 MIL/uL — ABNORMAL LOW (ref 3.87–5.11)
RDW: 17.9 % — ABNORMAL HIGH (ref 11.5–15.5)
WBC: 5.7 K/uL (ref 4.0–10.5)
nRBC: 0 % (ref 0.0–0.2)

## 2020-10-01 LAB — HEPARIN LEVEL (UNFRACTIONATED): Heparin Unfractionated: 0.62 [IU]/mL (ref 0.30–0.70)

## 2020-10-01 MED ORDER — APIXABAN 5 MG PO TABS
5.0000 mg | ORAL_TABLET | Freq: Once | ORAL | Status: AC
  Administered 2020-10-01: 5 mg via ORAL
  Filled 2020-10-01 (×2): qty 1

## 2020-10-01 NOTE — Discharge Summary (Signed)
[facility] Discharge Summary   Patient ID: Yesenia Wheeler MRN: [id number] DOB/AGE: [birth year] 50 y.o.  Admit date: 09/28/2020 Discharge date: 10/01/2020  Discharge Diagnosis Patient Active Problem List   Diagnosis Date Noted  . SBO (small bowel obstruction) ([place]) 09/28/2020  . Left breast mass 10/23/2017  . Acute medial meniscus tear of left knee 08/30/2017  . BV (bacterial vaginosis) 06/23/2015  . Tinea pedis 06/21/2015  . Screening-pulmonary TB 03/15/2015  . Obesity (BMI 30-39.9) 05/26/2014  . Asthma, chronic 05/25/2014  . Migraine 05/25/2014  . Chronic back pain 05/25/2014    Imaging: DG Abd Portable 1V  Result Date: 09/30/2020 CLINICAL DATA:  Small-bowel obstruction. EXAM: PORTABLE ABDOMEN - 1 VIEW COMPARISON:  09/29/2020. FINDINGS: Surgical clips upper abdomen. No bowel distention noted on today's exam. Oral contrast noted throughout the colon and rectum. No acute bony abnormality. IMPRESSION: No bowel distention noted on today's exam. Oral contrast noted throughout the colon and rectum. Electronically Signed   By: Marcello Moores  Register   On: 09/30/2020 07:38    Procedures None  HPI: Ms. 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 Pain described as sharp, stabbing epigastric pain that is non-radiating. Denies alleviating factors. Has been unable to tolerate PO, even liquids, for about two days. States she thought her pain may be due to her fibroids.    Denies fever, chills, sick contacts, hematemesis, melena, hematochezia, or urinary sxs. Pt is currently on her period which she states is normal for her (painful and heavy due to 13 uterine fibroids). Pt reports smoking multiple cigars (black & mild) daily. Denies narcotic or IVDU. States she does not  tolerate oxycodone. She reports last dose of eliquis as 3-4 days ago.   Hospital Course:  .Workup showed pSBO.  Patient was admitted and underwent NG tube placement and the small bowel protocol with gastrografin. Due to NPO status with PMH recurrent DVT on Eliquis she was started on a hep gtt. Contrast progressed to the colon and bowel function returned. Diet advanced as tolerated. On 10/01/20 , the patient was voiding well, had multiple non-bloody BMs, tolerating diet, ambulating well, pain well controlled, vital signs stable, and felt stable for discharge home.  Patient will follow up with her PCP and knows to call with questions or concerns.    Physical Exam: General:  Alert, NAD, pleasant, comfortable Abd:  Soft, nontender, nondistended +BS  Allergies as of 10/01/2020      Reactions   Hydrocodone    Oxycodone Other (See Comments)   Chest pain      Medication List    TAKE these medications   albuterol 108 (90 Base) MCG/ACT inhaler Commonly known as: VENTOLIN HFA Inhale into the lungs every 6 (six) hours as needed for wheezing or shortness of breath.   Eliquis 5 MG Tabs tablet Generic drug: apixaban Take 5 mg by mouth daily.         Follow-up Information    Vonna Drafts, FNP. Schedule an appointment as soon as possible for a visit.   Specialty: Nurse Practitioner Why: to discuss anticoagulation for your recurrent DVTs Contact information: [place] [address] [phone]        [facility] Follow up.   Why: call as needed,  no need for scheduled follow up. Contact information: [address] [place] [address]              Signed: Obie Dredge, [facility] 10/01/2020, 3:45 PM

## 2020-10-01 NOTE — Progress Notes (Signed)
Patient discharged to home with instructions. 

## 2020-11-09 ENCOUNTER — Telehealth: Payer: Self-pay | Admitting: Oncology

## 2020-11-09 ENCOUNTER — Inpatient Hospital Stay: Payer: BC Managed Care – PPO | Admitting: Oncology

## 2020-11-09 ENCOUNTER — Telehealth: Payer: Self-pay | Admitting: *Deleted

## 2020-11-09 NOTE — Telephone Encounter (Signed)
R/s appt per 3/29 sch msg. Called pt, no answer. Left msg with appt date and time.

## 2020-11-09 NOTE — Telephone Encounter (Signed)
Phone call to patient regarding missed appointment today, pt states she was unaware of appointment.  Message sent to scheduling to reschedule.

## 2020-11-11 ENCOUNTER — Inpatient Hospital Stay: Payer: BC Managed Care – PPO | Admitting: Oncology

## 2020-11-11 ENCOUNTER — Telehealth: Payer: Self-pay | Admitting: *Deleted

## 2020-11-11 NOTE — Telephone Encounter (Signed)
Contacted patient about missed appointment today, scheduling left her a message about this appointment but patient states she did not listen to her messages. Per Dr. Alen Blew patient doesn't need a F/U visit, if she has issues or concerns she can make an appointment at that time.  Patient informed to call with any concerns, verbalizes understanding.

## 2020-12-29 ENCOUNTER — Other Ambulatory Visit: Payer: Self-pay

## 2020-12-29 ENCOUNTER — Observation Stay (HOSPITAL_COMMUNITY)
Admission: EM | Admit: 2020-12-29 | Discharge: 2020-12-31 | Disposition: A | Discharge status: Home / Self Care | Payer: Self-pay | Attending: Internal Medicine | Admitting: Internal Medicine

## 2020-12-29 ENCOUNTER — Encounter (HOSPITAL_COMMUNITY): Payer: Self-pay

## 2020-12-29 DIAGNOSIS — Z79899 Other long term (current) drug therapy: Secondary | ICD-10-CM | POA: Insufficient documentation

## 2020-12-29 DIAGNOSIS — Z7901 Long term (current) use of anticoagulants: Secondary | ICD-10-CM | POA: Insufficient documentation

## 2020-12-29 DIAGNOSIS — R531 Weakness: Secondary | ICD-10-CM

## 2020-12-29 DIAGNOSIS — J45909 Unspecified asthma, uncomplicated: Secondary | ICD-10-CM | POA: Insufficient documentation

## 2020-12-29 DIAGNOSIS — E041 Nontoxic single thyroid nodule: Secondary | ICD-10-CM | POA: Insufficient documentation

## 2020-12-29 DIAGNOSIS — Z96653 Presence of artificial knee joint, bilateral: Secondary | ICD-10-CM | POA: Insufficient documentation

## 2020-12-29 DIAGNOSIS — Z87891 Personal history of nicotine dependence: Secondary | ICD-10-CM | POA: Insufficient documentation

## 2020-12-29 DIAGNOSIS — Z20822 Contact with and (suspected) exposure to covid-19: Secondary | ICD-10-CM | POA: Insufficient documentation

## 2020-12-29 DIAGNOSIS — I82409 Acute embolism and thrombosis of unspecified deep veins of unspecified lower extremity: Secondary | ICD-10-CM

## 2020-12-29 DIAGNOSIS — Y900 Blood alcohol level of less than 20 mg/100 ml: Secondary | ICD-10-CM | POA: Insufficient documentation

## 2020-12-29 DIAGNOSIS — G540 Brachial plexus disorders: Principal | ICD-10-CM | POA: Insufficient documentation

## 2020-12-29 DIAGNOSIS — E873 Alkalosis: Secondary | ICD-10-CM | POA: Insufficient documentation

## 2020-12-29 DIAGNOSIS — Z96612 Presence of left artificial shoulder joint: Secondary | ICD-10-CM | POA: Insufficient documentation

## 2020-12-29 LAB — URINALYSIS, ROUTINE W REFLEX MICROSCOPIC
Bilirubin Urine: NEGATIVE
Glucose, UA: NEGATIVE mg/dL
Hgb urine dipstick: NEGATIVE
Ketones, ur: 5 mg/dL — AB
Leukocytes,Ua: NEGATIVE
Nitrite: NEGATIVE
Protein, ur: NEGATIVE mg/dL
Specific Gravity, Urine: 1.024 (ref 1.005–1.030)
pH: 5 (ref 5.0–8.0)

## 2020-12-29 LAB — COMPREHENSIVE METABOLIC PANEL
ALT: 13 U/L (ref 0–44)
AST: 21 U/L (ref 15–41)
Albumin: 3.5 g/dL (ref 3.5–5.0)
Alkaline Phosphatase: 94 U/L (ref 38–126)
Anion gap: 6 (ref 5–15)
BUN: 7 mg/dL (ref 6–20)
CO2: 19 mmol/L — ABNORMAL LOW (ref 22–32)
Calcium: 8.6 mg/dL — ABNORMAL LOW (ref 8.9–10.3)
Chloride: 113 mmol/L — ABNORMAL HIGH (ref 98–111)
Creatinine, Ser: 0.96 mg/dL (ref 0.44–1.00)
GFR, Estimated: >60 mL/min (ref >60)
Glucose, Bld: 85 mg/dL (ref 70–99)
Potassium: 3.8 mmol/L (ref 3.5–5.1)
Sodium: 138 mmol/L (ref 135–145)
Total Bilirubin: 0.3 mg/dL (ref 0.3–1.2)
Total Protein: 6.6 g/dL (ref 6.5–8.1)

## 2020-12-29 LAB — DIFFERENTIAL
Abs Immature Granulocytes: 0.01 10*3/uL (ref 0.00–0.07)
Basophils Absolute: 0 10*3/uL (ref 0.0–0.1)
Basophils Relative: 1 %
Eosinophils Absolute: 0.2 10*3/uL (ref 0.0–0.5)
Eosinophils Relative: 3 %
Immature Granulocytes: 0 %
Lymphocytes Relative: 46 %
Lymphs Abs: 3.1 10*3/uL (ref 0.7–4.0)
Monocytes Absolute: 0.4 10*3/uL (ref 0.1–1.0)
Monocytes Relative: 6 %
Neutro Abs: 2.9 10*3/uL (ref 1.7–7.7)
Neutrophils Relative %: 44 %

## 2020-12-29 LAB — CBC
HCT: 27.4 % — ABNORMAL LOW (ref 36.0–46.0)
Hemoglobin: 8.4 g/dL — ABNORMAL LOW (ref 12.0–15.0)
MCH: 22.6 pg — ABNORMAL LOW (ref 26.0–34.0)
MCHC: 30.7 g/dL (ref 30.0–36.0)
MCV: 73.9 fL — ABNORMAL LOW (ref 80.0–100.0)
Platelets: 245 10*3/uL (ref 150–400)
RBC: 3.71 MIL/uL — ABNORMAL LOW (ref 3.87–5.11)
RDW: 18.3 % — ABNORMAL HIGH (ref 11.5–15.5)
WBC: 6.6 10*3/uL (ref 4.0–10.5)
nRBC: 0 % (ref 0.0–0.2)

## 2020-12-29 LAB — RAPID URINE DRUG SCREEN, HOSP PERFORMED
Amphetamines: NOT DETECTED
Barbiturates: NOT DETECTED
Benzodiazepines: NOT DETECTED
Cocaine: NOT DETECTED
Opiates: NOT DETECTED
Tetrahydrocannabinol: NOT DETECTED

## 2020-12-29 LAB — APTT: aPTT: 26 seconds (ref 24–36)

## 2020-12-29 LAB — PROTIME-INR
INR: 1.1 (ref 0.8–1.2)
Prothrombin Time: 13.8 seconds (ref 11.4–15.2)

## 2020-12-29 LAB — I-STAT BETA HCG BLOOD, ED (MC, WL, AP ONLY): I-stat hCG, quantitative: <5 m[IU]/mL (ref <5)

## 2020-12-29 LAB — ETHANOL: Alcohol, Ethyl (B): <10 mg/dL (ref <10)

## 2020-12-29 NOTE — ED Provider Notes (Addendum)
[facility] EMERGENCY DEPARTMENT Provider Note   CSN: [id number] Arrival date & time: 12/29/20  2048     History Chief Complaint  Patient presents with  . left sided weakness    Yesenia Wheeler is a 50 y.o. female.  The history is provided by the patient and medical records.    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  States she went to bed as normal on 12/21/2020 when she woke up the following morning she noticed that her left side was weak.  She attributed this to "sleeping wrong" and thought she may just have some nerve issues.  States symptoms have persistent without improvement but have not worsened either.  States in order to use left arm or leg, she is mostly having to pick it up with her right hand to move it.  States when she does walk, she feels like she is somewhat dragging her left leg. She is right hand dominant.  Denies any pain of the extremities but states sometimes she feels like she is getting "spasms".  She is not currently experiencing any headaches, neck pain, back pain, dizziness, blurred vision, speech disturbance, tinnitus, facial droop, numbness/tingling, or difficulty swallowing.  She denies history of TIA or stroke.  No family history of stroke.  She is a former smoker.  At home she has been doing warm compresses/soaks and applying muscle rubs in case it was a tension type problem.  Past Medical History:  Diagnosis Date  . Acute meniscal tear of left knee   . Anemia   . 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 Migraines   . Mild asthma   . OA (osteoarthritis)    bilateral knees  . SBO (small bowel obstruction) ([place]) 09/2020    Patient Active Problem List   Diagnosis Date  Noted  . SBO (small bowel obstruction) ([place]) 09/28/2020  . Left breast mass 10/23/2017  . Acute medial meniscus tear of left knee 08/30/2017  . BV (bacterial vaginosis) 06/23/2015  . Tinea pedis 06/21/2015  . Screening-pulmonary TB 03/15/2015  . Obesity (BMI 30-39.9) 05/26/2014  . Asthma, chronic 05/25/2014  . Migraine 05/25/2014  . Chronic back pain 05/25/2014    Past Surgical History:  Procedure Laterality Date  . BREAST LUMPECTOMY WITH RADIOACTIVE SEED LOCALIZATION Left 10/23/2017   Procedure: BREAST LUMPECTOMY WITH RADIOACTIVE SEED LOCALIZATION;  Surgeon: Erroll Luna, MD;  Location: [facility];  Service: General;  Laterality: Left;  . BREAST REDUCTION SURGERY Bilateral 10/23/2017   Procedure: LEFT BREAST ONCOPLASTIC REDUCTION, RIGHT BREAST REDUCTION;  Surgeon: Irene Limbo, MD;  Location: [facility];  Service: Plastics;  Laterality: Bilateral;  . CESAREAN SECTION  2011  . GASTRIC BYPASS  2001  . KNEE ARTHROSCOPY W/ MENISCECTOMY Right 2007  . KNEE ARTHROSCOPY WITH MEDIAL MENISECTOMY Left 08/30/2017   Procedure: KNEE ARTHROSCOPY WITH PARTIAL MEDIAL MENISECTOMY;  Surgeon: Nicholes Stairs, MD;  Location: [facility];  Service: Orthopedics;  Laterality: Left;  60 mins  . LAPAROSCOPIC CHOLECYSTECTOMY  2001  . SHOULDER ARTHROSCOPY Left 2008   spur     OB History    Gravida  14   Para  Term      Preterm      AB  14   Living  3     SAB  14   IAB      Ectopic      Multiple      Live Births              Family History  Problem Relation Age of Onset  . Cancer Father        oral    Social History   Tobacco Use  . Smoking status: Former Smoker    Years: 6.00    Types: Cigarettes    Quit date: 06/23/2017    Years since quitting: 3.5  . Smokeless tobacco: Never Used  . Tobacco comment: 2ppwk  Vaping Use  . Vaping Use: Former  Substance Use Topics  . Alcohol use: Yes    Comment: occasional   . Drug use: No    Home Medications Prior to Admission medications   Medication Sig Start Date End Date Taking? Authorizing Provider  albuterol (VENTOLIN HFA) 108 (90 Base) MCG/ACT inhaler Inhale into the lungs every 6 (six) hours as needed for wheezing or shortness of breath.    [provider]  ELIQUIS 5 MG TABS tablet Take 5 mg by mouth daily. 04/07/20   [provider]  enoxaparin (LOVENOX) 40 MG/0.4ML injection Inject 0.4 mLs (40 mg total) into the skin daily. Start 3.14.19 Patient not taking: Reported on 04/30/2018 10/24/17 03/16/20  Irene Limbo, MD    Allergies    Hydrocodone and Oxycodone  Review of Systems   Review of Systems  Neurological: Positive for weakness.  All other systems reviewed and are negative.   Physical Exam Updated Vital Signs BP 132/86 (BP Location: Right Arm)   Pulse 78   Temp 98.5 F (36.9 C) (Oral)   Resp 18   Ht 5\' 4"  (1.626 m)   Wt 99.8 kg   SpO2 98%   BMI 37.76 kg/m   Physical Exam Vitals and nursing note reviewed.  Constitutional:      Appearance: She is well-developed.  HENT:     Head: Normocephalic and atraumatic.  Eyes:     Conjunctiva/sclera: Conjunctivae normal.     Pupils: Pupils are equal, round, and reactive to light.     Comments: No ptosis, PERRL  Cardiovascular:     Rate and Rhythm: Normal rate and regular rhythm.     Heart sounds: Normal heart sounds.  Pulmonary:     Effort: Pulmonary effort is normal.     Breath sounds: Normal breath sounds.  Abdominal:     General: Bowel sounds are normal.     Palpations: Abdomen is soft.  Musculoskeletal:        General: Normal range of motion.     Cervical back: Normal range of motion.  Skin:    General: Skin is warm and dry.  Neurological:     Mental Status: She is alert and oriented to person, place, and time.     Comments: AAOx3, answering questions and following commands appropriately; left upper and lower extremity weakness compared with right; able  to lift leg and arm up but not hold for sustained amounts of time and appears to shake when doing so; normal finger-nose-finger on right, states she cannot do on left; no facial asymmetry appreciated, speech clear and goal oriented     ED Results / Procedures / Treatments   Labs (all labs ordered are listed, but only abnormal results  are displayed) Labs Reviewed  CBC - Abnormal; Notable for the following components:      Result Value   RBC 3.71 (*)    Hemoglobin 8.4 (*)    HCT 27.4 (*)    MCV 73.9 (*)    MCH 22.6 (*)    RDW 18.3 (*)    All other components within normal limits  COMPREHENSIVE METABOLIC PANEL - Abnormal; Notable for the following components:   Chloride 113 (*)    CO2 19 (*)    Calcium 8.6 (*)    All other components within normal limits  URINALYSIS, ROUTINE W REFLEX MICROSCOPIC - Abnormal; Notable for the following components:   APPearance HAZY (*)    Ketones, ur 5 (*)    All other components within normal limits  RESP PANEL BY RT-PCR (FLU A&B, COVID) ARPGX2  ETHANOL  PROTIME-INR  APTT  DIFFERENTIAL  RAPID URINE DRUG SCREEN, HOSP PERFORMED  I-STAT BETA HCG BLOOD, ED ([facility], [facility], [facility] ONLY)    EKG None  Radiology CT Head Wo Contrast  Result Date: 12/30/2020 CLINICAL DATA:  Acute left-sided weakness EXAM: CT HEAD WITHOUT CONTRAST TECHNIQUE: Contiguous axial images were obtained from the base of the skull through the vertex without intravenous contrast. COMPARISON:  None. FINDINGS: Brain: Normal anatomic configuration. No abnormal intra or extra-axial mass lesion or fluid collection. No abnormal mass effect or midline shift. No evidence of acute intracranial hemorrhage or infarct. Ventricular size is normal. Cerebellum unremarkable. Vascular: Unremarkable Skull: Intact Sinuses/Orbits: Paranasal sinuses are clear. Orbits are unremarkable. Other: Mastoid air cells and middle ear cavities are clear. IMPRESSION: No acute intracranial abnormality.  Normal examination.  Electronically Signed   By: Fidela Salisbury MD   On: 12/30/2020 01:37   MR Brain W and Wo Contrast  Result Date: 12/30/2020 CLINICAL DATA:  50 year old female with left side weakness since May 11th. On Eliquis for DVT. Unrevealing noncontrast head CT. EXAM: MRI HEAD WITHOUT AND WITH CONTRAST TECHNIQUE: Multiplanar, multiecho pulse sequences of the brain and surrounding structures were obtained without and with intravenous contrast. CONTRAST:  12mL GADAVIST GADOBUTROL 1 MMOL/ML IV SOLN COMPARISON:  Noncontrast head CT 0110 hours. FINDINGS: Brain: Cerebral volume is within normal limits. No restricted diffusion to suggest acute infarction. No midline shift, mass effect, evidence of mass lesion, ventriculomegaly, extra-axial collection or acute intracranial hemorrhage. Cervicomedullary junction and pituitary are within normal limits. Pearline Cables and white matter signal is within normal limits throughout the brain. No encephalomalacia identified. No definite chronic cerebral blood products. No abnormal enhancement identified.  No dural thickening. Vascular: Major intracranial vascular flow voids are preserved. Mild intracranial artery tortuosity. The major dural venous sinuses are enhancing and appear to be patent. Skull and upper cervical spine: Cervical spine detailed separately. Visualized bone marrow signal is within normal limits. Sinuses/Orbits: Negative orbits. Trace paranasal sinus mucosal thickening. No sinus fluid level. Other: Trace mastoid air cell fluid greater on the left, likely postinflammatory. There are several small mucous retention cysts in the midline nasopharynx (benign). Other visible internal auditory structures appear grossly normal. Visible scalp and face appear negative. IMPRESSION: Normal MRI appearance of the brain.  No explanation for weakness. Electronically Signed   By: Genevie Ann M.D.   On: 12/30/2020 04:52   MR CERVICAL SPINE W WO CONTRAST  Result Date: 12/30/2020 CLINICAL DATA:   50 year old female with left side weakness since May 11th. On Eliquis for DVT. Unrevealing noncontrast head CT. EXAM: MRI CERVICAL SPINE WITHOUT AND WITH CONTRAST TECHNIQUE: Multiplanar and multiecho  pulse sequences of the cervical spine, to include the craniocervical junction and cervicothoracic junction, were obtained without and with intravenous contrast. CONTRAST:  62mL GADAVIST GADOBUTROL 1 MMOL/ML IV SOLN in conjunction with contrast enhanced imaging of the brain reported separately. COMPARISON:  Brain MRI today reported separately. FINDINGS: Alignment: Straightening and mild reversal of cervical lordosis. No significant spondylolisthesis. Vertebrae: No marrow edema or evidence of acute osseous abnormality. Mildly heterogeneous background spinal bone marrow signal similar to that in the skull. Cord: Normal. Fairly capacious spinal canal. No abnormal intradural enhancement. No dural thickening. Posterior Fossa, vertebral arteries, paraspinal tissues: Cervicomedullary junction is within normal limits. Preserved major vascular flow voids in the neck. Negative visible neck soft tissues and lung apices. Disc levels: C2-C3:  Negative. C3-C4:  Negative. C4-C5: Disc space loss with circumferential disc bulge and endplate spurring most affecting the foramina. Narrows ventral CSF space but no spinal stenosis. Mild left and moderate to severe right C5 foraminal stenosis. C5-C6: Disc space loss with circumferential disc bulge and endplate spurring most affecting the foramina. Effaced ventral CSF space but no significant spinal stenosis. Moderate to severe left and mild-to-moderate right C6 foraminal stenosis. C6-C7: Disc space loss with circumferential disc bulge and endplate spurring. No spinal stenosis. Mild to moderate left and moderate to severe right C7 foraminal stenosis. C7-T1:  Mild facet hypertrophy.  No stenosis. No visible upper thoracic spinal stenosis. IMPRESSION: 1. Normal cervical spinal cord, and no  significant spinal stenosis despite multilevel disc and endplate degeneration. 2. 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 Electronically Signed   By: Genevie Ann M.D.   On: 12/30/2020 04:57    Procedures Procedures   Medications Ordered in ED Medications  LORazepam (ATIVAN) injection 1 mg (1 mg Intravenous Given 12/30/20 0317)  gadobutrol (GADAVIST) 1 MMOL/ML injection 10 mL (10 mLs Intravenous Contrast Given 12/30/20 0424)    ED Course  I have reviewed the triage vital signs and the nursing notes.  Pertinent labs & imaging results that were available during my care of the patient were reviewed by me and considered in my medical decision making (see chart for details).    MDM Rules/Calculators/A&P  50 year old female presenting to the ED with 1 week of left-sided weakness.  Went to bed normal 12/21/2020 and woke up 12/22/2020 with left-sided weakness.  There is been no progression nor improvement of her symptoms.  Now feels like her left leg is dragging when walking and is pretty much moving her left extremities using her right arm.  She denies any significant headache, neck pain, or back pain.  Does report some "spasms" in the extremities.  Exam findings as above, I question if some exam findings may be effort dependent.  She is chronically anticoagulated with eliqius for chronic DVT but otherwise no significant risk factors for TIA/CVA.  Will get CT head, labs.    2:25 AM CT negative.  Labs reassuring.  Spoke with on call neurology, Dr. Curly Shores-- recommends MRI brain and cervical spine with and without contrast for further evaluation.    MRI's without acute stroke, does have some disc disease of the neck on left and right at various levels but no evidence of cord compression. Exam remains largely unchanged, but she has been able to maneuver and change position in bed without assistance.  Unclear  etiology of her left sided weakness.  Updated Dr. Curly Shores-- will see for formal consultation and  give further recommendations.  Care signed out to oncoming provider to follow-up on neuro recommendations.  Final Clinical Impression(s) / ED Diagnoses Final diagnoses:  Left-sided weakness    Rx / DC Orders ED Discharge Orders    None       Larene Pickett, PA-C 12/30/20 0636    Larene Pickett, PA-C 12/30/20 [id number]    Randal Buba, April, MD 12/30/20 [id number]

## 2020-12-29 NOTE — ED Provider Notes (Signed)
Emergency Medicine Provider Triage Evaluation Note  Yesenia Wheeler , a 50 y.o. female  was evaluated in triage.  Pt complains of left sided weakness since 5/11. Went to sleep on 5/10 feeling normal, woke on 5/11 with significant left sided weakness, cannot raise arm past 20 degrees actively on the left arm. Feels she is dragging her left leg. On eliquis for DVT.   Review of Systems  Positive: Left arm and leg weakness Negative: Altered sensation, speech difficulty, confusion, LOC, trauma  Physical Exam  BP 132/86 (BP Location: Right Arm)   Pulse 78   Temp 98.5 F (36.9 C) (Oral)   Resp 18   Ht 5\' 4"  (1.626 m)   Wt 99.8 kg   SpO2 98%   BMI 37.76 kg/m  Gen:   Awake, no distress   Resp:  Normal effort  MSK:   Marked left sided weakness in the upper and lower extremity, relative to the right Other:  Normal sensation in all 4 extremities, CN intact.  Medical Decision Making  Medically screening exam initiated at 9:46 PM.  Appropriate orders placed.  Leshea Jaggers was informed that the remainder of the evaluation will be completed by another provider, this initial triage assessment does not replace that evaluation, and the importance of remaining in the ED until their evaluation is complete.  Concern for completed stroke given unilateral weakness x 1 week, without improvement. No worsening since 5/11. Will proceed with stroke work up.   This chart was dictated using voice recognition software, Dragon. Despite the best efforts of this provider to proofread and correct errors, errors may still occur which can change documentation meaning.    Aura Dials 12/29/20 2146    Arnaldo Natal, MD 12/29/20 2158

## 2020-12-29 NOTE — ED Notes (Signed)
ED Provider at bedside. 

## 2020-12-29 NOTE — ED Triage Notes (Signed)
Left sided weakness since last week 5/11. Pt slept on 5/10 and woke up 5/11 and left side was already weak. Pt also reports she feels she is dragging her left leg when walking. No slurring of speech.    Takes eliquis from hx of DVT.

## 2020-12-30 ENCOUNTER — Emergency Department (HOSPITAL_COMMUNITY): Payer: Self-pay

## 2020-12-30 ENCOUNTER — Observation Stay (HOSPITAL_COMMUNITY): Payer: Self-pay

## 2020-12-30 DIAGNOSIS — R531 Weakness: Secondary | ICD-10-CM

## 2020-12-30 DIAGNOSIS — I82409 Acute embolism and thrombosis of unspecified deep veins of unspecified lower extremity: Secondary | ICD-10-CM

## 2020-12-30 DIAGNOSIS — D649 Anemia, unspecified: Secondary | ICD-10-CM

## 2020-12-30 DIAGNOSIS — E873 Alkalosis: Secondary | ICD-10-CM

## 2020-12-30 LAB — I-STAT ARTERIAL BLOOD GAS, ED
Acid-base deficit: 6 mmol/L — ABNORMAL HIGH (ref 0.0–2.0)
Bicarbonate: 19 mmol/L — ABNORMAL LOW (ref 20.0–28.0)
Calcium, Ion: 1.22 mmol/L (ref 1.15–1.40)
HCT: 24 % — ABNORMAL LOW (ref 36.0–46.0)
Hemoglobin: 8.2 g/dL — ABNORMAL LOW (ref 12.0–15.0)
O2 Saturation: 97 %
Patient temperature: 98.5
Potassium: 3.4 mmol/L — ABNORMAL LOW (ref 3.5–5.1)
Sodium: 142 mmol/L (ref 135–145)
TCO2: 20 mmol/L — ABNORMAL LOW (ref 22–32)
pCO2 arterial: 33.1 mmHg (ref 32.0–48.0)
pH, Arterial: 7.366 (ref 7.350–7.450)
pO2, Arterial: 93 mmHg (ref 83.0–108.0)

## 2020-12-30 LAB — LIPID PANEL
Cholesterol: 155 mg/dL (ref 0–200)
HDL: 40 mg/dL — ABNORMAL LOW (ref >40)
LDL Cholesterol: 111 mg/dL — ABNORMAL HIGH (ref 0–99)
Total CHOL/HDL Ratio: 3.9 RATIO
Triglycerides: 22 mg/dL (ref <150)
VLDL: 4 mg/dL (ref 0–40)

## 2020-12-30 LAB — IRON AND TIBC
Iron: 26 ug/dL — ABNORMAL LOW (ref 28–170)
Saturation Ratios: 6 % — ABNORMAL LOW (ref 10.4–31.8)
TIBC: 463 ug/dL — ABNORMAL HIGH (ref 250–450)
UIBC: 437 ug/dL

## 2020-12-30 LAB — HEMOGLOBIN A1C
Hgb A1c MFr Bld: 5.5 % (ref 4.8–5.6)
Mean Plasma Glucose: 111.15 mg/dL

## 2020-12-30 LAB — FERRITIN: Ferritin: 3 ng/mL — ABNORMAL LOW (ref 11–307)

## 2020-12-30 LAB — HIV ANTIBODY (ROUTINE TESTING W REFLEX): HIV Screen 4th Generation wRfx: NONREACTIVE

## 2020-12-30 LAB — SEDIMENTATION RATE: Sed Rate: 14 mm/hr (ref 0–22)

## 2020-12-30 LAB — RESP PANEL BY RT-PCR (FLU A&B, COVID) ARPGX2
Influenza A by PCR: NEGATIVE
Influenza B by PCR: NEGATIVE
SARS Coronavirus 2 by RT PCR: NEGATIVE

## 2020-12-30 MED ORDER — POLYETHYLENE GLYCOL 3350 17 G PO PACK: 17.0000 g | PACK | Freq: Every day | ORAL | Status: DC | PRN

## 2020-12-30 MED ORDER — GADOBUTROL 1 MMOL/ML IV SOLN
10.0000 mL | Freq: Once | INTRAVENOUS | Status: AC | PRN
  Administered 2020-12-30: 10 mL via INTRAVENOUS

## 2020-12-30 MED ORDER — APIXABAN 5 MG PO TABS
5.0000 mg | ORAL_TABLET | Freq: Two times a day (BID) | ORAL | Status: DC
  Administered 2020-12-30 – 2020-12-31 (×3): 5 mg via ORAL
  Filled 2020-12-30 (×3): qty 1

## 2020-12-30 MED ORDER — LORAZEPAM 2 MG/ML IJ SOLN
1.0000 mg | Freq: Once | INTRAMUSCULAR | Status: AC | PRN
  Administered 2020-12-30: 1 mg via INTRAVENOUS
  Filled 2020-12-30: qty 1

## 2020-12-30 MED ORDER — APIXABAN 5 MG PO TABS: 5.0000 mg | ORAL_TABLET | Freq: Every day | ORAL | Status: DC

## 2020-12-30 MED ORDER — POLYSACCHARIDE IRON COMPLEX 150 MG PO CAPS
150.0000 mg | ORAL_CAPSULE | Freq: Every day | ORAL | Status: DC
  Administered 2020-12-31: 150 mg via ORAL
  Filled 2020-12-30: qty 1

## 2020-12-30 MED ORDER — POLYSACCHARIDE IRON COMPLEX 150 MG PO CAPS
150.0000 mg | ORAL_CAPSULE | Freq: Every day | ORAL | Status: DC
  Filled 2020-12-30: qty 1

## 2020-12-30 MED ORDER — ALBUTEROL SULFATE (2.5 MG/3ML) 0.083% IN NEBU: 3.0000 mL | INHALATION_SOLUTION | Freq: Four times a day (QID) | RESPIRATORY_TRACT | Status: DC | PRN

## 2020-12-30 MED ORDER — BACLOFEN 10 MG PO TABS
5.0000 mg | ORAL_TABLET | Freq: Three times a day (TID) | ORAL | Status: DC
  Administered 2020-12-30 – 2020-12-31 (×4): 5 mg via ORAL
  Filled 2020-12-30 (×7): qty 1

## 2020-12-30 NOTE — ED Notes (Signed)
Pt has returned from MRI. 

## 2020-12-30 NOTE — ED Notes (Signed)
Pt transported to MRI 

## 2020-12-30 NOTE — ED Notes (Signed)
Report attempted 

## 2020-12-30 NOTE — H&P (Addendum)
Date: 12/30/2020               Patient Name:  Yesenia Wheeler MRN: [id number]  DOB: 10/10/1970 Age / Sex: 50 y.o., female   PCP: Pcp, No         Medical Service: Internal Medicine Teaching Service         Attending Physician: Dr. Jimmye Norman    First Contact: Dr. Rosita Kea Pager: [phone]  Second Contact: Dr. 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  At the same time, she felt tingling of her knee and foot which lasted for about approximately 30 minutes.  Given that her symptoms had subsided, she went to bed however on Thursday morning she noticed weakness of her left arm to the point that she had to use her right arm to assist.  She states that since her symptoms started on Wednesday, she has experienced spasms at the left shoulder, upper scapular on the left side, left arm particularly at the lateral aspect and is being extremely difficult putting on her clothes which has been frustrating.  In addition, she has not been able to shower due to the weakness her symptoms has not improved with ibuprofen, muscle rub or ice. She has also experienced dragging of her feet. She denies any precipitating strenuous activity that would have caused her onset of symptoms.  She denies fevers, chills, dizziness,  lightheadedness, syncope, abdominal pain, nausea, vomiting, vision changes, urinary or bladder incontinence although she does mention of a history of urinary urgency.  Currently, she rates her pain as 6/10 and describes her symptoms as nagging/aching/spasm pain.  She states that she does have iron deficiency anemia however has not taking iron supplements due to side effects.  She also reports of longstanding history of daily migraine headache dating back to about 10 years ago with associated worsening with photophobia and sound.  Sometimes, she would experience nausea and on Thursday she had a brief episode of nausea.    Lab Orders     Resp Panel by RT-PCR (Flu A&B, Covid) Nasopharyngeal Swab     Ethanol     Protime-INR     APTT     CBC     Differential     Comprehensive metabolic panel     Urine rapid drug screen (hosp performed)     Urinalysis, Routine w reflex microscopic     Hemoglobin A1c     Sedimentation rate     Lyme disease, western blot     RPR     HIV Antibody (routine testing w rflx)     CBC     Lipid panel     Comprehensive metabolic panel     Ferritin     Iron and TIBC     Calcium, ionized     Blood gas, arterial     I-Stat beta hCG  blood, ED   Meds:  *Eliquis 5 mg daily   Allergies: Allergies as of 12/29/2020 - Review Complete 12/29/2020  Allergen Reaction Noted  . Hydrocodone  05/07/2018  . Oxycodone Other (See Comments) 04/30/2018   Past Medical History:  Diagnosis Date  . Acute meniscal tear of left knee   . Anemia   . 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 Migraines   . Mild asthma   . OA (osteoarthritis)    bilateral knees  . SBO (small bowel obstruction) ([place]) 09/2020    Family History: Grandmother with hypertension and diabetes.  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  She lives at home with her husband and a 52 year old son.  She states of marital issues which has been causing her a lot of stress.  She denies alcohol, tobacco or illicit drug use.  Review of Systems: A complete ROS was negative except as per HPI.   Physical Exam: Blood pressure 104/64, pulse (!) 55, temperature 98.5 F (36.9 C), temperature source Oral, resp. rate 16, height '5\' 4"'  (1.626 m), weight 99.8 kg, SpO2 100 %. Physical Exam Vitals and nursing note reviewed.  Constitutional:      General: She is not in acute distress.    Appearance: She is obese. She is not toxic-appearing.  HENT:     Head: Normocephalic and atraumatic.     Mouth/Throat:     Mouth: Mucous membranes are dry.  Eyes:     General: No scleral icterus.    Conjunctiva/sclera: Conjunctivae normal.  Cardiovascular:     Rate and Rhythm: Normal rate.     Heart sounds: Normal heart sounds. No murmur heard.   Pulmonary:     Effort: Pulmonary effort is normal.     Breath sounds: No wheezing or rales.  Abdominal:     General: Bowel sounds are normal.     Tenderness: There is no abdominal tenderness.  Musculoskeletal:        General: Tenderness (Left shoulder, left upper scapula) present.     Cervical back: Neck supple.  Skin:    General: Skin is warm.  Neurological:     Mental Status: She is alert.     Cranial Nerves: No dysarthria or facial asymmetry.     Sensory: Sensation is intact.     Deep Tendon Reflexes: Babinski sign absent on the right side. Babinski sign absent on the left side.     Reflex Scores:      Tricep reflexes are 2+ on the right side and 2+ on the left side.      Bicep reflexes are 2+ on the right side and 2+ on the left side.      Patellar reflexes are 2+ on the right side and 2+ on the left side.    Comments: Neurologic exam: Mental status: A&O Cranial  Nerves: II: PERRL III, IV, VI: Extra-occular motions intact bilaterally V, VII: Face symmetric, sensation intact in all 3 divisions  VIII: hearing normal  IX, X: palate rises symmetrically XI: Head turn and shoulder shrug normal bilaterally  XII: tongue midline  Motor: 5/5 at the RUE and LUE. 4/5 at LUE  and RUE Deep Tendon Reflexes: 2+ symmetric   Gait: Unable to access  Sensory: Light touch intact and symmetric bilaterally  Coordination: There is no dysmetria on finger-to-nose. Psychiatric: Somewhat flat affect      EKG: personally reviewed my interpretation is SR  Assessment & Plan by Problem: Principal Problem:   Left-sided weakness Active Problems:   DVT, lower extremity, recurrent (HCC)    #Left-sided weakness (Cervicalgia vs brachial plexus injury, less likely TIA) She presents with sudden onset weakness, paresthesia and pain of her left arm and leg.  So far, CT head and MRI brain have ruled out acute CVA.  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  Another possibility of her sudden onset weakness could be conversion disorder (functional neurologic symptom disorder) given her ongoing marital stressors at home.  She is worried about divorcing her husband and the strain it would cause on the her 55 year old son. Complex migraine is certainly a possibility as well.  -Follow-up MRI left shoulder with brachial plexus protocol -Hemoglobin A1c, ESR, Lyme, RPR, HIV, lipid panel  -PT/OT -Outpatient EMG/nerve conduction in 3 to 4 weeks   #Recurrent DVT of lower extremities #Suspicion for inherited thrombophilia It appears that she began experiencing lower extremity DVTs during her pregnancy.  Per her last visit with  hematology, it appears that some of the DVTs were provoked and others were unprovoked.  Her most recent DVT was July 2019 2021 when she was discovered to have a right lower extremity DVT.  Hypercoagulable panel has been suspicious for decrease protein S and protein C activity.  I did consider ordering antiphospholipid antibody, beta-2 glycoprotein and anticardiolipin however given the nature of venous clots and not arterial clots, I will hold off on this for now. Patient states that she takes it daily due to not not liking a lot of pills.  -Hold off on further hypercoagulable panel -Continue Eliquis 5 mg twice daily    #Chronic microcytic anemia #Iron deficiency anemia I do not see iron labs performed nor is she on iron supplements. Patient states she has been off iron pills for a while due to side effects -Follow-up iron panel, ferritin, reticulocytes -Start Nu-iron 865HQ daily   #Metabolic alkalosis Suspecting underlying OSA -Follow-up ABG to access for pCO2 and pH   #Hypocalcemia - Follow-up ionized calcium   ION:GEXBMWU diet VTE XLK:GMWNUUV CODE STATUS:FULL  Prior to Admission Living Arrangement: Home  Anticipated Discharge Location: Home Barriers to Discharge: Management of weakness  Dispo: Admit patient to Observation with expected length of stay less than 2 midnights.  Signed: Jean Rosenthal, MD 12/30/2020, 9:33 AM  Pager: [phone] Internal Medicine Teaching Service After 5pm on weekdays and 1pm on weekends: On Call pager: [phone]

## 2020-12-30 NOTE — ED Notes (Signed)
Report given to Lydia, RN.

## 2020-12-30 NOTE — ED Notes (Signed)
Transport Tele tracking placed

## 2020-12-30 NOTE — Evaluation (Signed)
Physical Therapy Evaluation Patient Details Name: Yesenia Wheeler MRN: [id number] DOB: [birth year] Today's Date: 12/30/2020   History of Present Illness  Pt is a 50 y/o female admitted secondary to LUE and LLE weakness. MRI negative for acute abnormality. Workup pending. PMH includes migraines.  Clinical Impression  Pt admitted secondary to problem above with deficits below. Pt reporting pain in L shoulder, numbness in LLE and weakness in LLE. Reports she felt like LLE was dragging during short distance gait. Requiring min guard and HHA for safety. Educated about use of cane and recommendations for outpatient PT. Will continue to follow acutely to maximize functional mobility independence and safety.     Follow Up Recommendations Outpatient PT    Equipment Recommendations  Cane    Recommendations for Other Services       Precautions / Restrictions Precautions Precautions: Fall Restrictions Weight Bearing Restrictions: No      Mobility  Bed Mobility Overal bed mobility: Needs Assistance Bed Mobility: Supine to Sit;Sit to Supine     Supine to sit: Supervision Sit to supine: Supervision   General bed mobility comments: Supervision for safety.    Transfers Overall transfer level: Needs assistance Equipment used: 1 person hand held assist Transfers: Sit to/from Stand Sit to Stand: Min guard         General transfer comment: Min guard for safety.  Ambulation/Gait Ambulation/Gait assistance: Min guard Gait Distance (Feet): 5 Feet Assistive device: 1 person hand held assist Gait Pattern/deviations: Decreased weight shift to left;Step-through pattern Gait velocity: Decreased   General Gait Details: Pt reporting LLE felt weaker during mobility and felt like it was dragging. No buckling noted. Min guard for safety. Discussed using a cane for increased support during mobility.  Stairs            Wheelchair Mobility    Modified Rankin (Stroke Patients Only)        Balance Overall balance assessment: Needs assistance Sitting-balance support: No upper extremity supported;Feet supported Sitting balance-Leahy Scale: Good     Standing balance support: Single extremity supported;No upper extremity supported;During functional activity Standing balance-Leahy Scale: Fair Standing balance comment: Able to maintain static standing without UE support                             Pertinent Vitals/Pain Pain Assessment: 0-10 Pain Score: 6  Pain Location: L shoulder Pain Descriptors / Indicators: Aching;Grimacing Pain Intervention(s): Limited activity within patient's tolerance;Monitored during session    Home Living Family/patient expects to be discharged to:: Private residence Living Arrangements: Spouse/significant other;Children Available Help at Discharge: Family Type of Home: House Home Access: Stairs to enter Entrance Stairs-Rails: Psychiatric nurse of Steps: 3 Home Layout: One level Home Equipment: None      Prior Function Level of Independence: Independent         Comments: Works as a Nutritional therapist   Dominant Hand: Right    Extremity/Trunk Assessment   Upper Extremity Assessment Upper Extremity Assessment: Defer to OT evaluation (Reporting pain in L shoulder)    Lower Extremity Assessment Lower Extremity Assessment: LLE deficits/detail LLE Deficits / Details: Was able to perform heel slide and ankle pump. Reports decreased sensation    Cervical / Trunk Assessment Cervical / Trunk Assessment: Normal  Communication   Communication: No difficulties  Cognition Arousal/Alertness: Awake/alert Behavior During Therapy: WFL for tasks assessed/performed Overall Cognitive Status: Within Functional Limits for tasks assessed  General Comments      Exercises     Assessment/Plan    PT Assessment Patient needs continued PT  services  PT Problem List Decreased strength;Decreased balance;Decreased mobility;Decreased activity tolerance;Decreased knowledge of use of DME       PT Treatment Interventions DME instruction;Gait training;Stair training;Functional mobility training;Therapeutic activities;Therapeutic exercise;Balance training;Patient/family education    PT Goals (Current goals can be found in the Care Plan section)  Acute Rehab PT Goals Patient Stated Goal: to figure out what is causing symptoms PT Goal Formulation: With patient Time For Goal Achievement: 01/13/21 Potential to Achieve Goals: Good    Frequency Min 3X/week   Barriers to discharge        Co-evaluation               AM-PAC PT "6 Clicks" Mobility  Outcome Measure Help needed turning from your back to your side while in a flat bed without using bedrails?: None Help needed moving from lying on your back to sitting on the side of a flat bed without using bedrails?: None Help needed moving to and from a bed to a chair (including a wheelchair)?: A Little Help needed standing up from a chair using your arms (e.g., wheelchair or bedside chair)?: A Little Help needed to walk in hospital room?: A Little Help needed climbing 3-5 steps with a railing? : A Little 6 Click Score: 20    End of Session Equipment Utilized During Treatment: Gait belt Activity Tolerance: Patient tolerated treatment well Patient left: in bed;with call bell/phone within reach (on stretcher in ED) Nurse Communication: Mobility status PT Visit Diagnosis: Unsteadiness on feet (R26.81);Muscle weakness (generalized) (M62.81)    Time: 5732-2025 PT Time Calculation (min) (ACUTE ONLY): 16 min   Charges:   PT Evaluation $PT Eval Low Complexity: 1 Low          Lou Miner, DPT  Acute Rehabilitation Services  Pager: [phone] Office: [phone]   Yesenia Wheeler 12/30/2020, 4:56 PM

## 2020-12-30 NOTE — ED Notes (Signed)
Pt still in mri unable to obtain vitals

## 2020-12-30 NOTE — ED Notes (Signed)
Pt at MRI

## 2020-12-30 NOTE — ED Provider Notes (Signed)
Care handoff received from Quincy Carnes, PA-C at shift change.  Please see previous provider note for full details of visit.  In short patient presented to the ER with left-sided weakness.  Work-up so far has included CT head, MRI of the brain and cervical spine with and without contrast.  No acute strokes found but does show disc disease without cord compression.  Neurology has been consulted and is currently evaluating the patient and will provide further recommendations.  I have been asked to follow-up on those recommendations for disposition. Physical Exam  BP 126/87 (BP Location: Right Arm)   Pulse 68   Temp 98.5 F (36.9 C) (Oral)   Resp (!) 21   Ht 5\' 4"  (1.626 m)   Wt 99.8 kg   SpO2 100%   BMI 37.76 kg/m   Physical Exam Constitutional:      General: She is not in acute distress.    Appearance: Normal appearance. She is well-developed. She is not ill-appearing or diaphoretic.  HENT:     Head: Normocephalic and atraumatic.  Eyes:     General: Vision grossly intact. Gaze aligned appropriately.     Pupils: Pupils are equal, round, and reactive to light.  Neck:     Trachea: Trachea and phonation normal.  Pulmonary:     Effort: Pulmonary effort is normal. No respiratory distress.  Musculoskeletal:        General: Normal range of motion.     Cervical back: Normal range of motion.  Skin:    General: Skin is warm and dry.  Neurological:     Mental Status: She is alert.     GCS: GCS eye subscore is 4. GCS verbal subscore is 5. GCS motor subscore is 6.  Psychiatric:        Behavior: Behavior normal.     ED Course/Procedures     Procedures  MDM  7:15 AM: Consult with neurology, Dr. Curly Shores.  Dr. Curly Shores advises that they plan to admit this for MRI and further work-up, possibly brachial plexus pathology.  I have been asked to consult medicine team for admission.  7:20 AM: Patient reevaluated she is resting comfortably no acute distress has just ambulated independently back  from the bathroom.  She states understanding of care plan and has no questions at this time.  Consult with internal medicine team.  Patient was accepted for admission.  Note: Portions of this report may have been transcribed using voice recognition software. Every effort was made to ensure accuracy; however, inadvertent computerized transcription errors may still be present.        Deliah Boston, PA-C 12/30/20 [id number]    Dorie Rank, MD 12/31/20 [phone]

## 2020-12-30 NOTE — Consult Note (Signed)
Neurology Consultation Reason for Consult: Left arm/leg weakness Requesting Physician: Lorre Munroe   CC: Left arm/leg weakness  History is obtained from: Patient and chart review   HPI: Yesenia Wheeler is a 50 y.o. female with a past medical history significant for left knee meniscal tear, bilateral knee osteoarthritis, chronic daily migraine headaches, presenting with left-sided arm pain and weakness as well as sensation of leg heaviness.  She was in her normal state of health until Wednesday night when she was falling asleep (5/18) she had some tingling that started in the back of her arm and radiated down her fingers as well as involving her knee and foot on the left side which subsequently felt heavy.  This lasted about 30 minutes and then she was able to fall asleep.  However when she woke up Thursday morning she noted she had to use her right arm to help move her left arm.  She was having throbbing pain in her shoulder that would last 15 to 20 minutes at a time and when her shoulder was throbbing she would have a feeling of associated leg heaviness.  This did not improve with ibuprofen or home remedy herbal rub on her shoulders.  She would feel like the arm would go into spasms from the pain which would improve temporarily with repositioning but then recur.  She began to have trouble using her arms for example when she tried to pick up branches fell and splattered everywhere and she found her pets pulls too heavy to lift with the left hand.  On review of systems she does note that she has had a chronic daily headache for at least 10 years that is migrainous in nature (exacerbated by lights and sound, associated with nausea though typically no emesis, improves with rest and darkness, associated with blurred vision at times but no transient loss of vision, no gradual loss of peripheral vision, pulsating in quality).  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  She also reports some marital issues.  She has chronic poor sleep sleeping only 2 to 3 hours at night and drinking 3 cups of coffee daily.  She also reports occasional increased heart rate which she reports improves with straining.  She has had some urinary incontinence (urgency related) for the past 3 months however this has been stable and she denies any loss of sensation when she wipes.  She was last admitted in February 2022 for small bowel obstruction but her symptoms have improved and her bowel movements are now regular.  ROS: All other review of systems was negative except as noted in the HPI.   Past Medical History:  Diagnosis Date  . Acute meniscal tear of left knee   . Anemia   . 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 Migraines   . Mild asthma   . OA (osteoarthritis)    bilateral knees  . SBO (small bowel obstruction) ([place]) 09/2020   Past Surgical History:  Procedure Laterality Date  . BREAST LUMPECTOMY WITH RADIOACTIVE SEED LOCALIZATION Left 10/23/2017   Procedure: BREAST LUMPECTOMY WITH RADIOACTIVE SEED LOCALIZATION;  Surgeon: Erroll Luna, MD;  Location: [place];  Service: General;  Laterality: Left;  . BREAST REDUCTION SURGERY Bilateral 10/23/2017   Procedure: LEFT BREAST ONCOPLASTIC REDUCTION, RIGHT BREAST REDUCTION;  Surgeon: Irene Limbo, MD;  Location: [place];  Service: Plastics;  Laterality: Bilateral;  . CESAREAN SECTION  2011  . GASTRIC BYPASS  2001  . KNEE ARTHROSCOPY W/ MENISCECTOMY Right 2007  . KNEE ARTHROSCOPY WITH MEDIAL MENISECTOMY Left 08/30/2017   Procedure: KNEE ARTHROSCOPY WITH PARTIAL MEDIAL MENISECTOMY;  Surgeon: Nicholes Stairs,  MD;  Location: [facility];  Service: Orthopedics;  Laterality: Left;  60 mins  . LAPAROSCOPIC CHOLECYSTECTOMY  2001  . SHOULDER ARTHROSCOPY Left 2008   spur     Family History  Problem Relation Age of Onset  . Cancer Father        oral  She denies any family history of autoimmune disease  Social History:  reports that she quit smoking about 3 years ago. Her smoking use included cigarettes. She quit after 6.00 years of use. She has never used smokeless tobacco. She reports current alcohol use. She reports that she does not use drugs.  Exam: Current vital signs: BP 126/87 (BP Location: Right Arm)   Pulse 68   Temp 98.5 F (36.9 C) (Oral)   Resp (!) 21   Ht '5\' 4"'  (1.626 m)   Wt 99.8 kg   SpO2 100%   BMI 37.76 kg/m  Vital signs in last 24 hours: Temp:  [98.5 F (36.9 C)] 98.5 F (36.9 C) (05/18 2053) Pulse Rate:  [63-78] 68 (05/19 0653) Resp:  [13-21] 21 (05/19 0653) BP: (110-132)/(64-87) 126/87 (05/19 0653) SpO2:  [98 %-100 %] 100 % (05/19 0653) Weight:  [99.8 kg] 99.8 kg (05/18 2135)   Physical Exam  Constitutional: Appears well-developed and well-nourished.  Psych: Affect appropriate to situation, tired but cooperative and pleasant Eyes: No scleral injection HENT: No oropharyngeal obstruction.  MSK: no joint deformities.  Cardiovascular: Normal rate and regular rhythm.  Respiratory: Effort normal, non-labored breathing GI: Soft.  No distension. There is no tenderness.  Skin: Warm dry and intact visible skin  Neuro: Mental Status: Patient is awake, alert, oriented to person, place, month, year, and situation. Patient is able to give a clear and coherent history. No signs of aphasia or neglect Cranial Nerves: II: Visual Fields are full. Pupils are equal, round, and reactive to light.   III,IV, VI: EOMI without ptosis or diploplia.  V: Facial sensation is symmetric to temperature VII: Facial movement is symmetric.  VIII: hearing is intact to  voice X: Uvula elevates symmetrically XI: Shoulder shrug is symmetric. XII: tongue is midline without atrophy or fasciculations.  Motor: Tone is normal. Bulk is normal.  She is extremely limited movement of the left upper extremity secondary to shoulder pain.  When she moves it with the right upper extremity she moves it carefully and gingerly in a way that minimizes pain.  She has 5/5 strength throughout the right upper extremity and right leg, left leg is at least 4/5 throughout with some element of giveaway weakness and she does note pain limitation secondary to her chronic knee issues Sensory: Sensation is symmetric to light touch and temperature in the arms and legs.  There is no loss of cold sensation throughout the left upper extremity Deep Tendon Reflexes: 2+ in the right biceps but cannot elicit in the left biceps.  She had difficulty relaxing for patellar testing. Plantars: Toes are mute bilaterally. Cerebellar: Finger-to-nose is intact  in the right upper extremity, pain limited in the left upper extremity   I have reviewed labs in epic and the results pertinent to this consultation are: CMP essentially unremarkable with creatinine 0.96, CBC notable for microcytic anemia with elevated RDW Beta-hCG negative  I have personally reviewed the images obtained: MRI brain with and without contrast without acute intracranial process MRI C-spine with and without contrast with some degenerative changes particularly of the neuroforamina, but no cord signal change or significant stenosis   Impression: Patient's examination was notable for severe throbbing pain in the shoulder which limits her ability to comfortably move it.  Given negative MRI C-spine and brain imaging, suspected that her leg symptoms represent decompensation of her chronic left knee pain in the setting of her acute arm pain, and is not truly related.  Suspect that the acute process is localized to her brachial plexus given  extreme tenderness.  Parsonage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 is a possibility but the diagnosis of exclusion.  In addition to the MRI brain and C-spine pain on neurology recommendation, we will also obtain MRI brachial plexus protocol with and without contrast.  She will need an outpatient EMG nerve conduction study and basic labs as below  Recommendations: - MRI brachial plexus w/ and w/o (MRI left shoulder) - Hemoglobin A1c, ESR, Lyme, RPR, HIV to evaluate for potentially treatable causes  - Physical therapy / OT evaluation - Outpatient EMG/nerve conduction study to be performed 3 to 4 weeks after symptom onset  Lesleigh Noe MD-PhD [facility] [phone] Available 7 PM to 7 AM, outside of these hours please call Neurologist on call as listed on Amion.

## 2020-12-30 NOTE — ED Notes (Signed)
Pt still in mri 

## 2020-12-30 NOTE — ED Notes (Signed)
Report attempted x2

## 2020-12-30 NOTE — ED Notes (Signed)
Patient transported to MRI 

## 2020-12-31 ENCOUNTER — Other Ambulatory Visit (HOSPITAL_COMMUNITY): Payer: Self-pay

## 2020-12-31 DIAGNOSIS — G545 Neuralgic amyotrophy: Secondary | ICD-10-CM

## 2020-12-31 DIAGNOSIS — I82402 Acute embolism and thrombosis of unspecified deep veins of left lower extremity: Secondary | ICD-10-CM

## 2020-12-31 LAB — COMPREHENSIVE METABOLIC PANEL
ALT: 11 U/L (ref 0–44)
AST: 16 U/L (ref 15–41)
Albumin: 2.8 g/dL — ABNORMAL LOW (ref 3.5–5.0)
Alkaline Phosphatase: 90 U/L (ref 38–126)
Anion gap: 7 (ref 5–15)
BUN: 11 mg/dL (ref 6–20)
CO2: 19 mmol/L — ABNORMAL LOW (ref 22–32)
Calcium: 7.9 mg/dL — ABNORMAL LOW (ref 8.9–10.3)
Chloride: 113 mmol/L — ABNORMAL HIGH (ref 98–111)
Creatinine, Ser: 0.86 mg/dL (ref 0.44–1.00)
GFR, Estimated: >60 mL/min (ref >60)
Glucose, Bld: 103 mg/dL — ABNORMAL HIGH (ref 70–99)
Potassium: 3.8 mmol/L (ref 3.5–5.1)
Sodium: 139 mmol/L (ref 135–145)
Total Bilirubin: 0.5 mg/dL (ref 0.3–1.2)
Total Protein: 5.6 g/dL — ABNORMAL LOW (ref 6.5–8.1)

## 2020-12-31 LAB — RPR: RPR Ser Ql: NONREACTIVE

## 2020-12-31 LAB — CBC
HCT: 25 % — ABNORMAL LOW (ref 36.0–46.0)
Hemoglobin: 8 g/dL — ABNORMAL LOW (ref 12.0–15.0)
MCH: 23.3 pg — ABNORMAL LOW (ref 26.0–34.0)
MCHC: 32 g/dL (ref 30.0–36.0)
MCV: 72.7 fL — ABNORMAL LOW (ref 80.0–100.0)
Platelets: 241 10*3/uL (ref 150–400)
RBC: 3.44 MIL/uL — ABNORMAL LOW (ref 3.87–5.11)
RDW: 18.4 % — ABNORMAL HIGH (ref 11.5–15.5)
WBC: 5.5 10*3/uL (ref 4.0–10.5)
nRBC: 0 % (ref 0.0–0.2)

## 2020-12-31 LAB — TSH: TSH: 1.439 u[IU]/mL (ref 0.350–4.500)

## 2020-12-31 LAB — CALCIUM, IONIZED: Calcium, Ionized, Serum: 4.7 mg/dL (ref 4.5–5.6)

## 2020-12-31 MED ORDER — NAPROXEN 500 MG PO TABS
500.0000 mg | ORAL_TABLET | Freq: Two times a day (BID) | ORAL | 0 refills | Status: DC | PRN
  Filled 2020-12-31 (×2): qty 60, 30d supply, fill #0

## 2020-12-31 MED ORDER — METHYLPREDNISOLONE 4 MG PO TBPK
4.0000 mg | ORAL_TABLET | ORAL | Status: DC
  Filled 2020-12-31: qty 21

## 2020-12-31 MED ORDER — METHYLPREDNISOLONE 4 MG PO TBPK: 8.0000 mg | ORAL_TABLET | Freq: Every evening | ORAL | Status: DC

## 2020-12-31 MED ORDER — SODIUM CHLORIDE 0.9 % IV SOLN
510.0000 mg | Freq: Once | INTRAVENOUS | Status: AC
  Administered 2020-12-31: 510 mg via INTRAVENOUS
  Filled 2020-12-31: qty 17

## 2020-12-31 MED ORDER — METHYLPREDNISOLONE 4 MG PO TBPK
ORAL_TABLET | ORAL | 0 refills | Status: DC
  Filled 2020-12-31 (×2): qty 21, 6d supply, fill #0

## 2020-12-31 MED ORDER — POLY-IRON 150 FORTE 150-25-1 MG-MCG-MG PO CAPS
150.0000 mg | ORAL_CAPSULE | Freq: Every day | ORAL | 0 refills | Status: DC
  Filled 2020-12-31: qty 30, 30d supply, fill #0

## 2020-12-31 MED ORDER — SODIUM CHLORIDE 0.9 % IV SOLN: 510.0000 mg | Freq: Once | INTRAVENOUS | Status: DC

## 2020-12-31 MED ORDER — ELIQUIS 5 MG PO TABS
5.0000 mg | ORAL_TABLET | Freq: Two times a day (BID) | ORAL | 0 refills | Status: DC
  Filled 2020-12-31 (×2): qty 60, 30d supply, fill #0

## 2020-12-31 MED ORDER — METHYLPREDNISOLONE 4 MG PO TBPK: 4.0000 mg | ORAL_TABLET | Freq: Four times a day (QID) | ORAL | Status: DC

## 2020-12-31 MED ORDER — NAPROXEN 250 MG PO TABS
500.0000 mg | ORAL_TABLET | Freq: Two times a day (BID) | ORAL | Status: DC | PRN
  Filled 2020-12-31: qty 2

## 2020-12-31 MED ORDER — METHYLPREDNISOLONE 4 MG PO TBPK: 4.0000 mg | ORAL_TABLET | Freq: Three times a day (TID) | ORAL | Status: DC

## 2020-12-31 MED ORDER — METHYLPREDNISOLONE 4 MG PO TBPK: 4.0000 mg | ORAL_TABLET | ORAL | Status: DC

## 2020-12-31 MED ORDER — BACLOFEN 5 MG PO TABS
5.0000 mg | ORAL_TABLET | Freq: Three times a day (TID) | ORAL | 0 refills | Status: AC
  Filled 2020-12-31 (×2): qty 9, 3d supply, fill #0

## 2020-12-31 MED ORDER — METHYLPREDNISOLONE 4 MG PO TBPK
8.0000 mg | ORAL_TABLET | Freq: Every morning | ORAL | Status: DC
  Filled 2020-12-31: qty 21

## 2020-12-31 MED ORDER — METHYLPREDNISOLONE 4 MG PO TBPK
ORAL_TABLET | ORAL | 0 refills | Status: AC
  Filled 2020-12-31: qty 21, 6d supply, fill #0

## 2020-12-31 NOTE — Discharge Summary (Signed)
Name: Yesenia Wheeler MRN: [id number] DOB: June 24, 1971 50 y.o. PCP: Pcp, No  Date of Admission: 12/29/2020  9:27 PM Date of Discharge: 12/31/2020 12/31/20 Attending Physician: No att. 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   Baclofen 5 MG Tabs Take 5 mg by mouth 3 (three) times daily for 3 days.   Eliquis 5 MG Tabs tablet Generic drug: apixaban Take 1 tablet (5 mg total) by mouth 2 (two) times daily. What changed: when to take this   methylPREDNISolone 4 MG Tbpk tablet Commonly known as: MEDROL DOSEPAK Follow directions on pack.   methylPREDNISolone 4 MG Tbpk tablet Commonly known as: MEDROL DOSEPAK Take 6 tablets (24 mg total) by mouth daily with breakfast for 1 day, THEN 5 tablets (20 mg total) daily with breakfast for 1 day, THEN 4 tablets (16 mg total) daily with breakfast for 1 day, THEN 3 tablets (12 mg total) daily with breakfast for 1 day, THEN 2 tablets (8 mg total) daily with breakfast for 1 day, THEN 1 tablet (4 mg total) daily with breakfast for 1 day. Can split doses between breakfast and lunch if easier to tolerate. Avoid taking late in the day.. Start taking on: Dec 31, 2020   naproxen 500 MG tablet Commonly known as: NAPROSYN Take 1 tablet (500 mg total) by mouth 2 (two) times daily as needed for mild pain or moderate pain.   Poly-Iron 150 Forte 150-0.025-1 MG Caps Generic drug:  Iron Polysacch [id number] Take 150 mg by mouth daily.       Disposition and follow-up:   Yesenia Wheeler was discharged from [facility] in Stable condition.  At the hospital follow up visit please address:  1.  Follow up: --Outpatient EMG nerve conduction in about 4 to 6 weeks. --Follow-up with the [facility] neurology clinic in the next 4 to 6 weeks. --Follow up with IMTS Clinic at [facility] Please make appointment in 1 week.  --Follow up with IMTS Clinic for FNA for thyroid nodule.  2.  Labs / imaging needed at time of follow-up: None.  3.  Pending labs/ test needing follow-up: Lyme's disease  Follow-up Appointments:  Follow-up Information    [facility].   Specialty: Rehabilitation Contact information: [address] [place] [id number] [place] [address] [place]. Schedule an appointment as soon as possible for a visit in 4 week(s).   Contact information: [address]     [address] [place]  [id number] [place]. Call in 1 week(s).   Contact information: 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 Yesenia Wheeler presented with paresthesia, weakness, heaviness  of her left arm shoulder which suddenly radiated to her fingers. CT head and MRI brain have ruled out acute CVA. 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 MRI brachia plexus- Subtle ill-defined edema in the supraspinatus and infraspinatus muscles may reflect early denervation, potentially reflecting acute  idiopathic brachial neuritis (Parsonage Turner syndrome). PT recommended Outpatient PT.  ESR wnl, HIV negative, Ethanol level <10, Urinalysis Hazy and ketones of 5 but no leucocytes. Urine negative for drugs. Lyme RPR pending. A1c 5.5, LDL 111. Neurology recommended outpatient follow-up in 4 to 6 weeks and EMG nerve conduction studies at follow-up.  Patient was started on Medrol Dosepak, naproxen and recommended for outpatient OT and PT.   #Recurrent DVT of lower extremities #Suspicion for inherited thrombophilia she began experiencing lower extremity DVTs during her pregnancy. Most recent DVT in July 2019 2021. Hypercoagulable panel has been suspicious for decrease protein S and protein C activity.   Patient was continued on Eliquis 5 mg twice daily   #Chronic microcytic anemia #Iron deficiency anemia  Iron panel shows iron of 26, U IBC 437, TIBC 463, saturation ratio 6, ferritin 3 PT got 1 unit of Ferriheme and started Nu-iron 150m daily at discharge.  #Hypocalcemia Calcium 7.9.  Ionized calcium 1.22  Thyroid nodule Incidental Finding on MRI,  2.2 cm heterogeneously enhancing right thyroid nodule. TSH normal at 1.43 Recommended for outpatient FNA biopsy.  Subjective on day of discharge: Patient seen at bedside.  Still feels weak on left arm and leg.  No nausea, vomiting, dizziness.  Reports some numbness on left side.  Explained the results of MRI and told to follow-up with neurology and PCP.  Recommended to continue OT and PT on outpatient basis.  Patient states she needs a note for work. Discharge Exam:   BP 109/63 (BP Location: Right Arm)   Pulse 73   Temp 98 F (36.7 C) (Oral)   Resp 18   Ht '5\' 4"'  (1.626 m)   Wt 99.8 kg   SpO2 100%   BMI 37.76 kg/m  Discharge exam: Physical Exam Vitals and nursing note reviewed.  Constitutional:      General: She is not in acute distress.    Appearance: Normal appearance. She is obese. She is not ill-appearing, toxic-appearing or  diaphoretic.  HENT:     Head: Normocephalic and atraumatic.     Mouth/Throat:     Mouth: Mucous membranes are moist.  Cardiovascular:     Rate and Rhythm: Normal rate and regular rhythm.     Pulses: Normal pulses.  Pulmonary:     Effort: Pulmonary effort is normal.     Breath sounds: Normal breath sounds.  Musculoskeletal:        General: Tenderness present.     Comments: ROM restricted in Left UE  Skin:    General: Skin is warm and dry.  Neurological:     Mental Status: She is alert and oriented to person, place, and time.     Cranial Nerves: No cranial nerve deficit.     Sensory: Sensory deficit present.     Motor: Weakness present.     Coordination: Coordination normal.     Comments: Neurological: CN II-XII grossly intact. Strength LLE 4/5, Rt LL - 5/5. LUE 3+/5. RUE 5/5 Limited range of motion due to pain in LUE.    Pertinent  Labs, Studies, and Procedures:  CT Head Wo Contrast  Result Date: 12/30/2020 CLINICAL DATA:  Acute left-sided weakness EXAM: CT HEAD WITHOUT CONTRAST TECHNIQUE: Contiguous axial images were obtained from the base of the skull through the vertex without intravenous contrast. COMPARISON:  None. FINDINGS: Brain: Normal anatomic configuration. No abnormal intra or extra-axial mass lesion or fluid collection. No abnormal mass effect or midline shift. No evidence of acute intracranial hemorrhage or infarct. Ventricular size is normal. Cerebellum unremarkable. Vascular: Unremarkable Skull: Intact Sinuses/Orbits: Paranasal sinuses are clear. Orbits are unremarkable. Other: Mastoid air cells and middle ear cavities are clear. IMPRESSION: No acute intracranial abnormality.  Normal examination. Electronically Signed   By: Fidela Salisbury MD   On: 12/30/2020 01:37   MR Brain W and Wo Contrast  Result Date: 12/30/2020 CLINICAL DATA:  50 year old female with left side weakness since May 11th. On Eliquis for DVT. Unrevealing noncontrast head CT. EXAM: MRI HEAD WITHOUT AND  WITH CONTRAST TECHNIQUE: Multiplanar, multiecho pulse sequences of the brain and surrounding structures were obtained without and with intravenous contrast. CONTRAST:  60m GADAVIST GADOBUTROL 1 MMOL/ML IV SOLN COMPARISON:  Noncontrast head CT 0110 hours. FINDINGS: Brain: Cerebral volume is within normal limits. No restricted diffusion to suggest acute infarction. No midline shift, mass effect, evidence of mass lesion, ventriculomegaly, extra-axial collection or acute intracranial hemorrhage. Cervicomedullary junction and pituitary are within normal limits. GPearline Cablesand white matter signal is within normal limits throughout the brain. No encephalomalacia identified. No definite chronic cerebral blood products. No abnormal enhancement identified.  No dural thickening. Vascular: Major intracranial vascular flow voids are preserved. Mild intracranial artery tortuosity. The major dural venous sinuses are enhancing and appear to be patent. Skull and upper cervical spine: Cervical spine detailed separately. Visualized bone marrow signal is within normal limits. Sinuses/Orbits: Negative orbits. Trace paranasal sinus mucosal thickening. No sinus fluid level. Other: Trace mastoid air cell fluid greater on the left, likely postinflammatory. There are several small mucous retention cysts in the midline nasopharynx (benign). Other visible internal auditory structures appear grossly normal. Visible scalp and face appear negative. IMPRESSION: Normal MRI appearance of the brain.  No explanation for weakness. Electronically Signed   By: HGenevie AnnM.D.   On: 12/30/2020 04:52   MR CHEST W WO CONTRAST  Result Date: 12/30/2020 CLINICAL DATA:  Left shoulder pain and arm weakness. EXAM: MR CHEST WITH AND WITHOUT CONTRAST TECHNIQUE: Multiplanar, multisequence MR imaging of the left brachial plexus was performed before and after the administration of intravenous contrast. CONTRAST:  136mGADAVIST GADOBUTROL 1 MMOL/ML IV SOLN COMPARISON:  MRI  cervical spine from same day. CTA chest dated November 25, 2016. FINDINGS: Spinal cord Normal caliber and signal of visualized spinal cord. Brachial plexus Roots: Normal. Trunks: Normal. Divisions: Normal. Cords: Normal. Branches: Normal. Muscles and tendons Subtle diffuse ill-defined edema in the left supraspinatus and infraspinatus muscles (series 6, image 22). No atrophy. Bones Cervical spine: Please see separate MRI cervical spine report from same day. C7 transverse processes: Normal. Marrow signal: Normal. Other findings 2.2 cm heterogeneously enhancing right thyroid nodule (series 12, image 19), not clearly seen in 2018. Mildly enlarged right axillary lymph node measuring 1.3 cm in short axis, unchanged since 2018 and likely reactive. IMPRESSION: 1. Subtle ill-defined edema in the supraspinatus and infraspinatus muscles may reflect early denervation, potentially reflecting acute idiopathic brachial neuritis (Parsonage Turner syndrome). 2. Normal MRI appearance of the left brachial plexus. 3. 2.2 cm heterogeneously enhancing right thyroid nodule. Recommend thyroid US[place]aref: J  Am Coll Radiol. 2015 Feb;12(2): 143-50). Electronically Signed   By: Titus Dubin M.D.   On: 12/30/2020 17:27   MR CERVICAL SPINE W WO CONTRAST  Result Date: 12/30/2020 CLINICAL DATA:  50 year old female with left side weakness since May 11th. On Eliquis for DVT. Unrevealing noncontrast head CT. EXAM: MRI CERVICAL SPINE WITHOUT AND WITH CONTRAST TECHNIQUE: Multiplanar and multiecho pulse sequences of the cervical spine, to include the craniocervical junction and cervicothoracic junction, were obtained without and with intravenous contrast. CONTRAST:  11m GADAVIST GADOBUTROL 1 MMOL/ML IV SOLN in conjunction with contrast enhanced imaging of the brain reported separately. COMPARISON:  Brain MRI today reported separately. FINDINGS: Alignment: Straightening and mild reversal of cervical lordosis. No significant spondylolisthesis.  Vertebrae: No marrow edema or evidence of acute osseous abnormality. Mildly heterogeneous background spinal bone marrow signal similar to that in the skull. Cord: Normal. Fairly capacious spinal canal. No abnormal intradural enhancement. No dural thickening. Posterior Fossa, vertebral arteries, paraspinal tissues: Cervicomedullary junction is within normal limits. Preserved major vascular flow voids in the neck. Negative visible neck soft tissues and lung apices. Disc levels: C2-C3:  Negative. C3-C4:  Negative. C4-C5: Disc space loss with circumferential disc bulge and endplate spurring most affecting the foramina. Narrows ventral CSF space but no spinal stenosis. Mild left and moderate to severe right C5 foraminal stenosis. C5-C6: Disc space loss with circumferential disc bulge and endplate spurring most affecting the foramina. Effaced ventral CSF space but no significant spinal stenosis. Moderate to severe left and mild-to-moderate right C6 foraminal stenosis. C6-C7: Disc space loss with circumferential disc bulge and endplate spurring. No spinal stenosis. Mild to moderate left and moderate to severe right C7 foraminal stenosis. C7-T1:  Mild facet hypertrophy.  No stenosis. No visible upper thoracic spinal stenosis. IMPRESSION: 1. Normal cervical spinal cord, and no significant spinal stenosis despite multilevel disc and endplate degeneration. 2. 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 Electronically Signed   By: HGenevie AnnM.D.   On: 12/30/2020 04:57     Discharge Instructions: Discharge Instructions    Ambulatory referral to Occupational Therapy   Complete by: As directed    Ambulatory referral to Physical Therapy   Complete by: As directed    Diet - low sodium heart healthy   Complete by: As directed    Increase activity slowly   Complete by: As directed     Dear SDione Wheeler   Thank you for  letting [facility] in your care! In this section, you will find a brief hospital admission summary of why you were admitted to the hospital, what happened during your admission, your diagnosis/diagnoses, and recommended follow up.   You were admitted because you were experiencing weakness and pain.   Your testing revealed weakness of your Left arm and leg.   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  You were treated with Pain medications, muscle relaxants and prescribed oral steroids at discharge. You were given  IV iron for anemia.  You were also seen by PT and OT. They recommended Outpatient OT and PT.   Your symptoms improved and you were discharged from the hospital for meeting this goal.   [facility] for follow up and referral for EMG nerve conduction study.   POST-HOSPITAL & CARE INSTRUCTIONS - Outpatient EMG nerve conduction in about 4 to 6 weeks. --Follow-up with  the [facility] neurology clinic in the next 4 to 6 weeks. --Follow up with IMTS Clinic at [facility]. Please make appointment in 1 week.  --Follow up with IMTS Clinic for FNA for thyroid nodule. --Please take medications according to discharge packet.  --Please take Medrol dose pack according to package instructions.  -- Please let PCP/Specialists know of any changes in medications that were made.  --Please see medications section of this packet for any medication changes.   Thank you for choosing [facility]! Take care and be well!  Internal Medicine Teaching Service Inpatient Team [facility]  [place], [place] [address] [phone]   Signed: Armando Reichert, MD 01/01/2021, 2:38 PM   Pager: [phone]

## 2020-12-31 NOTE — TOC Initial Note (Addendum)
Transition of Care [facility]) - Initial/Assessment Note    Patient Details  Name: Yesenia Wheeler MRN: [id number] Date of Birth: [birth year]  Transition of Care [facility]) CM/SW Contact:    Marilu Favre, RN Phone Number: 12/31/2020, 11:33 AM  Clinical Narrative:                  Patient from home with husband.   Currently does not have a PCP. Per patient Internal Medicine at [facility] will be PCP. Secure chatted Dr Rosita Kea , awaiting response.   Patient in agreement with OP PT and OT at [facility] , will place order and place on AVS. Confirmed phone number [phone].   Patient asking about emergency medicaid , emailed financial counselor. Also explain to patient she can go to Department of Social Services. Patient asking about charity care at hospital. Explained when patient gets her bill she can call number on bill to discuss   PT recommending cane. Secure chatted PT to see if she needs single point or quad cane. They will see patient again to determine.    Secure chatted Dr Rosita Kea to see if discharge prescriptions are known if they can be sent to [place] today prior to 4 pm.   Patient voiced understanding   1250  Ordered single point cane from [place] with [place].  Expected Discharge Plan: Home/Self Care     Patient Goals and CMS Choice Patient states their goals for this hospitalization and ongoing recovery are:: to return to home CMS Medicare.gov Compare Post Acute Care list provided to:: Patient    Expected Discharge Plan and Services Expected Discharge Plan: Home/Self Care   Discharge Planning Services: CM Consult   Living arrangements for the past 2 months: Single Family Home                 DME Arranged: Cane         HH Arranged: NA          Prior Living Arrangements/Services Living arrangements for the past 2 months: Single Family Home Lives with:: Spouse Patient language and need for interpreter reviewed:: Yes Do you feel safe going back to the  place where you live?: Yes      Need for Family Participation in Patient Care: Yes (Comment) Care giver support system in place?: Yes (comment)   Criminal Activity/Legal Involvement Pertinent to Current Situation/Hospitalization: No - Comment as needed  Activities of Daily Living      Permission Sought/Granted   Permission granted to share information with : No              Emotional Assessment Appearance:: Appears stated age Attitude/Demeanor/Rapport: Engaged Affect (typically observed): Accepting Orientation: : Oriented to Self,Oriented to Place,Oriented to  Time,Oriented to Situation Alcohol / Substance Use: Not Applicable    Admission diagnosis:  Left-sided weakness [R53.1] Patient Active Problem List   Diagnosis Date Noted  . Left-sided weakness 12/30/2020  . DVT, lower extremity, recurrent ([place]) 12/30/2020  . SBO (small bowel obstruction) ([place]) 09/28/2020  . Left breast mass 10/23/2017  . Acute medial meniscus tear of left knee 08/30/2017  . BV (bacterial vaginosis) 06/23/2015  . Tinea pedis 06/21/2015  . Screening-pulmonary TB 03/15/2015  . Obesity (BMI 30-39.9) 05/26/2014  . Asthma, chronic 05/25/2014  . Migraine 05/25/2014  . Chronic back pain 05/25/2014   PCP:  Pcp, No Pharmacy:   [facility] [place], [place] - [address] [place] [address]  Phone: [phone] Fax: [phone]  [id number] - [place], [place] [address] [place] [address] Phone: [phone] Fax: [phone]     Social Determinants of Health (SDOH) Interventions    Readmission Risk Interventions No flowsheet data found.

## 2020-12-31 NOTE — Progress Notes (Signed)
Physical Therapy Treatment Patient Details Name: Yesenia Wheeler MRN: [id number] DOB: 02-07-1971 Today's Date: 12/31/2020    History of Present Illness Pt is a 50 y/o female admitted secondary to LUE and LLE weakness. 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 2. Normal MRI appearance of the left brachial plexus. MRI of cervical spine shows 1. Normal cervical spinal cord, and no significant spinal stenosis despite multilevel disc and endplate degeneration.; C4-C5 through C6-C7 degenerative neural foraminal stenosis which is moderate to severe at the right C5, left C6, and right C7 nerve levels. PMH includes: SBO, OA, asthma, migraines, h/o DVT, s/p gastric bypass    PT Comments    The pt was eager to participate with PT this session, focus on education regarding use of cane, stair navigation, and safety awareness for balance during self-care tasks. The pt was able to demo good functional endurance with use of SPC, and demos good safety with stair navigation using single rail. The pt was educated in exercises for LLE to maintain functional ROM and strength, continue to recommend skilled therapies on OP basis to further progress functional strength, dynamic stability, and reduce reliance on AD.     Follow Up Recommendations  Outpatient PT     Equipment Recommendations  Cane    Recommendations for Other Services       Precautions / Restrictions Precautions Precautions: Fall Restrictions Weight Bearing Restrictions: No    Mobility  Bed Mobility Overal bed mobility: Modified Independent Bed Mobility: Supine to Sit;Sit to Supine           General bed mobility comments: no assist needed, from flat bed     Transfers Overall transfer level: Modified independent Equipment used: Straight cane;None Transfers: Sit to/from Stand Sit to Stand: Modified independent (Device/Increase time) Stand pivot transfers: Modified independent (Device/Increase time)       General transfer comment: supervision during session, but safe with and without use of AD. able to control well with LE strength, cued to increase wt on LLE, good use of UE for safety  Ambulation/Gait Ambulation/Gait assistance: Min guard Gait Distance (Feet): 200 Feet Assistive device: Straight cane Gait Pattern/deviations: Step-through pattern;Decreased weight shift to left Gait velocity: 0.3 m/s Gait velocity interpretation: <1.31 ft/sec, indicative of household ambulator General Gait Details: pt able to advance LLE with good toe clearance, but reduced compared to RLE, no instances of LLE knee buckling, used 3-point gait pattern with SPC   Stairs Stairs: Yes Stairs assistance: Min guard Stair Management: One rail Right;Step to pattern;Forwards Number of Stairs: 3 General stair comments: pt instructed to complete with RLE leading, increased attention to LLE toe clearance and position on step. good stability with single UE supported on rail, discussed use of additional person to manage cane   Wheelchair Mobility    Modified Rankin (Stroke Patients Only)       Balance Overall balance assessment: Needs assistance Sitting-balance support: No upper extremity supported;Feet supported Sitting balance-Leahy Scale: Good     Standing balance support: Single extremity supported;No upper extremity supported;During functional activity Standing balance-Leahy Scale: Fair Standing balance comment: Able to maintain static standing without UE support                            Cognition Arousal/Alertness: Awake/alert Behavior  During Therapy: WFL for tasks assessed/performed Overall Cognitive Status: Within Functional  Limits for tasks assessed                                        Exercises Other Exercises Other Exercises: L foot heel-toe raises, x5 Other Exercises: L foot spelling alphabet with foot movement to increase active ROM and strength in muli-direction and all planes of movement. Other Exercises: sit-stand from EOB with reduced use of UE and addition of partial ROM squat.    General Comments General comments (skin integrity, edema, etc.): pt educated on safe techniques to complete hygiene and self-care tasks with greater independence, pt demos good safety awareness, and agreeable to all education      Pertinent Vitals/Pain Pain Assessment: Faces Pain Score: 3  Faces Pain Scale: Hurts little more Pain Location: L shoulder Pain Descriptors / Indicators: Sore Pain Intervention(s): Monitored during session;Limited activity within patient's tolerance (pt shoulder had been taped by OT)     PT Goals (current goals can now be found in the care plan section) Acute Rehab PT Goals Patient Stated Goal: to be able to complete toilet hygiene independently PT Goal Formulation: With patient Time For Goal Achievement: 01/13/21 Potential to Achieve Goals: Good Progress towards PT goals: Progressing toward goals    Frequency    Min 3X/week      PT Plan Current plan remains appropriate    Co-evaluation              AM-PAC PT "6 Clicks" Mobility   Outcome Measure  Help needed turning from your back to your side while in a flat bed without using bedrails?: None Help needed moving from lying on your back to sitting on the side of a flat bed without using bedrails?: None Help needed moving to and from a bed to a chair (including a wheelchair)?: A Little Help needed standing up from a chair using your arms (e.g., wheelchair or bedside chair)?: A Little Help needed to walk in hospital room?: A Little Help needed climbing 3-5 steps with a railing? : A Little 6 Click Score:  20    End of Session Equipment Utilized During Treatment: Gait belt Activity Tolerance: Patient tolerated treatment well Patient left: in bed;with call bell/phone within reach Nurse Communication: Mobility status PT Visit Diagnosis: Unsteadiness on feet (R26.81);Muscle weakness (generalized) (M62.81)     Time: 7353-2992 PT Time Calculation (min) (ACUTE ONLY): 31 min  Charges:  $Gait Training: 8-22 mins $Therapeutic Exercise: 8-22 mins                     Karma Ganja, PT, DPT   Acute Rehabilitation Department Pager #: [phone]   Otho Bellows 12/31/2020, 2:48 PM

## 2020-12-31 NOTE — Progress Notes (Signed)
Neurology Progress Note   S:// No change overnight.  Continues to report left shoulder pain and weakness. MRI of the brachial plexus suggestive of subtle ill-defined edema in the supraspinatus and infraspinatus muscles which may reflect early denervation and potentially acute idiopathic brachial neuritis/Parsonage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.  Normal MRI appearance of the left brachial plexus.  O:// Current vital signs: BP 108/75 (BP Location: Right Wrist)   Pulse 69   Temp 98.7 F (37.1 C) (Oral)   Resp 18   Ht 5\' 4"  (1.626 m)   Wt 99.8 kg   SpO2 99%   BMI 37.76 kg/m  Vital signs in last 24 hours: Temp:  [98.5 F (36.9 C)-98.9 F (37.2 C)] 98.7 F (37.1 C) (05/20 0517) Pulse Rate:  [69-94] 69 (05/20 0517) Resp:  [17-18] 18 (05/20 0517) BP: (103-122)/(57-83) 108/75 (05/20 0517) SpO2:  [99 %-100 %] 99 % (05/20 0517) GENERAL: Awake, alert in NAD HEENT: - Normocephalic and atraumatic, dry mm, no LN++, no Thyromegally LUNGS - Clear to auscultation bilaterally with no wheezes CV - S1S2 RRR, no m/r/g, equal pulses bilaterally. ABDOMEN - Soft, nontender, nondistended with normoactive BS Ext: warm, well perfused, intact peripheral pulses, no edema Neurological examination awake alert oriented x3 No aphasia No dysarthria Cranial nerves II to XII intact Motor exam: Normal tone and bulk.  Extremely limited movement of the left upper extremity secondary to pain.  Left upper extremity at the shoulder is at least 3/5, biceps and triceps are 4/5 brachioradialis is 4/5, grip strength is 4/5.  Otherwise nearly symmetric 5/5. Sensation intact to light touch all over DTRs are at least 1+-difficult to elicit the left upper extremity reflexes and she also does not relax upper extremities and is anxious Cerebellar: No dysmetria  Medications  Current Facility-Administered Medications:  .  albuterol (PROVENTIL) (2.5 MG/3ML) 0.083% nebulizer solution 3 mL, 3 mL, Inhalation, Q6H PRN, Agyei, Obed K, MD .   apixaban (ELIQUIS) tablet 5 mg, 5 mg, Oral, BID, Agyei, Obed K, MD, 5 mg at 12/31/20 0924 .  baclofen (LIORESAL) tablet 5 mg, 5 mg, Oral, TID, Agyei, Obed K, MD, 5 mg at 12/31/20 0924 .  iron polysaccharides (NIFEREX) capsule 150 mg, 150 mg, Oral, Daily, Agyei, Obed K, MD, 150 mg at 12/31/20 0924 .  polyethylene glycol (MIRALAX / GLYCOLAX) packet 17 g, 17 g, Oral, Daily PRN, Jean Rosenthal, MD Labs CBC    Component Value Date/Time   WBC 5.5 12/31/2020 0207   RBC 3.44 (L) 12/31/2020 0207   HGB 8.0 (L) 12/31/2020 0207   HGB 8.8 (L) 02/06/2007 1036   HCT 25.0 (L) 12/31/2020 0207   HCT 27.0 (L) 02/06/2007 1036   PLT 241 12/31/2020 0207   PLT 271 02/06/2007 1036   MCV 72.7 (L) 12/31/2020 0207   MCV 70.0 (L) 02/06/2007 1036   MCH 23.3 (L) 12/31/2020 0207   MCHC 32.0 12/31/2020 0207   RDW 18.4 (H) 12/31/2020 0207   RDW 19.1 (H) 02/06/2007 1036   LYMPHSABS 3.1 12/29/2020 2201   LYMPHSABS 2.0 02/06/2007 1036   MONOABS 0.4 12/29/2020 2201   MONOABS 0.2 02/06/2007 1036   EOSABS 0.2 12/29/2020 2201   EOSABS 0.3 02/06/2007 1036   BASOSABS 0.0 12/29/2020 2201   BASOSABS 0.1 02/06/2007 1036    CMP     Component Value Date/Time   NA 139 12/31/2020 0207   K 3.8 12/31/2020 0207   CL 113 (H) 12/31/2020 0207   CO2 19 (L) 12/31/2020 0207   GLUCOSE 103 (H) 12/31/2020 0207  BUN 11 12/31/2020 0207   CREATININE 0.86 12/31/2020 0207   CREATININE 1.02 03/15/2015 1554   CALCIUM 7.9 (L) 12/31/2020 0207   PROT 5.6 (L) 12/31/2020 0207   ALBUMIN 2.8 (L) 12/31/2020 0207   AST 16 12/31/2020 0207   ALT 11 12/31/2020 0207   ALKPHOS 90 12/31/2020 0207   BILITOT 0.5 12/31/2020 0207   GFRNONAA >60 12/31/2020 0207   GFRAA >60 07/02/2019 0953  A1c 5.5 Sed rate 14 HIV nonreactive   Imaging I have reviewed images in epic and the results pertinent to this consultation are: MR brain with and without contrast with no stroke or evidence of demyelination MRI of the C-spine with contrast with some  degenerative changes particularly in the neuroforamina but no spinal cord change or significant stenosis. MRI of the left brachial plexus with supraspinatus and infraspinatus subtle ill-defined edema concerning for sequel of denervation or brachial neuritis-Parsonage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.  Assessment:  Left upper extremity weakness with negative brain and C-spine imaging and brachial plexus imaging concerning for Parsonage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-like changes with ill-defined edema of the supraspinatus and infraspinatus muscles. Usual etiologies are believed to be herpetic, diabetic amyotrophy or inflammatory. Outpatient EMG nerve conduction in about 4 to 6 weeks would be helpful to further evaluate No set treatment is available but please see recommendations below  Recommendations: No guidelines on what the best treatment might be but steroids p.o. might be helpful. Can try Medrol Dosepak NSAIDs have been also reported to be of use-ibuprofen has not worked for her.  Can try Naprosyn. Physical therapy and Occupational Therapy have been the mainstay of treatment-which I recommend she get both inpatient and outpatient.  Follow-up with the [facility] neurology clinic in the next 4 to 6 weeks.  Plan relayed to the primary team resident physician  -- Amie [place], MD Neurologist [facility] Pager: [phone]

## 2020-12-31 NOTE — Progress Notes (Signed)
Occupational Therapy Progress Note  Kinesiotape applied to Lt shoulder with reduction of pain to 3/10.   She was also instructed in HEP for Lt UE and was able to return demonstration - written instruction provided.    Recommend OPOT.    12/31/20 1200  OT Visit Information  Last OT Received On 12/31/20  Assistance Needed +1  History of Present Illness Pt is a 50 y/o female admitted secondary to LUE and LLE weakness. 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 2. Normal MRI appearance of the left brachial plexus. MRI of cervical spine shows 1. Normal cervical spinal cord, and no significant spinal stenosis despite multilevel disc and endplate degeneration.; C4-C5 through C6-C7 degenerative neural foraminal stenosis which is moderate to severe at the right C5, left C6, and right C7 nerve levels. PMH includes: SBO, OA, asthma, migraines, h/o DVT, s/p gastric bypass  Precautions  Precautions Fall  Pain Assessment  Pain Assessment 0-10  Pain Score 3  Pain Location L shoulder  Pain Descriptors / Indicators Aching;Spasm;Squeezing  Pain Intervention(s) Monitored during session;Repositioned;Other (comment) (kinesiotape)  Cognition  Arousal/Alertness Awake/alert  Behavior During Therapy WFL for tasks assessed/performed  Overall Cognitive Status Within Functional Limits for tasks assessed  Upper Extremity Assessment  Upper Extremity Assessment LUE deficits/detail  LUE Deficits / Details Pt reports pain Lt shoulder and scapula that radiates into upper arm.  PROM WFL.  Strength grossly at least 2/5 - pt is very guarded with movement due to pain making strength assessment difficult.   elbow, forearm strength at least 3/5, hand at least 4-/5   LUE Coordination decreased gross motor  Lower Extremity Assessment  Lower Extremity Assessment Defer to PT evaluation  ADL  Overall ADL's  Modified independent  Bed Mobility  Bed Mobility Supine to Sit;Sit to Supine  Balance  Overall balance assessment Needs assistance  Sitting-balance support No upper extremity supported;Feet supported  Sitting balance-Leahy Scale Good  Standing balance support Single extremity supported;No upper extremity supported;During functional activity  Standing balance-Leahy Scale Fair  Transfers  Overall transfer level Modified independent  General transfer comment moves slowly and is very guarded  Other Exercises  Other Exercises Kinesiotape applied to Lt shoulder for brachial plexus neuralgia with pt reporting pain 3/10 afterwards.  She was instructed in purpose of taping, how to remove taping and how to tape herself if it is successful in relieving her pain  Other Exercises Pt instructed in HEP for shoulder via [place].  She was able to return demonstration of exercises in supine  OT - End of Session  Activity Tolerance Patient tolerated treatment well  Patient left in bed;with call bell/phone within reach  Nurse Communication Mobility status  OT Assessment/Plan  OT Plan Discharge plan remains appropriate  OT Visit Diagnosis Pain  Pain - Right/Left Left  Pain - part of body Shoulder  OT Frequency (ACUTE ONLY) Min 2X/week  Follow Up Recommendations Outpatient OT;Supervision - Intermittent  OT Equipment None recommended by OT  AM-PAC OT "6 Clicks" Daily Activity Outcome Measure (Version 2)  Help from another person eating meals? 4  Help from another person taking care of personal grooming? 4  Help from another person toileting, which includes using toliet, bedpan, or urinal? 4  Help from another person bathing (including washing, rinsing, drying)? 4  Help from another  person to put on and taking off regular upper body clothing? 4  Help from another  person to put on and taking off regular lower body clothing? 4  6 Click Score 24  OT Goal Progression  Progress towards OT goals Progressing toward goals  OT Time Calculation  OT Start Time (ACUTE ONLY) 1154  OT Stop Time (ACUTE ONLY) 1216  OT Time Calculation (min) 22 min  OT General Charges  $OT Visit 1 Visit  OT Treatments  $Therapeutic Exercise 8-22 mins  Nilsa Nutting., OTR/L Acute Rehabilitation Services Pager [phone] Office [phone]

## 2020-12-31 NOTE — Progress Notes (Signed)
Patient has ordered for discharge. Given discharge instructions and paper as well. Iv removed. All belongings given to the patient. Patient is going home with her husband.

## 2020-12-31 NOTE — Evaluation (Signed)
Occupational Therapy Evaluation Patient Details Name: Yesenia Wheeler MRN: [id number] DOB: 01-11-1971 Today's Date: 12/31/2020    History of Present Illness Pt is a 50 y/o female admitted secondary to LUE and LLE weakness. 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 2. Normal MRI appearance of the left brachial plexus. MRI of cervical spine shows 1. Normal cervical spinal cord, and no significant spinal stenosis despite multilevel disc and endplate degeneration.; C4-C5 through C6-C7 degenerative neural foraminal stenosis which is moderate to severe at the right C5, left C6, and right C7 nerve levels. PMH includes: SBO, OA, asthma, migraines, h/o DVT, s/p gastric bypass   Clinical Impression   Pt admitted with above. She demonstrates the below listed deficits and will benefit from continued OT to maximize safety and independence with BADLs.  Pt presents to OT with pain LT shoulder which significantly impacts Lt UE function and her ability to perform ADLs and IADLs especially those tasks that require bil. UE function.  Soft tissue mobilization performed with little to no improvement in pain.  Discussed compensatory strategies with her as well as need to reduce work responsibilities as pt currently works 10+ hours per day and works 7 days/week.  Will provide her with HEP for Lt UE, and will attempt kinesiotape Lt shoulder in hopes of reducing pain.  Recommend OPOT at discharge.  Also recommend counseling as she is under significant stress, and Lt UE pain and limitations add to that stress.  Will follow acutely.       Follow Up Recommendations  Outpatient OT;Supervision - Intermittent    Equipment Recommendations  None recommended by OT     Recommendations for Other Services       Precautions / Restrictions Precautions Precautions: Fall Restrictions Weight Bearing Restrictions: No      Mobility Bed Mobility Overal bed mobility: Modified Independent                  Transfers Overall transfer level: Needs assistance   Transfers: Sit to/from Stand;Stand Pivot Transfers Sit to Stand: Supervision Stand pivot transfers: Supervision            Balance                                           ADL either performed or assessed with clinical judgement   ADL Overall ADL's : Modified independent                                       General ADL Comments: Pt is able to perform ADLs mod I, but reports she struggles to wash Rt axilla, her back and posterior peri area.  She also reports difficulty performing work related skills such as lifting LEs to perform edema wraps and performing IADLs such as lifting dog water bowls, etc     Vision Baseline Vision/History: No visual deficits Patient Visual Report: No change from baseline       Perception Perception Perception Tested?: Yes   [place] tested?: Within functional limits    Pertinent Vitals/Pain Pain Assessment: 0-10 Pain Score: 6  Pain Location: L shoulder  Pain Descriptors / Indicators: Aching;Spasm;Squeezing Pain Intervention(s): Monitored during session;Limited activity within patient's tolerance;Repositioned     Hand Dominance Right   Extremity/Trunk Assessment Upper Extremity Assessment Upper Extremity Assessment: LUE deficits/detail LUE Deficits / Details: Pt reports pain Lt shoulder and scapula that radiates into upper arm.  PROM WFL.  Strength grossly at least 2/5 - pt is very guarded with movement due to pain making strength assessment difficult.   elbow, forearm strength at least 3/5, hand at least 4-/5 LUE Coordination: decreased gross motor   Lower Extremity Assessment Lower  Extremity Assessment: Defer to PT evaluation   Cervical / Trunk Assessment Cervical / Trunk Assessment: Normal   Communication Communication Communication: No difficulties   Cognition Arousal/Alertness: Awake/alert Behavior During Therapy: WFL for tasks assessed/performed Overall Cognitive Status: Within Functional Limits for tasks assessed                                     General Comments  Discussed options or compensation with ADLs, the recommendation for OPOT, the need to reduce work responsibilities.  Also discussed asking family for assist with IADLs and that she may benefit from counseling services as she reports significant stressors, and new onset of limitations due to Lt UE add further to stress levels.    Exercises Exercises: Other exercises Other Exercises Other Exercises: soft tissue mobilization performed Lt shoulder with little to no change in pain level   Shoulder Instructions      Home Living Family/patient expects to be discharged to:: Private residence Living Arrangements: Spouse/significant other;Children Available Help at Discharge: Family Type of Home: House Home Access: Stairs to enter Technical brewer of Steps: 3 Entrance Stairs-Rails: Right;Left Home Layout: One level     Bathroom Shower/Tub: Teacher, early years/pre: Standard     Home Equipment: None          Prior Functioning/Environment Level of Independence: Independent        Comments: works as a Nurse, 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[employer], 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        OT Problem List: Decreased strength;Decreased range of motion;Decreased activity tolerance;Impaired balance (sitting and/or standing);Decreased coordination;Decreased knowledge of use of DME or AE;Impaired UE functional use;Pain      OT Treatment/Interventions: Self-care/ADL training;Therapeutic exercise;Neuromuscular education;DME and/or AE instruction;Therapeutic activities;Manual  therapy;Patient/family education    OT Goals(Current goals can be found in the care plan section) Acute Rehab OT Goals Patient Stated Goal: to have less pain and to get back to normal OT Goal Formulation: With patient Time For Goal Achievement: 01/07/21 Potential to Achieve Goals: Good ADL Goals Pt/caregiver will Perform Home Exercise Program: With written HEP provided;Independently Additional ADL Goal #1: Pt will be independent with 3 stress reduction strategies  OT Frequency: Min 2X/week   Barriers to D/C: Decreased caregiver support  spouse works       Co-evaluation              AM-PAC OT "6 Clicks" Daily Activity     Outcome Measure Help from another person eating meals?: None Help from another person taking care of personal grooming?: None Help from another person toileting, which includes using toliet, bedpan, or urinal?: None Help from another person bathing (including washing, rinsing, drying)?: None Help from another person to put on and taking off regular upper body clothing?: None Help from another person to put on and taking off  regular lower body clothing?: None 6 Click Score: 24   End of Session Nurse Communication: Mobility status  Activity Tolerance: Patient limited by pain Patient left: in bed;with call bell/phone within reach  OT Visit Diagnosis: Pain Pain - Right/Left: Left Pain - part of body: Shoulder                Time: 6256-3893 OT Time Calculation (min): 47 min Charges:  OT General Charges $OT Visit: 1 Visit OT Evaluation $OT Eval Moderate Complexity: 1 Mod OT Treatments $Self Care/Home Management : 23-37 mins  Nilsa Nutting., OTR/L Acute Rehabilitation Services Pager [phone] Office [phone]   Lucille Passy M 12/31/2020, 11:06 AM

## 2020-12-31 NOTE — Progress Notes (Addendum)
   Subjective: No overnight events.   Seen at bedside. Still feels slightly week. No nausea, vomiting.   Objective:  Vital signs in last 24 hours: Vitals:   12/30/20 1800 12/30/20 2005 12/31/20 0053 12/31/20 0517  BP: 122/77 110/69 (!) 103/57 108/75  Pulse: 82 74 79 69  Resp: $Remo'17 18 18 18  'mdYqD$ Temp: 98.5 F (36.9 C) 98.9 F (37.2 C) 98.5 F (36.9 C) 98.7 F (37.1 C)  TempSrc: Oral Oral Oral Oral  SpO2: 100% 100% 100% 99%  Weight:      Height:          Physical Exam Constitutional: Laying in bed on L side. no acute distress Cardiovascular: regular rate and rhythm, normal heart sounds Pulmonary: effort normal, lungs clear to ascultation bilaterally Abdominal: flat, nontender, no rebound tenderness, bowel sounds normal Skin: warm and dry Neurological: CN II-XII grossly intact. Strength LLE 4/5, Rt LL - 5/5. LUE 3+/5. RUE 5/5 Limited range of motion due to pain in LUE.  Psychiatric: normal mood and affect  Assessment/Plan: Yesenia Wheeler is a 50 y.o. 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 CT head and MRI brain have ruled out acute CVA.  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  MRI brachia plexus- Subtle ill-defined edema in the supraspinatus and infraspinatus muscles may reflect early denervation, potentially reflecting acute  idiopathic brachial neuritis (Parsonage Turner syndrome). PT recommended Outpatient PT, ESR wnl, HIV negative, Ethanol level <10, Urinalysis Hazy and ketones of 5 but no leucocytes. Urine negative for drugs. Lyme RPR pending. A1c 5.5, Lipid panel-  HDL 40, LDL 111 -Neurology consulted. Appreciate Recs.  - F/U Lyme, RPR -OT recommends OPOT - Outpatient EMG/nerve conduction in 3 to 4 weeks -F/U with Neurology in 4-6 weeks. -Start Medrol dose pack 4 mg. -Naprosyn 500 mg Bid for pain. -IM Clinic for Follow up in 1 week.    #Recurrent DVT of lower extremities #Suspicion for inherited thrombophilia -Continue Eliquis 5 mg twice daily    #Chronic microcytic anemia #Iron deficiency anemia Iron labs showing IDA - Feraheme today, second dose in OP.  #Hypocalcemia Calcium 7.9 - Follow-up ionized calcium- pending  Thyroid nodule Incidental Finding on MRI, 2.2 cm heterogeneously enhancing right thyroid nodule -TSH wnl -Consider USG thyroid in the OP setting.   Diet:  Regular IVF:  None VTE:  On Eliquis  Prior to Admission Living Arrangement:  Home Anticipated Discharge Location: Home  Barriers to Discharge:  Medical workup Dispo: Anticipated discharge today.   Armando Reichert, MD 12/31/2020, 5:47 AM Pager: [phone] After 5pm on weekdays and 1pm on weekends: On Call pager [phone]

## 2020-12-31 NOTE — Discharge Instructions (Signed)
Dear Yesenia Wheeler,   Thank you for letting us participate in your care! In this section, you will find a brief hospital admission summary of why you were admitted to the hospital, what happened during your admission, your diagnosis/diagnoses, and recommended follow up.   You were admitted because you were experiencing weakness and pain.   Your testing revealed weakness of your Left arm and leg.   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  You were treated with Pain medications, muscle relaxants and prescribed oral steroids at discharge. You were given  IV iron for anemia.  You were also seen by PT and OT. They recommended Outpatient OT and PT.   Your symptoms improved and you were discharged from the hospital for meeting this goal.   Follow up with [facility] neurology clinic in 4-6 weeks for follow up and referral for EMG nerve conduction study.   POST-HOSPITAL & CARE INSTRUCTIONS - Outpatient EMG nerve conduction in about 4 to 6 weeks. --Follow-up with the [facility] neurology clinic in the next 4 to 6 weeks. --Follow up with IMTS Clinic at [facility]. Please make appointment in 1 week.  --Follow up with IMTS Clinic for FNA for thyroid nodule. --Please take medications according to discharge packet.  --Please take Medrol dose pack according to package instructions.  -- Please let PCP/Specialists know of any changes in medications that were made.  --Please see medications section of this packet for any medication changes.   DOCTOR'S APPOINTMENTS & FOLLOW UP No future appointments.   Thank you for choosing [facility]! Take care and be well!  [facility]  [address] [place], [place] [address] [phone]

## 2021-01-01 ENCOUNTER — Encounter: Payer: Self-pay | Admitting: Internal Medicine

## 2021-01-01 DIAGNOSIS — G545 Neuralgic amyotrophy: Secondary | ICD-10-CM | POA: Insufficient documentation

## 2021-01-03 ENCOUNTER — Telehealth: Payer: Self-pay | Admitting: Internal Medicine

## 2021-01-03 NOTE — Telephone Encounter (Signed)
TOC HFU PER DR WINTERS TO establish care with [place] 01/13/2021 @ 8:45AM WITH DR Wynetta Emery

## 2021-01-04 ENCOUNTER — Telehealth (HOSPITAL_COMMUNITY): Payer: Self-pay | Admitting: Pharmacist

## 2021-01-04 ENCOUNTER — Other Ambulatory Visit (HOSPITAL_COMMUNITY): Payer: Self-pay

## 2021-01-04 NOTE — Telephone Encounter (Signed)
Pharmacy Transitions of Care Follow-up Telephone Call  Date of discharge: 12/31/20 Discharge Diagnosis:Recurrent LE DVT  How have you been since you were released from the hospital? Still having a lot of pain around neck area  Medication changes made at discharge:  - START: Medrol dosepak, Naproxen, Poly-iron, Baclofen, albuterol inh  - STOPPED: Ibuprofen  - CHANGED: Eliquis 10gm daily to 5 mg BID  Medication changes verified by the patient? Y    Medication Accessibility:  [facility] [address]  Was the patient provided with refills on discharged medications? N  Have all prescriptions been transferred from [facility] to home pharmacy? N/A  Is the patient able to afford medications?  -Pt has no insurance. Per pt, she is waiting for a call from the hospital to see if she's eligible for Medicaid. Informed her that if there's  any issue to check with MD at next appt at IM if she qualifies for patient assistance.    Medication Review:   APIXABAN (ELIQUIS)  Apixaban 5mg  BID - Discussed importance of taking medication around the same time everyday  - Reviewed potential DDIs with patient  - Advised patient of medications to avoid (NSAIDs, ASA)  - Educated that Tylenol (acetaminophen) will be the preferred analgesic to prevent risk of bleeding  - Emphasized importance of monitoring for signs and symptoms of bleeding (abnormal bruising, prolonged bleeding, nose bleeds, bleeding from gums, discolored urine, black tarry stools)  - Advised patient to alert all providers of anticoagulation therapy prior to starting a new medication or having a procedure    Follow-up Appointments:  PCP Hospital f/u appt confirmed? Scheduled to go to IM clinic on 6/2 @8 :45am.  [facility] to call pt to schedule appointment regarding Parsonage Turner syndrome  If their condition worsens, is the pt aware to call PCP or go to the Emergency Dept.? Y  Final Patient  Assessment:   -Pt is still having pain around the neck. Finished taking Baclofen and Naproxen is not helping. She will discuss w/ MD what other option to use to help with the pain. -Pt verbalized understanding of Eliquis -Pt has post discharge appointment with IM and will ask for new script for Eliquis.  Garnet Sierras, PharmD

## 2021-01-07 LAB — LYME DISEASE, WESTERN BLOT
IgG P18 Ab.: ABSENT
IgG P23 Ab.: ABSENT
IgG P28 Ab.: ABSENT
IgG P30 Ab.: ABSENT
IgG P39 Ab.: ABSENT
IgG P45 Ab.: ABSENT
IgG P58 Ab.: ABSENT
IgG P66 Ab.: ABSENT
IgG P93 Ab.: ABSENT
IgM P23 Ab.: ABSENT
IgM P39 Ab.: ABSENT
IgM P41 Ab.: ABSENT
Lyme IgG Wb: NEGATIVE
Lyme IgM Wb: NEGATIVE

## 2021-01-11 NOTE — Telephone Encounter (Signed)
Transition Care Management Follow-up Telephone Call  Date of discharge and from where: Discharged 12/31/20 to home.  How have you been since you were released from the hospital? She stated no changes.  Any questions or concerns?  Stated she still has pain and spasms in her left arm.She's unable to raise her left arm;she uses her her right hand to do so. And naproxen is not helping with the pain.  Items Reviewed:  Did the pt receive and understand the discharge instructions provided? Yes   Medications obtained and verified? No   Any new allergies since your discharge? No   Dietary orders reviewed? No  Do you have support at home? Yes   Home Care and Equipment/Supplies: Were home health services ordered? No. But she has an appt with OT on Friday.   Functional Questionnaire: (I = Independent and D = Dependent) ADLs: Independent but needs help sometimes d/t her left arm.  Follow up appointments reviewed:   PCP Hospital f/u appt confirmed? Yes  Scheduled to see Dr Wynetta Emery on 01/13/21 @ 0845 AM.  Are transportation arrangements needed? No   If their condition worsens, is the pt aware to call PCP or go to the Emergency Dept.? Yes.  Was the patient provided with contact information for the PCP's office or ED? Yes.  Was to pt encouraged to call back with questions or concerns? Yes.

## 2021-01-13 ENCOUNTER — Other Ambulatory Visit: Payer: Self-pay

## 2021-01-13 ENCOUNTER — Ambulatory Visit (INDEPENDENT_AMBULATORY_CARE_PROVIDER_SITE_OTHER): Payer: Self-pay | Admitting: Student

## 2021-01-13 ENCOUNTER — Encounter: Payer: Self-pay | Admitting: Student

## 2021-01-13 ENCOUNTER — Ambulatory Visit (HOSPITAL_COMMUNITY)
Admission: RE | Admit: 2021-01-13 | Discharge: 2021-01-13 | Disposition: A | Discharge status: Home / Self Care | Payer: Self-pay | Source: Ambulatory Visit | Attending: Student in an Organized Health Care Education/Training Program | Admitting: Student in an Organized Health Care Education/Training Program

## 2021-01-13 ENCOUNTER — Other Ambulatory Visit (HOSPITAL_COMMUNITY): Payer: Self-pay

## 2021-01-13 VITALS — BP 127/81 | HR 72 | Temp 98.4°F | Wt 216.3 lb

## 2021-01-13 DIAGNOSIS — E041 Nontoxic single thyroid nodule: Secondary | ICD-10-CM | POA: Insufficient documentation

## 2021-01-13 DIAGNOSIS — J029 Acute pharyngitis, unspecified: Secondary | ICD-10-CM | POA: Insufficient documentation

## 2021-01-13 DIAGNOSIS — I82402 Acute embolism and thrombosis of unspecified deep veins of left lower extremity: Secondary | ICD-10-CM

## 2021-01-13 DIAGNOSIS — G545 Neuralgic amyotrophy: Secondary | ICD-10-CM

## 2021-01-13 DIAGNOSIS — F321 Major depressive disorder, single episode, moderate: Secondary | ICD-10-CM

## 2021-01-13 DIAGNOSIS — D509 Iron deficiency anemia, unspecified: Secondary | ICD-10-CM

## 2021-01-13 DIAGNOSIS — F32A Depression, unspecified: Secondary | ICD-10-CM | POA: Insufficient documentation

## 2021-01-13 LAB — BASIC METABOLIC PANEL
Anion gap: 8 (ref 5–15)
BUN: 6 mg/dL (ref 6–20)
CO2: 21 mmol/L — ABNORMAL LOW (ref 22–32)
Calcium: 8.8 mg/dL — ABNORMAL LOW (ref 8.9–10.3)
Chloride: 109 mmol/L (ref 98–111)
Creatinine, Ser: 0.91 mg/dL (ref 0.44–1.00)
GFR, Estimated: >60 mL/min (ref >60)
Glucose, Bld: 86 mg/dL (ref 70–99)
Potassium: 3.9 mmol/L (ref 3.5–5.1)
Sodium: 138 mmol/L (ref 135–145)

## 2021-01-13 MED ORDER — ELIQUIS 5 MG PO TABS
5.0000 mg | ORAL_TABLET | Freq: Two times a day (BID) | ORAL | 0 refills | Status: DC
  Filled 2021-01-13: qty 60, 30d supply, fill #0

## 2021-01-13 MED ORDER — CHLORASEPTIC SORE THROAT 6-10 MG MT LOZG
1.0000 | LOZENGE | OROMUCOSAL | 0 refills | Status: DC | PRN
  Filled 2021-01-13: qty 100, fill #0

## 2021-01-13 MED ORDER — POLY-IRON 150 FORTE 150-25-1 MG-MCG-MG PO CAPS
150.0000 mg | ORAL_CAPSULE | Freq: Every day | ORAL | 0 refills | Status: DC
  Filled 2021-01-13: qty 30, 30d supply, fill #0

## 2021-01-13 MED ORDER — IOHEXOL 300 MG/ML  SOLN
75.0000 mL | Freq: Once | INTRAMUSCULAR | Status: AC | PRN
  Administered 2021-01-13: 75 mL via INTRAVENOUS

## 2021-01-13 MED ORDER — SODIUM CHLORIDE (PF) 0.9 % IJ SOLN
INTRAMUSCULAR | Status: AC
  Filled 2021-01-13: qty 50

## 2021-01-13 MED ORDER — SERTRALINE HCL 25 MG PO TABS
25.0000 mg | ORAL_TABLET | Freq: Every day | ORAL | 0 refills | Status: DC
  Filled 2021-01-13: qty 30, 30d supply, fill #0

## 2021-01-13 MED ORDER — PREDNISONE 10 MG PO TABS
ORAL_TABLET | ORAL | 0 refills | Status: AC
  Filled 2021-01-13: qty 30, 12d supply, fill #0

## 2021-01-13 NOTE — Assessment & Plan Note (Addendum)
Patient is toleration poly-iron well without complaints. -Continue Poly-iron daily (refill sent to [facility] through IM Program)

## 2021-01-13 NOTE — Assessment & Plan Note (Addendum)
Patient presents as hospital follow-up for Parsonage-Turner Syndrome. Briefly, patient hospitalized from 05/19 to 05/20 for evaluation of left sided weakness with neurology workup most concerning for subtle edema of the supraspinatus and infraspinatus musculature raising concern for early denervation and Parsonage Turner syndrome. Since discharge, patient reports resolution of her left lower extremity weakness but continues to experience left upper extremity weakness, pain and spasms. She states that needs to use her right arm to lift her left arm. She has severe aching pain that radiates from her left scapula down to her left elbow and encapsulates her entire left arm. If she attempts to move her left arm, she has significant spasms. She has attempted to take naproxen without relief. Steroids and baclofen helped her, however she has run out of both. She will begin working with physical and occupational therapy tomorrow.   On examination, patient has 3/5 strength of left upper extremity proximally. She has tenderness to palpation diffusely overlying the left trapezius, scapular region and upper extremity. She has intact sensation to light touch.  Patient continues to experience weakness and pain secondary to her Parsonage-Turner Syndrome. Patient would likely benefit from another course of corticosteroids prolonged over the course of two weeks. -Prescribe prednisone taper (40mg  for 3 days, 30mg  for 3 days, 20mg  for 3 days, 10mg  for 3 days) -Discontinue naproxen -Follow-up with OT and PT tomorrow -Follow-up with neurology for EMG/nerve conduction studies -Work note provided to patient
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
## 2021-01-13 NOTE — Assessment & Plan Note (Addendum)
Patient has experienced four DVTs in the left lower extremity for which she is prescribed apixaban 5mg  twice daily. 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  -Continue apixaban 5mg  twice daily (refill sent to [facility])

## 2021-01-13 NOTE — Assessment & Plan Note (Signed)
Discussed with patient her incidental finding of a thyroid nodule during her hospitalization. Patient will require further workup with ultrasound and consideration for fine needle aspiration. -Will need to schedule ultrasound of thyroid

## 2021-01-13 NOTE — Patient Instructions (Signed)
Yesenia Wheeler,  It was a pleasure meeting you today in clinic.  For your throat pain: the imaging is concerning for enlarged tonsils which could be reacting to fighting off a virus. This should improve in the coming days with supportive care. We recommend staying hydrated and advancing your food intake as tolerated. I have placed a prescription for Chloraseptic lozenges to the [facility] which can also help with the symptoms. If your symptoms worsen or fail to improve please contact our clinic or return to the ED.  For your left arm weakness and pain: this is due to an inflammatory process therefore we recommend reducing that inflammation with steroids. I have prescribed a 12-day course of steroids to help with this inflammation. This prescription has been sent to [facility]. Please follow-up closely with physical and occupational therapy at the [facility] rehabilitation center. Also, follow-up with neurology for additional recommendations and studies.   For your depression and anxiety: I truly appreciate you opening up about these symptoms. We recommend that you establish care with Dr. Theodis Shove here in our clinic as well as starting a low dose of an SSRI to help with symptoms. This prescription has been sent to [facility].  For your history of blood clots: I have sent a refill for Eliquis to [facility]. Please contact our clinic if this medication is too expensive to pick up.  Sincerely, Dr. Paulla Dolly, MD

## 2021-01-13 NOTE — Assessment & Plan Note (Addendum)
Patient endorses two day history of severe right-sided sore throat and neck pain. She states that the pain has limited her ability to tolerate any oral intake including liquids, food and her medications. She states that her voice has become progressively more hoarse over the past two days. She denies fevers, chills, nausea, vomiting. On examination, she has bulging and erythema of the right lateral oropharynx with significant tenderness of the right neck and submandibular area. Overall, her presentation is concerning for peritonsillar abscess. -Obtain CT Soft Tissue Neck with contrast -Obtain CBC -Obtain BMP -Will need PO challenge, if unable to tolerate may warrant admission  ADDENDUM: Patient's CT Soft Tissue Neck reveals mild prominence of tonsils and adenoids which is concerning for reactive process likely viral. Patient tolerated liquids in clinic upon return. Recommended to patient to use chloraseptic lozenges for support. Patient instructed to return to clinic if symptoms worsen or fail to improve in coming days

## 2021-01-13 NOTE — Progress Notes (Signed)
CC: Establish care, hospital follow-up  HPI:  [name] is a 50 y.o. 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 Refer to problem list for charting of this encounter.  Medications: Albuterol inhaler every six hours as needed (ran out) Apixaban (Eliquis) 5mg  twice daily Poly-Iron 150mg  daily Naproxen 500mg  twice daily as needed (not taking)  Over-the-counter medications / herbal / supplements: None  Past Medical History:  Diagnosis Date  . Acute meniscal tear of left knee   . Anemia   . 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 Migraines   . Mild asthma   . OA (osteoarthritis)    bilateral knees  . SBO (small bowel obstruction) ([place]) 09/2020   Family History: Father, deceased - oral cancer Mother, living - arthritis, fibromyalgia, chronic lower back pain Brother, living - healthy  Social History: Lives with husband, two step children, son and three dogs. Prior tobacco use of 0.5ppd x 10 years, quit 2019. Occassional wine use. No drugs.  Review of Systems: Endorses weakness in left upper arm, sharp pain in scapula radiating down to elbow, spasm in arm, migraines, difficulty swallowing, pain in both knees,  Denies numbness/sensory deficit, visual disturbances, hearing loss, fevers, chills, chest pain, shortness of breath, cough, abdominal pain, nausea, vomiting, diarrhea, dark or tarry stools, pain with urination, urinary frequency,   Physical Exam:  Vitals:    01/13/21 0851  BP: 127/81  Pulse: 72  Temp: 98.4 F (36.9 C)  TempSrc: Oral  SpO2: 100%  Weight: 216 lb 4.8 oz (98.1 kg)   Physical Exam Constitutional:      General: She is not in acute distress.    Comments: Quiet, hoarse voice during interview  HENT:     Mouth/Throat:     Comments: Bulging and erythema of right lateral wall of oropharynx Eyes:     Extraocular Movements: Extraocular movements intact.     Conjunctiva/sclera: Conjunctivae normal.  Neck:     Comments: Tenderness of right neck Cardiovascular:     Rate and Rhythm: Normal rate and regular rhythm.     Pulses: Normal pulses.     Heart sounds: Normal heart sounds.  Pulmonary:     Effort: Pulmonary effort is normal.     Breath sounds: Normal breath sounds.  Abdominal:     General: Abdomen is flat. Bowel sounds are normal.     Palpations: Abdomen is soft.     Tenderness: There is no abdominal tenderness.  Musculoskeletal:     Cervical back: Normal range of motion and neck supple. Tenderness present.     Right lower leg: No edema.     Left lower leg: No edema.  Skin:    General: Skin is warm and dry.     Capillary Refill: Capillary refill takes less than 2 seconds.  Neurological:     Mental Status: She is alert and oriented to person, place, and time.     Cranial Nerves: No cranial nerve deficit.     Sensory: No sensory deficit.  Motor: Weakness present.     Gait: Gait normal.  Psychiatric:        Attention and Perception: Attention and perception normal.        Mood and Affect: Mood is depressed.        Behavior: Behavior normal.    Assessment & Plan:   See Encounters Tab for problem based charting.  Patient seen with Dr. Evette Doffing

## 2021-01-14 ENCOUNTER — Ambulatory Visit: Payer: Self-pay | Attending: Obstetrics and Gynecology | Admitting: Occupational Therapy

## 2021-01-14 ENCOUNTER — Encounter: Payer: Self-pay | Admitting: Occupational Therapy

## 2021-01-14 DIAGNOSIS — M6281 Muscle weakness (generalized): Secondary | ICD-10-CM | POA: Insufficient documentation

## 2021-01-14 DIAGNOSIS — R278 Other lack of coordination: Secondary | ICD-10-CM | POA: Insufficient documentation

## 2021-01-14 DIAGNOSIS — M25612 Stiffness of left shoulder, not elsewhere classified: Secondary | ICD-10-CM | POA: Insufficient documentation

## 2021-01-14 DIAGNOSIS — G540 Brachial plexus disorders: Secondary | ICD-10-CM | POA: Insufficient documentation

## 2021-01-14 DIAGNOSIS — M25511 Pain in right shoulder: Secondary | ICD-10-CM | POA: Insufficient documentation

## 2021-01-14 NOTE — Therapy (Signed)
OUTPATIENT OCCUPATIONAL THERAPY NEURO EVALUATION  Patient Name: Yesenia Wheeler MRN: [id number] DOB:May 02, 1971, 50 y.o., female Today's Date: 01/14/2021  PCP: Yesenia Wheeler No REFERRING PROVIDER:  Armando Reichert, MD     OT End of Session - 01/14/21 1104    Visit Number 1    Number of Visits 17    Date for OT Re-Evaluation 03/11/21    Authorization Type Self Pay - insurance kicks in July 1st, working towards [employer]    OT Start Time 1100    OT Stop Time 1145    OT Time Calculation (min) 45 min    Activity Tolerance Patient tolerated treatment well    Behavior During Therapy WFL for tasks assessed/performed           Past Medical History:  Diagnosis Date  . Acute meniscal tear of left knee   . Anemia   . 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 Migraines   . Mild asthma   . OA (osteoarthritis)    bilateral knees  . SBO (small bowel obstruction) ([facility]) 09/2020   Past Surgical History:  Procedure Laterality Date  . BREAST LUMPECTOMY WITH RADIOACTIVE SEED LOCALIZATION Left 10/23/2017   Procedure: BREAST LUMPECTOMY WITH RADIOACTIVE SEED LOCALIZATION;  Surgeon: Erroll Luna, MD;  Location: [facility];  Service: General;  Laterality: Left;  . BREAST REDUCTION SURGERY Bilateral 10/23/2017   Procedure: LEFT BREAST ONCOPLASTIC REDUCTION, RIGHT BREAST REDUCTION;  Surgeon: Irene Limbo, MD;  Location: [facility];  Service: Plastics;  Laterality: Bilateral;  . CESAREAN SECTION  2011  . GASTRIC BYPASS  2001  . KNEE ARTHROSCOPY W/ MENISCECTOMY Right 2007  . KNEE ARTHROSCOPY WITH MEDIAL MENISECTOMY Left 08/30/2017   Procedure: KNEE ARTHROSCOPY WITH PARTIAL MEDIAL MENISECTOMY;  Surgeon: Nicholes Stairs, MD;  Location[facility];  Service:  Orthopedics;  Laterality: Left;  60 mins  . LAPAROSCOPIC CHOLECYSTECTOMY  2001  . SHOULDER ARTHROSCOPY Left 2008   spur   Patient Active Problem List   Diagnosis Date Noted  . Iron deficiency anemia 01/13/2021  . Thyroid nodule 01/13/2021  . Acute sore throat 01/13/2021  . Depression 01/13/2021  . 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 DVT, lower extremity, recurrent ([facility]) 12/30/2020  . SBO (small bowel obstruction) ([facility]) 09/28/2020  . Left breast mass 10/23/2017  . Acute medial meniscus tear of left knee 08/30/2017  . BV (bacterial vaginosis) 06/23/2015  . Screening-pulmonary TB 03/15/2015  . Obesity (BMI 30-39.9) 05/26/2014  . Asthma, chronic 05/25/2014  . Migraine 05/25/2014  . Chronic back pain 05/25/2014    ONSET DATE: 12/30/20-12/31/20 - hospitalized for workup  REFERRING DIAG: R53.1 (ICD-10-CM) - Left-sided weakness    THERAPY DIAG:  Stiffness of left shoulder, not elsewhere classified - Plan: Ot plan of care cert/re-cert  Brachial plexus disorders - Plan: Ot plan of care cert/re-cert  Other lack of coordination - Plan: Ot plan of care cert/re-cert  Muscle weakness (generalized) - Plan: Ot plan of care cert/re-cert  Acute pain of right shoulder - Plan: Ot plan of care cert/re-cert  SUBJECTIVE:   SUBJECTIVE STATEMENT:  [name] is a 50 y.o. 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 Pt presents to Neuro OPOT s/p diagosis of Parsonage Turner Syndrome with significant pain reported in LUE and decreased function. 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 Yes VAS scale: 4/10 Pain location: LUE shoulder Pain orientation: Left  PAIN TYPE: aching and nagging, spasms Pain description: constant and aching  Aggravating factors: none Relieving factors: none  Are you having pain? Yes VAS scale: 10/10 Pain location: tonsils and adenoids/throat Pain orientation:NA PAIN TYPE: sore Pain description: constant and aching  Aggravating factors: none Relieving factors: none  PRECAUTIONS: None  HAND DOMINANCE: Right  WEIGHT BEARING RESTRICTIONS No  FALLS: Has patient fallen in last 6 months? Yes, Number of falls: 1 - not condition related  LIVING ENVIRONMENT: Lives with: lives with their spouse, 3 boys (kids), 3 dogs Lives in: House/apartment Stairs: Yes; Internal: 3 steps; Rail on N/A going up Has following equipment at home: None  PLOF: Independent  PATIENT GOALS "get back to 100% to get back to working my 3 jobs"   OBJECTIVE:   DIAGNOSTIC FINDINGS: 1. Normal cervical spinal cord, and no significant spinal stenosis despite multilevel disc and endplate degeneration.   2. 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   COGNITION: Overall cognitive status: Within functional limits for tasks assessed   ADLs: Overall ADLs: completes with modified independence but requires increased time and difficulty.  Transfers/ambulation related to ADLs: mod I Eating: mod I  Grooming: Needs assistance styling hair UB Dressing: Increased Time and difficulty but Mod I LB Dressing: Increased Time and difficulty but Mod I Toileting: Increased Time and difficulty but Mod I but no thorough with hygiene Bathing: Increased Time and difficulty but Mod I - has hand  held shower head   IADLs: Shopping: spouse does grocery shopping Light housekeeping: difficulty with folding clothes - spouse assisting right now Meal Prep: cooking - spouse puts pots on stove, etc Community mobility: driving Medication management: independent Financial management: independent   MOBILITY STATUS: Independent  WRITTEN EXPRESSION:  Dominant hand: right Handwriting: 100% legible  VISION: Subjective report: No changes in vision reported Baseline vision: Wears glasses for reading only Visual history: just reading glasses - progressive   ACTIVITY TOLERANCE: Activity tolerance: endurance does not limit participation in activity - gets tired more easily.  FUNCTIONAL OUTCOME MEASURES: UEFS 31/80 - higher number = higher level of function Quick Dash 72.73/100 - lower number = higher level of function  POSTURE COMMENTS: No Significant postural limitations  SENSATION: Light touch: Appears intact Stereognosis: Not tested Hot/Cold: Appears intact Proprioception: Appears intact  COORDINATION:  9 Hole Peg test: Right: 17.78 sec; Left: 23.35 sec Comments: Pt held LUE up with RUE d/t decreased strength for 9 hole peg test.  UE AROM/PROM: WFL for RUE - pt with significant increase in pain with any AROM  in LUE.   A/PROM Right 01/14/2021 Left 01/14/2021  Shoulder flexion WFL 0 AROM  90 PROM  Shoulder abduction  0 AROM     UE MMT: not tested d/t pain.  HAND FUNCTION:  Grip strength: Right: 45.6 lbs; Left: 15.2 lbs    PATIENT EDUCATION: Education details: Education provided on role and purpose of OT Person educated: Patient Education method: Explanation Education comprehension: verbalized understanding     ASSESSMENT:  CLINICAL IMPRESSION: Patient is a 50 y.o. female who was seen today for occupational therapy evaluation and treatment for Left Sided weakness d/t Parsonage Turner Syndrome. Patient has performance deficits in functional skills including  ADLs, IADLs, coordination, ROM, strength, pain, muscle spasms, flexibility, FMC, GMC, decreased knowledge of use of DME and UE functional use, . These impairments are limiting patient from ADLs, IADLs and work. Patient may have co-morbidities  that affects occupational performance. Patient will benefit from skilled OT to address above impairments and improve overall function.  MODIFICATION OR ASSISTANCE TO COMPLETE EVALUATION: No modification of tasks or assist necessary to complete an evaluation.  OT OCCUPATIONAL PROFILE AND HISTORY: Problem focused assessment: Including review of records relating to presenting problem.  CLINICAL DECISION MAKING: LOW - limited treatment options, no task modification necessary  REHAB POTENTIAL: Good  EVALUATION COMPLEXITY: Low     GOALS: Goals reviewed with patient? No  SHORT TERM GOALS: (STG required if POC>30 days)  STG Name Target Date Goal status  1 Pt will be independent with HEP for LUE shoulder ROM  Baseline:  02/11/2021  INITIAL  2 Pt will be independent with any splints and/or braces PRN.  Baseline: ligament injury in RUE 02/11/2021  INITIAL  3 Pt will verbalize increasing independence and ease with ADLs with decrease in pain (no greater than 4/10 with ADLs)  Baseline: 4/10 at eval 02/11/2021  INITIAL  4 Pt will report pain no greater than 8/10 with completing HEP.  Baseline: reported 10/10 pain with AROM at eval  02/11/2021  INITIAL  5 Pt will increase range of motion in LUE shoulder flexion to 25 degrees or greater for active low level reaching  Baseline: 0* active range of motion 02/11/2021  INITIAL   LONG TERM GOALS:   LTG Name Target Date Goal status  1 Pt will be independent with all updated HEPs  Baseline:  03/11/2021  INITIAL  2 Pt will improve grip strength in LUE to 25 lbs or greater for increasing functional use of LUE.   Baseline: R 45.6 lbs, L 15.2 lbs 03/11/2021  INITIAL  3 Pt will complete work simulated tasks (I.e.  lifting pt leg with lymphadema while wrapping, etc) with 90% accuracy  Baseline:  03/11/2021  INITIAL  4 Pt will verbalize understanding of sleep positions for increasing comfort and decreasing pain during sleep.  Baseline:  03/11/2021  INITIAL  5 Pt will demonstrate improved external rotation range of motion to complete bathing and toilet hygiene more thoroughly  Baseline: unable to do 03/11/2021  INITIAL  6 Pt will improve strength and coordination in LUE as evidenced by being able to lift pots and place on stove, etc during cooking tasks.  Baseline: 03/11/2021  INITIAL   PLAN: OT FREQUENCY: 2x/week  OT DURATION: 10 weeks (16 visits over 10 weeks d/t allowing for any missed visits)  PLANNED INTERVENTIONS: self care/ADL training, therapeutic exercise, therapeutic activity, neuromuscular re-education, manual therapy, passive range of motion, aquatic therapy, splinting, ultrasound, traction, moist heat, cryotherapy, patient/family education, energy conservation and DME and/or AE  instructions  PLAN FOR NEXT SESSION: Supine closed chain ROM HEP  RECOMMENDED OTHER SERVICES: N/A right now  CONSULTED AND AGREED WITH PLAN OF CARE: Patient   Perry Mount, OTR/L  01/14/2021, 12:52 PM  [facility] [address] [place], [place], [address] Phone: [phone]   Fax:  [phone]  Patient name: Yesenia Wheeler MRN: [id number] DOB: May 10, 1971

## 2021-01-14 NOTE — Progress Notes (Signed)
Internal Medicine Clinic Attending  I saw and evaluated the patient.  I personally confirmed the key portions of the history and exam documented by Dr. Wynetta Emery and I reviewed pertinent patient test results.  The assessment, diagnosis, and plan were formulated together and I agree with the documentation in the resident's note.   Patient reported a new acute problem of right neck swelling with severe odynophagia initially not able to tolerate solids or liquids. She had some hoarseness of her voice, and on exam had right bulging of the posterior pharynx. Otherwise was well appearing, no signs of stridor. We obtained an urgent CT neck which ruled out paratonsillar abscess or other deep neck space infection. Labs were reassuring, no signs of dehydration. She was able to drink some water in clinic later in the afternoon, so likely this is a viral pharyngitis and will resolve with time and supportive care.

## 2021-01-17 ENCOUNTER — Telehealth: Payer: Self-pay

## 2021-01-17 NOTE — Telephone Encounter (Signed)
Requesting to speak with a nurse, please call back.

## 2021-01-17 NOTE — Telephone Encounter (Signed)
Patient underwent evaluation for acute onset sore throat on 06/02 at [facility] with laboratory workup and CT Soft Tissue Neck unremarkable for significant abnormality. Patient's symptoms attributed to likely be secondary to viral infection with anticipation that she will have improvement with supportive care in the coming days, however provided return precautions if symptoms worsen or fail to improve. As patient presented to clinic at time of closing with report of continued progression of symptoms associated with inability to eat or drink, agree with recommendation for evaluation in the emergency department now.

## 2021-01-17 NOTE — Telephone Encounter (Signed)
Return pt's call - stated she's unable to eat or drink b/c her throat hurts "very badly". She saw Dr Wynetta Emery 6/2. She's starting to feel weak. Stated she has a white coating on her tongue. Stated she tried to gargle with salt/water but she unable d/t the pain. Please advise.

## 2021-01-17 NOTE — Telephone Encounter (Signed)
Pt presented to the office at this time. Stated she was waiting to hear back from [place]. Informed the doctor has been in clinic and has not had time to read my message. Suggested going to the ED as the office will be closing - shaking her no. I talked to Dr Wynetta Emery - who also suggested pt need to go to the ED if her symptoms worsen. Informed pt it's best to go to the ED since she's here in the hospital. She got and left; unsure if she will go to the ED.

## 2021-01-19 ENCOUNTER — Encounter: Payer: Self-pay | Admitting: Internal Medicine

## 2021-01-19 ENCOUNTER — Ambulatory Visit: Payer: Self-pay | Admitting: Occupational Therapy

## 2021-01-19 ENCOUNTER — Other Ambulatory Visit: Payer: Self-pay

## 2021-01-19 ENCOUNTER — Encounter: Payer: Self-pay | Admitting: Occupational Therapy

## 2021-01-19 DIAGNOSIS — M25612 Stiffness of left shoulder, not elsewhere classified: Secondary | ICD-10-CM

## 2021-01-19 DIAGNOSIS — M25511 Pain in right shoulder: Secondary | ICD-10-CM

## 2021-01-19 DIAGNOSIS — G540 Brachial plexus disorders: Secondary | ICD-10-CM

## 2021-01-19 DIAGNOSIS — M6281 Muscle weakness (generalized): Secondary | ICD-10-CM

## 2021-01-19 DIAGNOSIS — R278 Other lack of coordination: Secondary | ICD-10-CM

## 2021-01-19 NOTE — Therapy (Signed)
OUTPATIENT OCCUPATIONAL THERAPY TREATMENT NOTE   Patient Name: Yesenia Wheeler MRN: [id number] DOB:[birth year], 50 y.o., female Today's Date: 01/19/2021  PCP: Merryl Hacker No REFERRING PROVIDER: Armando Reichert, MD     OT End of Session - 01/19/21 1105    Visit Number 2    Number of Visits 17    Date for OT Re-Evaluation 03/11/21    Authorization Type Self Pay - insurance kicks in July 1st, working towards [employer]    OT Start Time 1104    OT Stop Time 1145    OT Time Calculation (min) 41 min    Activity Tolerance Patient tolerated treatment well    Behavior During Therapy WFL for tasks assessed/performed            Past Medical History:  Diagnosis Date  . Acute meniscal tear of left knee   . Anemia   . 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 Migraines   . Mild asthma   . OA (osteoarthritis)    bilateral knees  . SBO (small bowel obstruction) ([place]) 09/2020   Past Surgical History:  Procedure Laterality Date  . BREAST LUMPECTOMY WITH RADIOACTIVE SEED LOCALIZATION Left 10/23/2017   Procedure: BREAST LUMPECTOMY WITH RADIOACTIVE SEED LOCALIZATION;  Surgeon: Erroll Luna, MD;  Location: [facility];  Service: General;  Laterality: Left;  . BREAST REDUCTION SURGERY Bilateral 10/23/2017   Procedure: LEFT BREAST ONCOPLASTIC REDUCTION, RIGHT BREAST REDUCTION;  Surgeon: Irene Limbo, MD;  Location: [facility];  Service: Plastics;  Laterality: Bilateral;  . CESAREAN SECTION  2011  . GASTRIC BYPASS  2001  . KNEE ARTHROSCOPY W/ MENISCECTOMY Right 2007  . KNEE ARTHROSCOPY WITH MEDIAL MENISECTOMY Left 08/30/2017   Procedure: KNEE ARTHROSCOPY WITH PARTIAL MEDIAL MENISECTOMY;  Surgeon: Nicholes Stairs, MD;  Location: [facility];  Service:  Orthopedics;  Laterality: Left;  60 mins  . LAPAROSCOPIC CHOLECYSTECTOMY  2001  . SHOULDER ARTHROSCOPY Left 2008   spur   Patient Active Problem List   Diagnosis Date Noted  . Iron deficiency anemia 01/13/2021  . Thyroid nodule 01/13/2021  . Acute sore throat 01/13/2021  . Depression 01/13/2021  . 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 DVT, lower extremity, recurrent ([place]) 12/30/2020  . SBO (small bowel obstruction) ([place]) 09/28/2020  . Left breast mass 10/23/2017  . Acute medial meniscus tear of left knee 08/30/2017  . BV (bacterial vaginosis) 06/23/2015  . Screening-pulmonary TB 03/15/2015  . Obesity (BMI 30-39.9) 05/26/2014  . Asthma, chronic 05/25/2014  . Migraine 05/25/2014  . 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 Yes VAS scale: 4/10 Pain location: LUE shoulder and distal Pain orientation: Left  PAIN TYPE: aching and spasms Pain description: constant    -------------------------------------------------------------------------------------- OBJECTIVE:    TODAY'S TREATMENT:  Supine - with foam roll with shoulder flexion and chest press x 10 reps - pt req'd min assistance for weakness at elbow.   Seated - physioball x 10  reps forward reaching and horizontal abduction.   TENS (Estim) shoulder level 15 for 15 minutes for pain management. Pt may be interested in purchasing unit.  Wiping Table with towel with increased weight bearing and active movement of LUE shoulder in all planes.  PATIENT EDUCATION: Education details: Education on continuing to move shoulder to prevent frozen shoulder Person educated: Patient Education method: Explanation Education comprehension: verbalized understanding  --------------------------------------------------------------------------------------  ASSESSMENT:  CLINICAL IMPRESSION: Pt progressing towards goals. Pt has agreed to goals set at plan of care. Pt pleased with use of TENS for pain management. May consider purchasing unit.  MODIFICATION OR ASSISTANCE TO COMPLETE EVALUATION: No modification of tasks or assist necessary to complete an evaluation.  OT OCCUPATIONAL PROFILE AND HISTORY: Problem focused assessment: Including review of records relating to presenting problem.  CLINICAL DECISION MAKING: LOW - limited treatment options, no task modification necessary  REHAB POTENTIAL: Good  EVALUATION COMPLEXITY: Low    GOALS: Goals reviewed with patient? Yes  SHORT TERM GOALS: (STG required if POC>30 days)  STG Name Target Date Goal status  1 Pt will be independent with HEP for LUE shoulder ROM  Baseline:  02/11/2021  ONGOING  2 Pt will be independent with any splints and/or braces PRN.  Baseline: ligament injury in RUE 02/11/2021  INITIAL  3 Pt will verbalize increasing independence and ease with ADLs with decrease in pain (no greater than 4/10 with ADLs)  Baseline: 4/10 at eval 02/11/2021  INITIAL  4 Pt will report pain no greater than 8/10 with completing HEP.  Baseline: reported 10/10 pain with AROM at eval  02/11/2021  ONGOING  5 Pt will increase range of motion in LUE shoulder flexion to 25 degrees or greater for active low level  reaching  Baseline: 0* active range of motion 02/11/2021  INITIAL   LONG TERM GOALS:   LTG Name Target Date Goal status  1 Pt will be independent with all updated HEPs  Baseline:  03/11/2021  INITIAL  2 Pt will improve grip strength in LUE to 25 lbs or greater for increasing functional use of LUE.   Baseline: R 45.6 lbs, L 15.2 lbs 03/11/2021  INITIAL  3 Pt will complete work simulated tasks (I.e. lifting pt leg with lymphadema while wrapping, etc) with 90% accuracy  Baseline:  03/11/2021  INITIAL  4 Pt will verbalize understanding of sleep positions for increasing comfort and decreasing pain during sleep.  Baseline:  03/11/2021  INITIAL  5 Pt will demonstrate improved external rotation range of motion to complete bathing and toilet hygiene more thoroughly  Baseline: unable to do 03/11/2021  INITIAL  6 Pt will improve strength and coordination in LUE as evidenced by being able to lift pots and place on stove, etc during cooking tasks.  Baseline: 03/11/2021  INITIAL   PLAN: OT FREQUENCY: 2x/week  OT DURATION: 10 weeks (16 visits over 10 weeks d/t allowing for any missed visits)  PLANNED INTERVENTIONS: self care/ADL training,  therapeutic exercise, therapeutic activity, neuromuscular re-education, manual therapy, passive range of motion, aquatic therapy, splinting, ultrasound, traction, moist heat, cryotherapy, patient/family education, energy conservation and DME and/or AE instructions  PLAN FOR NEXT SESSION: Supine closed chain ROM HEP - did not give exercises d/t pain last session  RECOMMENDED OTHER SERVICES: N/A right now  CONSULTED AND AGREED WITH PLAN OF CARE: Patient      Perry Mount, OTR/L  01/19/2021, 11:45 AM    [facility] [address] [place], [place], [address] Phone: [phone]   Fax:  [phone]  Patient name: Yesenia Wheeler MRN: [id number] DOB:  16-Jul-1971

## 2021-01-21 ENCOUNTER — Other Ambulatory Visit (HOSPITAL_COMMUNITY): Payer: Self-pay

## 2021-01-21 ENCOUNTER — Ambulatory Visit: Payer: Self-pay | Admitting: Occupational Therapy

## 2021-01-24 ENCOUNTER — Ambulatory Visit: Payer: Self-pay | Admitting: Occupational Therapy

## 2021-01-24 ENCOUNTER — Other Ambulatory Visit: Payer: Self-pay

## 2021-01-24 ENCOUNTER — Encounter: Payer: Self-pay | Admitting: Occupational Therapy

## 2021-01-24 DIAGNOSIS — M25612 Stiffness of left shoulder, not elsewhere classified: Secondary | ICD-10-CM

## 2021-01-24 DIAGNOSIS — G540 Brachial plexus disorders: Secondary | ICD-10-CM

## 2021-01-24 DIAGNOSIS — R278 Other lack of coordination: Secondary | ICD-10-CM

## 2021-01-24 DIAGNOSIS — M25511 Pain in right shoulder: Secondary | ICD-10-CM

## 2021-01-24 DIAGNOSIS — M6281 Muscle weakness (generalized): Secondary | ICD-10-CM

## 2021-01-24 NOTE — Therapy (Signed)
OUTPATIENT OCCUPATIONAL THERAPY TREATMENT NOTE   Patient Name: Yesenia Wheeler MRN: [id number] DOB:09-08-1970, 50 y.o., female Today's Date: 01/24/2021  PCP: Mike Craze, DO REFERRING PROVIDER: Myles Rosenthal, MD   OT End of Session - 01/24/21 1024     Visit Number 3    Number of Visits 17    Date for OT Re-Evaluation 03/11/21    Authorization Type Self Pay - insurance kicks in July 1st, working towards [place] Time 1021    OT Stop Time 1100    OT Time Calculation (min) 39 min    Activity Tolerance Patient tolerated treatment well    Behavior During Therapy WFL for tasks assessed/performed             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 , complex sclerosing lesion   Migraines    Mild asthma    OA (osteoarthritis)    bilateral knees   SBO (small bowel obstruction) ([place]) 09/2020   Past Surgical History:  Procedure Laterality Date   BREAST LUMPECTOMY WITH RADIOACTIVE SEED LOCALIZATION Left 10/23/2017   Procedure: BREAST LUMPECTOMY WITH RADIOACTIVE SEED LOCALIZATION;  Surgeon: Erroll Luna, MD;  Location: [facility];  Service: General;  Laterality: Left;   BREAST REDUCTION SURGERY Bilateral 10/23/2017   Procedure: LEFT BREAST ONCOPLASTIC REDUCTION, RIGHT BREAST REDUCTION;  Surgeon: Irene Limbo, MD;  Location: [facility];  Service: Plastics;  Laterality: Bilateral;   CESAREAN SECTION  2011   GASTRIC BYPASS  2001   KNEE ARTHROSCOPY W/ MENISCECTOMY Right 2007   KNEE ARTHROSCOPY WITH MEDIAL MENISECTOMY Left 08/30/2017   Procedure: KNEE ARTHROSCOPY WITH PARTIAL MEDIAL MENISECTOMY;  Surgeon: Nicholes Stairs, MD;  Location: [facility];  Service:  Orthopedics;  Laterality: Left;  60 mins   LAPAROSCOPIC CHOLECYSTECTOMY  2001   SHOULDER ARTHROSCOPY Left 2008   spur   Patient Active Problem List   Diagnosis Date Noted   Iron deficiency anemia 01/13/2021   Thyroid nodule 01/13/2021   Acute sore throat 01/13/2021   Depression 01/13/2021   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   DVT, lower extremity, recurrent ([place]) 12/30/2020   SBO (small bowel obstruction) ([place]) 09/28/2020   Left breast mass 10/23/2017   Acute medial meniscus tear of left knee 08/30/2017   BV (bacterial vaginosis) 06/23/2015   Screening-pulmonary TB 03/15/2015   Obesity (BMI 30-39.9) 05/26/2014   Asthma, chronic 05/25/2014   Migraine 05/25/2014   Chronic back pain 05/25/2014    REFERRING DIAG: R53.1 (ICD-10-CM) - Left-sided weakness   THERAPY DIAG:  Stiffness of left shoulder, not elsewhere classified  Other lack of coordination  Acute pain of right shoulder  Muscle weakness (generalized)  Brachial plexus disorders    SUBJECTIVE: I'm doing ok I was in a lot of pain this weekend.  PAIN:  Are you having pain? 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  Supine Closed Chain with unweighted  dowel - 10x flexion, chest press, horizontal abduction, external rotation, abduction with some increase in pain but better than previous session.  LUE functional use/reach to midline with LUE and placing connect four chips into board/frame with freq rest breaks and reports of fatigue. Pt was able to do repetitive reaching x approximately 40 chips.    PATIENT EDUCATION: Education details: Education on continuing to move shoulder to prevent frozen shoulder. Added supine closed chain shoulder exercises (see pt instructions from 01/24/21) and pendulum swings. Person educated: Patient Education method: Customer service manager Education comprehension: verbalized and demonstrated understanding    --------------------------------------------------------------------------------------   ASSESSMENT:   CLINICAL IMPRESSION: Pt with overall decrease in pain in LUE and reported increased active abduction with LUE.   MODIFICATION OR ASSISTANCE TO COMPLETE EVALUATION: No modification of tasks or assist necessary to complete an evaluation.   OT OCCUPATIONAL PROFILE AND HISTORY: Problem focused assessment: Including review of records relating to presenting problem.   CLINICAL DECISION MAKING: LOW - limited treatment options, no task modification necessary   REHAB POTENTIAL: Good   EVALUATION COMPLEXITY: Low       GOALS: Goals reviewed with patient? Yes   SHORT TERM GOALS: (STG required if POC>30 days)   STG Name Target Date Goal status  1 Pt will be independent with HEP for LUE shoulder ROM   Baseline:  02/11/2021   ONGOING  2 Pt will be independent with any splints and/or braces PRN.   Baseline: ligament injury in RUE.  Issued [place] splint for RUE (01/24/21) 02/11/2021   ONGOING  3 Pt will verbalize increasing independence and ease with ADLs with decrease in pain (no greater than 4/10 with ADLs)   Baseline: 4/10 at eval 02/11/2021   INITIAL  4 Pt will report pain no greater than 8/10  with completing HEP.   Baseline: reported 10/10 pain with AROM at eval.  Reported 7/10 during exercises (supine) on 01/24/21 02/11/2021   ONGOING  5 Pt will increase range of motion in LUE shoulder flexion to 25 degrees or greater for active low level reaching   Baseline: 0* active range of motion 02/11/2021   INITIAL    LONG TERM GOALS:   LTG Name Target Date Goal status  1 Pt will be independent with all updated HEPs   Baseline:  03/11/2021   INITIAL  2 Pt will improve grip strength in LUE to 25 lbs or greater for increasing functional use of LUE.    Baseline: R 45.6 lbs, L 15.2 lbs 03/11/2021   INITIAL  3 Pt will complete work simulated tasks (I.e. lifting pt leg with lymphadema while wrapping, etc) with 90% accuracy   Baseline:  03/11/2021   INITIAL  4 Pt will verbalize understanding of sleep positions for increasing comfort and decreasing pain during sleep.   Baseline:  03/11/2021   INITIAL  5 Pt will demonstrate improved external rotation range of motion to complete bathing and toilet hygiene more thoroughly   Baseline: unable to do 03/11/2021   INITIAL  6 Pt will improve strength and coordination in LUE as evidenced by being able to lift  pots and place on stove, etc during cooking tasks.   Baseline: 03/11/2021   INITIAL    PLAN: OT FREQUENCY: 2x/week   OT DURATION: 10 weeks (16 visits over 10 weeks d/t allowing for any missed visits)   PLANNED INTERVENTIONS: self care/ADL training, therapeutic exercise, therapeutic activity, neuromuscular re-education, manual therapy, passive range of motion, aquatic therapy, splinting, ultrasound, traction, moist heat, cryotherapy, patient/family education, energy conservation and DME and/or AE instructions   PLAN FOR NEXT SESSION: review supine closed chain exercises for HEP, see how they are going, see how compression sleeve and splint are working for patient.    RECOMMENDED OTHER SERVICES: N/A right now   CONSULTED AND AGREED WITH  PLAN OF CARE: Patient    Perry Mount, OTR/L  01/24/2021, 10:25 AM    [facility] [address] [place], [place], [address] Phone: [phone]   Fax:  [phone]  Patient name: Yesenia Wheeler MRN: [id number] DOB: Jan 02, 1971

## 2021-01-24 NOTE — Patient Instructions (Signed)
   Cane Overhead - Supine  Hold cane at thighs with both hands, extend arms straight over head. Hold 5 seconds. Repeat 10 times. Do 2-3 times per day.  External Rotation (Eccentric), Active-Assist - Supine (Cane)  Lie on back, affected arm out from side, elbow at 90, forearm forward. Use cane to assist in lifting forearm of affected arm to neutral. Slowly lower for 3-5 seconds. 10 reps per set,3 sets per day, 5-7 days per week.   Copyright  VHI. All rights reserved.      Cane Horizontal - Supine   With straight arms holding cane above shoulders, bring cane out to right, center, out to left, and back to above head. Repeat 10 times. Do 2-3 times per day.   Supine: Chest Press (Active)    Lie on back with arms fully extended. Lower bar or dowel slowly to chest and press to arm's length. Use ___ lbs. Complete ___ sets of ___ repetitions. Perform ___ sessions per day.  Copyright  VHI. All rights reserved.    http://[email or web address].exer.us/87     Copyright  VHI. All rights reserved.

## 2021-01-28 ENCOUNTER — Ambulatory Visit: Payer: Self-pay | Admitting: Occupational Therapy

## 2021-01-28 ENCOUNTER — Other Ambulatory Visit: Payer: Self-pay

## 2021-02-02 ENCOUNTER — Ambulatory Visit: Payer: Self-pay | Admitting: Neurology

## 2021-02-02 ENCOUNTER — Other Ambulatory Visit: Payer: Self-pay

## 2021-02-02 ENCOUNTER — Encounter: Payer: Self-pay | Admitting: Neurology

## 2021-02-02 ENCOUNTER — Ambulatory Visit: Payer: Self-pay | Admitting: Occupational Therapy

## 2021-02-02 VITALS — BP 129/88 | HR 74 | Ht 64.0 in | Wt 212.6 lb

## 2021-02-02 DIAGNOSIS — M5410 Radiculopathy, site unspecified: Secondary | ICD-10-CM

## 2021-02-02 DIAGNOSIS — M6281 Muscle weakness (generalized): Secondary | ICD-10-CM

## 2021-02-02 DIAGNOSIS — M25511 Pain in right shoulder: Secondary | ICD-10-CM

## 2021-02-02 DIAGNOSIS — R278 Other lack of coordination: Secondary | ICD-10-CM

## 2021-02-02 DIAGNOSIS — G540 Brachial plexus disorders: Secondary | ICD-10-CM

## 2021-02-02 DIAGNOSIS — M25612 Stiffness of left shoulder, not elsewhere classified: Secondary | ICD-10-CM

## 2021-02-02 MED ORDER — GABAPENTIN 300 MG PO CAPS: 300.0000 mg | ORAL_CAPSULE | Freq: Two times a day (BID) | ORAL | 3 refills | Status: DC

## 2021-02-02 NOTE — Progress Notes (Signed)
Reason for visit: Left arm weakness  Referring physician: [facility]  Yesenia Wheeler is a 50 y.o. female  History of present illness:  Yesenia Wheeler is a 50 year old right-handed black female with a history of onset of discomfort and weakness in the left arm that occurred 1 to 2 weeks prior to a hospital admission that occurred on 29 Dec 2020.  The patient first started noting a cramping sensation in the upper arm and shoulder on the left, and then within the next week or so weakness ensued in the arm to the point where she was unable to lift the arm up over her head.  The patient did sense some numbness or tingling in the upper arm on the deltoid area.  The patient has felt that the grip strength has also been affected.  She denies any problems with the right arm or with the lower extremities.  She did have some slight balance issues initially but this has cleared up.  The patient reports that if she turns her head to the left she can induce pain into the shoulder on the left side.  She was admitted to the hospital, MRI of the brain was done and was unremarkable.  MRI of the cervical spine did show multilevel neuroforaminal stenosis, with possible impingement of the left C6 nerve root and right C5 nerve root and right C7 nerve root.  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  She now is in some occupational therapy but she is still having some achy sensations in the left shoulder that keep her awake at night.  She is still unable to lift her arm above her head but she does believe that she has regained some strength.  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 , complex sclerosing  lesion   Migraines    Mild asthma    OA (osteoarthritis)    bilateral knees   Parsonage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    SBO (small bowel obstruction) ([place]) 09/2020    Past Surgical History:  Procedure Laterality Date   BREAST LUMPECTOMY WITH RADIOACTIVE SEED LOCALIZATION Left 10/23/2017   Procedure: BREAST LUMPECTOMY WITH RADIOACTIVE SEED LOCALIZATION;  Surgeon: Erroll Luna, MD;  Location: [place];  Service: General;  Laterality: Left;   BREAST REDUCTION SURGERY Bilateral 10/23/2017   Procedure: LEFT BREAST ONCOPLASTIC REDUCTION, RIGHT BREAST REDUCTION;  Surgeon: Irene Limbo, MD;  Location: [place];  Service: Plastics;  Laterality: Bilateral;   CESAREAN SECTION  2011   GASTRIC BYPASS  2001   KNEE ARTHROSCOPY W/ MENISCECTOMY Right 2007   KNEE ARTHROSCOPY WITH MEDIAL MENISECTOMY Left 08/30/2017   Procedure: KNEE ARTHROSCOPY WITH PARTIAL MEDIAL MENISECTOMY;  Surgeon: Nicholes Stairs, MD;  Location: [facility];  Service: Orthopedics;  Laterality: Left;  60 mins   LAPAROSCOPIC CHOLECYSTECTOMY  2001   SHOULDER ARTHROSCOPY Left 2008   spur    Family History  Problem Relation Age of Onset   Cancer Father        oral    Social history:  reports that she quit smoking about 3 years ago. Her smoking  use included cigarettes. She has never used smokeless tobacco. She reports current alcohol use. She reports that she does not use drugs.  Medications:  Prior to Admission medications   Medication Sig Start Date End Date Taking? Authorizing Provider  albuterol (VENTOLIN HFA) 108 (90 Base) MCG/ACT inhaler Inhale into the lungs every 6 (six) hours as needed for wheezing or shortness of breath.   Yes [provider]  apixaban (ELIQUIS) 5 MG TABS tablet Take 1 tablet (5 mg total) by mouth 2 (two) times daily. 01/13/21  Yes Cato Mulligan, MD  Iron Polysacch Cmplx-B12-FA (POLY-IRON 150 FORTE) 150-0.025-1 MG CAPS Take 150 mg by mouth daily.  01/13/21  Yes Cato Mulligan, MD  sertraline (ZOLOFT) 25 MG tablet Take 1 tablet (25 mg total) by mouth daily. 01/13/21 02/12/21 Yes Cato Mulligan, MD  benzocaine-menthol (CHLORASEPTIC SORE THROAT) 6-10 MG lozenge Take 1 lozenge by mouth as needed for sore throat. 01/13/21   Cato Mulligan, MD  enoxaparin (LOVENOX) 40 MG/0.4ML injection Inject 0.4 mLs (40 mg total) into the skin daily. Start 3.14.19 Patient not taking: Reported on 04/30/2018 10/24/17 03/16/20  Irene Limbo, MD      Allergies  Allergen Reactions   Hydrocodone     ROS:  Out of a complete 14 system review of symptoms, the patient complains only of the following symptoms, and all other reviewed systems are negative.  Left arm weakness Neck pain, left shoulder pain  Blood pressure 129/88, pulse 74, height 5\' 4"  (1.626 m), weight 212 lb 9.6 oz (96.4 kg).  Physical Exam  General: The patient is alert and cooperative at the time of the examination.  Eyes: Pupils are equal, round, and reactive to light. Discs are flat bilaterally.  Neck: The neck is supple, no carotid bruits are noted.  Respiratory: The respiratory examination is clear.  Cardiovascular: The cardiovascular examination reveals a regular rate and rhythm, no obvious murmurs or rubs are noted.  Skin: Extremities are without significant edema.  Neurologic Exam  Mental status: The patient is alert and oriented x 3 at the time of the examination. The patient has apparent normal recent and remote memory, with an apparently normal attention span and concentration ability.  Cranial nerves: Facial symmetry is present. There is good sensation of the face to pinprick and soft touch bilaterally. The strength of the facial muscles and the muscles to head turning and shoulder shrug are normal bilaterally. Speech is well enunciated, no aphasia or dysarthria is noted. Extraocular movements are full. Visual fields are full. The tongue is midline, and the patient has  symmetric elevation of the soft palate. No obvious hearing deficits are noted.  Motor: The motor testing reveals 5 over 5 strength of all 4 extremities, with exception of some weakness with the left deltoid, external rotation of the left arm, and supination of the left arm, otherwise the muscles of the left arm appear to be of normal strength. Good symmetric motor tone is noted throughout.  Sensory: Sensory testing is intact to pinprick, soft touch, vibration sensation, and position sense on all 4 extremities. No evidence of extinction is noted.  Coordination: Cerebellar testing reveals good finger-nose-finger and heel-to-shin bilaterally.  Gait and station: Gait is normal. Tandem gait is normal. Romberg is negative. No drift is seen.  Reflexes: Deep tendon reflexes are symmetric and normal bilaterally. Toes are downgoing bilaterally.   MRI cervical 12/30/20:  IMPRESSION: 1. Normal cervical spinal cord, and no significant spinal stenosis despite multilevel disc and endplate degeneration.   2.  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  * MRI scan images were reviewed online. I agree with the written report.   MRI brain 12/30/20:  IMPRESSION: Normal MRI appearance of the brain.  No explanation for weakness.      Assessment/Plan:  1.  Left C5 radiculopathy by clinical examination  The examination reveals that primarily the C5 innervated muscles of the left arm are affected.  This does not correlate with any potential impingement noted by MRI of the cervical spine.  The patient will be sent for blood work today.  She will have nerve conduction studies on both arms and EMG on the left arm.  If the EMG appears to be most consistent with a C5 radiculopathy, the patient will be sent for cervical myelogram with CT to follow to exclude impingement of the C5 nerve root.  Otherwise, if no  impingement is noted, conservative management is indicated.  Jill Alexanders MD 02/02/2021 9:43 AM  [facility] [address] [place], [place] [address]  Phone [phone] Fax [phone]

## 2021-02-02 NOTE — Therapy (Signed)
OUTPATIENT OCCUPATIONAL THERAPY TREATMENT NOTE ARRIVE NO CHARGE - pt going on hold.   Patient Name: Yesenia Wheeler MRN: [id number] DOB:[birth year], 50 y.o., female Today's Date: 02/02/2021  PCP: Mike Craze, DO REFERRING PROVIDER: Myles Rosenthal, MD   OT End of Session - 02/02/21 1244     Visit Number 0   arrive no charge   Number of Visits 17    Date for OT Re-Evaluation 03/11/21    Authorization Type Self Pay - insurance kicks in July 1st, working towards [employer]    OT Start Time 1235    OT Stop Time 1245   arrive - no charge - pt going on hold   OT Time Calculation (min) 10 min    Activity Tolerance Patient tolerated treatment well    Behavior During Therapy WFL for tasks assessed/performed              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 , complex sclerosing lesion   Migraines    Mild asthma    OA (osteoarthritis)    bilateral knees   Parsonage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    SBO (small bowel obstruction) ([place]) 09/2020   Past Surgical History:  Procedure Laterality Date   BREAST LUMPECTOMY WITH RADIOACTIVE SEED LOCALIZATION Left 10/23/2017   Procedure: BREAST LUMPECTOMY WITH RADIOACTIVE SEED LOCALIZATION;  Surgeon: Erroll Luna, MD;  Location: [place];  Service: General;  Laterality: Left;   BREAST REDUCTION SURGERY Bilateral 10/23/2017   Procedure: LEFT BREAST ONCOPLASTIC REDUCTION, RIGHT BREAST REDUCTION;  Surgeon: Irene Limbo, MD;  Location: [place];  Service: Plastics;  Laterality: Bilateral;   CESAREAN SECTION  2011   GASTRIC BYPASS  2001   KNEE ARTHROSCOPY W/ MENISCECTOMY Right 2007   KNEE ARTHROSCOPY WITH MEDIAL MENISECTOMY Left 08/30/2017   Procedure: KNEE ARTHROSCOPY  WITH PARTIAL MEDIAL MENISECTOMY;  Surgeon: Nicholes Stairs, MD;  Location: [facility];  Service: Orthopedics;  Laterality: Left;  60 mins   LAPAROSCOPIC CHOLECYSTECTOMY  2001   SHOULDER ARTHROSCOPY Left 2008   spur   Patient Active Problem List   Diagnosis Date Noted   Iron deficiency anemia 01/13/2021   Thyroid nodule 01/13/2021   Acute sore throat 01/13/2021   Depression 01/13/2021   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   DVT, lower extremity, recurrent ([place]) 12/30/2020   SBO (small bowel obstruction) ([place]) 09/28/2020   Left breast mass 10/23/2017   Acute medial meniscus tear of left knee 08/30/2017   BV (bacterial vaginosis) 06/23/2015   Screening-pulmonary TB 03/15/2015   Obesity (BMI 30-39.9) 05/26/2014   Asthma, chronic 05/25/2014   Migraine 05/25/2014   Chronic back pain 05/25/2014    REFERRING DIAG: R53.1 (ICD-10-CM) - Left-sided weakness   THERAPY DIAG:  Stiffness of left shoulder, not elsewhere classified  Acute pain of right shoulder  Other lack of coordination  Muscle weakness (generalized)  Brachial plexus disorders    SUBJECTIVE: Pt reports going to the neurologist today and looking at more C5-C6 radiculopathy diagnosis vs. Parsonage Turner Syndrome.  PAIN:  Are you having pain? Yes VAS scale: 3/10 Pain location: left suprascap region Pain orientation: Left  PAIN TYPE: aching and throbbing Pain description: constant  Aggravating factors: none - MD had turn neck to left which increased pain.  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 Pt received new diagnosis from neurologist and pending testing for results and possibly surgery.   PATIENT EDUCATION: Education details: Education on continuing to move shoulder to prevent frozen shoulder. Added supine closed chain shoulder exercises (see pt instructions from 01/24/21) and pendulum swings. Educated on continuing movement but will cancel remaining visits and place on hold pending possibility of surgery, etc. (02/02/21) Person educated: Patient Education method: Explanation Education comprehension: verbalized    --------------------------------------------------------------------------------------   ASSESSMENT:   CLINICAL IMPRESSION: Pt going on hold pending nerve conduction study and possibility of surgery for C5/C6 radiculopathy. Will d/c after 30 days if patient has not returned.    MODIFICATION OR ASSISTANCE TO COMPLETE EVALUATION: No modification of tasks or assist necessary to complete an evaluation.   OT OCCUPATIONAL PROFILE AND HISTORY: Problem focused assessment: Including review of records relating to presenting problem.   CLINICAL DECISION MAKING: LOW - limited treatment options, no task modification necessary   REHAB POTENTIAL: Good   EVALUATION COMPLEXITY: Low       GOALS: Goals reviewed with patient? Yes   SHORT TERM GOALS: (STG required if POC>30 days)   STG Name Target Date Goal status  1 Pt will be independent with HEP for LUE shoulder ROM   Baseline:  02/11/2021   ONGOING  2 Pt will be independent with any splints and/or braces PRN.   Baseline: ligament injury in RUE.  Issued [place] splint for RUE (01/24/21) 02/11/2021   ONGOING  3 Pt will verbalize increasing independence and ease with ADLs with decrease in pain (no greater than 4/10 with ADLs)   Baseline: 4/10 at eval 02/11/2021   INITIAL   4 Pt will report pain no greater than 8/10 with completing HEP.   Baseline: reported 10/10 pain with AROM at eval.  Reported 7/10 during exercises (supine) on 01/24/21 02/11/2021   ONGOING  5 Pt will increase range of motion in LUE shoulder flexion to 25 degrees or greater for active low level reaching   Baseline: 0* active range of motion 02/11/2021   INITIAL    LONG TERM GOALS:   LTG Name Target Date Goal status  1 Pt will be independent with all updated HEPs   Baseline:  03/11/2021   INITIAL  2 Pt will improve grip strength in LUE to 25 lbs or greater for increasing functional use of LUE.    Baseline: R 45.6 lbs, L 15.2 lbs 03/11/2021   INITIAL  3 Pt will complete work simulated tasks (I.e. lifting pt leg with lymphadema while wrapping, etc) with 90% accuracy   Baseline:  03/11/2021   INITIAL  4 Pt will verbalize understanding of sleep positions for increasing comfort and decreasing pain during sleep.   Baseline:  03/11/2021   INITIAL  5 Pt will demonstrate improved external rotation range of motion to complete bathing and toilet hygiene more thoroughly   Baseline: unable to do 03/11/2021   INITIAL  6 Pt will improve strength and coordination in LUE as evidenced  by being able to lift pots and place on stove, etc during cooking tasks.   Baseline: 03/11/2021   INITIAL    PLAN: OT FREQUENCY: 2x/week   OT DURATION: 10 weeks (16 visits over 10 weeks d/t allowing for any missed visits)   PLANNED INTERVENTIONS: self care/ADL training, therapeutic exercise, therapeutic activity, neuromuscular re-education, manual therapy, passive range of motion, aquatic therapy, splinting, ultrasound, traction, moist heat, cryotherapy, patient/family education, energy conservation and DME and/or AE instructions   PLAN FOR NEXT SESSION: going on hold for 30 days pending new diagnosis and potential surgery. Will d/c after 30 days if pt has not returned.  RECOMMENDED OTHER SERVICES: N/A right now    CONSULTED AND AGREED WITH PLAN OF CARE: Patient    Perry Mount, OTR/L  02/02/2021, 12:49 PM    [facility] [address] [place], [place], [address] Phone: [phone]   Fax:  [phone]  Patient name: Yesenia Wheeler MRN: [id number] DOB: [birth year]

## 2021-02-03 ENCOUNTER — Ambulatory Visit: Payer: Self-pay | Admitting: Behavioral Health

## 2021-02-03 DIAGNOSIS — F331 Major depressive disorder, recurrent, moderate: Secondary | ICD-10-CM

## 2021-02-03 DIAGNOSIS — Z638 Other specified problems related to primary support group: Secondary | ICD-10-CM

## 2021-02-04 ENCOUNTER — Ambulatory Visit: Payer: Self-pay | Admitting: Occupational Therapy

## 2021-02-08 LAB — ANCA PROFILE
Anti-MPO Antibodies: <0.2 units (ref 0.0–0.9)
Anti-PR3 Antibodies: <0.2 units (ref 0.0–0.9)
Atypical pANCA: <1:20 {titer}
C-ANCA: <1:20 {titer}
P-ANCA: <1:20 {titer}

## 2021-02-08 LAB — SEDIMENTATION RATE: Sed Rate: 39 mm/hr — ABNORMAL HIGH (ref 0–32)

## 2021-02-08 LAB — ANA W/REFLEX: Anti Nuclear Antibody (ANA): NEGATIVE

## 2021-02-08 LAB — ANGIOTENSIN CONVERTING ENZYME: Angio Convert Enzyme: 60 U/L (ref 14–82)
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
## 2021-02-09 ENCOUNTER — Encounter: Payer: Self-pay | Admitting: Occupational Therapy

## 2021-02-11 ENCOUNTER — Encounter: Payer: Self-pay | Admitting: Occupational Therapy

## 2021-02-15 ENCOUNTER — Encounter: Payer: Self-pay | Admitting: *Deleted

## 2021-02-16 ENCOUNTER — Encounter: Payer: Self-pay | Admitting: Occupational Therapy

## 2021-02-18 ENCOUNTER — Encounter: Payer: Self-pay | Admitting: Occupational Therapy

## 2021-02-21 ENCOUNTER — Encounter: Payer: Self-pay | Admitting: Occupational Therapy

## 2021-02-23 ENCOUNTER — Encounter: Payer: Self-pay | Admitting: Occupational Therapy

## 2021-03-02 ENCOUNTER — Ambulatory Visit: Payer: Self-pay | Admitting: Behavioral Health

## 2021-03-02 ENCOUNTER — Encounter: Payer: Self-pay | Admitting: Occupational Therapy

## 2021-03-02 DIAGNOSIS — Z630 Problems in relationship with spouse or partner: Secondary | ICD-10-CM

## 2021-03-02 DIAGNOSIS — F331 Major depressive disorder, recurrent, moderate: Secondary | ICD-10-CM

## 2021-03-02 DIAGNOSIS — F419 Anxiety disorder, unspecified: Secondary | ICD-10-CM

## 2021-03-02 NOTE — BH Specialist Note (Signed)
Integrated Behavioral Health via Telemedicine Visit  03/02/2021 Ellamae Lybeck [id number]  Number of [place] visits: 2/6 Session Start time: 11:00am  Session End time: 12:00pm Total time: 17  Referring Provider: Dr. Cato Mulligan, MD Patient/Family location: Pt is home in private [facility] Provider location: [facility] OFFICE All persons participating in visit: Pt & Clinician Types of Service: Individual psychotherapy  I connected with [name] and/or [name]  self  via  Telephone or Geologist, engineering  (Video is Caregility application) and verified that I am speaking with the correct person using two identifiers. Discussed confidentiality:  2nd visit  I discussed the limitations of telemedicine and the availability of in person appointments.  Discussed there is a possibility of technology failure and discussed alternative modes of communication if that failure occurs.  I discussed that engaging in this telemedicine visit, they consent to the provision of behavioral healthcare and the services will be billed under their insurance.  Patient and/or legal guardian expressed understanding and consented to Telemedicine visit:  2nd visit  Presenting Concerns: Patient and/or family reports the following symptoms/concerns: elevated sense of negative well-being w/I the marital system Duration of problem: 10 yrs; Severity of problem: moderate to severe  Patient and/or Family's Strengths/Protective Factors: Social connections, Social and Emotional competence, Concrete supports in place (healthy food, safe environments, etc.), Sense of purpose, and Pt resiliency factors present  Goals Addressed: Patient will:  Reduce symptoms of: anxiety, depression, stress, and financial stressors    Increase knowledge and/or ability of:  relational health & ability to determine how to improve quality, & also care for self    Demonstrate ability to:  Increase healthy adjustment to current life circumstances, Increase adequate support systems for patient/family, and handle relational issues w/confidence & loving spirit  Progress towards Goals: Ongoing  Interventions: Interventions utilized:  Behavioral Activation, Supportive Counseling, and tools for relational health possibly pre-separation Standardized Assessments completed:  screeners prn  Patient and/or Family Response: Pt receptive & aware of call today. Pt requests future sessions during this stressful time.  Assessment: Patient currently experiencing elevated anx/dep & aggrivation due to argument w/Husb last evening over his beh choices w/I the marital relationship. Husb sts he is getting affection from another woman w/Hx of this throughout the 10 yr marital union. Pt has let Husb know she will not be intimate w/him while he is w/other women.This has created a vicious cycle btwn the Cpl.  Patient may benefit from Cpl Cslg session in the near future. Mailed Pt an [place] in session to evaluate relational quality/temperament.  Plan: Follow up with behavioral health clinician on : 2-3 wks for 60 min f:f visit to include Husb if he agrees. Behavioral recommendations: Contact the CDFA source Clinician supplied from [place]. Read the materials safely on your phone & see if the info prepares you to make decisions in a healthy way. Review the [employer] I mailed you to promote positive conversations btwn yourself & Husb. Referral(s): [place] (In Clinic) and [place], [place]; The [employer] on the web.  I discussed the assessment and treatment plan with the patient and/or parent/guardian. They were provided an opportunity to ask questions and all were answered. They agreed with the plan and demonstrated an understanding of the instructions.   They were advised to call back or seek an in-person  evaluation if the symptoms worsen or if the condition fails to improve as anticipated.  Ryder Man L Tyrihanna Wingert, LMFT 

## 2021-03-04 ENCOUNTER — Encounter: Payer: Self-pay | Admitting: Occupational Therapy

## 2021-03-09 ENCOUNTER — Ambulatory Visit: Payer: Self-pay | Admitting: Occupational Therapy

## 2021-03-11 ENCOUNTER — Encounter: Payer: Self-pay | Admitting: Occupational Therapy

## 2021-03-15 ENCOUNTER — Ambulatory Visit: Payer: No Typology Code available for payment source | Admitting: Neurology

## 2021-03-15 ENCOUNTER — Encounter: Payer: Self-pay | Admitting: Neurology

## 2021-03-15 DIAGNOSIS — G545 Neuralgic amyotrophy: Secondary | ICD-10-CM

## 2021-03-15 DIAGNOSIS — M5410 Radiculopathy, site unspecified: Secondary | ICD-10-CM

## 2021-03-15 DIAGNOSIS — M5412 Radiculopathy, cervical region: Secondary | ICD-10-CM

## 2021-03-15 MED ORDER — GABAPENTIN 300 MG PO CAPS: ORAL_CAPSULE | ORAL | 3 refills | Status: DC

## 2021-03-15 NOTE — Procedures (Signed)
     HISTORY:  Yesenia Wheeler is a 50 year old patient with onset of pain and weakness involving the left upper extremity.  This has gradually improved over time, but she is still having some discomfort in the upper part of the arm and muscle twitches in this area.  She is being evaluated for possible brachial plexopathy or cervical radiculopathy.  NERVE CONDUCTION STUDIES:  Nerve conduction studies were performed on both upper extremities. The distal motor latencies and motor amplitudes for the median and ulnar nerves were within normal limits. The nerve conduction velocities for these nerves were also normal. The sensory latencies for the median and ulnar nerves were normal. The F wave latencies for the ulnar nerves were within normal limits.  EMG STUDIES:  EMG study was performed on the left upper extremity:  The first dorsal interosseous muscle reveals 2 to 4 K units with full recruitment. No fibrillations or positive waves were noted. The abductor pollicis brevis muscle reveals 2 to 4 K units with full recruitment. No fibrillations or positive waves were noted. The extensor indicis proprius muscle reveals 1 to 3 K units with full recruitment. No fibrillations or positive waves were noted. The pronator teres muscle reveals 2 to 3 K units with full recruitment. No fibrillations or positive waves were noted. The biceps muscle reveals 1 to 2 K units with full recruitment. No fibrillations or positive waves were noted. The triceps muscle reveals 2 to 4 K units with full recruitment. No fibrillations or positive waves were noted. The anterior deltoid muscle reveals 2 to 3 K units with full recruitment. No fibrillations or positive waves were noted. The cervical paraspinal muscles were tested at 2 levels. No abnormalities of insertional activity were seen at either level tested. There was good relaxation.  IMPRESSION:  Nerve conduction studies done on both upper extremities were within  normal limits.  No evidence of neuropathy was seen.  EMG of the left upper extremity was unremarkable, no evidence of an overlying cervical radiculopathy or brachial plexopathy was seen.  Jill Alexanders MD 03/15/2021 2:50 PM  [facility] [address] [place], [place] [address]  Phone [phone] Fax [phone]

## 2021-03-15 NOTE — Progress Notes (Signed)
Please refer to EMG and nerve conduction procedure note.  

## 2021-03-15 NOTE — Progress Notes (Addendum)
The patient comes in the office today for EMG and nerve conduction study.  She has regained strength in the left arm, but she is still having muscle twitches in the arm and some discomfort.  She is on gabapentin taking 300 mg twice daily, we will go up on the dose of the medication.  We will follow patient conservatively.     [place]    Nerve / Sites Muscle Latency Ref. Amplitude Ref. Rel Amp Segments Distance Velocity Ref. Area    ms ms mV mV %  cm m/s m/s mVms  R Median - APB     Wrist APB 3.7 ?4.4 11.3 ?4.0 100 Wrist - APB 7   41.9     Upper arm APB 7.5  10.9  96.5 Upper arm - Wrist 21 55 ?49 39.6  L Median - APB     Wrist APB 4.0 ?4.4 9.3 ?4.0 100 Wrist - APB 7   37.7     Upper arm APB 7.9  9.3  100 Upper arm - Wrist 23 59 ?49 37.2  R Ulnar - ADM     Wrist ADM 2.9 ?3.3 7.8 ?6.0 100 Wrist - ADM 7   21.9     B.Elbow ADM 6.1  6.5  82.9 B.Elbow - Wrist 19 59 ?49 20.2     A.Elbow ADM 7.8  6.1  94.9 A.Elbow - B.Elbow 10 59 ?49 19.3  L Ulnar - ADM     Wrist ADM 2.9 ?3.3 6.8 ?6.0 100 Wrist - ADM 7   24.7     B.Elbow ADM 6.3  6.7  97.5 B.Elbow - Wrist 19 56 ?49 24.1     A.Elbow ADM 8.3  6.3  94.3 A.Elbow - B.Elbow 10 52 ?49 22.9             SNC    Nerve / Sites Rec. Site Peak Lat Ref.  Amp Ref. Segments Distance Peak Diff Ref.    ms ms V V  cm ms ms  R Median, Ulnar - Transcarpal comparison     Median Palm Wrist 2.1 ?2.2 47 ?35 Median Palm - Wrist 8       Ulnar Palm Wrist 2.0 ?2.2 20 ?12 Ulnar Palm - Wrist 8          Median Palm - Ulnar Palm  0.1 ?0.4  L Median, Ulnar - Transcarpal comparison     Median Palm Wrist 2.2 ?2.2 77 ?35 Median Palm - Wrist 8       Ulnar Palm Wrist 2.0 ?2.2 17 ?12 Ulnar Palm - Wrist 8          Median Palm - Ulnar Palm  0.2 ?0.4  R Median - Orthodromic (Dig II, Mid palm)     Dig II Wrist 3.1 ?3.4 11 ?10 Dig II - Wrist 13    L Median - Orthodromic (Dig II, Mid palm)     Dig II Wrist 3.1 ?3.4 12 ?10 Dig II - Wrist 13    R Ulnar - Orthodromic, (Dig V, Mid palm)      Dig V Wrist 2.5 ?3.1 6 ?5 Dig V - Wrist 11    L Ulnar - Orthodromic, (Dig V, Mid palm)     Dig V Wrist 2.8 ?3.1 7 ?5 Dig V - Wrist 74                   F  Wave    Nerve F Lat Ref.   ms ms  R Ulnar - ADM 27.6 ?32.0  L Ulnar - ADM 27.7 ?32.0

## 2021-03-16 ENCOUNTER — Encounter: Payer: Self-pay | Admitting: Occupational Therapy

## 2021-03-16 NOTE — Therapy (Signed)
OUTPATIENT OCCUPATIONAL THERAPY DISCHARGE    Patient Name: Yesenia Wheeler MRN: [id number] DOB:Oct 19, 1970, 49 y.o., female Today's Date: 03/16/2021  PCP: Mike Craze, DO REFERRING PROVIDER: No ref. provider found    Unable to assess goals. No visit discharge.  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 , complex sclerosing lesion   Migraines    Mild asthma    OA (osteoarthritis)    bilateral knees   Parsonage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    SBO (small bowel obstruction) ([place]) 09/2020   Past Surgical History:  Procedure Laterality Date   BREAST LUMPECTOMY WITH RADIOACTIVE SEED LOCALIZATION Left 10/23/2017   Procedure: BREAST LUMPECTOMY WITH RADIOACTIVE SEED LOCALIZATION;  Surgeon: Erroll Luna, MD;  Location: [facility];  Service: General;  Laterality: Left;   BREAST REDUCTION SURGERY Bilateral 10/23/2017   Procedure: LEFT BREAST ONCOPLASTIC REDUCTION, RIGHT BREAST REDUCTION;  Surgeon: Irene Limbo, MD;  Location: [facility];  Service: Plastics;  Laterality: Bilateral;   CESAREAN SECTION  2011   GASTRIC BYPASS  2001   KNEE ARTHROSCOPY W/ MENISCECTOMY Right 2007   KNEE ARTHROSCOPY WITH MEDIAL MENISECTOMY Left 08/30/2017   Procedure: KNEE ARTHROSCOPY WITH PARTIAL MEDIAL MENISECTOMY;  Surgeon: Nicholes Stairs, MD;  Location: [facility];  Service: Orthopedics;  Laterality: Left;  60 mins   LAPAROSCOPIC CHOLECYSTECTOMY  2001   SHOULDER ARTHROSCOPY Left 2008   spur   Patient Active Problem List   Diagnosis Date Noted   Iron deficiency anemia 01/13/2021   Thyroid nodule 01/13/2021   Acute sore throat 01/13/2021   Depression 01/13/2021   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 Yes   SHORT TERM GOALS: (STG required if POC>30 days)   STG Name Target Date Goal status  1 Pt will be independent with HEP for LUE shoulder ROM   Baseline:  02/11/2021   ONGOING  2 Pt will be independent with any splints and/or braces PRN.   Baseline: ligament injury in RUE.  Issued [place] splint for RUE (01/24/21) 02/11/2021   ONGOING  3 Pt will verbalize increasing independence and ease with ADLs with decrease in pain (no greater than 4/10 with ADLs)   Baseline: 4/10 at eval 02/11/2021   INITIAL  4 Pt will report pain no greater than 8/10 with completing HEP.   Baseline: reported 10/10 pain with AROM at eval.  Reported 7/10 during exercises (supine) on 01/24/21 02/11/2021   ONGOING  5 Pt will increase range of motion in LUE shoulder flexion to 25 degrees or greater for active low level reaching   Baseline: 0* active range of motion 02/11/2021   INITIAL    LONG TERM GOALS:   LTG Name Target Date Goal status  1 Pt will  be independent with all updated HEPs   Baseline:  03/11/2021   INITIAL  2 Pt will improve grip strength in LUE to 25 lbs or greater for increasing functional use of LUE.    Baseline: R 45.6 lbs, L 15.2 lbs 03/11/2021   INITIAL  3 Pt will complete work simulated tasks (I.e. lifting pt leg with lymphadema while wrapping, etc) with 90% accuracy   Baseline:  03/11/2021   INITIAL  4 Pt will verbalize understanding of sleep positions for increasing comfort and decreasing pain during sleep.   Baseline:   03/11/2021   INITIAL  5 Pt will demonstrate improved external rotation range of motion to complete bathing and toilet hygiene more thoroughly   Baseline: unable to do 03/11/2021   INITIAL  6 Pt will improve strength and coordination in LUE as evidenced by being able to lift pots and place on stove, etc during cooking tasks.   Baseline: 03/11/2021   INITIAL    PLAN: OT FREQUENCY: 2x/week   OT DURATION: 10 weeks (16 visits over 10 weeks d/t allowing for any missed visits)   PLANNED INTERVENTIONS: self care/ADL training, therapeutic exercise, therapeutic activity, neuromuscular re-education, manual therapy, passive range of motion, aquatic therapy, splinting, ultrasound, traction, moist heat, cryotherapy, patient/family education, energy conservation and DME and/or AE instructions   PLAN FOR NEXT SESSION: DISCHARGE OT  RECOMMENDED OTHER SERVICES: N/A right now   CONSULTED AND AGREED WITH PLAN OF CARE: Patient    Perry Mount, OTR/L  03/16/2021, 11:35 AM    [place] [place] [address] [place], [place], [address] Phone: [phone]   Fax:  [phone]  Patient name: Yesenia Wheeler MRN: [id number] DOB: [birth year]

## 2021-03-23 ENCOUNTER — Ambulatory Visit: Payer: Self-pay | Admitting: Behavioral Health

## 2021-03-23 ENCOUNTER — Telehealth: Payer: Self-pay | Admitting: *Deleted

## 2021-03-23 DIAGNOSIS — F331 Major depressive disorder, recurrent, moderate: Secondary | ICD-10-CM

## 2021-03-23 DIAGNOSIS — F419 Anxiety disorder, unspecified: Secondary | ICD-10-CM

## 2021-03-23 DIAGNOSIS — Z630 Problems in relationship with spouse or partner: Secondary | ICD-10-CM

## 2021-03-23 NOTE — Telephone Encounter (Signed)
I was asked by Dr Theodis Shove to call pt about her DVT LLE which may be worse; she had a telehealth appt w/Dr Theodis Shove today. 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 Stated it's painful to walk, her ankle is swollen but not warm to touch; she has been taking Eliquis as prescribed. Agree to come tomorrow - appt schedule w/Dr Rehman tomorrow 03/24/21 @ 1315 PM. Pt aware to go UC/ED if symptoms worsen.

## 2021-03-23 NOTE — BH Specialist Note (Signed)
Integrated Behavioral Health via Telemedicine Visit  03/23/2021 Yesenia Wheeler [id number]  Number of [place] visits: 2/6 Session Start time: 2:00pm  Session End time: 2:30pm Total time: 30  Referring Provider: Dr. Cato Mulligan, MD Patient/Family location: Pt in private [facility] Provider location: [facility] Office All persons participating in visit: Pt & Clinician Types of Service: Individual psychotherapy  I connected with Yesenia Wheeler and/or Yesenia Wheeler  self  via  Telephone or Geologist, engineering  (Video is Caregility application) and verified that I am speaking with the correct person using two identifiers. Discussed confidentiality:  3rd visit  I discussed the limitations of telemedicine and the availability of in person appointments.  Discussed there is a possibility of technology failure and discussed alternative modes of communication if that failure occurs.  I discussed that engaging in this telemedicine visit, they consent to the provision of behavioral healthcare and the services will be billed under their insurance.  Patient and/or legal guardian expressed understanding and consented to Telemedicine visit:  3rd visit  Presenting Concerns: Patient and/or family reports the following symptoms/concerns: elevated concerns for health of marital relationship; Pt is making plans to move out of the home w/her Son. Pt will have support of her Mother. Pt has reached a limit on her tolerance for placing her own welfare last.  Duration of problem: months to yrs; Severity of problem: moderate  Patient and/or Family's Strengths/Protective Factors: Social and Emotional competence, Concrete supports in place (healthy food, safe environments, etc.), and Sense of purpose  Goals Addressed: Patient will:  Reduce symptoms of: anxiety, depression, stress, and marital dissatisfaction    Increase knowledge and/or ability of: coping skills,  stress reduction, and marital conflict cessation & exit strategy    Demonstrate ability to: Increase healthy adjustment to current life circumstances, Increase adequate support systems for patient/family, and set healthy boundaries that assist Pt to a state of mental well-being  Progress towards Goals: Ongoing  Interventions: Interventions utilized:  Solution-Focused Strategies, Behavioral Activation, and Supportive Counseling Standardized Assessments completed:  screeners prn  Patient and/or Family Response: Pt is receptive to call but taking a Ct to their home. Appt was reduced to 15 min ck-in  Assessment: Patient currently experiencing elevated dissatisfaction w/the unhealthy state of her marriage.   Patient may benefit from cont'd support to make changes that benefit Pt & her Son.  Plan: Follow up with behavioral health clinician on : 2-3 wks for 60 in telehealth Behavioral recommendations: Make Hematology a/o PCP appt to tend to issues w/her DVT Sx Referral(s): [place] (In Clinic) and f/u with Hematology & [facility]  I discussed the assessment and treatment plan with the patient and/or parent/guardian. They were provided an opportunity to ask questions and all were answered. They agreed with the plan and demonstrated an understanding of the instructions.   They were advised to call back or seek an in-person evaluation if the symptoms worsen or if the condition fails to improve as anticipated.  Donnetta Hutching, LMFT

## 2021-03-24 ENCOUNTER — Ambulatory Visit (INDEPENDENT_AMBULATORY_CARE_PROVIDER_SITE_OTHER): Payer: PRIVATE HEALTH INSURANCE | Admitting: Internal Medicine

## 2021-03-24 VITALS — BP 139/87 | HR 69 | Temp 98.3°F | Wt 216.8 lb

## 2021-03-24 DIAGNOSIS — I82402 Acute embolism and thrombosis of unspecified deep veins of left lower extremity: Secondary | ICD-10-CM

## 2021-03-24 NOTE — Assessment & Plan Note (Signed)
Patient presents today for evaluation of her left lower leg swelling and pain.  States this started about 2 weeks ago after she took a long road trip to visit her son.  She took about 8-1/2-hour road trip states that she knew this would happen.  She tried to take frequent breaks to move her legs.  She states that she has had 4 previous DVTs in the left lower extremity.  She follows with a hematologist and was told that she has significant damage to her left lower leg veins which can result in this recurrence.  She also has inherited thrombophilia.  She has not missed a dose of her Eliquis.  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  Recommended she continue taking her Eliquis and we will obtain a vascular ultrasound of her left lower extremity.  Depending on these results she may need to see vascular surgery.

## 2021-03-24 NOTE — Progress Notes (Signed)
   CC: Left leg pain and swelling  HPI:  [name] is a 50 y.o. with a past medical history listed below presenting for evaluation of left leg pain and swelling.  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 , complex sclerosing lesion   Migraines    Mild asthma    OA (osteoarthritis)    bilateral knees   Parsonage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    SBO (small bowel obstruction) ([place]) 09/2020   Review of Systems:   Negative except as per assessment and plan  Physical Exam:  Vitals:   03/24/21 1323  BP: 139/87  Pulse: 69  Temp: 98.3 F (36.8 C)  TempSrc: Oral  SpO2: 100%  Weight: 216 lb 12.8 oz (98.3 kg)   Physical Exam General: alert, appears stated age, in no acute distress HEENT: Normocephalic, atraumatic, EOM intact, conjunctiva normal CV: Regular rate and rhythm, no murmurs rubs or gallops Pulm: Clear to auscultation bilaterally, normal work of breathing Abdomen: Soft, nondistended, bowel sounds present, no tenderness to palpation MSK: Left lower extremity nonpitting edema, tender to palpation, no right leg swelling Skin: Warm and dry Neuro: Alert and oriented x3   Assessment & Plan:   See Encounters Tab for problem based charting.  Patient discussed with Dr. Philipp Ovens

## 2021-03-29 NOTE — Progress Notes (Signed)
Internal Medicine Clinic Attending ° °Case discussed with Dr. Rehman  At the time of the visit.  We reviewed the resident’s history and exam and pertinent patient test results.  I agree with the assessment, diagnosis, and plan of care documented in the resident’s note.  ° °

## 2021-03-29 NOTE — Addendum Note (Signed)
Addended by: Mike Craze on: 03/29/2021 02:10 PM   Modules accepted: Orders

## 2021-04-01 ENCOUNTER — Inpatient Hospital Stay: Payer: PRIVATE HEALTH INSURANCE | Attending: Oncology | Admitting: Oncology

## 2021-04-01 ENCOUNTER — Other Ambulatory Visit: Payer: Self-pay

## 2021-04-01 VITALS — BP 133/87 | HR 67 | Temp 99.2°F | Resp 20 | Ht 64.0 in | Wt 216.5 lb

## 2021-04-01 DIAGNOSIS — Z7901 Long term (current) use of anticoagulants: Secondary | ICD-10-CM | POA: Diagnosis not present

## 2021-04-01 DIAGNOSIS — Z86718 Personal history of other venous thrombosis and embolism: Secondary | ICD-10-CM

## 2021-04-01 DIAGNOSIS — I824Y9 Acute embolism and thrombosis of unspecified deep veins of unspecified proximal lower extremity: Secondary | ICD-10-CM

## 2021-04-01 NOTE — Progress Notes (Signed)
Hematology and Oncology Follow Up Visit  Yesenia Wheeler [id number] 16-Aug-1970 50 y.o. 04/01/2021 9:55 AM Rehman, Charlsie Quest, DORehman, Areeg N, DO   Principle Diagnosis: 50 year old woman with recurrent venous thromboembolism for many years with the most recent 1 in July 2021.  Hypercoagulable work-up is suspicious for decreased protein S and protein C activity.     Current therapy: Eliquis 5 mg twice a day.  Interim History: Ms. Yesenia Wheeler returns today for a follow-up visit.  Since her last visit, she reports no major changes in her health.  She was in a long car ride and developed swelling and pain in her left lower extremity.  That has resolved at this time without any residual issues.  She denies any shortness of breath or difficulty breathing.  She denies any hospitalization or illnesses.     Medications: I have reviewed the patient's current medications.  Current Outpatient Medications  Medication Sig Dispense Refill   albuterol (VENTOLIN HFA) 108 (90 Base) MCG/ACT inhaler Inhale into the lungs every 6 (six) hours as needed for wheezing or shortness of breath.     apixaban (ELIQUIS) 5 MG TABS tablet Take 1 tablet (5 mg total) by mouth 2 (two) times daily. 60 tablet 0   gabapentin (NEURONTIN) 300 MG capsule 1 capsule in the morning and midday, 2 at night 120 capsule 3   Iron Polysacch Cmplx-B12-FA (POLY-IRON 150 FORTE) 150-0.025-1 MG CAPS Take 150 mg by mouth daily. 30 capsule 0   sertraline (ZOLOFT) 25 MG tablet Take 1 tablet (25 mg total) by mouth daily. 30 tablet 0   No current facility-administered medications for this visit.     Allergies:  Allergies  Allergen Reactions   Hydrocodone      Physical Exam: Blood pressure 133/87, pulse 67, temperature 99.2 F (37.3 C), temperature source Oral, resp. rate 20, height '5\' 4"'$  (1.626 m), weight 216 lb 8 oz (98.2 kg), SpO2 100 %.  ECOG: 1    General appearance: Comfortable appearing without any discomfort Head:  Normocephalic without any trauma Oropharynx: Mucous membranes are moist and pink without any thrush or ulcers. Eyes: Pupils are equal and round reactive to light. Lymph nodes: No cervical, supraclavicular, inguinal or axillary lymphadenopathy.   Heart:regular rate and rhythm.  S1 and S2 without leg edema. Lung: Clear without any rhonchi or wheezes.  No dullness to percussion. Abdomin: Soft, nontender, nondistended with good bowel sounds.  No hepatosplenomegaly. Musculoskeletal: No joint deformity or effusion.  Full range of motion noted. Neurological: No deficits noted on motor, sensory and deep tendon reflex exam.     Lab Results: Lab Results  Component Value Date   WBC 5.5 12/31/2020   HGB 8.0 (L) 12/31/2020   HCT 25.0 (L) 12/31/2020   MCV 72.7 (L) 12/31/2020   PLT 241 12/31/2020     Chemistry      Component Value Date/Time   NA 138 01/13/2021 1022   K 3.9 01/13/2021 1022   CL 109 01/13/2021 1022   CO2 21 (L) 01/13/2021 1022   BUN 6 01/13/2021 1022   CREATININE 0.91 01/13/2021 1022   CREATININE 1.02 03/15/2015 1554      Component Value Date/Time   CALCIUM 8.8 (L) 01/13/2021 1022   ALKPHOS 90 12/31/2020 0207   AST 16 12/31/2020 0207   ALT 11 12/31/2020 0207   BILITOT 0.5 12/31/2020 0207          Impression and Plan:   50 year old woman with   1.  Venous thromboembolism with recurrent presentations  dating back over 20 years.  Her most recent episode was in July 2021.    Her disease status was updated including review of her hypercoagulable work-up which included a mild decline in functional protein S and protein C although it is unclear if clinical significance.  She definitely requires long-term anticoagulation at this time.  I do not see any evidence of Eliquis failure to warrant switching to a different anticoagulation method.  Alternative options such as Lovenox and warfarin could be considered if she develops Eliquis failure.  2.  Left lower extremity pain  and swelling: Likely related to previous thrombosis and venous insufficiency associated with that.  We have discussed strategies to improve blood flow in addition to long-term anticoagulation.  Compression stockings, feet elevation as well as avoiding prolonged immobilization including stretching, and adequate hydration on long trips.    3.  Follow-up: As needed in the future.     30  minutes were were dedicated to this encounter.  The time was spent on reviewing laboratory data, disease status update and management choices for the future.     Zola Button, MD 8/19/20229:55 AM

## 2021-04-21 ENCOUNTER — Ambulatory Visit: Payer: Self-pay | Admitting: Behavioral Health

## 2021-05-09 ENCOUNTER — Other Ambulatory Visit: Payer: Self-pay

## 2021-05-09 ENCOUNTER — Ambulatory Visit: Payer: PRIVATE HEALTH INSURANCE | Admitting: Behavioral Health

## 2021-05-18 ENCOUNTER — Emergency Department (HOSPITAL_COMMUNITY): Payer: PRIVATE HEALTH INSURANCE

## 2021-05-18 ENCOUNTER — Other Ambulatory Visit: Payer: Self-pay

## 2021-05-18 ENCOUNTER — Encounter (HOSPITAL_COMMUNITY): Payer: Self-pay

## 2021-05-18 ENCOUNTER — Emergency Department (HOSPITAL_COMMUNITY)
Admission: EM | Admit: 2021-05-18 | Discharge: 2021-05-19 | Disposition: A | Discharge status: Home / Self Care | Payer: PRIVATE HEALTH INSURANCE | Attending: Emergency Medicine | Admitting: Emergency Medicine

## 2021-05-18 DIAGNOSIS — Z87891 Personal history of nicotine dependence: Secondary | ICD-10-CM | POA: Insufficient documentation

## 2021-05-18 DIAGNOSIS — J45909 Unspecified asthma, uncomplicated: Secondary | ICD-10-CM | POA: Insufficient documentation

## 2021-05-18 DIAGNOSIS — Z7901 Long term (current) use of anticoagulants: Secondary | ICD-10-CM | POA: Diagnosis not present

## 2021-05-18 DIAGNOSIS — R072 Precordial pain: Secondary | ICD-10-CM | POA: Insufficient documentation

## 2021-05-18 DIAGNOSIS — R42 Dizziness and giddiness: Secondary | ICD-10-CM | POA: Insufficient documentation

## 2021-05-18 DIAGNOSIS — R0789 Other chest pain: Secondary | ICD-10-CM

## 2021-05-18 NOTE — ED Provider Notes (Signed)
Emergency Medicine Provider Triage Evaluation Note  Yesenia Wheeler , a 50 y.o. female  was evaluated in triage.  Pt complains of mid chest pressure starting earlier today.  She has a history of DVT and is on Eliquis.  She states compliance.  She states that she was sitting when the symptoms started and she has intermittent dizzy spells described as vertigo.  Pressure has not changed with eating or drinking.  She has not had diaphoresis, vomiting.  Pain is nonexertional and does not radiate.  Review of Systems  Positive: Chest pressure Negative: Vomiting  Physical Exam  There were no vitals taken for this visit. Gen:   Awake, no distress   Resp:  Normal effort  MSK:   Moves extremities without difficulty  Other:  Cardiac heart rate in 70s, regular  Medical Decision Making  Medically screening exam initiated at 11:13 PM.  Appropriate orders placed.  Lorree Millar was informed that the remainder of the evaluation will be completed by another provider, this initial triage assessment does not replace that evaluation, and the importance of remaining in the ED until their evaluation is complete.  Plan: Cardiac work-up   Carlisle Cater, PA-C 05/18/21 2314    Drenda Freeze, MD 05/19/21 [phone]

## 2021-05-18 NOTE — ED Triage Notes (Signed)
Pt complains of chest pain and dizziness since today.

## 2021-05-19 LAB — CBC
HCT: 34.9 % — ABNORMAL LOW (ref 36.0–46.0)
Hemoglobin: 10.9 g/dL — ABNORMAL LOW (ref 12.0–15.0)
MCH: 25.3 pg — ABNORMAL LOW (ref 26.0–34.0)
MCHC: 31.2 g/dL (ref 30.0–36.0)
MCV: 81 fL (ref 80.0–100.0)
Platelets: 245 10*3/uL (ref 150–400)
RBC: 4.31 MIL/uL (ref 3.87–5.11)
RDW: 16.1 % — ABNORMAL HIGH (ref 11.5–15.5)
WBC: 5.6 10*3/uL (ref 4.0–10.5)
nRBC: 0 % (ref 0.0–0.2)

## 2021-05-19 LAB — COMPREHENSIVE METABOLIC PANEL
ALT: 13 U/L (ref 0–44)
AST: 20 U/L (ref 15–41)
Albumin: 3.5 g/dL (ref 3.5–5.0)
Alkaline Phosphatase: 109 U/L (ref 38–126)
Anion gap: 5 (ref 5–15)
BUN: 13 mg/dL (ref 6–20)
CO2: 21 mmol/L — ABNORMAL LOW (ref 22–32)
Calcium: 8.8 mg/dL — ABNORMAL LOW (ref 8.9–10.3)
Chloride: 113 mmol/L — ABNORMAL HIGH (ref 98–111)
Creatinine, Ser: 0.97 mg/dL (ref 0.44–1.00)
GFR, Estimated: >60 mL/min (ref >60)
Glucose, Bld: 93 mg/dL (ref 70–99)
Potassium: 3.6 mmol/L (ref 3.5–5.1)
Sodium: 139 mmol/L (ref 135–145)
Total Bilirubin: 0.6 mg/dL (ref 0.3–1.2)
Total Protein: 7.2 g/dL (ref 6.5–8.1)

## 2021-05-19 LAB — TROPONIN I (HIGH SENSITIVITY)
Troponin I (High Sensitivity): 4 ng/L (ref <18)
Troponin I (High Sensitivity): 4 ng/L (ref <18)

## 2021-05-19 MED ORDER — CYCLOBENZAPRINE HCL 5 MG PO TABS: 5.0000 mg | ORAL_TABLET | Freq: Three times a day (TID) | ORAL | 0 refills | Status: DC | PRN

## 2021-05-19 MED ORDER — MECLIZINE HCL 25 MG PO TABS: 25.0000 mg | ORAL_TABLET | Freq: Three times a day (TID) | ORAL | 0 refills | Status: DC | PRN

## 2021-05-19 MED ORDER — MECLIZINE HCL 25 MG PO TABS
25.0000 mg | ORAL_TABLET | Freq: Once | ORAL | Status: AC
  Administered 2021-05-19: 25 mg via ORAL
  Filled 2021-05-19: qty 1

## 2021-05-19 NOTE — ED Provider Notes (Signed)
[facility] DEPT Provider Note   CSN: [id number] Arrival date & time: 05/18/21  2253     History Chief Complaint  Patient presents with   Dizziness   Chest Pain    Yesenia Wheeler is a 50 y.o. female history of DVT on Eliquis, Parsonage- Turner syndrome, presenting with dizziness and chest pain.  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  She had midsternal chest pain started this morning.  She states it is worse with movement.  Patient also was complaining of dizziness.  She states that she has vertiginous symptoms going on for the last several weeks.  She denies any trouble speaking or weakness or numbness.  Patient has seen neurology previously.  She had a MRI brain in May that was negative.  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   The history is provided by the patient.      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 , complex sclerosing lesion   Migraines    Mild asthma    OA (osteoarthritis)    bilateral knees   Parsonage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    SBO (small bowel obstruction) ([place]) 09/2020    Patient Active Problem List   Diagnosis Date Noted   Iron deficiency anemia 01/13/2021   Thyroid nodule 01/13/2021   Depression 01/13/2021   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   DVT, lower extremity, recurrent ([place]) 12/30/2020   Left breast mass 10/23/2017   Acute medial meniscus tear of left knee 08/30/2017   Obesity (BMI 30-39.9) 05/26/2014   Asthma, chronic 05/25/2014   Migraine 05/25/2014   Chronic back pain 05/25/2014    Past Surgical History:  Procedure Laterality Date   BREAST LUMPECTOMY WITH  RADIOACTIVE SEED LOCALIZATION Left 10/23/2017   Procedure: BREAST LUMPECTOMY WITH RADIOACTIVE SEED LOCALIZATION;  Surgeon: Erroll Luna, MD;  Location: [facility];  Service: General;  Laterality: Left;   BREAST REDUCTION SURGERY Bilateral 10/23/2017   Procedure: LEFT BREAST ONCOPLASTIC REDUCTION, RIGHT BREAST REDUCTION;  Surgeon: Irene Limbo, MD;  Location: [facility];  Service: Plastics;  Laterality: Bilateral;   CESAREAN SECTION  2011   GASTRIC BYPASS  2001   KNEE ARTHROSCOPY W/ MENISCECTOMY Right 2007   KNEE ARTHROSCOPY WITH MEDIAL MENISECTOMY Left 08/30/2017   Procedure: KNEE ARTHROSCOPY WITH PARTIAL MEDIAL MENISECTOMY;  Surgeon: Nicholes Stairs, MD;  Location: [facility];  Service: Orthopedics;  Laterality: Left;  60 mins   LAPAROSCOPIC CHOLECYSTECTOMY  2001   SHOULDER ARTHROSCOPY Left 2008   spur     OB History     Gravida  46   Para      Term      Preterm      AB  14   Living  3      SAB  14   IAB      Ectopic      Multiple      Live Births              Family History  Problem Relation Age of Onset   Cancer Father  oral    Social History   Tobacco Use   Smoking status: Former    Years: 6.00    Types: Cigarettes    Quit date: 06/23/2017    Years since quitting: 3.9   Smokeless tobacco: Never   Tobacco comments:    2ppwk  Vaping Use   Vaping Use: Former  Substance Use Topics   Alcohol use: Yes    Comment: occasional   Drug use: No    Home Medications Prior to Admission medications   Medication Sig Start Date End Date Taking? Authorizing Provider  albuterol (VENTOLIN HFA) 108 (90 Base) MCG/ACT inhaler Inhale into the lungs every 6 (six) hours as needed for wheezing or shortness of breath.    [provider]  apixaban (ELIQUIS) 5 MG TABS tablet Take 1 tablet (5 mg total) by mouth 2 (two) times daily. 01/13/21   Cato Mulligan, MD  gabapentin (NEURONTIN) 300 MG capsule  1 capsule in the morning and midday, 2 at night 03/15/21   Kathrynn Ducking, MD  Iron Polysacch Cmplx-B12-FA (POLY-IRON 150 FORTE) 150-0.025-1 MG CAPS Take 150 mg by mouth daily. 01/13/21   Cato Mulligan, MD  sertraline (ZOLOFT) 25 MG tablet Take 1 tablet (25 mg total) by mouth daily. 01/13/21 02/12/21  Cato Mulligan, MD  enoxaparin (LOVENOX) 40 MG/0.4ML injection Inject 0.4 mLs (40 mg total) into the skin daily. Start 3.14.19 Patient not taking: Reported on 04/30/2018 10/24/17 03/16/20  Irene Limbo, MD    Allergies    Hydrocodone  Review of Systems   Review of Systems  Cardiovascular:  Positive for chest pain.  Neurological:  Positive for dizziness.  All other systems reviewed and are negative.  Physical Exam Updated Vital Signs BP 130/82   Pulse 74   Temp 98.3 F (36.8 C) (Oral)   Resp 18   Ht 5\' 4"  (1.626 m)   Wt 98 kg   SpO2 100%   BMI 37.08 kg/m   Physical Exam Vitals and nursing note reviewed.  HENT:     Head: Normocephalic.  Eyes:     Extraocular Movements: Extraocular movements intact.     Pupils: Pupils are equal, round, and reactive to light.  Cardiovascular:     Rate and Rhythm: Normal rate and regular rhythm.     Heart sounds: Normal heart sounds.  Pulmonary:     Effort: Pulmonary effort is normal.     Breath sounds: Normal breath sounds.     Comments: Reproducible chest wall tenderness Abdominal:     Palpations: Abdomen is soft.  Musculoskeletal:        General: Normal range of motion.     Cervical back: Normal range of motion and neck supple.  Skin:    General: Skin is warm.     Capillary Refill: Capillary refill takes less than 2 seconds.  Neurological:     General: No focal deficit present.     Mental Status: She is alert and oriented to person, place, and time.     Comments: Horizontal nystagmus to the left.  No rotatory nystagmus.  Patient has normal finger-to-nose bilaterally.  Patient has no pronator drift.  Patient has normal and steady gait   Psychiatric:        Mood and Affect: Mood normal.        Behavior: Behavior normal.    ED Results / Procedures / Treatments   Labs (all labs ordered are listed, but only abnormal results are displayed) Labs Reviewed  CBC - Abnormal; Notable  for the following components:      Result Value   Hemoglobin 10.9 (*)    HCT 34.9 (*)    MCH 25.3 (*)    RDW 16.1 (*)    All other components within normal limits  COMPREHENSIVE METABOLIC PANEL - Abnormal; Notable for the following components:   Chloride 113 (*)    CO2 21 (*)    Calcium 8.8 (*)    All other components within normal limits  TROPONIN I (HIGH SENSITIVITY)  TROPONIN I (HIGH SENSITIVITY)    EKG EKG Interpretation  Date/Time:  Wednesday May 18 2021 23:12:21 EDT Ventricular Rate:  71 PR Interval:  119 QRS Duration: 78 QT Interval:  370 QTC Calculation: 402 R Axis:   26 Text Interpretation: Sinus rhythm Borderline short PR interval Abnormal R-wave progression, early transition No significant change since last tracing Confirmed by Wandra Arthurs [phone]) on 05/19/2021 2:28:24 AM  Radiology DG Chest 2 View  Result Date: 05/18/2021 CLINICAL DATA:  Chest pain and pressure for 1 day EXAM: CHEST - 2 VIEW COMPARISON:  08/23/2019 FINDINGS: The heart size and mediastinal contours are within normal limits. Both lungs are clear. The visualized skeletal structures are unremarkable. IMPRESSION: No active cardiopulmonary disease. Electronically Signed   By: Inez Catalina M.D.   On: 05/18/2021 23:46    Procedures Procedures   Medications Ordered in ED Medications  meclizine (ANTIVERT) tablet 25 mg (25 mg Oral Given 05/19/21 0316)    ED Course  I have reviewed the triage vital signs and the nursing notes.  Pertinent labs & imaging results that were available during my care of the patient were reviewed by me and considered in my medical decision making (see chart for details).    MDM Rules/Calculators/A&P                            Takasha Vetere is a 50 y.o. female here presenting with chest pain and dizziness.  Dizziness for the last several weeks.  I think likely peripheral vertigo.  She has normal gait and normal neuro exam and I do not think she has a posterior circulation stroke right now so we will hold off imaging.  Patient does have reproducible chest wall tenderness and have low suspicion for ACS.  Will get troponin x2 and labs.  Will give meclizine.   5:02 AM Patient's troponin is negative x2.  Labs otherwise unremarkable.  Felt better after meclizine.  I think the chest pain is likely costochondritis.  She also has peripheral vertigo.  Will give muscle relaxants and meclizine.  We will have her follow-up with her neurologist and primary care doctor  Final Clinical Impression(s) / ED Diagnoses Final diagnoses:  None    Rx / DC Orders ED Discharge Orders     None        Drenda Freeze, MD 05/19/21 [phone]

## 2021-05-19 NOTE — Discharge Instructions (Addendum)
Take meclizine for dizziness.  Your heart enzyme tests are normal right now.  I think you likely have muscle strain.  Take Flexeril for muscle spasms  Follow-up with your neurologist for dizziness  Return to ER if you have worse chest pain, dizziness, trouble speaking, weakness.

## 2021-10-18 ENCOUNTER — Encounter (HOSPITAL_BASED_OUTPATIENT_CLINIC_OR_DEPARTMENT_OTHER): Payer: Self-pay | Admitting: Emergency Medicine

## 2021-10-18 ENCOUNTER — Other Ambulatory Visit (HOSPITAL_BASED_OUTPATIENT_CLINIC_OR_DEPARTMENT_OTHER): Payer: Self-pay

## 2021-10-18 ENCOUNTER — Emergency Department (HOSPITAL_BASED_OUTPATIENT_CLINIC_OR_DEPARTMENT_OTHER)
Admission: EM | Admit: 2021-10-18 | Discharge: 2021-10-18 | Disposition: A | Discharge status: Home / Self Care | Payer: PRIVATE HEALTH INSURANCE | Attending: Emergency Medicine | Admitting: Emergency Medicine

## 2021-10-18 ENCOUNTER — Other Ambulatory Visit: Payer: Self-pay

## 2021-10-18 DIAGNOSIS — M549 Dorsalgia, unspecified: Secondary | ICD-10-CM

## 2021-10-18 DIAGNOSIS — Z7901 Long term (current) use of anticoagulants: Secondary | ICD-10-CM | POA: Diagnosis not present

## 2021-10-18 DIAGNOSIS — M546 Pain in thoracic spine: Secondary | ICD-10-CM | POA: Diagnosis not present

## 2021-10-18 MED ORDER — CYCLOBENZAPRINE HCL 10 MG PO TABS
10.0000 mg | ORAL_TABLET | Freq: Two times a day (BID) | ORAL | 0 refills | Status: DC | PRN
  Filled 2021-10-18: qty 20, 10d supply, fill #0

## 2021-10-18 MED ORDER — IBUPROFEN 800 MG PO TABS
800.0000 mg | ORAL_TABLET | Freq: Once | ORAL | Status: AC
  Administered 2021-10-18: 800 mg via ORAL
  Filled 2021-10-18: qty 1

## 2021-10-18 MED ORDER — OXYCODONE-ACETAMINOPHEN 5-325 MG PO TABS
2.0000 | ORAL_TABLET | Freq: Once | ORAL | Status: AC
  Administered 2021-10-18: 2 via ORAL
  Filled 2021-10-18: qty 2

## 2021-10-18 NOTE — Discharge Instructions (Addendum)
Please add ibuprofen 600 mg 3 times a day to your Flexeril for the next 4 to 5 days ?Return if you are having worsening pain, weakness on one side or the other, loss of bowel or bladder control or other new symptoms ?Please call your doctor today for checkup as soon as possible. ?

## 2021-10-18 NOTE — ED Triage Notes (Signed)
Pt c/o back pain that starts at the top of her back that radiates bilaterally into both hips. Pt complains of constant pain 10 out of 10. Pt denis N/V. ?

## 2021-10-18 NOTE — ED Provider Notes (Signed)
?[facility] EMERGENCY DEPT ?Provider Note ? ? ?CSN: [id number] ?Arrival date & time: 10/18/21  6073 ? ?  ? ?History ? ?Chief Complaint  ?Patient presents with  ? Back Pain  ? ? ?Yesenia Wheeler is a 51 y.o. female. ? ?HPI ?51 year old female presents today complaining of worsening back pain.  She has some chronic back pain has been followed and previously had an MRI.  She states at that time she had a disc on her MRI.  However it was just treated with skeletal muscle relaxants.  She has not had any weakness, loss of bowel or bladder control.  She states that she previously had loss of sensation on one side and it was thought to be a stroke but then ruled out.  She was then told that she had Parsonage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 which she says was also ruled out.  She had some numbness that occurred during this new episode but has since resolved.  She felt like she was having difficulty getting out of bed for a period of time last night.  It is unclear whether this was due to pain or weakness.  She has been taking Flexeril for pain. ? ?  ? ?Home Medications ?Prior to Admission medications   ?Medication Sig Start Date End Date Taking? Authorizing Provider  ?albuterol (VENTOLIN HFA) 108 (90 Base) MCG/ACT inhaler Inhale into the lungs every 6 (six) hours as needed for wheezing or shortness of breath.    [provider]  ?apixaban (ELIQUIS) 5 MG TABS tablet Take 1 tablet (5 mg total) by mouth 2 (two) times daily. 01/13/21   Cato Mulligan, MD  ?cyclobenzaprine (FLEXERIL) 5 MG tablet Take 1 tablet (5 mg total) by mouth 3 (three) times daily as needed for muscle spasms. 05/19/21   Drenda Freeze, MD  ?gabapentin (NEURONTIN) 300 MG capsule 1 capsule in the morning and midday, 2 at night 03/15/21   Kathrynn Ducking, MD  ?Iron Polysacch Cmplx-B12-FA (POLY-IRON 150 FORTE) 150-0.025-1 MG CAPS Take 150 mg by mouth daily. ?Patient not taking: No sig reported 01/13/21   Cato Mulligan, MD  ?meclizine (ANTIVERT) 25 MG  tablet Take 1 tablet (25 mg total) by mouth 3 (three) times daily as needed for dizziness. 05/19/21   Drenda Freeze, MD  ?sertraline (ZOLOFT) 25 MG tablet Take 1 tablet (25 mg total) by mouth daily. ?Patient not taking: Reported on 05/19/2021 01/13/21 02/12/21  Cato Mulligan, MD  ?enoxaparin (LOVENOX) 40 MG/0.4ML injection Inject 0.4 mLs (40 mg total) into the skin daily. Start 3.14.19 ?Patient not taking: Reported on 04/30/2018 10/24/17 03/16/20  Irene Limbo, MD  ?   ? ?Allergies    ?Hydrocodone   ? ?Review of Systems   ?Review of Systems  ?All other systems reviewed and are negative. ? ?Physical Exam ?Updated Vital Signs ?BP 117/70   Pulse 64   Temp 98.1 ?F (36.7 ?C) (Oral)   Resp 18   Ht 1.626 m ('5\' 4"'$ )   Wt 99.8 kg   LMP 10/02/2021   SpO2 99%   BMI 37.76 kg/m?  ?Physical Exam ?Vitals and nursing note reviewed.  ?Constitutional:   ?   Appearance: Normal appearance. She is well-developed.  ?HENT:  ?   Head: Normocephalic and atraumatic.  ?   Right Ear: Tympanic membrane and external ear normal.  ?   Left Ear: Tympanic membrane and external ear normal.  ?   Nose: Nose normal.  ?   Right Sinus: No maxillary sinus tenderness or frontal sinus tenderness.  ?  Left Sinus: No maxillary sinus tenderness or frontal sinus tenderness.  ?Eyes:  ?   Extraocular Movements:  ?   Right eye: No nystagmus.  ?   Left eye: No nystagmus.  ?   Conjunctiva/sclera: Conjunctivae normal.  ?   Pupils: Pupils are equal, round, and reactive to light.  ?Cardiovascular:  ?   Rate and Rhythm: Normal rate and regular rhythm.  ?   Heart sounds: Normal heart sounds.  ?Pulmonary:  ?   Effort: Pulmonary effort is normal. No respiratory distress.  ?   Breath sounds: Normal breath sounds.  ?Chest:  ?   Chest wall: No tenderness.  ?Abdominal:  ?   General: Bowel sounds are normal. There is no distension.  ?   Palpations: Abdomen is soft. There is no mass.  ?   Tenderness: There is no abdominal tenderness.  ?Musculoskeletal:     ?   General:  No tenderness. Normal range of motion.  ?   Cervical back: Normal range of motion and neck supple.  ?   Comments: Diffuse back tenderness from mid thoracic down to sacrum ?No erythema ?No fluctuance ?No signs or symptoms of infection  ?Skin: ?   General: Skin is warm and dry.  ?   Capillary Refill: Capillary refill takes less than 2 seconds.  ?   Findings: No rash.  ?Neurological:  ?   Mental Status: She is alert and oriented to person, place, and time.  ?   GCS: GCS eye subscore is 4. GCS verbal subscore is 5. GCS motor subscore is 6.  ?   Cranial Nerves: Cranial nerves 2-12 are intact. No cranial nerve deficit.  ?   Sensory: No sensory deficit.  ?   Motor: Motor function is intact. No weakness.  ?   Coordination: Coordination is intact. Coordination normal.  ?   Gait: Gait normal.  ?   Deep Tendon Reflexes: Reflexes normal. Babinski sign absent on the right side. Babinski sign absent on the left side.  ?   Reflex Scores: ?     Bicep reflexes are 1+ on the right side and 1+ on the left side. ?     Patellar reflexes are 2+ on the right side and 2+ on the left side. ?     Achilles reflexes are 1+ on the right side and 1+ on the left side. ?   Comments: Patient with normal gait without ataxia, shuffling, spasm, or antalgia. ?Speech is normal without dysarthria, dysphasia, or aphasia. ?Muscle strength is 5/5 in bilateral shoulders, elbow flexor and extensors, wrist flexor and extensors, and intrinsic hand muscles. ?5/5 bilateral lower extremity hip flexors, extensors, knee flexors and extensors, and ankle dorsi and plantar flexors.    ?Psychiatric:     ?   Behavior: Behavior normal.     ?   Thought Content: Thought content normal.     ?   Judgment: Judgment normal.  ? ? ?ED Results / Procedures / Treatments   ?Labs ?(all labs ordered are listed, but only abnormal results are displayed) ?Labs Reviewed - No data to display ? ?EKG ?None ? ?Radiology ?No results found. ? ?Procedures ?Procedures  ? ? ?Medications Ordered in  ED ?Medications  ?oxyCODONE-acetaminophen (PERCOCET/ROXICET) 5-325 MG per tablet 2 tablet (has no administration in time range)  ?ibuprofen (ADVIL) tablet 800 mg (has no administration in time range)  ? ? ?ED Course/ Medical Decision Making/ A&P ?  ?                        ?  Medical Decision Making ?51 year old female presents today with atraumatic back pain.  She has no risk factors of immunosuppression or injection drug use.  She has diffuse pain on her exam.  She is not febrile and has normal vital signs here in the ED.  Nursing note of heart rate of 2 is not noted on my exam. ?Differential diagnosis includes musculoskeletal back pain, pain from disc disease, acute infection, mass lesion, retroperitoneal or intra-abdominal etiology, etiology.  No evidence of acute nerve damage on exam, no history of IV drug use, autoimmune disorders, and pain has been somewhat chronic although she is having a new flare, so doubt mass lesion.  Abdomen is soft and nontender.  Exam most consistent with musculoskeletal etiology.  Additionally patient has been working 2 jobs 7 days a week and has had poor rest which could contribute to musculoskeletal type pain.  Patient advised regarding cutting back and getting more rest. ?Plan nonsteroidal anti-inflammatories in addition to her Flexeril.  She will be given 2 Percocet here today.  She is followed at the internal medicine clinic and is advised to call for follow-up appointment.  We have discussed return precautions including loss of bowel or bladder control, decreased sensation or strength or other new symptoms ? ? ? ? ? ? ? ? ? ? ? ?Final Clinical Impression(s) / ED Diagnoses ?Final diagnoses:  ?Acute midline back pain, unspecified back location  ? ? ?Rx / DC Orders ?ED Discharge Orders   ? ? None  ? ?  ? ? ?  ?Pattricia Boss, MD ?10/18/21 [phone] ? ?

## 2021-10-26 ENCOUNTER — Encounter: Payer: Self-pay | Admitting: Internal Medicine

## 2021-10-26 ENCOUNTER — Encounter: Payer: PRIVATE HEALTH INSURANCE | Admitting: Internal Medicine

## 2021-10-26 NOTE — Progress Notes (Incomplete)
? ?  CC: back pain ? ?HPI:[name] is a 51 y.o. female who presents for evaluation of ***. Please see individual problem based A/P for details. ? ?Chronic back pain ?- imaging May 22 - MR c spine - normal c spine cord and no significant spinal stenosis despite multilevel disc and endplate degeneration. C4-5 through c6-7 degenerative neural foraminal stenosis which 8is moderate to severe at thie right C5, left c6, and right c7 nerve levels ?- MR chest showing possible denervation potenially reflecting acute  ? ?Hx left breast mass ? ? ?Depression, PHQ-9: ?Based on the patients  ?[place] Office Visit from 01/13/2021 in [place]  ?PHQ-9 Total Score 15  ? ?  ? score we have ***. ? ?Past Medical History:  ?Diagnosis Date  ?? Acute meniscal tear of left knee   ?? Anemia   ?? History of DVT of lower extremity   ?  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 Left breast mass   ? 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 Migraines   ?? Mild asthma   ?? 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 Parsonage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   ?? SBO (small bowel obstruction) ([place]) 09/2020  ? ?Review of Systems:   ?ROS  ? ?Physical Exam: ?There were no vitals filed for this visit. ? ? ?General: *** ?HEENT: Conjunctiva nl , antiicteric sclerae, moist mucous membranes, no exudate or erythema ?Cardiovascular: Normal rate, regular rhythm.  No murmurs, rubs, or gallops ?Pulmonary : Equal breath sounds, No wheezes, rales, or rhonchi ?Abdominal: soft, nontender,  bowel sounds present ?Ext: No edema in lower extremities, no tenderness to palpation of lower extremities.  ? ?Assessment & Plan:  ? ?See Encounters Tab for problem based charting. ? ?Patient {GC/GE:[id number]::"discussed with","seen with"} Dr. {LAGTX:[id number]::"Z. Hoffman","Guilloud","Mullen","Narendra","Raines","Vincent","Williams"} ? ?

## 2021-10-28 ENCOUNTER — Other Ambulatory Visit: Payer: Self-pay

## 2021-10-28 ENCOUNTER — Ambulatory Visit (INDEPENDENT_AMBULATORY_CARE_PROVIDER_SITE_OTHER): Payer: PRIVATE HEALTH INSURANCE | Admitting: Internal Medicine

## 2021-10-28 DIAGNOSIS — I82402 Acute embolism and thrombosis of unspecified deep veins of left lower extremity: Secondary | ICD-10-CM

## 2021-10-28 DIAGNOSIS — F331 Major depressive disorder, recurrent, moderate: Secondary | ICD-10-CM

## 2021-10-28 DIAGNOSIS — M549 Dorsalgia, unspecified: Secondary | ICD-10-CM | POA: Diagnosis not present

## 2021-10-28 DIAGNOSIS — M25561 Pain in right knee: Secondary | ICD-10-CM

## 2021-10-28 DIAGNOSIS — F419 Anxiety disorder, unspecified: Secondary | ICD-10-CM | POA: Diagnosis not present

## 2021-10-28 DIAGNOSIS — M25562 Pain in left knee: Secondary | ICD-10-CM

## 2021-10-28 MED ORDER — DULOXETINE HCL 30 MG PO CPEP: 30.0000 mg | ORAL_CAPSULE | Freq: Every day | ORAL | 1 refills | Status: DC

## 2021-10-28 MED ORDER — APIXABAN 5 MG PO TABS: 5.0000 mg | ORAL_TABLET | Freq: Two times a day (BID) | ORAL | 0 refills | Status: DC

## 2021-10-28 NOTE — Progress Notes (Signed)
? ?  CC: back pain ? ?HPI:Ms.Miesha Bachmann is a 51 y.o. female who presents for evaluation of back pain. Please see individual problem based A/P for details. ? ? ?Depression, PHQ-9: ?Based on the patients  ?[place] Office Visit from 01/13/2021 in [place]  ?PHQ-9 Total Score 15  ? ?  ? score we have . ? ?Past Medical History:  ?Diagnosis Date  ? Acute meniscal tear of left knee   ? Anemia   ? History of DVT of lower extremity   ?  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 Left breast mass   ? bx 11/ 2018 per path , complex sclerosing lesion  ? Migraines   ? Mild asthma   ? 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 Parsonage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   ? SBO (small bowel obstruction) ([place]) 09/2020  ? ?Review of Systems:   ?Review of Systems  ?Constitutional: Negative.   ?Respiratory: Negative.    ?Cardiovascular: Negative.   ?Gastrointestinal: Negative.   ?Genitourinary: Negative.   ?Musculoskeletal:  Positive for back pain and joint pain.  ?Neurological: Negative.   ?Psychiatric/Behavioral:  Positive for depression.    ? ?Physical Exam: ?There were no vitals filed for this visit. ? ? ?General: alert and oriented, NAD ?HEENT: Conjunctiva nl , antiicteric sclerae, moist mucous membranes, no exudate or erythema ?Cardiovascular: Normal rate, regular rhythm.  No murmurs, rubs, or gallops ?Pulmonary : Equal breath sounds, No wheezes, rales, or rhonchi ?Abdominal: soft, nontender,  bowel sounds present ?Back: Diffuse tenderness along spine and paraspinal muscles. No evidence of paraspinal muscle spasm. Full ROM, negative straight leg raise. Strength and sensation intact.  ?Ext: No edema in lower extremities, no tenderness to palpation of lower extremities.  ? ?Assessment & Plan:  ? ?See Encounters Tab for problem based charting. ? ?Patient discussed with Dr. Dareen Piano ? ?

## 2021-10-28 NOTE — Patient Instructions (Addendum)
Dear Yesenia Wheeler, ? ?Thank you for trusting [place] with your care today. ? ?We discussed your back pain and depression.  ? ?We will start you on a medication called duloxetine for your pain. Please continue taking it, even if you do not notice any improvement, but do call our office. Please also use the car seat inserts to help with lumbar support.  ?We will get you back in with Dr. Theodis Shove.  ? ?We also recommend that you continue taking your eliquis.  ? ?We will plan to see you in a month to discuss your knee pain and follow up on your back pain. ?

## 2021-10-29 ENCOUNTER — Encounter: Payer: Self-pay | Admitting: Internal Medicine

## 2021-10-29 DIAGNOSIS — M25569 Pain in unspecified knee: Secondary | ICD-10-CM | POA: Insufficient documentation

## 2021-10-29 NOTE — Assessment & Plan Note (Signed)
Patient sits for extended hours in the car. She has had 4 dvt's and needs to continue eliquis. She has not been taking this.  ?Discussed restarting medication with pt. Sent in refill. ? ?

## 2021-10-29 NOTE — Assessment & Plan Note (Addendum)
Patient reporting chronic back pain ongoing for several years. Describes it as constant pressure along spine, throbbing, and with burning in lateral portion of back. Pain does not radiate into legs. No shooting pain. Worsening over last several months. Was recently evaluated in ED 3/7 for this pain. No trauma, no fever, immunosuppression, or IVDU. Thought to be MSK in nature and given rx for flexeril and percocet which she reports hsa not helped. She reports she has tried tylenol, ibuprofen, naproxen, gabapentin, PT, and thermotherapy for her pain with no relief. She does endorse some relief from pain while taking steroids for Parsonage Turner syndrome. States she sits in car for many hours throughout the day for work and notes exacerbation in pain during that time.   ?No numbness, tingling, bowel, or bladder incontinence.  ?She has had MRI in the past 5/19 which showed normal cord with no stenosis but multilevel disc and endplate degen. There was c4-5 through c6-7 denen neural foraminal stenosis  which is moderate to severe at right c5, left c6, and right c7. Do not believe these findings sufficiently explain her symptoms. Furthermore, her symptoms do not fit for nerve pain. Negative straight leg raise. She endorsed tenderness along paraspinal muscles but had no evidence of muscle spasm.  ?Possibly functional component to pain.  ?She has been on sertraline in the past for concomitant depression But reported no improvement with that medication. We will switch her to duloxetine. Discussed continuing medication with her even if she notices no improvement. We will uptitrate as necessary.  ?Long periods in car may also be exacerbating pain. Advised she purchase lumbar support for car.  ?We will plan to re-evaluate at follow up ?

## 2021-10-29 NOTE — Assessment & Plan Note (Signed)
Patient reports chronic depression. Has tried sertraline in the past but noted no benefit from this. She has followed with Dr. Theodis Shove in the past and noted improvement with this. ? ?We will switch her to duloxetine. Discussed uptitrating as necessary.  ?Referral to [facility] placed.  ?

## 2021-10-29 NOTE — Assessment & Plan Note (Signed)
Patient reports chronic knee pain related to meniscal tears. She states she has had both knees repaired twice. She is not interested in seeing ortho again since repairs have failed.  ?Discussed addressing this complaint at follow up visit due to time constraints.  ?

## 2021-10-31 NOTE — Progress Notes (Signed)
Internal Medicine Clinic Attending ° °Case discussed with Dr. Gawaluck  At the time of the visit.  We reviewed the resident’s history and exam and pertinent patient test results.  I agree with the assessment, diagnosis, and plan of care documented in the resident’s note.  °

## 2021-11-15 ENCOUNTER — Encounter: Payer: Self-pay | Admitting: Internal Medicine

## 2021-11-24 ENCOUNTER — Other Ambulatory Visit: Payer: Self-pay | Admitting: Internal Medicine

## 2021-12-13 ENCOUNTER — Telehealth: Payer: Self-pay | Admitting: Behavioral Health

## 2021-12-13 ENCOUNTER — Ambulatory Visit: Payer: PRIVATE HEALTH INSURANCE | Admitting: Behavioral Health

## 2021-12-13 DIAGNOSIS — F331 Major depressive disorder, recurrent, moderate: Secondary | ICD-10-CM

## 2021-12-13 DIAGNOSIS — F419 Anxiety disorder, unspecified: Secondary | ICD-10-CM

## 2021-12-13 NOTE — BH Specialist Note (Signed)
Pt will need to r/s. ? ?Dr. Theodis Shove ?

## 2021-12-13 NOTE — Telephone Encounter (Signed)
Unsuccessful attempt to reach Pt for [facility] telehealth session. Pt's phone sts, "Mailbox is full". Pt can RC & r/s as needed.  ? ?Dr. Theodis Shove ?

## 2021-12-19 ENCOUNTER — Ambulatory Visit: Payer: PRIVATE HEALTH INSURANCE | Admitting: Behavioral Health

## 2021-12-19 DIAGNOSIS — Z630 Problems in relationship with spouse or partner: Secondary | ICD-10-CM

## 2021-12-19 DIAGNOSIS — F331 Major depressive disorder, recurrent, moderate: Secondary | ICD-10-CM

## 2021-12-19 DIAGNOSIS — F419 Anxiety disorder, unspecified: Secondary | ICD-10-CM

## 2021-12-19 NOTE — BH Specialist Note (Signed)
Integrated Behavioral Health via Telemedicine Visit ? ?12/19/2021 ?Yesenia Wheeler ?[id number] ? ?Number of [place] Clinician visits: 3 ?Session Start time: 1300 ?Session End time: 9798 ?Total time in minutes:  ? ?Referring Provider: Dr. Lajuan Lines, MD ?Patient/Family location: Pt is in her car @ work ?Yesenia Wheeler Provider location: Yesenia Wheeler Office ?All persons participating in visit: Pt & Clinician ?Types of Service: Individual psychotherapy ? ?I connected with Yesenia Wheeler and/or Yesenia Wheeler  self  via  Telephone or Geologist, engineering  (Video is Caregility application) and verified that I am speaking with the correct person using two identifiers. Discussed confidentiality: Yes  ? ?I discussed the limitations of telemedicine and the availability of in person appointments.  Discussed there is a possibility of technology failure and discussed alternative modes of communication if that failure occurs. ? ?I discussed that engaging in this telemedicine visit, they consent to the provision of behavioral healthcare and the Wheeler will be billed under their insurance. ? ?Patient and/or legal guardian expressed understanding and consented to Telemedicine visit: Yes  ? ?Presenting Concerns: ?Patient and/or family reports the following symptoms/concerns: elevated anx/dep & anger due to recent separation from Son's Father  ?Duration of problem: months; Severity of problem: moderate ? ?Patient and/or Family's Strengths/Protective Factors: ?Social and Emotional competence, Concrete supports in place (healthy food, safe environments, etc.), and Sense of purpose ? ?Goals Addressed: ?Patient will: ? Reduce symptoms of: anxiety, depression, and stress  ? Increase knowledge and/or ability of: coping skills, healthy habits, and stress reduction  ? Demonstrate ability to: Increase healthy adjustment to current life circumstances ? ?Progress towards  Goals: ?Ongoing ? ?Interventions: ?Interventions utilized:  Supportive Counseling and Marital Cslg ?Standardized Assessments completed:  screeners prn ? ?Provided psychoedu re: relational issues related to Yesenia Wheeler & how coping tools can assist her greatly. ? ?Patient and/or Family Response: Pt is receptive to call today & "in need of discussion regarding my Ex-Partner Yesenia Wheeler".  ? ?Assessment: ?Patient currently experiencing elevated anx/dep & anger due to the recent separation of herself & Partner. Partner has Hx of an Affair & now has Newborn Baby Girl since 2-3 days ago w/his new Production manager. He has a Hx of 5 children w/different Mothers.  ? ?Pt related her recent issues w/anger; wanting to commit SI or HI. Pt does not express SI?SA nor HI/HA today. ? ?Patient may benefit from cont'd encouragement, support for boundary-setting w/Ex-Partner. ? ?Plan: ?Follow up with behavioral health clinician on : 2-3 wks on telehealth for 60 min ?Behavioral recommendations: Write down your thoughts & feelings in your Journal btwn sessions so we can process what is bothering you.  ?Referral(s): [place] (In Clinic) ? ?I discussed the assessment and treatment plan with the patient and/or parent/guardian. They were provided an opportunity to ask questions and all were answered. They agreed with the plan and demonstrated an understanding of the instructions. ?  ?They were advised to call back or seek an in-person evaluation if the symptoms worsen or if the condition fails to improve as anticipated. ? ?Donnetta Hutching, LMFT ?

## 2021-12-20 ENCOUNTER — Telehealth: Payer: Self-pay | Admitting: *Deleted

## 2021-12-20 NOTE — Telephone Encounter (Signed)
Return pt's call - stated she's having surgery May 26th and she needs surgical clearance. Informed pt their office needs to fax [place] the form (fax # given to pt) and she needs an appt. ?Appt scheduled w/Dr Jeanice Lim on Monday 5/15@ 1415 PM. ?

## 2021-12-26 ENCOUNTER — Ambulatory Visit (HOSPITAL_COMMUNITY)
Admission: RE | Admit: 2021-12-26 | Discharge: 2021-12-26 | Disposition: A | Discharge status: Home / Self Care | Payer: PRIVATE HEALTH INSURANCE | Source: Ambulatory Visit | Attending: Internal Medicine | Admitting: Internal Medicine

## 2021-12-26 ENCOUNTER — Ambulatory Visit (INDEPENDENT_AMBULATORY_CARE_PROVIDER_SITE_OTHER): Payer: PRIVATE HEALTH INSURANCE | Admitting: Internal Medicine

## 2021-12-26 VITALS — BP 130/75 | HR 75 | Temp 98.8°F | Wt 236.6 lb

## 2021-12-26 DIAGNOSIS — Z01818 Encounter for other preprocedural examination: Secondary | ICD-10-CM

## 2021-12-26 DIAGNOSIS — Z419 Encounter for procedure for purposes other than remedying health state, unspecified: Secondary | ICD-10-CM

## 2021-12-26 DIAGNOSIS — D509 Iron deficiency anemia, unspecified: Secondary | ICD-10-CM | POA: Diagnosis not present

## 2021-12-26 DIAGNOSIS — E669 Obesity, unspecified: Secondary | ICD-10-CM | POA: Diagnosis not present

## 2021-12-26 DIAGNOSIS — Z6830 Body mass index (BMI) 30.0-30.9, adult: Secondary | ICD-10-CM

## 2021-12-26 NOTE — Patient Instructions (Signed)
Thank you, [name] for allowing [place] to provide your care today. Today we discussed pre-op clearance.   ? ?I have ordered the following labs for you: ? ?Lab Orders    ?     CMP14 + Anion Gap    ?     Protime-INR    ?     APTT    ?     CBC with Diff    ?     Beta HCG, Quant ([facility])    ?     HIV antibody (with reflex)     ? ? ?Should you have any questions or concerns please call the internal medicine clinic at [phone].   ? ?Timothy Lasso, MD ?[place] ?  ?

## 2021-12-26 NOTE — Assessment & Plan Note (Addendum)
Patient is scheduled to have plastic surgery on Jan 06, 2022 she will be undergoing an abdominoplasty extended with 4 areas liposuction.  EKG was obtained in office which revealed normal sinus rhythm.  The following labs were obtained: CMP/PT, PTT, INR/CBC with differential/serum beta hCG/HIV test/chest x-ray.  She is obese with a BMI of 40.  She is low risk for CVD as she does not have hypertension (BP in office 130/75), she does not have diabetes (A1c obtained 12 months ago 5.5%), and no prior history of MI or stroke. ASCVD is 1.4%-2.5%.  She has a history of four DVTs, last 1 was in 2016.  She is currently on Eliquis '5mg'$  twice daily.  And compliant with medication.  She was informed that she will need to stop Eliquis a few days prior to surgery.  Following the surgery, patient will need to restart Eliquis as soon as possible as she is high risk for clots.  Revised cardiac risk index is 0.  Metabolic equivalence is 5.3.  She denies chest pain on exertion, dyspnea on exertion, chest pain or shortness of breath.  She is able to walk for up to 1 hour without difficulty.  She does have a history of bilateral meniscal tears which limits strenuous exercise.  Otherwise, pending lab results, patient is a candidate for surgery. ?

## 2021-12-26 NOTE — Progress Notes (Signed)
? ? ? ?Subjective:  ? ?  ?Patient ID: Yesenia Wheeler, female    DOB: 1971-04-24, 51 y.o.   MRN: [id number] ? ?Chief Complaint  ?Patient presents with  ? Medical Clearance  ?  Medical clearance for surgery (tummy tuck and lipo)   ? ? ?HPI ?Patient is in today for surgical clearance. ? ?Review of Systems  ?Constitutional:  Negative for chills, fever and weight loss.  ?HENT:  Negative for congestion and sore throat.   ?Eyes:  Negative for blurred vision.  ?Respiratory:  Negative for cough and shortness of breath.   ?Cardiovascular:  Negative for chest pain, palpitations and leg swelling.  ?Gastrointestinal:  Negative for abdominal pain, constipation, diarrhea, nausea and vomiting.  ?Genitourinary:  Negative for dysuria and hematuria.  ?Musculoskeletal:  Negative for falls and myalgias.  ?Skin:  Negative for rash.  ?Neurological:  Negative for dizziness, seizures, loss of consciousness, weakness and headaches.  ?Endo/Heme/Allergies:  Does not bruise/bleed easily.  ?Psychiatric/Behavioral:  Negative for depression and substance abuse. The patient is not nervous/anxious.   ? ? ?   ?Objective:  ?  ?BP 130/75 (BP Location: Left Arm, Patient Position: Sitting, Cuff Size: Normal)   Pulse 75   Temp 98.8 ?F (37.1 ?C) (Oral)   Wt 236 lb 9.6 oz (107.3 kg)   SpO2 100%   BMI 40.61 kg/m?  ? ? ?Physical Exam ?Constitutional:   ?   General: She is not in acute distress. ?   Appearance: She is obese.  ?HENT:  ?   Head: Normocephalic and atraumatic.  ?Cardiovascular:  ?   Rate and Rhythm: Normal rate and regular rhythm.  ?   Heart sounds: Normal heart sounds, S1 normal and S2 normal.  ?Pulmonary:  ?   Effort: Pulmonary effort is normal.  ?   Breath sounds: Normal breath sounds and air entry.  ?Abdominal:  ?   General: Bowel sounds are normal.  ?   Palpations: Abdomen is soft.  ?   Tenderness: There is no abdominal tenderness.  ?Musculoskeletal:  ?   Cervical back: Full passive range of motion without pain.  ?   Right lower leg:  No edema.  ?   Left lower leg: No edema.  ?Skin: ?   General: Skin is warm and dry.  ?Neurological:  ?   General: No focal deficit present.  ?   Mental Status: She is alert.  ?Psychiatric:     ?   Behavior: Behavior normal. Behavior is cooperative.  ? ? ? ? ?   ?Assessment & Plan:  ? ?Problem List Items Addressed This Visit   ? ?  ? Other  ? Obesity (BMI 30-39.9)  ? Pre-op evaluation  ?  Patient patient is scheduled to have plastic surgery on Jan 06, 2022 she will be undergoing an abdominoplasty extended with 4 areas liposuction.  EKG was obtained in office which revealed normal sinus rhythm.  The following labs were obtained: CMP/PT, PTT, INR/CBC with differential/serum beta hCG/HIV test/chest x-ray.  She is obese with a BMI of 40.  She is low risk for CVD as she does not have hypertension (BP in office 130/75), she does not have diabetes (A1c obtained 12 months ago 5.5%), and no prior history of MI or stroke. ASCVD is 1.4%-2.5%.  She has a history of for DVTs, last 1 was in 2016.  She is currently on Eliquis twice daily.  And compliant with medication.  She was informed that she will need to stop Eliquis a  few days prior to surgery.  Following the surgery, patient will need to restart Eliquis as soon as possible as she is high risk for clots.  Revised cardiac risk index is 0.  Metabolic equivalence is 5.3.  She denies chest pain on exertion, dyspnea on exertion, chest pain or shortness of breath.  She is able to walk for up to 1 hour without difficulty.  She does have a history of bilateral meniscal tears which limits strenuous exercise.  Otherwise, pending lab results, patient is a candidate for surgery. ? ?  ?  ? Relevant Orders  ? CMP14 + Anion Gap  ? Protime-INR  ? APTT  ? CBC with Diff  ? Beta HCG, Quant ([facility])  ? HIV antibody (with reflex)  ? ?Other Visit Diagnoses   ? ? Surgery, elective    -  Primary  ? Relevant Orders  ? EKG 12-Lead (Completed)  ? DG Chest 2 View  ? ?  ? ? ? ? ? ?Timothy Lasso,  MD ? ? ?

## 2021-12-27 ENCOUNTER — Telehealth: Payer: Self-pay | Admitting: Internal Medicine

## 2021-12-27 ENCOUNTER — Other Ambulatory Visit: Payer: PRIVATE HEALTH INSURANCE

## 2021-12-27 ENCOUNTER — Ambulatory Visit (HOSPITAL_COMMUNITY)
Admission: RE | Admit: 2021-12-27 | Discharge: 2021-12-27 | Disposition: A | Discharge status: Home / Self Care | Payer: PRIVATE HEALTH INSURANCE | Source: Ambulatory Visit | Attending: Internal Medicine | Admitting: Internal Medicine

## 2021-12-27 LAB — CMP14 + ANION GAP
ALT: 10 IU/L (ref 0–32)
AST: 19 IU/L (ref 0–40)
Albumin/Globulin Ratio: 1.3 (ref 1.2–2.2)
Albumin: 3.8 g/dL (ref 3.8–4.8)
Alkaline Phosphatase: 130 IU/L — ABNORMAL HIGH (ref 44–121)
Anion Gap: 15 mmol/L (ref 10.0–18.0)
BUN/Creatinine Ratio: 12 (ref 9–23)
BUN: 11 mg/dL (ref 6–24)
Bilirubin Total: 0.4 mg/dL (ref 0.0–1.2)
CO2: 18 mmol/L — ABNORMAL LOW (ref 20–29)
Calcium: 8.6 mg/dL — ABNORMAL LOW (ref 8.7–10.2)
Chloride: 107 mmol/L — ABNORMAL HIGH (ref 96–106)
Creatinine, Ser: 0.92 mg/dL (ref 0.57–1.00)
Globulin, Total: 2.9 g/dL (ref 1.5–4.5)
Glucose: 74 mg/dL (ref 70–99)
Potassium: 4.5 mmol/L (ref 3.5–5.2)
Sodium: 140 mmol/L (ref 134–144)
Total Protein: 6.7 g/dL (ref 6.0–8.5)
eGFR: 76 mL/min/{1.73_m2} (ref >59)

## 2021-12-27 LAB — CBC WITH DIFFERENTIAL/PLATELET
Basophils Absolute: 0 10*3/uL (ref 0.0–0.2)
Basos: 1 %
EOS (ABSOLUTE): 0.1 10*3/uL (ref 0.0–0.4)
Eos: 2 %
Hematocrit: 31 % — ABNORMAL LOW (ref 34.0–46.6)
Hemoglobin: 9.5 g/dL — ABNORMAL LOW (ref 11.1–15.9)
Immature Grans (Abs): 0 10*3/uL (ref 0.0–0.1)
Immature Granulocytes: 0 %
Lymphocytes Absolute: 2.2 10*3/uL (ref 0.7–3.1)
Lymphs: 36 %
MCH: 23.7 pg — ABNORMAL LOW (ref 26.6–33.0)
MCHC: 30.6 g/dL — ABNORMAL LOW (ref 31.5–35.7)
MCV: 77 fL — ABNORMAL LOW (ref 79–97)
Monocytes Absolute: 0.5 10*3/uL (ref 0.1–0.9)
Monocytes: 7 %
Neutrophils Absolute: 3.3 10*3/uL (ref 1.4–7.0)
Neutrophils: 54 %
Platelets: 313 10*3/uL (ref 150–450)
RBC: 4.01 x10E6/uL (ref 3.77–5.28)
RDW: 15.6 % — ABNORMAL HIGH (ref 11.7–15.4)
WBC: 6.1 10*3/uL (ref 3.4–10.8)

## 2021-12-27 LAB — BETA HCG QUANT (REF LAB): hCG Quant: <1 m[IU]/mL

## 2021-12-27 LAB — HIV ANTIBODY (ROUTINE TESTING W REFLEX): HIV Screen 4th Generation wRfx: NONREACTIVE

## 2021-12-27 NOTE — Addendum Note (Signed)
Addended by: Timothy Lasso I on: 12/27/2021 08:47 AM ? ? Modules accepted: Orders ? ?

## 2021-12-29 ENCOUNTER — Telehealth: Payer: Self-pay | Admitting: *Deleted

## 2021-12-29 ENCOUNTER — Telehealth: Payer: Self-pay | Admitting: Internal Medicine

## 2021-12-29 LAB — IRON,TIBC AND FERRITIN PANEL
Ferritin: 9 ng/mL — ABNORMAL LOW (ref 15–150)
Iron Saturation: 6 % — CL (ref 15–55)
Iron: 26 ug/dL — ABNORMAL LOW (ref 27–159)
Total Iron Binding Capacity: 446 ug/dL (ref 250–450)
UIBC: 420 ug/dL (ref 131–425)

## 2021-12-29 LAB — SPECIMEN STATUS REPORT

## 2021-12-29 MED ORDER — FERROUS SULFATE 325 (65 FE) MG PO TBEC: 325.0000 mg | DELAYED_RELEASE_TABLET | Freq: Every day | ORAL | 0 refills | Status: DC

## 2021-12-29 NOTE — Addendum Note (Signed)
Addended by: Timothy Lasso I on: 12/29/2021 08:42 AM   Modules accepted: Orders

## 2021-12-29 NOTE — Telephone Encounter (Signed)
I called patient to inform her of her lab results. CBC and iron studies significant for iron deficiency anemia. Iron supplements were sent to her pharmacy and she was instructed to pick it up at her earliest convenience. She acknowledges and agrees. Denies any symptoms at this time.

## 2021-12-29 NOTE — Telephone Encounter (Signed)
Call from pt who stated she received a call from the doctor today who told her her H/H is low and rx for iron pills has been sent to the pharmacy. She wants to know is there anything else she can do to increase her H/H b/c she's having surgery in [place] next Friday (which is already paid for).  Also she needs the paperwork completed and faxed to their office. Thanks

## 2021-12-30 ENCOUNTER — Other Ambulatory Visit (INDEPENDENT_AMBULATORY_CARE_PROVIDER_SITE_OTHER): Payer: PRIVATE HEALTH INSURANCE

## 2021-12-30 ENCOUNTER — Other Ambulatory Visit: Payer: Self-pay | Admitting: Internal Medicine

## 2021-12-30 DIAGNOSIS — Z01818 Encounter for other preprocedural examination: Secondary | ICD-10-CM

## 2021-12-30 LAB — PROTIME-INR
INR: 1 (ref 0.8–1.2)
Prothrombin Time: 13.1 seconds (ref 11.4–15.2)

## 2021-12-30 LAB — APTT: aPTT: 30 seconds (ref 24–36)

## 2021-12-30 NOTE — Addendum Note (Signed)
Addended by: Truddie Crumble on: 12/30/2021 12:11 PM   Modules accepted: Orders

## 2021-12-31 LAB — NOVEL CORONAVIRUS, NAA: SARS-CoV-2, NAA: NOT DETECTED

## 2022-01-06 NOTE — Progress Notes (Signed)
Internal Medicine Clinic Attending ? ?Case discussed with Dr. Ariwodo  At the time of the visit.  We reviewed the resident?s history and exam and pertinent patient test results.  I agree with the assessment, diagnosis, and plan of care documented in the resident?s note.  ?

## 2022-01-17 ENCOUNTER — Telehealth: Payer: Self-pay | Admitting: Behavioral Health

## 2022-01-17 ENCOUNTER — Institutional Professional Consult (permissible substitution): Payer: PRIVATE HEALTH INSURANCE | Admitting: Behavioral Health

## 2022-01-17 NOTE — Telephone Encounter (Signed)
Unable to lv msg for Pt today bc Mailbox is full.   Dr. Theodis Shove

## 2022-02-13 ENCOUNTER — Encounter (HOSPITAL_BASED_OUTPATIENT_CLINIC_OR_DEPARTMENT_OTHER): Payer: Self-pay

## 2022-02-13 ENCOUNTER — Other Ambulatory Visit: Payer: Self-pay

## 2022-02-13 ENCOUNTER — Emergency Department (HOSPITAL_BASED_OUTPATIENT_CLINIC_OR_DEPARTMENT_OTHER)
Admission: EM | Admit: 2022-02-13 | Discharge: 2022-02-13 | Disposition: A | Discharge status: Home / Self Care | Payer: PRIVATE HEALTH INSURANCE | Attending: Emergency Medicine | Admitting: Emergency Medicine

## 2022-02-13 DIAGNOSIS — Z7901 Long term (current) use of anticoagulants: Secondary | ICD-10-CM | POA: Insufficient documentation

## 2022-02-13 DIAGNOSIS — N898 Other specified noninflammatory disorders of vagina: Secondary | ICD-10-CM | POA: Insufficient documentation

## 2022-02-13 DIAGNOSIS — L233 Allergic contact dermatitis due to drugs in contact with skin: Secondary | ICD-10-CM | POA: Diagnosis not present

## 2022-02-13 LAB — WET PREP, GENITAL
Clue Cells Wet Prep HPF POC: NONE SEEN
Sperm: NONE SEEN
Trich, Wet Prep: NONE SEEN
WBC, Wet Prep HPF POC: <10 (ref <10)
Yeast Wet Prep HPF POC: NONE SEEN

## 2022-02-13 MED ORDER — FLUCONAZOLE 150 MG PO TABS
150.0000 mg | ORAL_TABLET | Freq: Once | ORAL | Status: AC
  Administered 2022-02-13: 150 mg via ORAL
  Filled 2022-02-13: qty 1

## 2022-02-13 NOTE — ED Triage Notes (Signed)
Pt states she has a "bad yeast infection" x 4 days +itching and discharge

## 2022-02-13 NOTE — Discharge Instructions (Signed)
I think you are having a contact reaction to the ointment that you are applying to this area.  Please stop.  I have given you a dose here of medication to treat you for possible yeast infection though there was no obvious signs of yeast infection on your wet prep.  Please follow-up with OB/GYN and your family doctor in the office.

## 2022-02-13 NOTE — ED Provider Notes (Signed)
[facility] EMERGENCY DEPT Provider Note   CSN: [id number] Arrival date & time: 02/13/22  0206     History  Chief Complaint  Patient presents with   Vaginal Discharge    Yesenia Wheeler is a 51 y.o. female.  51 yo F with a chief complaints of vaginal itching discharge and swelling.  This been going on for about 4 days.  She says she got a new product from [facility] and tried it and then developed this reaction.  She is worried that she has yeast infection.  She tried some over-the-counter treatments but without improvement.  No fevers or chills no nausea or vomiting.   Vaginal Discharge      Home Medications Prior to Admission medications   Medication Sig Start Date End Date Taking? Authorizing Provider  albuterol (VENTOLIN HFA) 108 (90 Base) MCG/ACT inhaler Inhale into the lungs every 6 (six) hours as needed for wheezing or shortness of breath.    [provider]  DULoxetine (CYMBALTA) 30 MG capsule Take 1 capsule (30 mg total) by mouth daily. 10/28/21 04/26/22  Delene Ruffini, MD  ELIQUIS 5 MG TABS tablet TAKE 1 TABLET BY MOUTH TWICE A DAY 11/24/21   Rehman, Areeg N, DO  ferrous sulfate 325 (65 FE) MG EC tablet Take 1 tablet (325 mg total) by mouth daily with breakfast. 12/29/21 03/29/22  Timothy Lasso, MD  gabapentin (NEURONTIN) 300 MG capsule 1 capsule in the morning and midday, 2 at night 03/15/21   Kathrynn Ducking, MD  meclizine (ANTIVERT) 25 MG tablet Take 1 tablet (25 mg total) by mouth 3 (three) times daily as needed for dizziness. 05/19/21   Drenda Freeze, MD  enoxaparin (LOVENOX) 40 MG/0.4ML injection Inject 0.4 mLs (40 mg total) into the skin daily. Start 3.14.19 Patient not taking: Reported on 04/30/2018 10/24/17 03/16/20  Irene Limbo, MD      Allergies    Hydrocodone    Review of Systems   Review of Systems  Genitourinary:  Positive for vaginal discharge.    Physical Exam Updated Vital Signs BP 130/82 (BP Location:  Right Arm)   Pulse (!) 114   Temp 98.1 F (36.7 C) (Oral)   Resp 18   Ht '5\' 4"'$  (1.626 m)   Wt 104.3 kg   LMP 01/09/2022 (Exact Date)   SpO2 99%   BMI 39.48 kg/m  Physical Exam Vitals and nursing note reviewed.  Constitutional:      General: She is not in acute distress.    Appearance: She is well-developed. She is not diaphoretic.  HENT:     Head: Normocephalic and atraumatic.  Eyes:     Pupils: Pupils are equal, round, and reactive to light.  Cardiovascular:     Rate and Rhythm: Normal rate and regular rhythm.     Heart sounds: No murmur heard.    No friction rub. No gallop.  Pulmonary:     Effort: Pulmonary effort is normal.     Breath sounds: No wheezing or rales.  Abdominal:     General: There is no distension.     Palpations: Abdomen is soft.     Tenderness: There is no abdominal tenderness.  Genitourinary:    Comments: Erythema and edema to the labia and the area surrounding.  Purulent appearing discharge in the vault.  No obvious cervical motion tenderness no adnexal tenderness or masses Musculoskeletal:        General: No tenderness.     Cervical back: Normal range of motion  and neck supple.  Skin:    General: Skin is warm and dry.  Neurological:     Mental Status: She is alert and oriented to person, place, and time.  Psychiatric:        Behavior: Behavior normal.     ED Results / Procedures / Treatments   Labs (all labs ordered are listed, but only abnormal results are displayed) Labs Reviewed  WET PREP, GENITAL  GC/CHLAMYDIA PROBE AMP ([place]) NOT AT [facility]    EKG None  Radiology No results found.  Procedures Procedures    Medications Ordered in ED Medications  fluconazole (DIFLUCAN) tablet 150 mg (has no administration in time range)    ED Course/ Medical Decision Making/ A&P                           Medical Decision Making Amount and/or Complexity of Data Reviewed Labs: ordered.  Risk Prescription drug management.   51 yo  F with a chief complaints of vaginal discharge.  This been going on for about 4 days.  Exam of the GU area externally most consistent with contact dermatitis.  May be an intolerance to Monistat.  She does have some purulent appearing discharge in the vault but has no cervical motion tenderness.  We will hold off on STD treatments, await wet prep. Wet prep was completely clean.  With patient having concern for possible yeast we will give a dose of Diflucan here.  We will have her follow-up with OB and her PCP.  3:04 AM:  I have discussed the diagnosis/risks/treatment options with the patient.  Evaluation and diagnostic testing in the emergency department does not suggest an emergent condition requiring admission or immediate intervention beyond what has been performed at this time.  They will follow up with  PCP. We also discussed returning to the ED immediately if new or worsening sx occur. We discussed the sx which are most concerning (e.g., sudden worsening pain, fever, inability to tolerate by mouth) that necessitate immediate return. Medications administered to the patient during their visit and any new prescriptions provided to the patient are listed below.  Medications given during this visit Medications  fluconazole (DIFLUCAN) tablet 150 mg (has no administration in time range)     The patient appears reasonably screen and/or stabilized for discharge and I doubt any other medical condition or other [facility] requiring further screening, evaluation, or treatment in the ED at this time prior to discharge.          Final Clinical Impression(s) / ED Diagnoses Final diagnoses:  Allergic contact dermatitis due to drugs in contact with skin  Vaginal discharge    Rx / DC Orders ED Discharge Orders     None         Deno Etienne, DO 02/13/22 0304

## 2022-02-15 ENCOUNTER — Encounter (HOSPITAL_COMMUNITY): Payer: Self-pay | Admitting: Emergency Medicine

## 2022-02-15 ENCOUNTER — Ambulatory Visit (HOSPITAL_COMMUNITY)
Admission: EM | Admit: 2022-02-15 | Discharge: 2022-02-15 | Disposition: A | Discharge status: Home / Self Care | Payer: PRIVATE HEALTH INSURANCE | Attending: Family Medicine | Admitting: Family Medicine

## 2022-02-15 DIAGNOSIS — B379 Candidiasis, unspecified: Secondary | ICD-10-CM | POA: Insufficient documentation

## 2022-02-15 DIAGNOSIS — N760 Acute vaginitis: Secondary | ICD-10-CM | POA: Diagnosis present

## 2022-02-15 LAB — GC/CHLAMYDIA PROBE AMP (~~LOC~~) NOT AT ARMC
Chlamydia: NEGATIVE
Comment: NEGATIVE
Comment: NORMAL
Neisseria Gonorrhea: NEGATIVE

## 2022-02-15 MED ORDER — FLUCONAZOLE 100 MG PO TABS: ORAL_TABLET | ORAL | 0 refills | Status: AC

## 2022-02-15 NOTE — ED Triage Notes (Signed)
Pt is present today with vaginal discharge, vaginal swelling, and vaginal irritation. Pt sx started one week ago

## 2022-02-15 NOTE — Discharge Instructions (Addendum)
Staff will call you if anything is positive on the swab  Take fluconazole 100 mg--2 tablets orally the first day, then 1 tablet daily for 6 more days.

## 2022-02-15 NOTE — ED Provider Notes (Signed)
[facility]    CSN: [id number] Arrival date & time: 02/15/22  1002      History   Chief Complaint Chief Complaint  Patient presents with   Vaginal Itching   Vaginal Discharge    HPI Yesenia Wheeler is a 51 y.o. female.    Vaginal Itching  Vaginal Discharge Associated symptoms: vaginal itching    Here for a 1 week history of vaginal discharge, perineal irritation and redness.  She states that about 10 to 14 days ago she used a new product in her vaginal and perineal area, and she feels that if she is reacting to it she is got some discharge, and she feels that the irritation is spreading onto her thighs.  No fever.  No chronic conditions according to her history.  Her period started 2 days ago, and she notes some odor.  No dysuria or urinary frequency.  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 , complex sclerosing lesion   Migraines    Mild asthma    OA (osteoarthritis)    bilateral knees   Parsonage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    SBO (small bowel obstruction) ([place]) 09/2020    Patient Active Problem List   Diagnosis Date Noted   Pre-op evaluation 12/26/2021   Knee pain 10/29/2021   Iron deficiency anemia 01/13/2021   Thyroid nodule 01/13/2021   Depression 01/13/2021   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   DVT, lower extremity, 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   Left breast mass 10/23/2017   Acute medial meniscus tear of left knee 08/30/2017   Obesity (BMI 30-39.9) 05/26/2014   Asthma, chronic 05/25/2014   Migraine 05/25/2014   Chronic back pain 05/25/2014    Past Surgical History:  Procedure Laterality Date   BREAST LUMPECTOMY WITH RADIOACTIVE SEED LOCALIZATION Left 10/23/2017   Procedure: BREAST LUMPECTOMY  WITH RADIOACTIVE SEED LOCALIZATION;  Surgeon: Erroll Luna, MD;  Location: [facility];  Service: General;  Laterality: Left;   BREAST REDUCTION SURGERY Bilateral 10/23/2017   Procedure: LEFT BREAST ONCOPLASTIC REDUCTION, RIGHT BREAST REDUCTION;  Surgeon: Irene Limbo, MD;  Location: [facility];  Service: Plastics;  Laterality: Bilateral;   CESAREAN SECTION  2011   GASTRIC BYPASS  2001   KNEE ARTHROSCOPY W/ MENISCECTOMY Right 2007   KNEE ARTHROSCOPY WITH MEDIAL MENISECTOMY Left 08/30/2017   Procedure: KNEE ARTHROSCOPY WITH PARTIAL MEDIAL MENISECTOMY;  Surgeon: Nicholes Stairs, MD;  Location: [facility];  Service: Orthopedics;  Laterality: Left;  60 mins   LAPAROSCOPIC CHOLECYSTECTOMY  2001   SHOULDER ARTHROSCOPY Left 2008   spur    OB History     Gravida  36   Para      Term      Preterm      AB  14   Living  3      SAB  13   IAB      Ectopic      Multiple      Live Births               Home Medications    Prior to Admission medications   Medication Sig Start Date End Date Taking? Authorizing Provider  fluconazole (DIFLUCAN) 100 MG tablet Take 2 tablets orally the first day, then take 1 daily for 6 more days. 02/15/22 02/23/22 Yes Chala Gul, Gwenlyn Perking, MD  albuterol (VENTOLIN HFA) 108 (90 Base) MCG/ACT inhaler Inhale into the lungs every 6 (six) hours as needed for wheezing or shortness of breath.    [provider]  DULoxetine (CYMBALTA) 30 MG capsule Take 1 capsule (30 mg total) by mouth daily. 10/28/21 04/26/22  Delene Ruffini, MD  ELIQUIS 5 MG TABS tablet TAKE 1 TABLET BY MOUTH TWICE A DAY 11/24/21   Rehman, Areeg N, DO  ferrous sulfate 325 (65 FE) MG EC tablet Take 1 tablet (325 mg total) by mouth daily with breakfast. 12/29/21 03/29/22  Timothy Lasso, MD  gabapentin (NEURONTIN) 300 MG capsule 1 capsule in the morning and midday, 2 at night 03/15/21   Kathrynn Ducking, MD  meclizine (ANTIVERT) 25  MG tablet Take 1 tablet (25 mg total) by mouth 3 (three) times daily as needed for dizziness. 05/19/21   Drenda Freeze, MD  enoxaparin (LOVENOX) 40 MG/0.4ML injection Inject 0.4 mLs (40 mg total) into the skin daily. Start 3.14.19 Patient not taking: Reported on 04/30/2018 10/24/17 03/16/20  Irene Limbo, MD    Family History Family History  Problem Relation Age of Onset   Cancer Father        oral    Social History Social History   Tobacco Use   Smoking status: Former    Years: 6.00    Types: Cigarettes    Quit date: 06/23/2017    Years since quitting: 4.6   Smokeless tobacco: Never   Tobacco comments:    2ppwk  Vaping Use   Vaping Use: Former  Substance Use Topics   Alcohol use: Yes    Comment: occasional   Drug use: No     Allergies   Hydrocodone   Review of Systems Review of Systems  Genitourinary:  Positive for vaginal discharge.     Physical Exam Triage Vital Signs ED Triage Vitals [02/15/22 1043]  Enc Vitals Group     BP 124/75     Pulse Rate (!) 106     Resp 18     Temp 98.1 F (36.7 C)     Temp src      SpO2 100 %     Weight      Height      Head Circumference      Peak Flow      Pain Score 10     Pain Loc      Pain Edu?      Excl. in [place]?    No data found.  Updated Vital Signs BP 124/75   Pulse (!) 106   Temp 98.1 F (36.7 C)   Resp 18   LMP 02/13/2022 (Exact Date)   SpO2 100%   Visual Acuity Right Eye Distance:   Left Eye Distance:   Bilateral Distance:    Right Eye Near:   Left Eye Near:    Bilateral Near:     Physical Exam Vitals reviewed.  Constitutional:      General: She is not in acute distress.    Appearance: She is not ill-appearing, toxic-appearing or diaphoretic.  Genitourinary:    Comments: There is some faint erythema of her medial proximal thighs and onto her perineum. Neurological:     Mental Status: She is alert and oriented to person, place, and time.  Psychiatric:  Behavior: Behavior  normal.      UC Treatments / Results  Labs (all labs ordered are listed, but only abnormal results are displayed) Labs Reviewed  CERVICOVAGINAL ANCILLARY ONLY    EKG   Radiology No results found.  Procedures Procedures (including critical care time)  Medications Ordered in UC Medications - No data to display  Initial Impression / Assessment and Plan / UC Course  I have reviewed the triage vital signs and the nursing notes.  Pertinent labs & imaging results that were available during my care of the patient were reviewed by me and considered in my medical decision making (see chart for details).     Swab done and staff will notify her of any positives and treat per protocol.  I am sending in a weeks worth of fluconazole for possible candidiasis of the perineum and upper thighs Final Clinical Impressions(s) / UC Diagnoses   Final diagnoses:  Acute vaginitis  Candidiasis     Discharge Instructions      Staff will call you if anything is positive on the swab  Take fluconazole 100 mg--2 tablets orally the first day, then 1 tablet daily for 6 more days.       ED Prescriptions     Medication Sig Dispense Auth. Provider   fluconazole (DIFLUCAN) 100 MG tablet Take 2 tablets orally the first day, then take 1 daily for 6 more days. 8 tablet Cinque Begley, Gwenlyn Perking, MD      PDMP not reviewed this encounter.   Barrett Henle, MD 02/15/22 1135

## 2022-02-16 LAB — CERVICOVAGINAL ANCILLARY ONLY
Bacterial Vaginitis (gardnerella): POSITIVE — AB
Candida Glabrata: NEGATIVE
Candida Vaginitis: NEGATIVE
Chlamydia: NEGATIVE
Comment: NEGATIVE
Comment: NEGATIVE
Comment: NEGATIVE
Comment: NEGATIVE
Comment: NEGATIVE
Comment: NORMAL
Neisseria Gonorrhea: NEGATIVE
Trichomonas: NEGATIVE

## 2022-02-17 ENCOUNTER — Telehealth (HOSPITAL_COMMUNITY): Payer: Self-pay | Admitting: Emergency Medicine

## 2022-02-17 MED ORDER — METRONIDAZOLE 500 MG PO TABS: 500.0000 mg | ORAL_TABLET | Freq: Two times a day (BID) | ORAL | 0 refills | Status: DC

## 2022-03-20 ENCOUNTER — Encounter (HOSPITAL_BASED_OUTPATIENT_CLINIC_OR_DEPARTMENT_OTHER): Payer: Self-pay | Admitting: Obstetrics and Gynecology

## 2022-03-20 ENCOUNTER — Ambulatory Visit (HOSPITAL_COMMUNITY)
Admission: EM | Admit: 2022-03-20 | Discharge: 2022-03-20 | Disposition: A | Discharge status: Home / Self Care | Payer: PRIVATE HEALTH INSURANCE | Attending: Physician Assistant | Admitting: Physician Assistant

## 2022-03-20 ENCOUNTER — Encounter (HOSPITAL_COMMUNITY): Payer: Self-pay

## 2022-03-20 ENCOUNTER — Emergency Department (HOSPITAL_BASED_OUTPATIENT_CLINIC_OR_DEPARTMENT_OTHER): Payer: PRIVATE HEALTH INSURANCE

## 2022-03-20 ENCOUNTER — Emergency Department (HOSPITAL_BASED_OUTPATIENT_CLINIC_OR_DEPARTMENT_OTHER)
Admission: EM | Admit: 2022-03-20 | Discharge: 2022-03-20 | Disposition: A | Discharge status: Home / Self Care | Payer: PRIVATE HEALTH INSURANCE | Attending: Emergency Medicine | Admitting: Emergency Medicine

## 2022-03-20 DIAGNOSIS — R1013 Epigastric pain: Secondary | ICD-10-CM | POA: Insufficient documentation

## 2022-03-20 DIAGNOSIS — I2694 Multiple subsegmental pulmonary emboli without acute cor pulmonale: Secondary | ICD-10-CM | POA: Insufficient documentation

## 2022-03-20 DIAGNOSIS — R110 Nausea: Secondary | ICD-10-CM

## 2022-03-20 DIAGNOSIS — Z7901 Long term (current) use of anticoagulants: Secondary | ICD-10-CM | POA: Diagnosis not present

## 2022-03-20 DIAGNOSIS — R1010 Upper abdominal pain, unspecified: Secondary | ICD-10-CM

## 2022-03-20 LAB — URINALYSIS, ROUTINE W REFLEX MICROSCOPIC
Bilirubin Urine: NEGATIVE
Glucose, UA: NEGATIVE mg/dL
Hgb urine dipstick: NEGATIVE
Ketones, ur: NEGATIVE mg/dL
Nitrite: NEGATIVE
Specific Gravity, Urine: 1.028 (ref 1.005–1.030)
WBC, UA: >50 WBC/hpf — ABNORMAL HIGH (ref 0–5)
pH: 6 (ref 5.0–8.0)

## 2022-03-20 LAB — CBC
HCT: 32.7 % — ABNORMAL LOW (ref 36.0–46.0)
Hemoglobin: 10.4 g/dL — ABNORMAL LOW (ref 12.0–15.0)
MCH: 24.8 pg — ABNORMAL LOW (ref 26.0–34.0)
MCHC: 31.8 g/dL (ref 30.0–36.0)
MCV: 77.9 fL — ABNORMAL LOW (ref 80.0–100.0)
Platelets: 314 10*3/uL (ref 150–400)
RBC: 4.2 MIL/uL (ref 3.87–5.11)
RDW: 17.2 % — ABNORMAL HIGH (ref 11.5–15.5)
WBC: 5.4 10*3/uL (ref 4.0–10.5)
nRBC: 0 % (ref 0.0–0.2)

## 2022-03-20 LAB — COMPREHENSIVE METABOLIC PANEL
ALT: 11 U/L (ref 0–44)
AST: 18 U/L (ref 15–41)
Albumin: 3.8 g/dL (ref 3.5–5.0)
Alkaline Phosphatase: 100 U/L (ref 38–126)
Anion gap: 6 (ref 5–15)
BUN: 10 mg/dL (ref 6–20)
CO2: 26 mmol/L (ref 22–32)
Calcium: 8.6 mg/dL — ABNORMAL LOW (ref 8.9–10.3)
Chloride: 106 mmol/L (ref 98–111)
Creatinine, Ser: 0.95 mg/dL (ref 0.44–1.00)
GFR, Estimated: >60 mL/min (ref >60)
Glucose, Bld: 89 mg/dL (ref 70–99)
Potassium: 3.5 mmol/L (ref 3.5–5.1)
Sodium: 138 mmol/L (ref 135–145)
Total Bilirubin: 0.5 mg/dL (ref 0.3–1.2)
Total Protein: 7.2 g/dL (ref 6.5–8.1)

## 2022-03-20 LAB — LIPASE, BLOOD: Lipase: 29 U/L (ref 11–51)

## 2022-03-20 LAB — POC URINE PREG, ED: Preg Test, Ur: NEGATIVE

## 2022-03-20 LAB — POCT URINALYSIS DIPSTICK, ED / UC
Bilirubin Urine: NEGATIVE
Glucose, UA: NEGATIVE mg/dL
Hgb urine dipstick: NEGATIVE
Ketones, ur: NEGATIVE mg/dL
Nitrite: NEGATIVE
Protein, ur: NEGATIVE mg/dL
Specific Gravity, Urine: 1.015 (ref 1.005–1.030)
Urobilinogen, UA: 0.2 mg/dL (ref 0.0–1.0)
pH: 5.5 (ref 5.0–8.0)

## 2022-03-20 LAB — LACTIC ACID, PLASMA: Lactic Acid, Venous: 1.4 mmol/L (ref 0.5–1.9)

## 2022-03-20 LAB — TROPONIN I (HIGH SENSITIVITY): Troponin I (High Sensitivity): 5 ng/L (ref <18)

## 2022-03-20 LAB — PREGNANCY, URINE: Preg Test, Ur: NEGATIVE

## 2022-03-20 MED ORDER — LORAZEPAM 2 MG/ML IJ SOLN
1.0000 mg | Freq: Once | INTRAMUSCULAR | Status: AC
  Administered 2022-03-20: 1 mg via INTRAVENOUS
  Filled 2022-03-20: qty 1

## 2022-03-20 MED ORDER — ALUM & MAG HYDROXIDE-SIMETH 200-200-20 MG/5ML PO SUSP
ORAL | Status: AC
  Filled 2022-03-20: qty 30

## 2022-03-20 MED ORDER — LIDOCAINE VISCOUS HCL 2 % MT SOLN
15.0000 mL | Freq: Once | OROMUCOSAL | Status: AC
  Administered 2022-03-20: 15 mL via ORAL

## 2022-03-20 MED ORDER — IOHEXOL 350 MG/ML SOLN
80.0000 mL | Freq: Once | INTRAVENOUS | Status: AC | PRN
  Administered 2022-03-20: 80 mL via INTRAVENOUS

## 2022-03-20 MED ORDER — PANTOPRAZOLE SODIUM 40 MG IV SOLR
40.0000 mg | Freq: Once | INTRAVENOUS | Status: AC
  Administered 2022-03-20: 40 mg via INTRAVENOUS
  Filled 2022-03-20: qty 10

## 2022-03-20 MED ORDER — APIXABAN (ELIQUIS) VTE STARTER PACK (10MG AND 5MG): ORAL_TABLET | ORAL | 0 refills | Status: DC

## 2022-03-20 MED ORDER — ONDANSETRON 4 MG PO TBDP
4.0000 mg | ORAL_TABLET | Freq: Once | ORAL | Status: AC
  Administered 2022-03-20: 4 mg via ORAL

## 2022-03-20 MED ORDER — SODIUM CHLORIDE 0.9 % IV BOLUS
1000.0000 mL | Freq: Once | INTRAVENOUS | Status: AC
Start: 2022-03-20 — End: 2022-03-20
  Administered 2022-03-20: 1000 mL via INTRAVENOUS

## 2022-03-20 MED ORDER — ALUM & MAG HYDROXIDE-SIMETH 200-200-20 MG/5ML PO SUSP
30.0000 mL | Freq: Once | ORAL | Status: AC
  Administered 2022-03-20: 30 mL via ORAL
  Filled 2022-03-20: qty 30

## 2022-03-20 MED ORDER — ONDANSETRON 4 MG PO TBDP
ORAL_TABLET | ORAL | Status: AC
  Filled 2022-03-20: qty 1

## 2022-03-20 MED ORDER — APIXABAN 2.5 MG PO TABS
10.0000 mg | ORAL_TABLET | Freq: Once | ORAL | Status: AC
Start: 2022-03-20 — End: 2022-03-20
  Administered 2022-03-20: 7.5 mg via ORAL
  Filled 2022-03-20: qty 4

## 2022-03-20 MED ORDER — MORPHINE SULFATE (PF) 4 MG/ML IV SOLN
4.0000 mg | Freq: Once | INTRAVENOUS | Status: AC
  Administered 2022-03-20: 4 mg via INTRAVENOUS
  Filled 2022-03-20: qty 1

## 2022-03-20 MED ORDER — APIXABAN 2.5 MG PO TABS
2.5000 mg | ORAL_TABLET | Freq: Once | ORAL | Status: AC
Start: 2022-03-20 — End: 2022-03-20
  Administered 2022-03-20: 2.5 mg via ORAL
  Filled 2022-03-20: qty 1

## 2022-03-20 MED ORDER — OMEPRAZOLE 20 MG PO CPDR: 20.0000 mg | DELAYED_RELEASE_CAPSULE | Freq: Every day | ORAL | 0 refills | Status: DC

## 2022-03-20 MED ORDER — IOHEXOL 300 MG/ML  SOLN
100.0000 mL | Freq: Once | INTRAMUSCULAR | Status: AC | PRN
  Administered 2022-03-20: 100 mL via INTRAVENOUS

## 2022-03-20 MED ORDER — ONDANSETRON HCL 4 MG/2ML IJ SOLN
4.0000 mg | Freq: Once | INTRAMUSCULAR | Status: AC
  Administered 2022-03-20: 4 mg via INTRAVENOUS
  Filled 2022-03-20: qty 2

## 2022-03-20 MED ORDER — HYDROMORPHONE HCL 1 MG/ML IJ SOLN
1.0000 mg | Freq: Once | INTRAMUSCULAR | Status: AC
  Administered 2022-03-20: 1 mg via INTRAVENOUS
  Filled 2022-03-20: qty 1

## 2022-03-20 MED ORDER — ALUM & MAG HYDROXIDE-SIMETH 200-200-20 MG/5ML PO SUSP
30.0000 mL | Freq: Once | ORAL | Status: AC
  Administered 2022-03-20: 30 mL via ORAL

## 2022-03-20 NOTE — ED Triage Notes (Signed)
Stomach pain x2days . Pt stateas its a burning pain denies vomiting and diarrhea. Pt states she has nausea as well

## 2022-03-20 NOTE — ED Notes (Signed)
Pt sts pain improved to 5/10, given ginger ale

## 2022-03-20 NOTE — ED Triage Notes (Signed)
Patient reports her pain started Saturday in her abdomen. Patient reports nausea without emesis or diarrhea. Patient reports she was at [facility] and was given Zofran and a GI cocktail.

## 2022-03-20 NOTE — ED Notes (Signed)
Patient transported to CT 

## 2022-03-20 NOTE — ED Provider Notes (Signed)
Yesenia Wheeler    CSN: [id number] Arrival date & time: 03/20/22  1152      History   Chief Complaint Chief Complaint  Patient presents with   Abdominal Pain    HPI Yesenia Wheeler is a 51 y.o. female.   Patient resents today with a several day history of upper abdominal pain.  She reports pain is rated 8 on a 0-10 pain scale, described as burning, worse following meals, no relieving factors identified.  Reports symptoms began suddenly after she took a shot of alcohol with her sister on Saturday night.  Since that time any oral intake tends to exacerbate symptoms.  She reports associated nausea but denies any vomiting or hematemesis.  Denies history of peptic ulcer disease, GERD, pancreatitis.  Denies regular alcohol use.  Does not take GLP-1 agonist.  She has not tried any over-the-counter medication for symptom management.  She does report having her gallbladder removed several years ago but denies additional abdominal surgeries.  She did have a blockage last year but states current symptoms are not similar to previous episodes of this condition.    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 , complex sclerosing lesion   Migraines    Mild asthma    OA (osteoarthritis)    bilateral knees   Parsonage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    SBO (small bowel obstruction) ([place]) 09/2020    Patient Active Problem List   Diagnosis Date Noted   Pre-op evaluation 12/26/2021   Knee pain 10/29/2021   Iron deficiency anemia 01/13/2021   Thyroid nodule 01/13/2021   Depression 01/13/2021   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   DVT, lower extremity, 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   Left breast mass 10/23/2017   Acute medial meniscus tear of left knee  08/30/2017   Obesity (BMI 30-39.9) 05/26/2014   Asthma, chronic 05/25/2014   Migraine 05/25/2014   Chronic back pain 05/25/2014    Past Surgical History:  Procedure Laterality Date   BREAST LUMPECTOMY WITH RADIOACTIVE SEED LOCALIZATION Left 10/23/2017   Procedure: BREAST LUMPECTOMY WITH RADIOACTIVE SEED LOCALIZATION;  Surgeon: Erroll Luna, MD;  Location: [facility];  Service: General;  Laterality: Left;   BREAST REDUCTION SURGERY Bilateral 10/23/2017   Procedure: LEFT BREAST ONCOPLASTIC REDUCTION, RIGHT BREAST REDUCTION;  Surgeon: Irene Limbo, MD;  Location: [facility];  Service: Plastics;  Laterality: Bilateral;   CESAREAN SECTION  2011   GASTRIC BYPASS  2001   KNEE ARTHROSCOPY W/ MENISCECTOMY Right 2007   KNEE ARTHROSCOPY WITH MEDIAL MENISECTOMY Left 08/30/2017   Procedure: KNEE ARTHROSCOPY WITH PARTIAL MEDIAL MENISECTOMY;  Surgeon: Nicholes Stairs, MD;  Location[facility];  Service: Orthopedics;  Laterality: Left;  60 mins   LAPAROSCOPIC CHOLECYSTECTOMY  2001   SHOULDER ARTHROSCOPY Left 2008   spur    OB History     Gravida  13   Para      Term      Preterm      AB  13   Living  3      SAB  13   IAB      Ectopic      Multiple  Live Births               Home Medications    Prior to Admission medications   Medication Sig Start Date End Date Taking? Authorizing Provider  albuterol (VENTOLIN HFA) 108 (90 Base) MCG/ACT inhaler Inhale into the lungs every 6 (six) hours as needed for wheezing or shortness of breath.    [provider]  DULoxetine (CYMBALTA) 30 MG capsule Take 1 capsule (30 mg total) by mouth daily. 10/28/21 04/26/22  Delene Ruffini, MD  ELIQUIS 5 MG TABS tablet TAKE 1 TABLET BY MOUTH TWICE A DAY 11/24/21   Rehman, Areeg N, DO  ferrous sulfate 325 (65 FE) MG EC tablet Take 1 tablet (325 mg total) by mouth daily with breakfast. 12/29/21 03/29/22  Timothy Lasso, MD   gabapentin (NEURONTIN) 300 MG capsule 1 capsule in the morning and midday, 2 at night 03/15/21   Kathrynn Ducking, MD  meclizine (ANTIVERT) 25 MG tablet Take 1 tablet (25 mg total) by mouth 3 (three) times daily as needed for dizziness. 05/19/21   Drenda Freeze, MD  metroNIDAZOLE (FLAGYL) 500 MG tablet Take 1 tablet (500 mg total) by mouth 2 (two) times daily. 02/17/22   Lamptey, Myrene Galas, MD  enoxaparin (LOVENOX) 40 MG/0.4ML injection Inject 0.4 mLs (40 mg total) into the skin daily. Start 3.14.19 Patient not taking: Reported on 04/30/2018 10/24/17 03/16/20  Irene Limbo, MD    Family History Family History  Problem Relation Age of Onset   Cancer Father        oral    Social History Social History   Tobacco Use   Smoking status: Former    Years: 6.00    Types: Cigarettes    Quit date: 06/23/2017    Years since quitting: 4.7   Smokeless tobacco: Never   Tobacco comments:    2ppwk  Vaping Use   Vaping Use: Former  Substance Use Topics   Alcohol use: Yes    Comment: occasional   Drug use: No     Allergies   Hydrocodone   Review of Systems Review of Systems  Constitutional:  Positive for activity change. Negative for appetite change, fatigue and fever.  Respiratory:  Negative for cough and shortness of breath.   Cardiovascular:  Negative for chest pain.  Gastrointestinal:  Positive for abdominal pain and nausea. Negative for diarrhea and vomiting.  Musculoskeletal:  Negative for arthralgias and myalgias.  Neurological:  Negative for dizziness, light-headedness and headaches.     Physical Exam Triage Vital Signs ED Triage Vitals  Enc Vitals Group     BP 03/20/22 1213 121/85     Pulse Rate 03/20/22 1213 68     Resp 03/20/22 1213 12     Temp 03/20/22 1213 98.1 F (36.7 C)     Temp Source 03/20/22 1213 Oral     SpO2 03/20/22 1213 100 %     Weight 03/20/22 1209 230 lb (104.3 kg)     Height 03/20/22 1209 '5\' 4"'$  (1.626 m)     Head Circumference --      Peak  Flow --      Pain Score 03/20/22 1209 7     Pain Loc --      Pain Edu? --      Excl. in [place]? --    No data found.  Updated Vital Signs BP 121/85 (BP Location: Right Arm)   Pulse 68   Temp 98.1 F (36.7 C) (Oral)   Resp 12  Ht '5\' 4"'$  (1.626 m)   Wt 230 lb (104.3 kg)   LMP 03/09/2022   SpO2 100%   BMI 39.48 kg/m   Visual Acuity Right Eye Distance:   Left Eye Distance:   Bilateral Distance:    Right Eye Near:   Left Eye Near:    Bilateral Near:     Physical Exam Vitals reviewed.  Constitutional:      General: She is awake. She is not in acute distress.    Appearance: Normal appearance. She is well-developed. She is not ill-appearing.     Comments: Very pleasant female appears stated age in no acute distress sitting comfortably in exam room  HENT:     Head: Normocephalic and atraumatic.     Mouth/Throat:     Mouth: Mucous membranes are moist.     Pharynx: Uvula midline. No oropharyngeal exudate or posterior oropharyngeal erythema.  Cardiovascular:     Rate and Rhythm: Normal rate and regular rhythm.     Heart sounds: Normal heart sounds, S1 normal and S2 normal. No murmur heard. Pulmonary:     Effort: Pulmonary effort is normal.     Breath sounds: Normal breath sounds. No wheezing, rhonchi or rales.     Comments: Clear to auscultation bilaterally Abdominal:     General: Bowel sounds are normal.     Palpations: Abdomen is soft.     Tenderness: There is abdominal tenderness in the right upper quadrant, epigastric area and left upper quadrant. There is no right CVA tenderness, left CVA tenderness, guarding or rebound.     Comments: Tenderness palpation throughout upper abdomen.  Psychiatric:        Behavior: Behavior is cooperative.      UC Treatments / Results  Labs (all labs ordered are listed, but only abnormal results are displayed) Labs Reviewed  POCT URINALYSIS DIPSTICK, ED / UC - Abnormal; Notable for the following components:      Result Value    Leukocytes,Ua TRACE (*)    All other components within normal limits  POC URINE PREG, ED    EKG   Radiology No results found.  Procedures Procedures (including critical care time)  Medications Ordered in UC Medications  alum & mag hydroxide-simeth (MAALOX/MYLANTA) 200-200-20 MG/5ML suspension 30 mL (30 mLs Oral Given 03/20/22 1310)    And  lidocaine (XYLOCAINE) 2 % viscous mouth solution 15 mL (15 mLs Oral Given 03/20/22 1310)  ondansetron (ZOFRAN-ODT) disintegrating tablet 4 mg (4 mg Oral Given 03/20/22 1352)    Initial Impression / Assessment and Plan / UC Course  I have reviewed the triage vital signs and the nursing notes.  Pertinent labs & imaging results that were available during my care of the patient were reviewed by me and considered in my medical decision making (see chart for details).     Patient had significant tenderness in epigastrium.  Tried to order labs including CBC, CMP, lipase to help determine treatment but was unable to obtain blood from patient despite multiple sticks.  She was given Zofran and GI cocktail in clinic with persistent severe pain.  Given severity of pain recommended she go to the ER for further evaluation and management since we do not have abdominal imaging capabilities or stat labs.  Patient was agreeable and will go directly to ER for further evaluation.  Her vital signs were stable at the time of discharge and she is safe for private transport.  Final Clinical Impressions(s) / UC Diagnoses   Final diagnoses:  Upper abdominal pain  Nausea without vomiting     Discharge Instructions      Given the severity of your pain I do think you should go to the emergency room for further evaluation and management.     ED Prescriptions   None    PDMP not reviewed this encounter.   Terrilee Croak, PA-C 03/20/22 1355

## 2022-03-20 NOTE — ED Provider Notes (Signed)
[facility] EMERGENCY DEPT Provider Note   CSN: [id number] Arrival date & time: 03/20/22  1439     History  Chief Complaint  Patient presents with   Abdominal Pain    Yesenia Wheeler is a 51 y.o. female.  Patient with 2 days of upper abdominal pain that onset acutely after drinking alcohol which she does not normally do.  The pain is a constant burning to her upper abdomen that is worse when she tries to eat.  Associated with nausea but no vomiting.  She denies any history of ulcers, acid reflux or esophagitis.  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  Denies any excessive alcohol or NSAID use.  No diarrhea.  No black or bloody stools.  No fever, chills, vomiting.  No pain with urination or blood in the urine.  No vaginal bleeding or discharge.  No chest pain.  Has not had this pain previously.  Reports an EGD remotely when she had a gastric bypass about 20 years ago.  The history is provided by the patient and a relative.  Abdominal Pain Associated symptoms: nausea   Associated symptoms: no cough, no shortness of breath and no vomiting        Home Medications Prior to Admission medications   Medication Sig Start Date End Date Taking? Authorizing Provider  albuterol (VENTOLIN HFA) 108 (90 Base) MCG/ACT inhaler Inhale into the lungs every 6 (six) hours as needed for wheezing or shortness of breath.    [provider]  DULoxetine (CYMBALTA) 30 MG capsule Take 1 capsule (30 mg total) by mouth daily. 10/28/21 04/26/22  Delene Ruffini, MD  ELIQUIS 5 MG TABS tablet TAKE 1 TABLET BY MOUTH TWICE A DAY 11/24/21   Rehman, Areeg N, DO  ferrous sulfate 325 (65 FE) MG EC tablet Take 1 tablet (325 mg total) by mouth daily with breakfast. 12/29/21 03/29/22  Timothy Lasso, MD  gabapentin (NEURONTIN) 300 MG capsule 1 capsule in the morning and midday, 2 at night 03/15/21   Kathrynn Ducking, MD  meclizine (ANTIVERT) 25 MG tablet Take 1 tablet (25  mg total) by mouth 3 (three) times daily as needed for dizziness. 05/19/21   Drenda Freeze, MD  metroNIDAZOLE (FLAGYL) 500 MG tablet Take 1 tablet (500 mg total) by mouth 2 (two) times daily. 02/17/22   Lamptey, Myrene Galas, MD  enoxaparin (LOVENOX) 40 MG/0.4ML injection Inject 0.4 mLs (40 mg total) into the skin daily. Start 3.14.19 Patient not taking: Reported on 04/30/2018 10/24/17 03/16/20  Irene Limbo, MD      Allergies    Hydrocodone    Review of Systems   Review of Systems  Respiratory:  Negative for cough, chest tightness and shortness of breath.   Gastrointestinal:  Positive for abdominal pain and nausea. Negative for vomiting.  Musculoskeletal:  Negative for arthralgias and myalgias.   all other systems are negative except as noted in the HPI and PMH.    Physical Exam Updated Vital Signs BP 124/86 (BP Location: Right Arm)   Pulse 66   Temp 98.1 F (36.7 C)   Resp 14   Ht '5\' 4"'$  (1.626 m)   Wt 104.3 kg   LMP 03/09/2022   SpO2 100%   BMI 39.48 kg/m  Physical Exam Vitals and nursing note reviewed.  Constitutional:      General: She is not in acute distress.    Appearance: She is well-developed.  HENT:     Head: Normocephalic and atraumatic.  Mouth/Throat:     Pharynx: No oropharyngeal exudate.  Eyes:     Conjunctiva/sclera: Conjunctivae normal.     Pupils: Pupils are equal, round, and reactive to light.  Neck:     Comments: No meningismus. Cardiovascular:     Rate and Rhythm: Normal rate and regular rhythm.     Heart sounds: Normal heart sounds. No murmur heard. Pulmonary:     Effort: Pulmonary effort is normal. No respiratory distress.     Breath sounds: Normal breath sounds.  Abdominal:     Palpations: Abdomen is soft.     Tenderness: There is abdominal tenderness. There is guarding. There is no rebound.     Comments: Epigastric tenderness with voluntary guarding.  No lower abdominal tenderness  Musculoskeletal:        General: No tenderness. Normal  range of motion.     Cervical back: Normal range of motion and neck supple.  Skin:    General: Skin is warm.  Neurological:     Mental Status: She is alert and oriented to person, place, and time.     Cranial Nerves: No cranial nerve deficit.     Motor: No abnormal muscle tone.     Coordination: Coordination normal.     Comments:  5/5 strength throughout. CN 2-12 intact.Equal grip strength.   Psychiatric:        Behavior: Behavior normal.     ED Results / Procedures / Treatments   Labs (all labs ordered are listed, but only abnormal results are displayed) Labs Reviewed  COMPREHENSIVE METABOLIC PANEL - Abnormal; Notable for the following components:      Result Value   Calcium 8.6 (*)    All other components within normal limits  CBC - Abnormal; Notable for the following components:   Hemoglobin 10.4 (*)    HCT 32.7 (*)    MCV 77.9 (*)    MCH 24.8 (*)    RDW 17.2 (*)    All other components within normal limits  URINALYSIS, ROUTINE W REFLEX MICROSCOPIC - Abnormal; Notable for the following components:   Protein, ur TRACE (*)    Leukocytes,Ua MODERATE (*)    WBC, UA >50 (*)    All other components within normal limits  URINE CULTURE  LIPASE, BLOOD  PREGNANCY, URINE  LACTIC ACID, PLASMA  LACTIC ACID, PLASMA  TROPONIN I (HIGH SENSITIVITY)  TROPONIN I (HIGH SENSITIVITY)    EKG EKG Interpretation  Date/Time:  Monday March 20 2022 21:37:50 EDT Ventricular Rate:  66 PR Interval:  135 QRS Duration: 97 QT Interval:  419 QTC Calculation: 439 R Axis:   56 Text Interpretation: Sinus rhythm No significant change was found Confirmed by Ezequiel Essex [phone]) on 03/20/2022 9:40:06 PM  Radiology CT Angio Chest/Abd/Pel for Dissection W and/or Wo Contrast  Addendum Date: 03/20/2022   ADDENDUM REPORT: 03/20/2022 20:43 ADDENDUM: These results were called by telephone at the time of interpretation on 03/20/2022 at 8:43 pm to provider [facility] , who verbally acknowledged  these results. Electronically Signed   By: Fidela Salisbury M.D.   On: 03/20/2022 20:43   Result Date: 03/20/2022 CLINICAL DATA:  Acute aortic syndrome (AAS) suspected EXAM: CT ANGIOGRAPHY CHEST, ABDOMEN AND PELVIS TECHNIQUE: Non-contrast CT of the chest was initially obtained. Multidetector CT imaging through the chest, abdomen and pelvis was performed using the standard protocol during bolus administration of intravenous contrast. Multiplanar reconstructed images and MIPs were obtained and reviewed to evaluate the vascular anatomy. RADIATION DOSE REDUCTION: This exam was  performed according to the departmental dose-optimization program which includes automated exposure control, adjustment of the mA and/or kV according to patient size and/or use of iterative reconstruction technique. CONTRAST:  32m OMNIPAQUE IOHEXOL 350 MG/ML SOLN COMPARISON:  CT abdomen pelvis 03/20/2022, 09/28/2020. CT chest 11/25/2016 FINDINGS: CTA CHEST FINDINGS Cardiovascular: The thoracic aorta is normal in course and caliber. No intramural hematoma, dissection, or aneurysm. No significant atherosclerotic calcification. Bovine arch anatomy with wide patency of the arch vasculature proximally. There is adequate opacification the pulmonary arterial tree through the segmental level. There is a tiny intraluminal filling defect seen within the lateral segmental pulmonary artery of the left lower lobe and posterior basal segmental pulmonary artery of the right lower lobe in keeping with acute pulmonary emboli. The embolic burden is tiny. There is no CT evidence of right heart strain. The central pulmonary arteries are of normal caliber. Global cardiac size within normal limits. No significant coronary artery calcification. No pericardial effusion. Mediastinum/Nodes: 2.6 cm heterogeneously enhancing nodule is seen within the right thyroid gland, not well characterized on this examination. Shotty right axillary lymph node is nonspecific, measuring 12  mm in diameter, possibly reactive in nature. No frankly pathologic thoracic adenopathy. Esophagus unremarkable. Lungs/Pleura: Lungs are clear. No pleural effusion or pneumothorax. Musculoskeletal: No acute bone abnormality. No lytic or blastic bone lesion. Review of the MIP images confirms the above findings. CTA ABDOMEN AND PELVIS FINDINGS VASCULAR Aorta: Normal caliber aorta without aneurysm, dissection, vasculitis or significant stenosis. Celiac: Patent without evidence of aneurysm, dissection, vasculitis or significant stenosis. SMA: Patent without evidence of aneurysm, dissection, vasculitis or significant stenosis. Renals: Tiny accessory upper pole renal arteries bilaterally. Renal arteries are widely patent. Normal vascular morphology. No aneurysm or dissection. IMA: Patent without evidence of aneurysm, dissection, vasculitis or significant stenosis. Inflow: Patent without evidence of aneurysm, dissection, vasculitis or significant stenosis. Veins: No obvious venous abnormality within the limitations of this arterial phase study. Review of the MIP images confirms the above findings. NON-VASCULAR Hepatobiliary: No focal liver abnormality is seen. Status post cholecystectomy. No biliary dilatation. Pancreas: Unremarkable Spleen: Unremarkable Adrenals/Urinary Tract: Adrenal glands are unremarkable. Kidneys are normal, without renal calculi, focal lesion, or hydronephrosis. Bladder is unremarkable. Stomach/Bowel: Surgical changes of Roux-en-Y gastric bypass are identified. The stomach, small bowel, and large bowel are otherwise unremarkable. No evidence of obstruction or focal inflammation. Appendix normal. No free intraperitoneal gas or fluid. Lymphatic: No pathologic adenopathy within the abdomen and pelvis. Reproductive: Multiple enhancing masses are again seen within the uterus in keeping with multiple uterine fibroids. Pelvic organs are otherwise unremarkable. Other: No abdominal wall hernia Musculoskeletal:  No acute bone abnormality. No lytic or blastic bone lesion. Review of the MIP images confirms the above findings. IMPRESSION: 1. Acute pulmonary emboli. The embolic burden is tiny. No CT evidence of right heart strain. 2. 2.6 cm incidental right thyroid nodule. Recommend nonemergent thyroid [place] Reference: J Am Coll Radiol. 2015 Feb;12(2): 143-50 3. No acute intra-abdominal pathology identified. No aortic aneurysm or dissection. 4. Multiple uterine fibroids. 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  No obstruction. Electronically Signed: By: AFidela SalisburyM.D. On: 03/20/2022 20:29   [place]dom[place] Limited RUQ (LIVER/GB)  Result Date: 03/20/2022 CLINICAL DATA:  Epigastric pain 3 days. EXAM: ULTRASOUND ABDOMEN LIMITED RIGHT UPPER QUADRANT COMPARISON:  CT abdomen pelvis earlier same day FINDINGS: Gallbladder: Surgically absent Common bile duct: Diameter: 3 mm Liver: Increased echogenicity. No focal lesion. Portal vein is patent on color Doppler imaging with normal direction of blood  flow towards the liver. Other: None. IMPRESSION: Prior cholecystectomy Increased hepatic parenchymal echogenicity suggestive of steatosis. Electronically Signed   By: Lovey Newcomer M.D.   On: 03/20/2022 19:00   CT ABDOMEN PELVIS W CONTRAST  Result Date: 03/20/2022 CLINICAL DATA:  Abdominal pain EXAM: CT ABDOMEN AND PELVIS WITH CONTRAST TECHNIQUE: Multidetector CT imaging of the abdomen and pelvis was performed using the standard protocol following bolus administration of intravenous contrast. RADIATION DOSE REDUCTION: This exam was performed according to the departmental dose-optimization program which includes automated exposure control, adjustment of the mA and/or kV according to patient size and/or use of iterative reconstruction technique. CONTRAST:  158m OMNIPAQUE IOHEXOL 300 MG/ML  SOLN COMPARISON:  CT abdomen and pelvis dated September 28, 2020 FINDINGS: Lower chest: Solid pulmonary nodule of the right lower lobe measuring 4  mm on series 4, image 11, stable when compared with prior exam and considered benign. No acute abnormality. Hepatobiliary: No focal liver abnormality is seen. Status post cholecystectomy. No biliary dilatation. Pancreas: Unremarkable. No pancreatic ductal dilatation or surrounding inflammatory changes. Spleen: Normal in size without focal abnormality. Adrenals/Urinary Tract: Adrenal glands are unremarkable. Kidneys are normal, without renal calculi, focal lesion, or hydronephrosis. Bladder is unremarkable. Stomach/Bowel: Postsurgical changes of gastric bypass. Normal appendix. No abdominal wall thickening, inflammatory change or evidence of obstruction. Vascular/Lymphatic: No significant vascular findings are present. No enlarged abdominal or pelvic lymph nodes. Reproductive: Enlarged fibroid uterus, several of the fibroids appear increased in size when compared with prior exam. Reference exophytic fibroid of the anterior uterus measuring 4.9 x 2.9 cm, previously 3.8 x 2.4 cm. Other: No abdominal wall hernia or abnormality. No abdominopelvic ascites. Musculoskeletal: No acute or significant osseous findings. IMPRESSION: 1. No acute findings in the abdomen or pelvis. 2. Enlarged fibroid uterus, several of the fibroids are increased in size when compared with prior exam. Could be further evaluated with pelvic ultrasound. Electronically Signed   By: LYetta GlassmanM.D.   On: 03/20/2022 16:30    Procedures Procedures    Medications Ordered in ED Medications  sodium chloride 0.9 % bolus 1,000 mL (has no administration in time range)  ondansetron (ZOFRAN) injection 4 mg (has no administration in time range)  morphine (PF) 4 MG/ML injection 4 mg (has no administration in time range)  pantoprazole (PROTONIX) injection 40 mg (has no administration in time range)    ED Course/ Medical Decision Making/ A&P                           Medical Decision Making Amount and/or Complexity of Data Reviewed Labs:  ordered. Radiology: ordered and independent interpretation performed. Decision-making details documented in ED Course. ECG/medicine tests: ordered and independent interpretation performed. Decision-making details documented in ED Course.  Risk OTC drugs. Prescription drug management.  2 days of upper abdominal pain with nausea after drinking alcohol.  Vital stable, no distress, no peritoneal signs.  Labs show stable anemia.  No leukocytosis.  LFTs and lipase are at baseline.  Pyuria noted and culture will be sent.  She denies any urinary symptoms.  CT scan to be obtained given her history of gastric bypass and previous cholecystectomy.  Patient patient CT scan does not show any acute surgical pathology.  No free air or bowel obstruction.  However she still had persistent diffuse abdominal pain that was difficult to control.  CTA imaging was obtained to ensure there is no aortic pathology.  Aorta is normal.  No evidence of  dissection.  Patient does have new small bilateral pulmonary emboli without evidence of right heart strain.  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  She states she missed several doses last week and otherwise has been compliant.  Discussed with pharmacy who states would be unusual for her to have new clots after only missing a couple doses.  He recommends consideration of treatment failure.  D/w Dr. Burr Medico of oncology.  She states with only 2 missed doses would not consider this a treatment failure.  Recommends restarting loading dose of 10 mg twice daily for 1 week then 5 mg BID after this.  Discussed with patient.  She has no hypoxia or increased work of breathing.  No evidence of right heart strain.  We will reinitiate Eliquis loading dose and then go back to 5 mg daily on day 8.  She is tolerating p.o.  Suspect her abdominal pain likely secondary to alcoholic gastritis or esophagitis.  We will start PPI.  Avoid alcohol, caffeine, NSAID medications, spicy foods.   Follow-up with gastroenterology for EGD. Continue Eliquis as above.  Return precautions discussed        Final Clinical Impression(s) / ED Diagnoses Final diagnoses:  Epigastric pain  Multiple subsegmental pulmonary emboli without acute [facility] pulmon[facility])    Rx / DC Orders ED Discharge Orders     None         Abijah Roussel, Annie Main, MD 03/20/22 2345

## 2022-03-20 NOTE — Discharge Instructions (Addendum)
Your testing is reassuring.  No acute surgical problem seen on your CT scan.  Avoid alcohol, caffeine, NSAID medications, spicy foods and take the stomach medication as prescribed.  Follow-up for an endoscopy where the gastroenterologist looks of your stomach with a camera.  Incidentally we found to have 2 small blood clots in your lungs likely from missing the doses of Eliquis last week.  You should increase your dose to 10 mg twice daily for the next 7 days and then 5 mg twice daily after this. Follow-up with your primary doctor.  Return to the ED with worsening pain, chest pain, shortness of breath, nausea, vomiting, other concerns

## 2022-03-20 NOTE — Discharge Instructions (Signed)
Given the severity of your pain I do think you should go to the emergency room for further evaluation and management.

## 2022-03-21 LAB — URINE CULTURE: Culture: NO GROWTH

## 2022-03-27 ENCOUNTER — Other Ambulatory Visit: Payer: Self-pay | Admitting: Internal Medicine

## 2023-01-10 ENCOUNTER — Ambulatory Visit (HOSPITAL_COMMUNITY)
Admission: EM | Admit: 2023-01-10 | Discharge: 2023-01-10 | Disposition: A | Discharge status: Home / Self Care | Payer: PRIVATE HEALTH INSURANCE | Attending: Sports Medicine | Admitting: Sports Medicine

## 2023-01-10 ENCOUNTER — Encounter (HOSPITAL_COMMUNITY): Payer: Self-pay

## 2023-01-10 DIAGNOSIS — I509 Heart failure, unspecified: Secondary | ICD-10-CM | POA: Diagnosis not present

## 2023-01-10 MED ORDER — FUROSEMIDE 40 MG PO TABS: 40.0000 mg | ORAL_TABLET | Freq: Every day | ORAL | 0 refills | Status: DC

## 2023-01-10 NOTE — ED Provider Notes (Signed)
[facility]ARE CENTER   [id number] 01/10/23 Arrival Time: 0903  ASSESSMENT & PLAN:  1. Congestive heart failure, NYHA class 2, unspecified congestive heart failure type [facility])    -Clinical presentation is consistent with at least New York Heart Association class II heart failure.  Discussed that she will need to establish with a cardiologist to have further workup/plan of care.  Will give her a short-term course of Lasix to help with symptomatic fluid overload.  All question answered she agrees to plan.  Meds ordered this encounter  Medications   furosemide (LASIX) 40 MG tablet    Sig: Take 1 tablet (40 mg total) by mouth daily.    Dispense:  14 tablet    Refill:  0     Discharge Instructions      Take Lasix 40mg  daily  Make an appt with Dr. Clifton James to talk about your heat function  It was nice to meet you today!      Follow-up Information     Schedule an appointment as soon as possible for a visit  with Kathleene Hazel, MD.   Specialty: Cardiology Contact information: [address] [place] [address] [phone]                  Reviewed expectations re: course of current medical issues. Questions answered. Outlined signs and symptoms indicating need for more acute intervention. Patient verbalized understanding. After Visit Summary given.   SUBJECTIVE: Pleasant 52 year old female comes urgent care to be evaluated for bilateral ankle swelling.  She says her ankles been swollen for about 2 weeks.  No inciting injury or trauma.  She cannot lay flat while sleeping.  She can only walk about 2 blocks before she becomes short of breath.  She does snore at night but denies waking up gasping for air in the night.  She has been trying to prop up her feet which helps a little.  She also has compression stockings.  She tried taking 1 of her boyfriend's Lasix which gave her some relief temporarily.  No fevers, cough, chest pain.  No LMP  recorded. Past Surgical History:  Procedure Laterality Date   BREAST LUMPECTOMY WITH RADIOACTIVE SEED LOCALIZATION Left 10/23/2017   Procedure: BREAST LUMPECTOMY WITH RADIOACTIVE SEED LOCALIZATION;  Surgeon: Harriette Bouillon, MD;  Location: [facility];  Service: General;  Laterality: Left;   BREAST REDUCTION SURGERY Bilateral 10/23/2017   Procedure: LEFT BREAST ONCOPLASTIC REDUCTION, RIGHT BREAST REDUCTION;  Surgeon: Glenna Fellows, MD;  Location: [facility];  Service: Plastics;  Laterality: Bilateral;   CESAREAN SECTION  2011   GASTRIC BYPASS  2001   KNEE ARTHROSCOPY W/ MENISCECTOMY Right 2007   KNEE ARTHROSCOPY WITH MEDIAL MENISECTOMY Left 08/30/2017   Procedure: KNEE ARTHROSCOPY WITH PARTIAL MEDIAL MENISECTOMY;  Surgeon: Yolonda Kida, MD;  Location: [facility];  Service: Orthopedics;  Laterality: Left;  60 mins   LAPAROSCOPIC CHOLECYSTECTOMY  2001   SHOULDER ARTHROSCOPY Left 2008   spur     OBJECTIVE:  Vitals:   01/10/23 0923  BP: 138/79  Pulse: 76  Resp: 18  Temp: 98.5 F (36.9 C)  TempSrc: Oral  SpO2: 97%     Physical Exam Vitals and nursing note reviewed.  Constitutional:      General: She is not in acute distress.    Appearance: Normal appearance.  Cardiovascular:     Rate and Rhythm: Normal rate and regular rhythm.     Heart sounds: Normal  heart sounds.  Pulmonary:     Effort: Pulmonary effort is normal.     Breath sounds: Normal breath sounds.  Abdominal:     General: Abdomen is flat.     Palpations: Abdomen is soft.     Tenderness: There is no abdominal tenderness.  Musculoskeletal:        General: Normal range of motion.     Comments: Lower extremities bilateral 2+ pitting edema up to the level of the mid shin  Skin:    General: Skin is warm.  Neurological:     General: No focal deficit present.  Psychiatric:        Mood and Affect: Mood normal.      Labs: Results for orders placed or  performed during the hospital encounter of 03/20/22  Urine Culture   Specimen: Urine, Clean Catch  Result Value Ref Range   Specimen Description      URINE, CLEAN CATCH Performed at [facility], [address], [place], [place] [address]    Special Requests      NONE Performed at [facility], [address] [place], [place] [address]    Culture      NO GROWTH Performed at [facility], [address] [place], [place] [address]    Report Status 03/21/2022 FINAL   Lipase, blood  Result Value Ref Range   Lipase 29 11 - 51 U/L  Comprehensive metabolic panel  Result Value Ref Range   Sodium 138 135 - 145 mmol/L   Potassium 3.5 3.5 - 5.1 mmol/L   Chloride 106 98 - 111 mmol/L   CO2 26 22 - 32 mmol/L   Glucose, Bld 89 70 - 99 mg/dL   BUN 10 6 - 20 mg/dL   Creatinine, Ser 9.14 0.44 - 1.00 mg/dL   Calcium 8.6 (L) 8.9 - 10.3 mg/dL   Total Protein 7.2 6.5 - 8.1 g/dL   Albumin 3.8 3.5 - 5.0 g/dL   AST 18 15 - 41 U/L   ALT 11 0 - 44 U/L   Alkaline Phosphatase 100 38 - 126 U/L   Total Bilirubin 0.5 0.3 - 1.2 mg/dL   GFR, Estimated >78 >29 mL/min   Anion gap 6 5 - 15  CBC  Result Value Ref Range   WBC 5.4 4.0 - 10.5 K/uL   RBC 4.20 3.87 - 5.11 MIL/uL   Hemoglobin 10.4 (L) 12.0 - 15.0 g/dL   HCT 56.2 (L) 13.0 - 86.5 %   MCV 77.9 (L) 80.0 - 100.0 fL   MCH 24.8 (L) 26.0 - 34.0 pg   MCHC 31.8 30.0 - 36.0 g/dL   RDW 78.4 (H) 69.6 - 29.5 %   Platelets 314 150 - 400 K/uL   nRBC 0.0 0.0 - 0.2 %  Urinalysis, Routine w reflex microscopic Urine, Clean Catch  Result Value Ref Range   Color, Urine YELLOW YELLOW   APPearance CLEAR CLEAR   Specific Gravity, Urine 1.028 1.005 - 1.030   pH 6.0 5.0 - 8.0   Glucose, UA NEGATIVE NEGATIVE mg/dL   Hgb urine dipstick NEGATIVE NEGATIVE   Bilirubin Urine NEGATIVE NEGATIVE   Ketones, ur NEGATIVE NEGATIVE mg/dL   Protein, ur TRACE (A) NEGATIVE mg/dL   Nitrite NEGATIVE NEGATIVE   Leukocytes,Ua  MODERATE (A) NEGATIVE   RBC / HPF 0-5 0 - 5 RBC/hpf   WBC, UA >50 (H) 0 - 5 WBC/hpf   Squamous Epithelial / HPF 0-5 0 - 5   Mucus  PRESENT   Pregnancy, urine  Result Value Ref Range   Preg Test, Ur NEGATIVE NEGATIVE  Lactic acid, plasma  Result Value Ref Range   Lactic Acid, Venous 1.4 0.5 - 1.9 mmol/L  Troponin I (High Sensitivity)  Result Value Ref Range   Troponin I (High Sensitivity) 5 <18 ng/L   Labs Reviewed - No data to display  Imaging: No results found.   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    SBO (small bowel obstruction) (HCC) 09/2020    Social History   Socioeconomic History   Marital status: Married    Spouse name: Caryn Bee   Number of children: 2   Years of education: Associates   Highest education level: Not on file  Occupational History   Occupation: LPN    Employer: [employer]  Tobacco Use   Smoking status: Former    Years: 6    Types: Cigarettes    Quit date: 06/23/2017    Years since quitting: 5.5    Passive exposure: Past   Smokeless tobacco: Never   Tobacco comments:    2ppwk  Vaping Use   Vaping Use: Every day   Substances: Nicotine, Flavoring  Substance and Sexual Activity   Alcohol use: Yes    Comment: occasional   Drug use: No   Sexual activity: Yes    Birth control/protection: None  Other Topics Concern   Not on file  Social History Narrative   Lives with her husband and their children   Right Handed    Drinks >10 cups caffeine   Social Determinants of Health   Financial Resource Strain: Not on file  Food Insecurity: Not on file  Transportation Needs: Not on file  Physical Activity: Not on file  Stress: Not on file  Social Connections: Not on file  Intimate Partner Violence: Not on file    Family History  Problem Relation Age of Onset   Cancer Father        oral      Eirene Rather, Baldemar Friday, MD 01/10/23 1032

## 2023-01-10 NOTE — Discharge Instructions (Addendum)
Take Lasix 40mg  daily  Make an appt with Dr. Clifton James to talk about your heat function  It was nice to meet you today!

## 2023-01-10 NOTE — ED Triage Notes (Signed)
Pt reports bilateral feet swelling and pain x 3 days.

## 2023-01-12 ENCOUNTER — Encounter (HOSPITAL_COMMUNITY): Payer: Self-pay

## 2023-01-12 ENCOUNTER — Other Ambulatory Visit: Payer: Self-pay

## 2023-01-12 ENCOUNTER — Emergency Department (HOSPITAL_COMMUNITY)
Admission: EM | Admit: 2023-01-12 | Discharge: 2023-01-12 | Disposition: A | Discharge status: Home / Self Care | Payer: PRIVATE HEALTH INSURANCE | Attending: Emergency Medicine | Admitting: Emergency Medicine

## 2023-01-12 ENCOUNTER — Emergency Department (HOSPITAL_COMMUNITY): Payer: PRIVATE HEALTH INSURANCE

## 2023-01-12 DIAGNOSIS — R0602 Shortness of breath: Secondary | ICD-10-CM | POA: Insufficient documentation

## 2023-01-12 DIAGNOSIS — R079 Chest pain, unspecified: Secondary | ICD-10-CM | POA: Diagnosis present

## 2023-01-12 DIAGNOSIS — I509 Heart failure, unspecified: Secondary | ICD-10-CM | POA: Diagnosis not present

## 2023-01-12 DIAGNOSIS — Z86718 Personal history of other venous thrombosis and embolism: Secondary | ICD-10-CM | POA: Diagnosis not present

## 2023-01-12 DIAGNOSIS — Z7901 Long term (current) use of anticoagulants: Secondary | ICD-10-CM | POA: Insufficient documentation

## 2023-01-12 DIAGNOSIS — Z86711 Personal history of pulmonary embolism: Secondary | ICD-10-CM | POA: Diagnosis not present

## 2023-01-12 DIAGNOSIS — M7989 Other specified soft tissue disorders: Secondary | ICD-10-CM | POA: Diagnosis not present

## 2023-01-12 LAB — TROPONIN I (HIGH SENSITIVITY): Troponin I (High Sensitivity): 5 ng/L (ref <18)

## 2023-01-12 LAB — BASIC METABOLIC PANEL
Anion gap: 14 (ref 5–15)
BUN: 13 mg/dL (ref 6–20)
CO2: 20 mmol/L — ABNORMAL LOW (ref 22–32)
Calcium: 8.8 mg/dL — ABNORMAL LOW (ref 8.9–10.3)
Chloride: 103 mmol/L (ref 98–111)
Creatinine, Ser: 1.03 mg/dL — ABNORMAL HIGH (ref 0.44–1.00)
GFR, Estimated: >60 mL/min (ref >60)
Glucose, Bld: 87 mg/dL (ref 70–99)
Potassium: 3.6 mmol/L (ref 3.5–5.1)
Sodium: 137 mmol/L (ref 135–145)

## 2023-01-12 LAB — CBC WITH DIFFERENTIAL/PLATELET
Abs Immature Granulocytes: 0.02 10*3/uL (ref 0.00–0.07)
Basophils Absolute: 0 10*3/uL (ref 0.0–0.1)
Basophils Relative: 1 %
Eosinophils Absolute: 0.1 10*3/uL (ref 0.0–0.5)
Eosinophils Relative: 1 %
HCT: 30.2 % — ABNORMAL LOW (ref 36.0–46.0)
Hemoglobin: 9.1 g/dL — ABNORMAL LOW (ref 12.0–15.0)
Immature Granulocytes: 0 %
Lymphocytes Relative: 34 %
Lymphs Abs: 2.2 10*3/uL (ref 0.7–4.0)
MCH: 22.1 pg — ABNORMAL LOW (ref 26.0–34.0)
MCHC: 30.1 g/dL (ref 30.0–36.0)
MCV: 73.3 fL — ABNORMAL LOW (ref 80.0–100.0)
Monocytes Absolute: 0.4 10*3/uL (ref 0.1–1.0)
Monocytes Relative: 6 %
Neutro Abs: 3.8 10*3/uL (ref 1.7–7.7)
Neutrophils Relative %: 58 %
Platelets: 283 10*3/uL (ref 150–400)
RBC: 4.12 MIL/uL (ref 3.87–5.11)
RDW: 17.3 % — ABNORMAL HIGH (ref 11.5–15.5)
WBC: 6.6 10*3/uL (ref 4.0–10.5)
nRBC: 0 % (ref 0.0–0.2)

## 2023-01-12 LAB — LIPASE, BLOOD: Lipase: 30 U/L (ref 11–51)

## 2023-01-12 LAB — BRAIN NATRIURETIC PEPTIDE: B Natriuretic Peptide: 14.1 pg/mL (ref 0.0–100.0)

## 2023-01-12 MED ORDER — SUCRALFATE 1 GM/10ML PO SUSP
1.0000 g | ORAL | Status: AC
  Administered 2023-01-12: 1 g via ORAL
  Filled 2023-01-12: qty 10

## 2023-01-12 MED ORDER — FAMOTIDINE IN NACL 20-0.9 MG/50ML-% IV SOLN
20.0000 mg | INTRAVENOUS | Status: AC
  Administered 2023-01-12: 20 mg via INTRAVENOUS
  Filled 2023-01-12: qty 50

## 2023-01-12 MED ORDER — SUCRALFATE 1 GM/10ML PO SUSP: 1.0000 g | Freq: Three times a day (TID) | ORAL | Status: DC

## 2023-01-12 NOTE — Discharge Instructions (Addendum)
It was a pleasure caring for you today. Lab work was reassuring.  I sent in a referral for you for cardiology.  They should reach out to you within 72 hours.  If not please call the number listed on your discharge paperwork.  Is important very important that you receive cardiology follow-up within the next week.  Seek emergency care if experiencing any new or worsening symptoms such as trouble breathing, severe abdominal pain, loss of consciousness.

## 2023-01-12 NOTE — ED Triage Notes (Signed)
BIBA for increased SOB, chest pain, and leg edema. Seen in UC on Tuesday for same, told has CHF and given 40mg  lasix, feels worse, went to different UC and sent here. A&Ox4, received 324 ASA

## 2023-01-12 NOTE — ED Provider Notes (Signed)
[place] EMERGENCY DEPARTMENT AT [facility] Provider Note   CSN: [id number] Arrival date & time: 01/12/23  1541     History  Chief Complaint  Patient presents with   Chest Pain    Yesenia Wheeler is a 52 y.o. female with PMHx s/f DVT/PE on Eliquis, and anemia who presents to ED complaining of chest pain, SOB, bilateral leg swelling. Diagnosed with CHF earlier this week at Urgent Care and discharged with 40mg  Lasix PO once daily. Patient has been complaint with medication and reports that leg swelling has significantly decreased. Patient is concerned because chest pain is constant and not resolving. Patient unable to lay flat due to SOB and pain. Pain starts under left breast and radiates around to back - not relieved with Tylenol. Pain is described as pressure and hurts with palpation. 25lb weight gain over the past year.   Denies fever, cough, abdominal pain, vomiting, diarrhea, recent illness. Pain is not positional. Denies past history of chest pain.   Chest Pain Associated symptoms: shortness of breath        Home Medications Prior to Admission medications   Medication Sig Start Date End Date Taking? Authorizing Provider  albuterol (VENTOLIN HFA) 108 (90 Base) MCG/ACT inhaler Inhale into the lungs every 6 (six) hours as needed for wheezing or shortness of breath.    [provider]  APIXABAN Everlene Balls) VTE STARTER PACK (10MG  AND 5MG ) Take as directed on package: start with two-5mg  tablets twice daily for 7 days. On day 8, switch to one-5mg  tablet twice daily. 03/20/22   Rancour, Jeannett Senior, MD  DULoxetine (CYMBALTA) 30 MG capsule Take 1 capsule (30 mg total) by mouth daily. 10/28/21 04/26/22  Adron Bene, MD  ELIQUIS 5 MG TABS tablet TAKE 1 TABLET BY MOUTH TWICE A DAY 11/24/21   Rehman, Areeg N, DO  ferrous sulfate 325 (65 FE) MG EC tablet TAKE 1 TABLET BY MOUTH EVERY DAY WITH BREAKFAST 03/27/22   Steffanie Rainwater, MD  furosemide (LASIX) 40 MG tablet Take  1 tablet (40 mg total) by mouth daily. 01/10/23   Rafoth, Baldemar Friday, MD  gabapentin (NEURONTIN) 300 MG capsule 1 capsule in the morning and midday, 2 at night 03/15/21   York Spaniel, MD  meclizine (ANTIVERT) 25 MG tablet Take 1 tablet (25 mg total) by mouth 3 (three) times daily as needed for dizziness. 05/19/21   Charlynne Pander, MD  metroNIDAZOLE (FLAGYL) 500 MG tablet Take 1 tablet (500 mg total) by mouth 2 (two) times daily. 02/17/22   Merrilee Jansky, MD  omeprazole (PRILOSEC) 20 MG capsule Take 1 capsule (20 mg total) by mouth daily. 03/20/22   Rancour, Jeannett Senior, MD  enoxaparin (LOVENOX) 40 MG/0.4ML injection Inject 0.4 mLs (40 mg total) into the skin daily. Start 3.14.19 Patient not taking: Reported on 04/30/2018 10/24/17 03/16/20  Glenna Fellows, MD      Allergies    Hydrocodone    Review of Systems   Review of Systems  Respiratory:  Positive for shortness of breath.   Cardiovascular:  Positive for chest pain and leg swelling.    Physical Exam Updated Vital Signs BP 122/76   Pulse 66   Temp 98.8 F (37.1 C) (Oral)   Resp 10   SpO2 100%  Physical Exam Vitals and nursing note reviewed.  Constitutional:      General: She is not in acute distress.    Appearance: She is not ill-appearing or toxic-appearing.  HENT:     Head:  Normocephalic and atraumatic.     Mouth/Throat:     Mouth: Mucous membranes are moist.     Pharynx: No oropharyngeal exudate or posterior oropharyngeal erythema.  Eyes:     General: No scleral icterus.       Right eye: No discharge.        Left eye: No discharge.     Conjunctiva/sclera: Conjunctivae normal.  Neck:     Comments: No JVD appreciated bilaterally Cardiovascular:     Rate and Rhythm: Normal rate and regular rhythm.     Pulses: Normal pulses.          Radial pulses are 2+ on the right side and 2+ on the left side.       Dorsalis pedis pulses are 2+ on the right side and 2+ on the left side.     Heart sounds: Normal heart sounds. No  murmur heard. Pulmonary:     Effort: Pulmonary effort is normal. No respiratory distress.     Breath sounds: Normal breath sounds. No wheezing, rhonchi or rales.  Abdominal:     General: Bowel sounds are normal.     Palpations: Abdomen is soft. There is no mass.     Tenderness: There is no abdominal tenderness.  Musculoskeletal:     Right lower leg: No tenderness. No edema.     Left lower leg: No tenderness. No edema.     Comments: No pitting edema of lower extremities bilaterally. No pain to palpation of bilateral calves. Lower extremities are symmetrically mildly swollen.  Skin:    General: Skin is warm and dry.     Findings: No rash.  Neurological:     General: No focal deficit present.     Mental Status: She is alert. Mental status is at baseline.  Psychiatric:        Mood and Affect: Mood normal.        Behavior: Behavior normal.     ED Results / Procedures / Treatments   Labs (all labs ordered are listed, but only abnormal results are displayed) Labs Reviewed  CBC WITH DIFFERENTIAL/PLATELET - Abnormal; Notable for the following components:      Result Value   Hemoglobin 9.1 (*)    HCT 30.2 (*)    MCV 73.3 (*)    MCH 22.1 (*)    RDW 17.3 (*)    All other components within normal limits  BASIC METABOLIC PANEL - Abnormal; Notable for the following components:   CO2 20 (*)    Creatinine, Ser 1.03 (*)    Calcium 8.8 (*)    All other components within normal limits  BRAIN NATRIURETIC PEPTIDE  LIPASE, BLOOD  TROPONIN I (HIGH SENSITIVITY)    EKG EKG Interpretation  Date/Time:  Friday Jan 12 2023 15:52:16 EDT Ventricular Rate:  67 PR Interval:  134 QRS Duration: 79 QT Interval:  410 QTC Calculation: 433 R Axis:   28 Text Interpretation: Sinus rhythm No significant change since last tracing Confirmed by Melene Plan [phone]) on 01/12/2023 4:13:56 PM  Radiology DG Chest Port 1 View  Result Date: 01/12/2023 CLINICAL DATA:  Shortness of breath EXAM: PORTABLE CHEST 1  VIEW COMPARISON:  12/26/2021 FINDINGS: Transverse diameter of heart is increased. There are no signs of pulmonary edema or focal pulmonary consolidation. There is no pleural effusion or pneumothorax. IMPRESSION: No active cardiopulmonary disease. Electronically Signed   By: Ernie Avena M.D.   On: 01/12/2023 17:30    Procedures Procedures    Medications Ordered  in ED Medications  famotidine (PEPCID) IVPB 20 mg premix (0 mg Intravenous Stopped 01/12/23 1936)  sucralfate (CARAFATE) 1 GM/10ML suspension 1 g (1 g Oral Given 01/12/23 1856)    ED Course/ Medical Decision Making/ A&P                             Medical Decision Making Amount and/or Complexity of Data Reviewed Labs: ordered. Radiology: ordered.   This patient presents to the ED for concern of shortness of breath, chest pain, and bilateral leg swelling this involves an extensive number of treatment options, and is a complaint that carries with it a high risk of complications and morbidity.  The differential diagnosis includes Anxiety, Anaphylaxis/Angioedema, Aspirated FB, Arrhythmia, CHF, Asthma, COPD, PNA, COVID/Flu/RSV, STEMI, Tamponade, TPNX, DKA, Sepsis, Toxin   Co morbidities that complicate the patient evaluation  CHF; DVT/PE on Eliquis    Lab Tests:  I Ordered, and personally interpreted labs.  The pertinent results include:   - BMP: no concern for electrolyte abnormality; not concerning for kidney damage - BNP: 14 - Trop: 5 - CBC: anemia that is baseline for patient; no leukocytosis - Lipase: within normal limits   Imaging Studies ordered:  I ordered imaging studies including  -chest xray: no active cardiopulmonary disease I independently visualized and interpreted imaging I agree with the radiologist interpretation   Cardiac Monitoring: / EKG:  The patient was maintained on a cardiac monitor.  I personally viewed and interpreted the cardiac monitored which showed an underlying rhythm of: sinus  rhythm without acute ST changes or arrhythmias    Problem List / ED Course / Critical interventions / Medication management  Patient presents to the ED concerned shortness of breath, chest pain, leg swelling.  Patient reports that leg swelling has greatly resolved since starting Lasix 40mg  2 days ago.  Physical exam with no pitting edema or JVD.  Patient with past medical history of DVT/PE currently on Eliquis and denying unilateral leg swelling or calf tenderness.  Physical exam not concerning for DVT/PE.  Patient oxygen 100% and not tachycardic. Patient stating that chest pain starts in her left breast and radiates to her back.  No rashes or lesion present on skin.  Patient endorsing that pain is reproducible to palpation.  Chest pain has been present for 4 days-troponins not elevated and EKG reassuring.  I do not believe patient's chest pain is due to acute coronary syndrome or acute myocardial infarction at this time.  Provided patient with GI cocktail in which patient endorses some symptom relief.  Given that pain is reproducible to palpation, I am most concerned that patient's chest pain is due to musculoskeletal complaint.  Also educated patient that this pain could be due to the first signs of shingles rash.  Educated patient to watch out for rash and follow-up with primary care if rash occurs.  Given that patient's chest pain was somewhat resolved with GI cocktail, pain could also be from acid reflux. Given patient's newly diagnosed CHF at urgent care and constant chest pain I have referred patient to cardiologist.  Educated patient that she should be hearing from cardiologist within 72 hours, but if in she does not receive a phone call she should call the number provided in discharge paperwork. I have reviewed the patients home medicines and have made adjustments as needed Patient was given return precautions. Patient stable for discharge at this time. Patient verbalized understanding of  plan.  DDx: These are considered less likely due to history of present illness and physical exam findings Anaphylaxis/Angioedema: physical exam reassuring; O2 100% Arrhythmia: EKG with NSR Asthma/COPD/PNA/COVID/Flu/RSV: Lungs clear to auscultation bilaterally and O2 sat 100%; denies cough STEMI: EKG and troponin reassuring Tamponade: chest xray reassuring TPNX: Lungs clear to auscultation bilaterally; chest xray without concern DKA: no hx DM; BG 87 Sepsis: afebrile and other vital signs stable Shingles: physical exam not concerning PE: physical exam unremarkable, patient on long term anticoagulation; no tachycardia or hypoxia     Social Determinants of Health:  none           Final Clinical Impression(s) / ED Diagnoses Final diagnoses:  Chest pain, unspecified type  Leg swelling    Rx / DC Orders ED Discharge Orders          Ordered    Ambulatory referral to Cardiology       Comments: If you have not heard from the Cardiology office within the next 72 hours please call [phone].   01/12/23 1944              Dorthy Cooler, PA-C 01/12/23 2132    Melene Plan, DO 01/12/23 2318

## 2023-01-17 ENCOUNTER — Encounter: Payer: Self-pay | Admitting: *Deleted

## 2023-01-24 ENCOUNTER — Encounter: Payer: PRIVATE HEALTH INSURANCE | Admitting: Internal Medicine

## 2023-01-24 NOTE — Progress Notes (Deleted)
Re-establish care   Recent ED visit for chest pain 5/31 chest pain starts in her left breast and radiates to her back. reproducible to palpation.  Chest pain has been present for 4 days-troponins not elevated and EKG reassuring   BNP 14 Trop 5 flat   Recurrent DVT/PE on eliquis  Started on lasix with new dx of CHF 5/29 Referred to cards in ed  Microcytic anemia Iron deficie Baseline hgb 8.4-10 Iron studies 5/23 with iron sat 6%

## 2023-01-29 ENCOUNTER — Emergency Department (HOSPITAL_COMMUNITY): Payer: No Typology Code available for payment source

## 2023-01-29 ENCOUNTER — Other Ambulatory Visit: Payer: Self-pay

## 2023-01-29 ENCOUNTER — Other Ambulatory Visit (HOSPITAL_COMMUNITY): Payer: Self-pay

## 2023-01-29 ENCOUNTER — Emergency Department (HOSPITAL_BASED_OUTPATIENT_CLINIC_OR_DEPARTMENT_OTHER): Payer: No Typology Code available for payment source

## 2023-01-29 ENCOUNTER — Observation Stay (HOSPITAL_COMMUNITY)
Admission: EM | Admit: 2023-01-29 | Discharge: 2023-01-30 | Disposition: A | Discharge status: Home / Self Care | Payer: No Typology Code available for payment source | Attending: Internal Medicine | Admitting: Internal Medicine

## 2023-01-29 ENCOUNTER — Encounter (HOSPITAL_COMMUNITY): Payer: Self-pay | Admitting: *Deleted

## 2023-01-29 DIAGNOSIS — I82451 Acute embolism and thrombosis of right peroneal vein: Secondary | ICD-10-CM

## 2023-01-29 DIAGNOSIS — I509 Heart failure, unspecified: Secondary | ICD-10-CM | POA: Insufficient documentation

## 2023-01-29 DIAGNOSIS — Z7901 Long term (current) use of anticoagulants: Secondary | ICD-10-CM | POA: Diagnosis not present

## 2023-01-29 DIAGNOSIS — Z79899 Other long term (current) drug therapy: Secondary | ICD-10-CM | POA: Insufficient documentation

## 2023-01-29 DIAGNOSIS — Z87891 Personal history of nicotine dependence: Secondary | ICD-10-CM | POA: Diagnosis not present

## 2023-01-29 DIAGNOSIS — I82491 Acute embolism and thrombosis of other specified deep vein of right lower extremity: Secondary | ICD-10-CM | POA: Diagnosis not present

## 2023-01-29 DIAGNOSIS — J45909 Unspecified asthma, uncomplicated: Secondary | ICD-10-CM | POA: Insufficient documentation

## 2023-01-29 DIAGNOSIS — M79609 Pain in unspecified limb: Secondary | ICD-10-CM

## 2023-01-29 DIAGNOSIS — M79661 Pain in right lower leg: Secondary | ICD-10-CM | POA: Diagnosis present

## 2023-01-29 DIAGNOSIS — I2699 Other pulmonary embolism without acute cor pulmonale: Secondary | ICD-10-CM | POA: Diagnosis not present

## 2023-01-29 LAB — IRON AND TIBC
Iron: 12 ug/dL — ABNORMAL LOW (ref 28–170)
Saturation Ratios: 3 % — ABNORMAL LOW (ref 10.4–31.8)
TIBC: 456 ug/dL — ABNORMAL HIGH (ref 250–450)
UIBC: 444 ug/dL

## 2023-01-29 LAB — CBC WITH DIFFERENTIAL/PLATELET
Abs Immature Granulocytes: 0.01 10*3/uL (ref 0.00–0.07)
Basophils Absolute: 0 10*3/uL (ref 0.0–0.1)
Basophils Relative: 1 %
Eosinophils Absolute: 0.1 10*3/uL (ref 0.0–0.5)
Eosinophils Relative: 2 %
HCT: 29.6 % — ABNORMAL LOW (ref 36.0–46.0)
Hemoglobin: 8.9 g/dL — ABNORMAL LOW (ref 12.0–15.0)
Immature Granulocytes: 0 %
Lymphocytes Relative: 28 %
Lymphs Abs: 1.7 10*3/uL (ref 0.7–4.0)
MCH: 22.3 pg — ABNORMAL LOW (ref 26.0–34.0)
MCHC: 30.1 g/dL (ref 30.0–36.0)
MCV: 74 fL — ABNORMAL LOW (ref 80.0–100.0)
Monocytes Absolute: 0.3 10*3/uL (ref 0.1–1.0)
Monocytes Relative: 5 %
Neutro Abs: 3.7 10*3/uL (ref 1.7–7.7)
Neutrophils Relative %: 64 %
Platelets: 312 10*3/uL (ref 150–400)
RBC: 4 MIL/uL (ref 3.87–5.11)
RDW: 18.3 % — ABNORMAL HIGH (ref 11.5–15.5)
WBC: 5.8 10*3/uL (ref 4.0–10.5)
nRBC: 0 % (ref 0.0–0.2)

## 2023-01-29 LAB — I-STAT BETA HCG BLOOD, ED (MC, WL, AP ONLY): I-stat hCG, quantitative: <5 m[IU]/mL (ref <5)

## 2023-01-29 LAB — BRAIN NATRIURETIC PEPTIDE: B Natriuretic Peptide: 26.7 pg/mL (ref 0.0–100.0)

## 2023-01-29 LAB — COMPREHENSIVE METABOLIC PANEL
ALT: 11 U/L (ref 0–44)
AST: 19 U/L (ref 15–41)
Albumin: 3.1 g/dL — ABNORMAL LOW (ref 3.5–5.0)
Alkaline Phosphatase: 101 U/L (ref 38–126)
Anion gap: 9 (ref 5–15)
BUN: 6 mg/dL (ref 6–20)
CO2: 22 mmol/L (ref 22–32)
Calcium: 8.4 mg/dL — ABNORMAL LOW (ref 8.9–10.3)
Chloride: 106 mmol/L (ref 98–111)
Creatinine, Ser: 0.97 mg/dL (ref 0.44–1.00)
GFR, Estimated: >60 mL/min (ref >60)
Glucose, Bld: 91 mg/dL (ref 70–99)
Potassium: 3.6 mmol/L (ref 3.5–5.1)
Sodium: 137 mmol/L (ref 135–145)
Total Bilirubin: 0.6 mg/dL (ref 0.3–1.2)
Total Protein: 6.5 g/dL (ref 6.5–8.1)

## 2023-01-29 LAB — I-STAT CHEM 8, ED
BUN: 6 mg/dL (ref 6–20)
Calcium, Ion: 1.2 mmol/L (ref 1.15–1.40)
Chloride: 106 mmol/L (ref 98–111)
Creatinine, Ser: 1 mg/dL (ref 0.44–1.00)
Glucose, Bld: 89 mg/dL (ref 70–99)
HCT: 30 % — ABNORMAL LOW (ref 36.0–46.0)
Hemoglobin: 10.2 g/dL — ABNORMAL LOW (ref 12.0–15.0)
Potassium: 3.7 mmol/L (ref 3.5–5.1)
Sodium: 140 mmol/L (ref 135–145)
TCO2: 24 mmol/L (ref 22–32)

## 2023-01-29 LAB — FERRITIN: Ferritin: 3 ng/mL — ABNORMAL LOW (ref 11–307)

## 2023-01-29 MED ORDER — ACETAMINOPHEN 325 MG PO TABS
650.0000 mg | ORAL_TABLET | Freq: Four times a day (QID) | ORAL | Status: DC | PRN
  Administered 2023-01-29: 650 mg via ORAL
  Filled 2023-01-29: qty 2

## 2023-01-29 MED ORDER — APIXABAN 5 MG PO TABS: 5.0000 mg | ORAL_TABLET | Freq: Two times a day (BID) | ORAL | Status: DC

## 2023-01-29 MED ORDER — ACETAMINOPHEN 650 MG RE SUPP: 650.0000 mg | Freq: Four times a day (QID) | RECTAL | Status: DC | PRN

## 2023-01-29 MED ORDER — HEPARIN (PORCINE) 25000 UT/250ML-% IV SOLN
1300.0000 [IU]/h | INTRAVENOUS | Status: DC
  Administered 2023-01-29: 1300 [IU]/h via INTRAVENOUS
  Filled 2023-01-29: qty 250

## 2023-01-29 MED ORDER — APIXABAN 5 MG PO TABS: 10.0000 mg | ORAL_TABLET | Freq: Two times a day (BID) | ORAL | Status: DC

## 2023-01-29 MED ORDER — IOHEXOL 350 MG/ML SOLN
75.0000 mL | Freq: Once | INTRAVENOUS | Status: AC | PRN
  Administered 2023-01-29: 75 mL via INTRAVENOUS

## 2023-01-29 MED ORDER — ORAL CARE MOUTH RINSE: 15.0000 mL | OROMUCOSAL | Status: DC | PRN

## 2023-01-29 MED ORDER — APIXABAN (ELIQUIS) VTE STARTER PACK (10MG AND 5MG): ORAL_TABLET | ORAL | 0 refills | Status: DC

## 2023-01-29 MED ORDER — ALBUTEROL SULFATE HFA 108 (90 BASE) MCG/ACT IN AERS: 1.0000 | INHALATION_SPRAY | Freq: Four times a day (QID) | RESPIRATORY_TRACT | Status: DC | PRN

## 2023-01-29 MED ORDER — ALBUTEROL SULFATE (2.5 MG/3ML) 0.083% IN NEBU: 2.5000 mg | INHALATION_SOLUTION | Freq: Four times a day (QID) | RESPIRATORY_TRACT | Status: DC | PRN

## 2023-01-29 MED ORDER — SENNOSIDES-DOCUSATE SODIUM 8.6-50 MG PO TABS: 1.0000 | ORAL_TABLET | Freq: Every evening | ORAL | Status: DC | PRN

## 2023-01-29 MED ORDER — HEPARIN BOLUS VIA INFUSION
3000.0000 [IU] | Freq: Once | INTRAVENOUS | Status: AC
  Administered 2023-01-29: 3000 [IU] via INTRAVENOUS
  Filled 2023-01-29: qty 3000

## 2023-01-29 NOTE — ED Provider Notes (Signed)
Received handoff from Janene Madeira, pending CTA. Has had PE on eliquis for DVT. Here for R calf pain. +DVT---vascular consulted, reports [place] vein thus need Eliquis increased twice/day. Pt has only been taking Eliquis once/day. F/u with IM+hem/onc. Pharmacy has already been consulted.   Physical Exam  BP 111/86   Pulse 76   Temp 97.8 F (36.6 C) (Oral)   Resp 16   Ht 5\' 4"  (1.626 m)   Wt 104.3 kg   SpO2 100%   BMI 39.48 kg/m   Physical Exam Vitals and nursing note reviewed.  Constitutional:      General: She is not in acute distress.    Appearance: She is well-developed.  HENT:     Head: Normocephalic and atraumatic.  Eyes:     Conjunctiva/sclera: Conjunctivae normal.  Cardiovascular:     Rate and Rhythm: Normal rate and regular rhythm.     Heart sounds: No murmur heard. Pulmonary:     Effort: Pulmonary effort is normal. No respiratory distress.     Breath sounds: Normal breath sounds.  Abdominal:     Palpations: Abdomen is soft.     Tenderness: There is no abdominal tenderness.  Musculoskeletal:        General: No swelling.     Cervical back: Neck supple.  Skin:    General: Skin is warm and dry.     Capillary Refill: Capillary refill takes less than 2 seconds.  Neurological:     Mental Status: She is alert.  Psychiatric:        Mood and Affect: Mood normal.     Procedures  Procedures  ED Course / MDM    Medical Decision Making Patient is a 52 year old female, history of DVT, PE, on Eliquis, who presents to the ED secondary to shortness of breath, leg swelling/cramping.  Found to have a DVT, CTA positive for PE, and inferior and superior segmental arteries.  I discussed thoroughly, with Dr. Donnald Garre, previous attending to kindly, vascular has already been consulted and they said if negative CTA to discharge home.  However given that she has recurrent PEs, is noncompliant with Eliquis, and has both a DVT and PE, we will admit her to hospitalist for heparin.  Likely have  a vascular consult inpatient for possible IVC filter, and hematology consult as well.  Admitted to internal medicine for further vascular consult inpatient, hematology consult, and heparin.  Amount and/or Complexity of Data Reviewed Labs: ordered. Radiology: ordered.  Risk Prescription drug management. Decision regarding hospitalization.        Pete Pelt, [place] 01/29/23 1605    Terald Sleeper, MD 01/30/23 [phone]

## 2023-01-29 NOTE — Progress Notes (Signed)
ANTICOAGULATION CONSULT NOTE - Initial Consult  Pharmacy Consult for heparin Indication: pulmonary embolus  Allergies  Allergen Reactions   Hydrocodone Other (See Comments)    Chest pains    Patient Measurements: Height: 5\' 4"  (162.6 cm) Weight: 104.3 kg (230 lb) IBW/kg (Calculated) : 54.7 Heparin Dosing Weight: 79.2 kg  Vital Signs: Temp: 97.9 F (36.6 C) (06/17 1515) Temp Source: Oral (06/17 1515) BP: 119/84 (06/17 1515) Pulse Rate: 65 (06/17 1515)  Labs: Recent Labs    01/29/23 1050 01/29/23 1058  HGB 8.9* 10.2*  HCT 29.6* 30.0*  PLT 312  --   CREATININE 0.97 1.00    Estimated Creatinine Clearance: 78.3 mL/min (by C-G formula based on SCr of 1 mg/dL).   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 Pharmacy consulted to dose heparin for PE/DVT. CTA showed PE and DVT in inferior and superior segmental arteries and based on patient's fill history and patient stating only taking it once daily, shows non-adherence with her Eliquis. Patient states her last dose was 6/16 PM.  Hgb 8.9 and plts 312  Goal of Therapy:  Heparin level 0.3-0.7 units/ml aPTT 66-102 seconds Monitor platelets by anticoagulation protocol: Yes   Plan:  Give 3000 units bolus x 1 (half bolus) Start heparin infusion at 1300 units/hr Check anti-Xa and aPTT level in  6 hours and daily while on heparin until levels correlate Continue to monitor H&H and platelets  Thanks,  Arabella Merles, PharmD. [facility] Acute Care PGY-1 01/29/2023 4:40 PM

## 2023-01-29 NOTE — Progress Notes (Signed)
ANTICOAGULATION CONSULT NOTE - Initial Consult  Pharmacy Consult for Eliquis  Indication: DVT  Allergies  Allergen Reactions   Hydrocodone Other (See Comments)    Chest pains    Patient Measurements: Height: 5\' 4"  (162.6 cm) Weight: 104.3 kg (230 lb) IBW/kg (Calculated) : 54.7  Vital Signs: Temp: 97.8 F (36.6 C) (06/17 1130) Temp Source: Oral (06/17 1130) BP: 104/77 (06/17 1130) Pulse Rate: 62 (06/17 1130)  Labs: Recent Labs    01/29/23 1050 01/29/23 1058  HGB 8.9* 10.2*  HCT 29.6* 30.0*  PLT 312  --   CREATININE 0.97 1.00    Estimated Creatinine Clearance: 78.3 mL/min (by C-G formula based on SCr of 1 mg/dL).   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 Patient last DVT prior to this visit was in 03/2022, where patient had several missed doses which lead to PE. 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 Patient prescribed Eliquis 5mg  BID however after discussion with patient she has been taking once daily. CTA also positive for PE currently on room air.   Pharmacy consulted for Eliquis dosing.    Plan:  Discussed with PA, hold off on Eliquis for now.  Discussing potential admission and/or consulting specialist.   Estill Batten, PharmD, BCCCP  01/29/2023,1:38 PM

## 2023-01-29 NOTE — H&P (Signed)
Date: 01/29/2023               Patient Name:  Yesenia Wheeler MRN: [id number]  DOB: Oct 25, 1970 Age / Sex: 52 y.o., female   PCP: System, Provider Not In         Medical Service: Internal Medicine Teaching Service         Attending Physician: Dr. Mercie Eon, MD    First Contact: Dr. Rana Snare, DO Pager: [phone]  Second Contact: Dr. Champ Mungo, DO Pager: [phone]       After Hours (After 5p/  First Contact Pager: [phone]  weekends / holidays): Second Contact Pager: [phone]   Chief Complaint: left leg pain  History of Present Illness:  Yesenia Wheeler is a 52 y.o. 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   She explains that about 2 days ago she noticed pain increased from baseline. She has an extensive history of recurrent DVTs in the RLE and is familiar with the signs of DVT which made her worried for recurrence. She has not had increased swelling of the LE and denies dyspnea at rest. She did have some dyspnea when ambulating to the restroom today in addition to mild chest pain which has resolved. She denies pain in the LLE.  She has been on eliquis for several years now but only takes it once per day. She first started having DVTs during pregnancy and heme/onc completed an evaluation and started the medication. She infrequently misses doses and when she realizes that she missed a day, will sometimes take 2. She denies recent travel, illness, surgeries. She is not on any medication with hormones in it. She is not up to date on cancer screenings. Her last DVT was reportedly in the last 1 year.  Of note patient does state that her last menstrual cycle ended 06/14 and was heavier than normal. She states that she went through a pad every ~1 hour.   She did recently go to urgent  care due to leg swelling and was told that she has CHF and given a short course of lasix.  ED Course: Labs overall unremarkable. US showed acute DVT or peroneal veins in RLE. CTA chest showed acute segmental PE of R pulmonary arteries. She received a heparin bolus and was started on heparin infusion. IMTS consulted for admission.  Meds:  Eliquis 5 mg daily (prescribed as BID) Lasix 40 mg daily Albuterol inhaler prn  Allergies: Allergies as of 01/29/2023 - Reviewed 01/29/2023  Allergen Reaction Noted   Hydrocodone Other (See Comments) 05/07/2018   PMH: Iron deficiency anemia Recurrent DVTs L breast mass, benign Migraines Asthma OA CHF  PSH: Past Surgical History:  Procedure Laterality Date   BREAST LUMPECTOMY WITH RADIOACTIVE SEED LOCALIZATION Left 10/23/2017   Procedure: BREAST LUMPECTOMY WITH RADIOACTIVE SEED LOCALIZATION;  Surgeon: Harriette Bouillon, MD;  Location: [facility];  Service: General;  Laterality: Left;   BREAST REDUCTION SURGERY Bilateral 10/23/2017   Procedure: LEFT BREAST ONCOPLASTIC REDUCTION, RIGHT BREAST REDUCTION;  Surgeon: Glenna Fellows, MD;  Location:  SURGERY CENTER;  Service: Plastics;  Laterality: Bilateral;   CESAREAN SECTION  2011   GASTRIC BYPASS  2001   KNEE ARTHROSCOPY W/ MENISCECTOMY Right 2007   KNEE ARTHROSCOPY WITH MEDIAL MENISECTOMY Left 08/30/2017  Procedure: KNEE ARTHROSCOPY WITH PARTIAL MEDIAL MENISECTOMY;  Surgeon: Yolonda Kida, MD;  Location: [facility];  Service: Orthopedics;  Laterality: Left;  60 mins   LAPAROSCOPIC CHOLECYSTECTOMY  2001   SHOULDER ARTHROSCOPY Left 2008   spur   Family History:  Family History  Problem Relation Age of Onset   Cancer Father        oral   Social History: Patient lives in [place] with her boyfriend. She is a Engineer, civil (consulting). She is independent in ADLs and IADLs. IMTS is PCP. Drinks a glass of wine occasionally, denies illicit substance use. Quit smoking  tobacco 5 years ago; previously smoked 1 pack per 2-3 days for about 10 years.   Review of Systems: A complete ROS was negative except as per HPI.   Physical Exam: Blood pressure 119/84, pulse 65, temperature 97.9 F (36.6 C), temperature source Oral, resp. rate 15, height 5\' 4"  (1.626 m), weight 104.3 kg, SpO2 100 %. Constitutional: alert, sitting up in bed comfortably, in no acute distress Cardiovascular: regular rate and rhythm, no LE edema Pulmonary/Chest: normal work of breathing on room air, lungs clear to auscultation bilaterally, no wheezing or crackles  Abdominal: bowel sounds present, soft, non-tender, non-distended MSK: normal bulk and tone, TTP of R calf, no LE edema Neurological: alert & oriented x 3 Skin: warm and dry Psych: pleasant mood  EKG: personally reviewed my interpretation is NSR.  CTA Chest:  1. Examination is POSITIVE for segmental emboli within the RIGHT superior and inferior pulmonary arteries. No CT evidence of RIGHT heart strain, with RV/LV ratio of 1.0. 2. 2.5 cm RIGHT thyroid nodule. Recommend non-emergent, outpatient thyroid ultrasound if not previously performed.  Assessment & Plan by Problem: Principal Problem:   Pulmonary embolism (HCC)  Acute PE Acute DVT Hx recurrent DVT Hx mildly decreased protein C and S function Patient with history of recurrent DVT on eliquis but not fully compliant presented due to recurrent leg pain ultimately found to have R segmental PE to R superior and inferior pulmonary arteries without imaging evidence of R heart strain. Patient states that she thought her eliquis dosing was once daily. She does occasionally miss her doses, less than 1 time per week. Further complicated by patient report of heavier menstrual cycle recently, specifically that she bled through a pad in an hour throughout the cycle. Fortunately she is hemodynamically stable with no symptoms at rest, and minimal symptoms with exertion. Last evaluated in  03/2021 by heme/onc whose note did not seem that the mild decline in protein C and S function was clinically significant. She was not felt to have eliquis failure at that time. Patient does explain that when initially told several years ago that she would require lifelong anticoagulation she says that she did not take this news well; I am concerned that despite recommendations for several years to take eliquis BID, that she will struggle to be fully compliant. Hem/onc recommendations previously included lovenox and warfarin if she had eliquis failure.  -S/p heparin bolus, now on infusion -Consider discussion with heme/onc, changing to xarelto for increased compliance -Consult VVS in AM for evaluation for placement of IVC filter in setting of anemia and subjectively heavy bleeding  Congestive heart failure Patient was reportedly diagnosed with CHF in the last several months. She was prescribed lasix for volume control but has been out of this medication for 2 weeks. Not clinically overloaded on exam today. No recent echocardiogram that I am able to see in her chart. -Will  not resume home lasix given low suspicion that dyspnea is related to CHF and clinical euvolemia -Recommend outpatient echocardiogram if not previously done  Hx iron deficiency anemia Prescribed iron supplementation OP but does not take this. Hgb 8.9 on admission, relatively stable from prior. MCV 74.0. She is still menstruating and describes a very heavy cycle recently where she was bleeding through a pad in an hour. This menstrual cycle ended 06/14. -Trend CBC, transfuse for Hgb <7 -Iron studies  Hx asthma Has albuterol inhaler PRN as outpatient. Did endorse mild dyspnea on exertion today when ambulating to the restroom. No wheezing/signs of exacerbation on physical exam. -Albuterol inhaler PRN wheezing  Incidental thyroid nodule Noted on CTA chest, 2.5 cm nodule in R lobe. CT soft tissue of the neck in 01/2021 notes thyroid as  appearing normal so this is new over the last 2 years.  -Recommend OP [place] follow-up.  Dispo: Admit patient to Observation with expected length of stay less than 2 midnights.  Signed: Rana Snare, DO 01/29/2023, 6:42 PM  After 5pm on weekdays and 1pm on weekends: On Call pager: [phone]

## 2023-01-29 NOTE — Progress Notes (Signed)
VASCULAR LAB   Right lower extremity venous duplex has been performed.  See CV proc for preliminary results.  Messaged results to Jeanelle Malling, PA-C vis secure chat  Sherren Kerns, RVT 01/29/2023, 9:35 AM

## 2023-01-29 NOTE — ED Notes (Signed)
ED TO INPATIENT HANDOFF REPORT  ED Nurse Name and Phone #: Nehemiah Settle [phone]  S Name/Age/Gender Yesenia Wheeler 52 y.o. female Room/Bed: 029C/029C  Code Status   Code Status: DNR  Home/SNF/Other Home Patient oriented to: self, place, time, and situation Is this baseline? Yes   Triage Complete: Triage complete  Chief Complaint Pulmonary embolism (HCC) [I26.99]  Triage Note C/o right calf pain onset 2 days ago states the pain is worse today . States it hurts worse to walk. Positive right pedal pulse    Allergies Allergies  Allergen Reactions   Hydrocodone Other (See Comments)    Chest pains    Level of Care/Admitting Diagnosis ED Disposition     ED Disposition  Admit   Condition  --   Comment  Hospital Area: [facility] [[id number]]  Level of Care: Telemetry Medical [104]  May place patient in observation at [facility] or [facility] if equivalent level of care is available:: No  Covid Evaluation: Asymptomatic - no recent exposure (last 10 days) testing not required  Diagnosis: Pulmonary embolism [facility]) [[id number]]  Admitting Physician: Mercie Eon [[id number]]  Attending Physician: Mercie Eon [[id number]]          B Medical/Surgery History 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 , complex sclerosing lesion   Migraines    Mild asthma    OA (osteoarthritis)    bilateral knees   [place]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    SBO (small bowel obstruction) (HCC) 09/2020   Past Surgical History:  Procedure Laterality Date   BREAST LUMPECTOMY WITH RADIOACTIVE SEED LOCALIZATION Left 10/23/2017   Procedure: BREAST LUMPECTOMY WITH RADIOACTIVE SEED LOCALIZATION;  Surgeon: Harriette Bouillon, MD;  Location: [facility];  Service: General;  Laterality: Left;   BREAST REDUCTION SURGERY Bilateral 10/23/2017   Procedure: LEFT BREAST ONCOPLASTIC REDUCTION, RIGHT BREAST REDUCTION;  Surgeon: Glenna Fellows, MD;  Location: [facility];  Service: Plastics;  Laterality: Bilateral;   CESAREAN SECTION  2011   GASTRIC BYPASS  2001   KNEE ARTHROSCOPY W/ MENISCECTOMY Right 2007   KNEE ARTHROSCOPY WITH MEDIAL MENISECTOMY Left 08/30/2017   Procedure: KNEE ARTHROSCOPY WITH PARTIAL MEDIAL MENISECTOMY;  Surgeon: Yolonda Kida, MD;  Location: [facility];  Service: Orthopedics;  Laterality: Left;  60 mins   LAPAROSCOPIC CHOLECYSTECTOMY  2001   SHOULDER ARTHROSCOPY Left 2008   spur     A IV Location/Drains/Wounds Patient Lines/Drains/Airways Status     Active Line/Drains/Airways     Name Placement date Placement time Site Days   Peripheral IV 01/29/23 20 G 1.88" Left Antecubital 01/29/23  1425  Antecubital  less than 1   Closed System Drain 1 Right Breast Bulb (JP) 15 Fr. 10/23/17  1149  Breast  1924   Closed System Drain 2 Left Breast Bulb (JP) 15 Fr. 10/23/17  1243  Breast  1924            Intake/Output Last 24 hours No intake or output data in the 24 hours ending 01/29/23 1736  Labs/Imaging Results for orders placed or performed during the hospital encounter of 01/29/23 (from the past 48 hour(s))  Brain natriuretic peptide  Status: None   Collection Time: 01/29/23 10:38 AM  Result Value Ref Range   B Natriuretic Peptide 26.7 0.0 - 100.0 pg/mL    Comment: Performed at [facility], [address] [place], [place] [address]  Comprehensive metabolic panel     Status: Abnormal   Collection Time: 01/29/23 10:50 AM  Result Value Ref Range   Sodium 137 135 - 145 mmol/L   Potassium 3.6 3.5 - 5.1 mmol/L   Chloride 106 98 - 111 mmol/L   CO2 22 22 - 32 mmol/L   Glucose, Bld 91 70 - 99 mg/dL    Comment: Glucose reference range applies only to samples taken  after fasting for at least 8 hours.   BUN 6 6 - 20 mg/dL   Creatinine, Ser 6.04 0.44 - 1.00 mg/dL   Calcium 8.4 (L) 8.9 - 10.3 mg/dL   Total Protein 6.5 6.5 - 8.1 g/dL   Albumin 3.1 (L) 3.5 - 5.0 g/dL   AST 19 15 - 41 U/L   ALT 11 0 - 44 U/L   Alkaline Phosphatase 101 38 - 126 U/L   Total Bilirubin 0.6 0.3 - 1.2 mg/dL   GFR, Estimated >54 >09 mL/min    Comment: (NOTE) Calculated using the CKD-EPI Creatinine Equation (2021)    Anion gap 9 5 - 15    Comment: Performed at [facility], [address], [place], [place] [address]  CBC with Differential     Status: Abnormal   Collection Time: 01/29/23 10:50 AM  Result Value Ref Range   WBC 5.8 4.0 - 10.5 K/uL   RBC 4.00 3.87 - 5.11 MIL/uL   Hemoglobin 8.9 (L) 12.0 - 15.0 g/dL    Comment: Reticulocyte Hemoglobin testing may be clinically indicated, consider ordering this additional test [id number]    HCT 29.6 (L) 36.0 - 46.0 %   MCV 74.0 (L) 80.0 - 100.0 fL   MCH 22.3 (L) 26.0 - 34.0 pg   MCHC 30.1 30.0 - 36.0 g/dL   RDW 13.0 (H) 86.5 - 78.4 %   Platelets 312 150 - 400 K/uL   nRBC 0.0 0.0 - 0.2 %   Neutrophils Relative % 64 %   Neutro Abs 3.7 1.7 - 7.7 K/uL   Lymphocytes Relative 28 %   Lymphs Abs 1.7 0.7 - 4.0 K/uL   Monocytes Relative 5 %   Monocytes Absolute 0.3 0.1 - 1.0 K/uL   Eosinophils Relative 2 %   Eosinophils Absolute 0.1 0.0 - 0.5 K/uL   Basophils Relative 1 %   Basophils Absolute 0.0 0.0 - 0.1 K/uL   Immature Granulocytes 0 %   Abs Immature Granulocytes 0.01 0.00 - 0.07 K/uL    Comment: Performed at [facility], [address], [place], [place] [address]  I-Stat beta hCG blood, ED     Status: None   Collection Time: 01/29/23 10:55 AM  Result Value Ref Range   I-stat hCG, quantitative <5.0 <5 mIU/mL   Comment 3            Comment:   GEST. AGE      CONC.  (mIU/mL)   <=1 WEEK        5 - 50     2 WEEKS       50 - 500     3 WEEKS       100 - 10,000     4 WEEKS     1,000 - 30,000  FEMALE AND  NON-PREGNANT FEMALE:     LESS THAN 5 mIU/mL   I-stat chem 8, ED (not at [facility], [facility] or [facility])     Status: Abnormal   Collection Time: 01/29/23 10:58 AM  Result Value Ref Range   Sodium 140 135 - 145 mmol/L   Potassium 3.7 3.5 - 5.1 mmol/L   Chloride 106 98 - 111 mmol/L   BUN 6 6 - 20 mg/dL   Creatinine, Ser 1.61 0.44 - 1.00 mg/dL   Glucose, Bld 89 70 - 99 mg/dL    Comment: Glucose reference range applies only to samples taken after fasting for at least 8 hours.   Calcium, Ion 1.20 1.15 - 1.40 mmol/L   TCO2 24 22 - 32 mmol/L   Hemoglobin 10.2 (L) 12.0 - 15.0 g/dL   HCT 09.6 (L) 04.5 - 40.9 %   CT Angio Chest PE W and/or Wo Contrast  Result Date: 01/29/2023 CLINICAL DATA:  Pulmonary embolism (PE) suspected, high prob shortness of breath EXAM: CT ANGIOGRAPHY CHEST WITH CONTRAST TECHNIQUE: Multidetector CT imaging of the chest was performed using the standard protocol during bolus administration of intravenous contrast. Multiplanar CT image reconstructions and MIPs were obtained to evaluate the vascular anatomy. RADIATION DOSE REDUCTION: This exam was performed according to the departmental dose-optimization program which includes automated exposure control, adjustment of the mA and/or kV according to patient size and/or use of iterative reconstruction technique. CONTRAST:  75mL OMNIPAQUE IOHEXOL 350 MG/ML SOLN COMPARISON:  Chest XR, 01/12/2023.  CTA chest, 03/21/2019. FINDINGS: Cardiovascular: Satisfactory opacification of the pulmonary arteries to the segmental level. Pulmonary emboli, as seen at the RIGHT pulmonary arterial bifurcation and involving the inferior and superior segmental pulmonary arteries. Normal heart size. No pericardial effusion.  RV/LV ratio 1.0 Mediastinum/Nodes: No enlarged mediastinal, hilar, or axillary lymph nodes. 2.5 cm RIGHT thyroid nodule. trachea, and esophagus demonstrate no significant findings. Lungs/Pleura: Partially-calcified granuloma of the RIGHT lung base. Lungs  are clear without focal consolidation, mass or suspicious pulmonary nodule. No pleural effusion or pneumothorax. Upper Abdomen: No acute abnormality. Gastric bypass. Cholecystectomy clips. Musculoskeletal: No acute chest wall abnormality. No acute or significant osseous findings. Review of the MIP images confirms the above findings. IMPRESSION: 1. Examination is POSITIVE for segmental emboli within the RIGHT superior and inferior pulmonary arteries. No CT evidence of RIGHT heart strain, with RV/LV ratio of 1.0. Please refer to the "Code PE Focused" order set in EPIC. 2. 2.5 cm RIGHT thyroid nodule. Recommend non-emergent, outpatient thyroid ultrasound if not previously performed Reference: J Am Coll Radiol. 2015 Feb;12(2): 143-50 These results will be called to the ordering clinician or representative by the Radiologist Assistant, and communication documented in the PACS or [employer]. Electronically Signed   By: Roanna Banning M.D.   On: 01/29/2023 15:11   VAS [place] LOWER EXTREMITY VENOUS (DVT) (ONLY [facility] & [facility])  Result Date: 01/29/2023  Lower Venous DVT Study Patient Name:  Yesenia Wheeler  Date of Exam:   01/29/2023 Medical Rec #: [id number]           Accession #:    [id number] Date of Birth: Oct 03, 1970           Patient Gender: F Patient Age:   81 years Exam Location:  [facility] Procedure:      VAS [place] LOWER EXTREMITY VENOUS (DVT) Referring Phys: KEN LE --------------------------------------------------------------------------------  Indications: New calf pain.  Risk Factors: History of DVT, on Eliquis. Anticoagulation: Eliquis. Comparison Study: Prior study done 03/11/20 indicated  age indeterminate DVT in                   the right soleal. Performing Technologist: Sherren Kerns RVS  Examination Guidelines: A complete evaluation includes B-mode imaging, spectral Doppler, color Doppler, and power Doppler as needed of all accessible portions of each vessel. Bilateral testing is considered an integral  part of a complete examination. Limited examinations for reoccurring indications may be performed as noted. The reflux portion of the exam is performed with the patient in reverse Trendelenburg.  +---------+---------------+---------+-----------+----------+-----------------+ RIGHT    CompressibilityPhasicitySpontaneityPropertiesThrombus Aging    +---------+---------------+---------+-----------+----------+-----------------+ CFV      Full           Yes      Yes                                    +---------+---------------+---------+-----------+----------+-----------------+ SFJ      Full                                                           +---------+---------------+---------+-----------+----------+-----------------+ FV Prox  Full                                                           +---------+---------------+---------+-----------+----------+-----------------+ FV Mid   Partial                                      Chronic           +---------+---------------+---------+-----------+----------+-----------------+ FV DistalPartial                                      Chronic           +---------+---------------+---------+-----------+----------+-----------------+ PFV      Full                                                           +---------+---------------+---------+-----------+----------+-----------------+ POP      Partial        No       No                   Age Indeterminate +---------+---------------+---------+-----------+----------+-----------------+ PTV      Full                                                           +---------+---------------+---------+-----------+----------+-----------------+ PERO     None  Acute             +---------+---------------+---------+-----------+----------+-----------------+   +----+---------------+---------+-----------+----------+--------------+  LEFTCompressibilityPhasicitySpontaneityPropertiesThrombus Aging +----+---------------+---------+-----------+----------+--------------+ CFV Full           Yes      Yes                                 +----+---------------+---------+-----------+----------+--------------+    Summary: RIGHT: - Findings consistent with acute deep vein thrombosis involving the right peroneal veins. - Findings consistent with age indeterminate deep vein thrombosis involving the right popliteal vein. - Findings consistent with chronic deep vein thrombosis involving the right femoral vein.  LEFT: - No evidence of common femoral vein obstruction.  *See table(s) above for measurements and observations. Electronically signed by Sherald Hess MD on 01/29/2023 at 10:46:58 AM.    Final     Pending Labs Unresulted Labs (From admission, onward)     Start     Ordered   01/30/23 0500  CBC  Daily,   R      01/29/23 1642   01/30/23 0500  HIV Antibody (routine testing w rflx)  (HIV Antibody (Routine testing w reflex) panel)  Tomorrow morning,   R        01/29/23 1720   01/30/23 0500  Basic metabolic panel  Tomorrow morning,   R        01/29/23 1720   01/29/23 2300  APTT  Once-Timed,   STAT        01/29/23 1642   01/29/23 2300  Heparin level (unfractionated)  Once-Timed,   URGENT        01/29/23 1642            Vitals/Pain Today's Vitals   01/29/23 1023 01/29/23 1130 01/29/23 1400 01/29/23 1515  BP:  104/77 111/86 119/84  Pulse:  62 76 65  Resp:  17 16 15   Temp:  97.8 F (36.6 C)  97.9 F (36.6 C)  TempSrc:  Oral  Oral  SpO2:  100% 100% 100%  Weight:      Height:      PainSc: 5    0-No pain    Isolation Precautions No active isolations  Medications Medications  heparin ADULT infusion 100 units/mL (25000 units/214mL) (1,300 Units/hr Intravenous New Bag/Given 01/29/23 1731)  acetaminophen (TYLENOL) tablet 650 mg (has no administration in time range)    Or  acetaminophen (TYLENOL) suppository 650  mg (has no administration in time range)  senna-docusate (Senokot-S) tablet 1 tablet (has no administration in time range)  iohexol (OMNIPAQUE) 350 MG/ML injection 75 mL (75 mLs Intravenous Contrast Given 01/29/23 1440)  heparin bolus via infusion 3,000 Units (3,000 Units Intravenous Bolus from Bag 01/29/23 1730)    Mobility walks     Focused Assessments    R Recommendations: See Admitting Provider Note  Report given to:   Additional Notes:  20g L.AC, heparin initiated. Pt a&ox4,vss,nad.

## 2023-01-29 NOTE — ED Notes (Signed)
IV team at bedside to place new IV

## 2023-01-29 NOTE — ED Notes (Signed)
Pt transported to CT ?

## 2023-01-29 NOTE — Hospital Course (Addendum)
Makiya Beamesderfer is a 52 y.o. 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 Patient was taking eliquis only once daily. She was hemodynamically stable with no symptoms at rest, and minimal symptoms with exertion. She received a heparin bolus and was started on heparin infusion. Declined transition to Xarelto due to inconsistent eating habits. She was discharged on Eliquis (starter pack) following counseling on her medical regimen and importance of correct adherence to the medication dosing.   Congestive heart failure Reported recent diagnosis of CHF at urgent care and given short course of Lasix. She was clinically euvolemic. Recommend outpatient echocardiogram for further assessment if concern for CHF remains.   Hx iron deficiency anemia Hgb on admission 8.9, MCV 74.0. Iron studies revealed iron 12, TIBC 456, saturation 3%, ferritin 3. Calculated iron deficit of 1276. She received one dose of Ferrlecit prior to discharge and was discharged with iron supplementation.    Hx asthma Home PRN albuterol inhaler continued for admission. No signs of asthma exacerbation.    Incidental thyroid nodule Noted on CTA chest, 2.5 cm nodule in R lobe. CT soft tissue of the neck in 01/2021 notes thyroid as appearing normal so this is new over the last 2 years. Recommend OP [place] follow-up.

## 2023-01-29 NOTE — ED Triage Notes (Signed)
C/o right calf pain onset 2 days ago states the pain is worse today . States it hurts worse to walk. Positive right pedal pulse

## 2023-01-29 NOTE — ED Notes (Signed)
Pt transported to US

## 2023-01-29 NOTE — ED Notes (Signed)
CT notified of new IV placement and pt ready for imaging.

## 2023-01-29 NOTE — ED Notes (Signed)
Pt transported to assigned unit with RN on monitor and remains on heparin gtt. Pt a&ox4,VSS,NAD.

## 2023-01-29 NOTE — ED Provider Notes (Signed)
[place] EMERGENCY DEPARTMENT AT [facility] Provider Note   CSN: [id number] Arrival date & time: 01/29/23  0865     History  Chief Complaint  Patient presents with   Leg Pain    Yesenia Wheeler is a 52 y.o. female past with history of DVT on Eliquis, migraines, SBO, asthma presents today for evaluation of leg pain.  States she has had right lower leg pain for 2 days.  States she has swelling on both legs, right more than left.  States she was recently diagnosed with congestive heart failure and has been placed on Lasix.   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    SBO (small bowel obstruction) (HCC) 09/2020   Past Surgical History:  Procedure Laterality Date   BREAST LUMPECTOMY WITH RADIOACTIVE SEED LOCALIZATION Left 10/23/2017   Procedure: BREAST LUMPECTOMY WITH RADIOACTIVE SEED LOCALIZATION;  Surgeon: Harriette Bouillon, MD;  Location: [facility];  Service: General;  Laterality: Left;   BREAST REDUCTION SURGERY Bilateral 10/23/2017   Procedure: LEFT BREAST ONCOPLASTIC REDUCTION, RIGHT BREAST REDUCTION;  Surgeon: Glenna Fellows, MD;  Location: [facility];  Service: Plastics;  Laterality: Bilateral;   CESAREAN SECTION  2011   GASTRIC BYPASS  2001   KNEE ARTHROSCOPY W/ MENISCECTOMY Right 2007   KNEE ARTHROSCOPY WITH MEDIAL MENISECTOMY Left 08/30/2017   Procedure: KNEE ARTHROSCOPY WITH PARTIAL MEDIAL MENISECTOMY;  Surgeon: Yolonda Kida, MD;  Location: [facility];  Service: Orthopedics;  Laterality: Left;  60 mins    LAPAROSCOPIC CHOLECYSTECTOMY  2001   SHOULDER ARTHROSCOPY Left 2008   spur     Home Medications Prior to Admission medications   Medication Sig Start Date End Date Taking? Authorizing Provider  albuterol (VENTOLIN HFA) 108 (90 Base) MCG/ACT inhaler Inhale into the lungs every 6 (six) hours as needed for wheezing or shortness of breath.    [provider]  APIXABAN Everlene Balls) VTE STARTER PACK (10MG  AND 5MG ) Take as directed on package: start with two-5mg  tablets twice daily for 7 days. On day 8, switch to one-5mg  tablet twice daily. 03/20/22   Rancour, Jeannett Senior, MD  DULoxetine (CYMBALTA) 30 MG capsule Take 1 capsule (30 mg total) by mouth daily. 10/28/21 04/26/22  Adron Bene, MD  ELIQUIS 5 MG TABS tablet TAKE 1 TABLET BY MOUTH TWICE A DAY 11/24/21   Rehman, Areeg N, DO  ferrous sulfate 325 (65 FE) MG EC tablet TAKE 1 TABLET BY MOUTH EVERY DAY WITH BREAKFAST 03/27/22   Steffanie Rainwater, MD  furosemide (LASIX) 40 MG tablet Take 1 tablet (40 mg total) by mouth daily. 01/10/23   Rafoth, Baldemar Friday, MD  gabapentin (NEURONTIN) 300 MG capsule 1 capsule in the morning and midday, 2 at night 03/15/21   York Spaniel, MD  meclizine (ANTIVERT) 25 MG tablet Take 1 tablet (25 mg total) by mouth 3 (three) times daily as needed for dizziness. 05/19/21   Charlynne Pander, MD  metroNIDAZOLE (FLAGYL) 500  MG tablet Take 1 tablet (500 mg total) by mouth 2 (two) times daily. 02/17/22   Merrilee Jansky, MD  omeprazole (PRILOSEC) 20 MG capsule Take 1 capsule (20 mg total) by mouth daily. 03/20/22   Rancour, Jeannett Senior, MD  enoxaparin (LOVENOX) 40 MG/0.4ML injection Inject 0.4 mLs (40 mg total) into the skin daily. Start 3.14.19 Patient not taking: Reported on 04/30/2018 10/24/17 03/16/20  Glenna Fellows, MD      Allergies    Hydrocodone    Review of Systems   Review of Systems Negative except as per HPI.  Physical Exam Updated Vital Signs BP 119/84 (BP Location: Right Arm)   Pulse 65   Temp 97.9 F  (36.6 C) (Oral)   Resp 15   Ht 5\' 4"  (1.626 m)   Wt 104.3 kg   SpO2 100%   BMI 39.48 kg/m  Physical Exam Vitals and nursing note reviewed.  Constitutional:      Appearance: Normal appearance.  HENT:     Head: Normocephalic and atraumatic.     Mouth/Throat:     Mouth: Mucous membranes are moist.  Eyes:     General: No scleral icterus. Cardiovascular:     Rate and Rhythm: Normal rate and regular rhythm.     Pulses: Normal pulses.     Heart sounds: Normal heart sounds.  Pulmonary:     Effort: Pulmonary effort is normal.     Breath sounds: Normal breath sounds.  Abdominal:     General: Abdomen is flat.     Palpations: Abdomen is soft.     Tenderness: There is no abdominal tenderness.  Musculoskeletal:        General: No deformity.     Comments: Tenderness to palpation to the right calf.  Skin:    General: Skin is warm.     Findings: No rash.  Neurological:     General: No focal deficit present.     Mental Status: She is alert.  Psychiatric:        Mood and Affect: Mood normal.     ED Results / Procedures / Treatments   Labs (all labs ordered are listed, but only abnormal results are displayed) Labs Reviewed  COMPREHENSIVE METABOLIC PANEL - Abnormal; Notable for the following components:      Result Value   Calcium 8.4 (*)    Albumin 3.1 (*)    All other components within normal limits  CBC WITH DIFFERENTIAL/PLATELET - Abnormal; Notable for the following components:   Hemoglobin 8.9 (*)    HCT 29.6 (*)    MCV 74.0 (*)    MCH 22.3 (*)    RDW 18.3 (*)    All other components within normal limits  I-STAT CHEM 8, ED - Abnormal; Notable for the following components:   Hemoglobin 10.2 (*)    HCT 30.0 (*)    All other components within normal limits  BRAIN NATRIURETIC PEPTIDE  I-STAT BETA HCG BLOOD, ED ([facility], [facility], [facility] ONLY)    EKG EKG Interpretation  Date/Time:  Monday January 29 2023 07:17:17 EDT Ventricular Rate:  74 PR Interval:  130 QRS Duration: 80 QT  Interval:  375 QTC Calculation: 416 R Axis:   42 Text Interpretation: Sinus rhythm Abnormal R-wave progression, early transition no sig change from previous Confirmed by Arby Barrette [phone]) on 01/29/2023 10:10:47 AM  Radiology CT Angio Chest PE W and/or Wo Contrast  Result Date: 01/29/2023 CLINICAL DATA:  Pulmonary embolism (PE) suspected, high prob shortness of breath EXAM: CT  ANGIOGRAPHY CHEST WITH CONTRAST TECHNIQUE: Multidetector CT imaging of the chest was performed using the standard protocol during bolus administration of intravenous contrast. Multiplanar CT image reconstructions and MIPs were obtained to evaluate the vascular anatomy. RADIATION DOSE REDUCTION: This exam was performed according to the departmental dose-optimization program which includes automated exposure control, adjustment of the mA and/or kV according to patient size and/or use of iterative reconstruction technique. CONTRAST:  75mL OMNIPAQUE IOHEXOL 350 MG/ML SOLN COMPARISON:  Chest XR, 01/12/2023.  CTA chest, 03/21/2019. FINDINGS: Cardiovascular: Satisfactory opacification of the pulmonary arteries to the segmental level. Pulmonary emboli, as seen at the RIGHT pulmonary arterial bifurcation and involving the inferior and superior segmental pulmonary arteries. Normal heart size. No pericardial effusion.  RV/LV ratio 1.0 Mediastinum/Nodes: No enlarged mediastinal, hilar, or axillary lymph nodes. 2.5 cm RIGHT thyroid nodule. trachea, and esophagus demonstrate no significant findings. Lungs/Pleura: Partially-calcified granuloma of the RIGHT lung base. Lungs are clear without focal consolidation, mass or suspicious pulmonary nodule. No pleural effusion or pneumothorax. Upper Abdomen: No acute abnormality. Gastric bypass. Cholecystectomy clips. Musculoskeletal: No acute chest wall abnormality. No acute or significant osseous findings. Review of the MIP images confirms the above findings. IMPRESSION: 1. Examination is POSITIVE for  segmental emboli within the RIGHT superior and inferior pulmonary arteries. No CT evidence of RIGHT heart strain, with RV/LV ratio of 1.0. Please refer to the "Code PE Focused" order set in EPIC. 2. 2.5 cm RIGHT thyroid nodule. Recommend non-emergent, outpatient thyroid ultrasound if not previously performed Reference: J Am Coll Radiol. 2015 Feb;12(2): 143-50 These results will be called to the ordering clinician or representative by the Radiologist Assistant, and communication documented in the PACS or [employer]. Electronically Signed   By: Roanna Banning M.D.   On: 01/29/2023 15:11   VAS [place] LOWER EXTREMITY VENOUS (DVT) (ONLY [facility] & [facility])  Result Date: 01/29/2023  Lower Venous DVT Study Patient Name:  Yesenia Wheeler  Date of Exam:   01/29/2023 Medical Rec #: [id number]           Accession #:    [id number] Date of Birth: 04/28/1971           Patient Gender: F Patient Age:   49 years Exam Location:  [facility] Procedure:      VAS [place] LOWER EXTREMITY VENOUS (DVT) Referring Phys: Hutson Luft --------------------------------------------------------------------------------  Indications: New calf pain.  Risk Factors: History of DVT, on Eliquis. Anticoagulation: Eliquis. Comparison Study: Prior study done 03/11/20 indicated age indeterminate DVT in                   the right soleal. Performing Technologist: Sherren Kerns RVS  Examination Guidelines: A complete evaluation includes B-mode imaging, spectral Doppler, color Doppler, and power Doppler as needed of all accessible portions of each vessel. Bilateral testing is considered an integral part of a complete examination. Limited examinations for reoccurring indications may be performed as noted. The reflux portion of the exam is performed with the patient in reverse Trendelenburg.  +---------+---------------+---------+-----------+----------+-----------------+ RIGHT    CompressibilityPhasicitySpontaneityPropertiesThrombus Aging     +---------+---------------+---------+-----------+----------+-----------------+ CFV      Full           Yes      Yes                                    +---------+---------------+---------+-----------+----------+-----------------+ SFJ      Full                                                           +---------+---------------+---------+-----------+----------+-----------------+  FV Prox  Full                                                           +---------+---------------+---------+-----------+----------+-----------------+ FV Mid   Partial                                      Chronic           +---------+---------------+---------+-----------+----------+-----------------+ FV DistalPartial                                      Chronic           +---------+---------------+---------+-----------+----------+-----------------+ PFV      Full                                                           +---------+---------------+---------+-----------+----------+-----------------+ POP      Partial        No       No                   Age Indeterminate +---------+---------------+---------+-----------+----------+-----------------+ PTV      Full                                                           +---------+---------------+---------+-----------+----------+-----------------+ PERO     None                                         Acute             +---------+---------------+---------+-----------+----------+-----------------+   +----+---------------+---------+-----------+----------+--------------+ LEFTCompressibilityPhasicitySpontaneityPropertiesThrombus Aging +----+---------------+---------+-----------+----------+--------------+ CFV Full           Yes      Yes                                 +----+---------------+---------+-----------+----------+--------------+    Summary: RIGHT: - Findings consistent with acute deep vein thrombosis involving the  right peroneal veins. - Findings consistent with age indeterminate deep vein thrombosis involving the right popliteal vein. - Findings consistent with chronic deep vein thrombosis involving the right femoral vein.  LEFT: - No evidence of common femoral vein obstruction.  *See table(s) above for measurements and observations. Electronically signed by Sherald Hess MD on 01/29/2023 at 10:46:58 AM.    Final     Procedures Procedures    Medications Ordered in ED Medications  iohexol (OMNIPAQUE) 350 MG/ML injection 75 mL (75 mLs Intravenous Contrast Given 01/29/23 1440)    ED Course/ Medical Decision Making/ A&P  Medical Decision Making Amount and/or Complexity of Data Reviewed Labs: ordered. Radiology: ordered.  Risk Prescription drug management.   This patient presents to the ED for leg swelling, shortness of breath, this involves an extensive number of treatment options, and is a complaint that carries with a high risk of complications and morbidity.  The differential diagnosis includes DVT, CHF, PE, cellulitis.  This is not an exhaustive list.  Lab tests: I ordered and personally interpreted labs.  The pertinent results include: WBC unremarkable. Hbg unremarkable. Platelets unremarkable. Electrolytes unremarkable. BUN, creatinine unremarkable.  Imaging studies: I ordered imaging studies. I personally reviewed, interpreted imaging and agree with the radiologist's interpretations. The results include: CT angio chest show segmental emboli within the right superior and inferior pulmonary arteries.  No evidence of heart strain.  Ultrasound of right lower leg showed acute DVT of the peroneal veins.  Problem list/ ED course/ Critical interventions/ Medical management: HPI: See above Vital signs within normal range and stable throughout visit. Laboratory/imaging studies significant for: See above. On physical examination, patient is afebrile and appears in no  acute distress.  There was tenderness to palpation to the right calf.  Ultrasound right lower leg showed acute DVT of the peroneal veins.  CT angio chest showed segmental emboli within the right superior and inferior pulmonary arteries.  Vital signs are stable.  Patient satting at 100% on room air.  Patient states she has been taking Eliquis 5 mg once a day.  She has a history of PE last year due to missing a few doses of Eliquis.  Patient will likely be admitted due to failing outpatient anticoagulants/medication noncompliance and acute PE and DVT. I have reviewed the patient home medicines and have made adjustments as needed.  Cardiac monitoring/EKG: The patient was maintained on a cardiac monitor.  I personally reviewed and interpreted the cardiac monitor which showed an underlying rhythm of: sinus rhythm.  Additional history obtained: External records from outside source obtained and reviewed including: Chart review including previous notes, labs, imaging.  Consultations obtained: I requested consultation with Dr. Chestine Spore vascular surgery, and discussed lab and imaging findings as well as pertinent plan before CT angio resulted.  He recommended if CT angio chest is normal patient can be discharged with increased dose of Eliquis or switched to a new anticoagulants.  Disposition Patient's care signed out at shift change to [facility], PA-C with pending heme-onc/vascular surgery consultation. This chart was dictated using voice recognition software.  Despite best efforts to proofread,  errors can occur which can change the documentation meaning.          Final Clinical Impression(s) / ED Diagnoses Final diagnoses:  Acute deep vein thrombosis (DVT) of right peroneal vein (HCC)  PE (pulmonary thromboembolism) (HCC)    Rx / DC Orders ED Discharge Orders          Ordered    APIXABAN (ELIQUIS) VTE STARTER PACK (10MG  AND 5MG )  Status:  Discontinued       Note to Pharmacy: If starter pack  unavailable, substitute with seventy-four 5 mg apixaban tabs following the above SIG directions.   01/29/23 1453              Jeanelle Malling, PA 01/29/23 1537    Arby Barrette, MD 01/30/23 [phone]

## 2023-01-29 NOTE — ED Notes (Signed)
Heparin initiated with [employer], RN

## 2023-01-29 NOTE — ED Notes (Signed)
Pt returned from vascular US. Pt a&ox4,VSS,NAD.

## 2023-01-29 NOTE — ED Notes (Signed)
Pt provided w juice and crackers while waiting for IV team to place new IV. Pt resting comfortably, VSS,NAD.

## 2023-01-30 ENCOUNTER — Other Ambulatory Visit (HOSPITAL_COMMUNITY): Payer: Self-pay

## 2023-01-30 DIAGNOSIS — I2699 Other pulmonary embolism without acute cor pulmonale: Secondary | ICD-10-CM | POA: Diagnosis not present

## 2023-01-30 DIAGNOSIS — D500 Iron deficiency anemia secondary to blood loss (chronic): Secondary | ICD-10-CM

## 2023-01-30 DIAGNOSIS — I82451 Acute embolism and thrombosis of right peroneal vein: Secondary | ICD-10-CM | POA: Diagnosis not present

## 2023-01-30 LAB — CBC
HCT: 28.8 % — ABNORMAL LOW (ref 36.0–46.0)
Hemoglobin: 8.9 g/dL — ABNORMAL LOW (ref 12.0–15.0)
MCH: 23.1 pg — ABNORMAL LOW (ref 26.0–34.0)
MCHC: 30.9 g/dL (ref 30.0–36.0)
MCV: 74.6 fL — ABNORMAL LOW (ref 80.0–100.0)
Platelets: 275 10*3/uL (ref 150–400)
RBC: 3.86 MIL/uL — ABNORMAL LOW (ref 3.87–5.11)
RDW: 18.6 % — ABNORMAL HIGH (ref 11.5–15.5)
WBC: 6 10*3/uL (ref 4.0–10.5)
nRBC: 0 % (ref 0.0–0.2)

## 2023-01-30 LAB — BASIC METABOLIC PANEL
Anion gap: 7 (ref 5–15)
BUN: 10 mg/dL (ref 6–20)
CO2: 21 mmol/L — ABNORMAL LOW (ref 22–32)
Calcium: 7.8 mg/dL — ABNORMAL LOW (ref 8.9–10.3)
Chloride: 108 mmol/L (ref 98–111)
Creatinine, Ser: 1.07 mg/dL — ABNORMAL HIGH (ref 0.44–1.00)
GFR, Estimated: >60 mL/min (ref >60)
Glucose, Bld: 122 mg/dL — ABNORMAL HIGH (ref 70–99)
Potassium: 3.8 mmol/L (ref 3.5–5.1)
Sodium: 136 mmol/L (ref 135–145)

## 2023-01-30 LAB — APTT: aPTT: 85 seconds — ABNORMAL HIGH (ref 24–36)

## 2023-01-30 LAB — HIV ANTIBODY (ROUTINE TESTING W REFLEX): HIV Screen 4th Generation wRfx: NONREACTIVE

## 2023-01-30 LAB — HEPARIN LEVEL (UNFRACTIONATED)
Heparin Unfractionated: 0.53 IU/mL (ref 0.30–0.70)
Heparin Unfractionated: 0.91 IU/mL — ABNORMAL HIGH (ref 0.30–0.70)

## 2023-01-30 MED ORDER — SODIUM CHLORIDE 0.9 % IV SOLN
250.0000 mg | Freq: Every day | INTRAVENOUS | Status: DC
  Administered 2023-01-30: 250 mg via INTRAVENOUS
  Filled 2023-01-30: qty 20

## 2023-01-30 MED ORDER — APIXABAN 5 MG PO TABS: 5.0000 mg | ORAL_TABLET | Freq: Two times a day (BID) | ORAL | Status: DC

## 2023-01-30 MED ORDER — APIXABAN (ELIQUIS) VTE STARTER PACK (10MG AND 5MG)
ORAL_TABLET | ORAL | 0 refills | Status: DC
  Filled 2023-01-30: qty 74, 30d supply, fill #0

## 2023-01-30 MED ORDER — FERROUS SULFATE 325 (65 FE) MG PO TABS
325.0000 mg | ORAL_TABLET | ORAL | 0 refills | Status: AC
  Filled 2023-01-30: qty 100, 233d supply, fill #0

## 2023-01-30 MED ORDER — APIXABAN 5 MG PO TABS
10.0000 mg | ORAL_TABLET | Freq: Two times a day (BID) | ORAL | Status: DC
  Administered 2023-01-30: 10 mg via ORAL
  Filled 2023-01-30: qty 2

## 2023-01-30 NOTE — Discharge Instructions (Addendum)
You were hospitalized for blood clots of your right leg and lungs. I am glad you are feeling better. Please make sure to take your Eliquis as prescribed, you will take 10 mg two times a day for first 7 days then switch to 5 mg two times a day. You also got IV iron in hospital and will continue iron supplement by mouth at home. Thank you for allowing [place] to be part of your care.   We arranged for you to follow up at:  [facility] 02/13/2023 10:45 AM Masters, Florentina Addison, DO   Please note these changes made to your medications:  *Please START taking:  --Eliquis 10 mg two times a day for 7 days then on Tuesday 6/25 start 5 mg two times a day  --Ferrous sulfate (iron supplement) 1 tablet every Monday, Wednesday and Friday   Please call our clinic if you have any questions or concerns, we may be able to help and keep you from a long and expensive emergency room wait. Our clinic and after hours phone number is [phone], the best time to call is Monday through Friday 9 am to 4 pm but there is always someone available 24/7 if you have an emergency. If you need medication refills please notify your pharmacy one week in advance and they will send [place] a request.  --------------------------------------------------------------- Information on my medicine - ELIQUIS (apixaban)  This medication education was reviewed with me or my healthcare representative as part of my discharge preparation.    Why was Eliquis prescribed for you? Eliquis was prescribed to treat blood clots that may have been found in the veins of your legs (deep vein thrombosis) or in your lungs (pulmonary embolism) and to reduce the risk of them occurring again.  What do You need to know about Eliquis ? The starting dose is 10 mg (two 5 mg tablets) taken TWICE daily for the FIRST SEVEN (7) DAYS, then on (enter date)  Tuesday 02/06/23  the dose is reduced to ONE 5 mg tablet taken TWICE daily.  Eliquis may be taken with or without  food.   Try to take the dose about the same time in the morning and in the evening. If you have difficulty swallowing the tablet whole please discuss with your pharmacist how to take the medication safely.  Take Eliquis exactly as prescribed and DO NOT stop taking Eliquis without talking to the doctor who prescribed the medication.  Stopping may increase your risk of developing a new blood clot.  Refill your prescription before you run out.  After discharge, you should have regular check-up appointments with your healthcare provider that is prescribing your Eliquis.    What do you do if you miss a dose? If a dose of ELIQUIS is not taken at the scheduled time, take it as soon as possible on the same day and twice-daily administration should be resumed. The dose should not be doubled to make up for a missed dose.  Important Safety Information A possible side effect of Eliquis is bleeding. You should call your healthcare provider right away if you experience any of the following: Bleeding from an injury or your nose that does not stop. Unusual colored urine (red or dark brown) or unusual colored stools (red or black). Unusual bruising for unknown reasons. A serious fall or if you hit your head (even if there is no bleeding).  Some medicines may interact with Eliquis and might increase your risk of bleeding or clotting while on  Eliquis. To help avoid this, consult your healthcare provider or pharmacist prior to using any new prescription or non-prescription medications, including herbals, vitamins, non-steroidal anti-inflammatory drugs (NSAIDs) and supplements.  This website has more information on Eliquis (apixaban): http://www.eliquis.com/eliquis/home

## 2023-01-30 NOTE — Discharge Summary (Signed)
Name: Yesenia Wheeler MRN: [id number] DOB: Apr 27, 1971 52 y.o. PCP: System, Provider Not In  Date of Admission: 01/29/2023  7:09 AM Date of Discharge:  01/30/23 Attending Physician: Dr.  Lafonda Mosses  DISCHARGE DIAGNOSIS:  Primary Problem: Pulmonary embolism [facility])   Hospital Problems: Principal Problem:   Pulmonary embolism (HCC) Active Problems:   Acute deep vein thrombosis (DVT) of right peroneal vein (HCC)    DISCHARGE MEDICATIONS:   Allergies as of 01/30/2023       Reactions   Hydrocodone Other (See Comments)   Chest pains        Medication List     STOP taking these medications    Eliquis 5 MG Tabs tablet Generic drug: apixaban Replaced by: Eliquis DVT/PE Starter Pack   furosemide 40 MG tablet Commonly known as: LASIX       TAKE these medications    albuterol 108 (90 Base) MCG/ACT inhaler Commonly known as: VENTOLIN HFA Inhale into the lungs every 6 (six) hours as needed for wheezing or shortness of breath.   DULoxetine 30 MG capsule Commonly known as: Cymbalta Take 1 capsule (30 mg total) by mouth daily.   Eliquis DVT/PE Starter Pack Generic drug: Apixaban Starter Pack (10mg  and 5mg ) Take as directed on package: start with two-5mg  tablets twice daily for 7 days. On day 8, switch to one-5mg  tablet twice daily. Replaces: Eliquis 5 MG Tabs tablet   FeroSul 325 (65 FE) MG tablet Generic drug: ferrous sulfate Take 1 tablet (325 mg total) by mouth every Monday, Wednesday, and Friday. Start taking on: January 31, 2023        DISPOSITION AND FOLLOW-UP:  [name] was discharged from [facility] in Stable condition. At the hospital follow up visit please address:  Acute R segmental PE, acute DVT Hx recurrent DVT Hx mildly decreased protein C and S function Restarted Eliquis with starter pack, ensure proper refills. Follow-up on medication adherence and provide additional counseling on correct dosing if needed. Assess for  ongoing stability from a hemodynamics and respiratory status standpoint.    Congestive heart failure Consider outpatient echocardiogram for further assessment if concern for CHF remains.   Hx iron deficiency anemia Repeat CBC, iron studies; ensure patient is adherent to outpatient iron supplementation.   Incidental thyroid nodule Please assist in arranging OP [place] follow-up.   Follow-up Recommendations: Consults: None Labs:  CBC, Iron studies  Studies: Thyroid ultrasound, consider echocardiogram. Medications: Start Eliquis starter pack, Ferrous sulfate    Follow-up Appointments:  Follow-up Information     Masters, Florentina Addison, DO. Go on 02/13/2023.   Specialty: Internal Medicine Why: Appointment on: 02/13/2023 10:45 AM Masters, Florentina Addison, DO Contact information: [address] [place] [address] [phone]                 HOSPITAL COURSE:  Patient Summary:  Yesenia Wheeler is a 52 y.o. 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 Patient was taking eliquis only once daily. She was hemodynamically stable  with no symptoms at rest, and minimal symptoms with exertion. She received a heparin bolus and was started on heparin infusion. Declined transition to Xarelto due to inconsistent eating habits. She was discharged on Eliquis (starter pack) following counseling on her medical regimen and importance of correct adherence to the medication dosing.   Congestive heart  failure Reported recent diagnosis of CHF at urgent care and given short course of Lasix. She was clinically euvolemic. Recommend outpatient echocardiogram for further assessment if concern for CHF remains.   Hx iron deficiency anemia Hgb on admission 8.9, MCV 74.0. Iron studies revealed iron 12, TIBC 456, saturation 3%, ferritin 3. Calculated iron deficit of 1276. She received one dose of Ferrlecit prior to discharge and was discharged with iron supplementation.    Hx asthma Home PRN albuterol inhaler continued for admission. No signs of asthma exacerbation.    Incidental thyroid nodule Noted on CTA chest, 2.5 cm nodule in R lobe. CT soft tissue of the neck in 01/2021 notes thyroid as appearing normal so this is new over the last 2 years. Recommend OP [place] follow-up.    DISCHARGE INSTRUCTIONS:   Discharge Instructions     Call MD for:  difficulty breathing, headache or visual disturbances   Complete by: As directed    Call MD for:  extreme fatigue   Complete by: As directed    Call MD for:  hives   Complete by: As directed    Call MD for:  persistant dizziness or light-headedness   Complete by: As directed    Call MD for:  persistant nausea and vomiting   Complete by: As directed    Call MD for:  severe uncontrolled pain   Complete by: As directed    Call MD for:  temperature >100.4   Complete by: As directed    Diet - low sodium heart healthy   Complete by: As directed    Discharge instructions   Complete by: As directed    You were hospitalized for blood clots of your right leg and lungs. I am glad you are feeling better. Please make sure to take your Eliquis as prescribed, you will take 10 mg two times a day for first 7 days then switch to 5 mg two times a day. You also got IV iron in hospital and will continue taking iron supplement by mouth at home. Thank you for allowing [place] to be part of your care.   We arranged for you to [facility] at[facility] 02/13/2023  10:45 AM Masters, Florentina Addison, DO  Please note these changes made to your medications:  *Please START taking:  --Eliquis 10 mg two times a day for 7 days then on Tuesday 6/25 start 5 mg two times a day  --Ferrous sulfate (iron supplement) 1 tablet every Monday, Wednesday and Friday   Please call our clinic if you have any questions or concerns, we may be able to help and keep you from a long and expensive emergency room wait. Our clinic and after hours phone number is [phone], the best time to call is Monday through Friday 9 am to 4 pm but there is always someone available 24/7 if you have an emergency. If you need medication refills please notify your pharmacy one week in advance and they will send [place] a request.   Increase activity slowly   Complete by: As directed        SUBJECTIVE:  Patient reports feeling well this morning.  No  new concerns.  Still has some dyspnea on exertion but none at rest.  Had some chest discomfort with deep breathing which has resolved.  Discussed compliance with taking Eliquis and patient will make sure she takes it twice a day by setting reminders on her phone.  Discussed plan for discharge today and answered all questions.  Discharge Vitals:   BP 95/69 (BP Location: Right Arm)   Pulse 70   Temp 98.4 F (36.9 C) (Oral)   Resp 15   Ht 5\' 4"  (1.626 m)   Wt 104.3 kg   SpO2 100%   BMI 39.48 kg/m   OBJECTIVE:  Constitutional: Well-appearing female sitting in bed comfortably, in no acute distress Cardiovascular: Regular rate and rhythm, no LE edema Pulmonary/Chest: Normal work of breathing on room air, lungs clear to auscultation bilaterally, no wheezing or crackles MSK: normal bulk and tone, TTP of right calf without swelling, no LE edema Neurological: alert & oriented x 3 Skin: warm and dry Psych: pleasant mood  Pertinent Labs, Studies, and Procedures:     Latest Ref Rng & Units 01/30/2023    6:12 AM 01/29/2023   10:58 AM 01/29/2023   10:50 AM  CBC   WBC 4.0 - 10.5 K/uL 6.0   5.8   Hemoglobin 12.0 - 15.0 g/dL 8.9  16.1  8.9   Hematocrit 36.0 - 46.0 % 28.8  30.0  29.6   Platelets 150 - 400 K/uL 275   312        Latest Ref Rng & Units 01/30/2023    6:12 AM 01/29/2023   10:58 AM 01/29/2023   10:50 AM  CMP  Glucose 70 - 99 mg/dL 096  89  91   BUN 6 - 20 mg/dL 10  6  6    Creatinine 0.44 - 1.00 mg/dL 0.45  4.09  8.11   Sodium 135 - 145 mmol/L 136  140  137   Potassium 3.5 - 5.1 mmol/L 3.8  3.7  3.6   Chloride 98 - 111 mmol/L 108  106  106   CO2 22 - 32 mmol/L 21   22   Calcium 8.9 - 10.3 mg/dL 7.8   8.4   Total Protein 6.5 - 8.1 g/dL   6.5   Total Bilirubin 0.3 - 1.2 mg/dL   0.6   Alkaline Phos 38 - 126 U/L   101   AST 15 - 41 U/L   19   ALT 0 - 44 U/L   11     CT Angio Chest PE W and/or Wo Contrast  Result Date: 01/29/2023 CLINICAL DATA:  Pulmonary embolism (PE) suspected, high prob shortness of breath EXAM: CT ANGIOGRAPHY CHEST WITH CONTRAST TECHNIQUE: Multidetector CT imaging of the chest was performed using the standard protocol during bolus administration of intravenous contrast. Multiplanar CT image reconstructions and MIPs were obtained to evaluate the vascular anatomy. RADIATION DOSE REDUCTION: This exam was performed according to the departmental dose-optimization program which includes automated exposure control, adjustment of the mA and/or kV according to patient size and/or use of iterative reconstruction technique. CONTRAST:  75mL OMNIPAQUE IOHEXOL 350 MG/ML SOLN COMPARISON:  Chest XR, 01/12/2023.  CTA chest, 03/21/2019. FINDINGS: Cardiovascular: Satisfactory opacification of the pulmonary arteries to the segmental level. Pulmonary emboli, as seen at the RIGHT pulmonary arterial bifurcation and involving the inferior and superior segmental pulmonary arteries. Normal heart size. No pericardial effusion.  RV/LV ratio 1.0 Mediastinum/Nodes: No enlarged mediastinal, hilar, or axillary lymph nodes. 2.5 cm RIGHT thyroid nodule.  trachea, and esophagus demonstrate no significant findings. Lungs/Pleura: Partially-calcified granuloma of the RIGHT lung base. Lungs are clear without focal consolidation, mass or suspicious pulmonary nodule. No pleural effusion or pneumothorax. Upper Abdomen: No acute abnormality. Gastric bypass. Cholecystectomy clips. Musculoskeletal: No acute chest wall abnormality. No acute or significant osseous findings. Review of the MIP images confirms the above findings. IMPRESSION: 1. Examination is POSITIVE for segmental emboli within the RIGHT superior and inferior pulmonary arteries. No CT evidence of RIGHT heart strain, with RV/LV ratio of 1.0. Please refer to the "Code PE Focused" order set in EPIC. 2. 2.5 cm RIGHT thyroid nodule. Recommend non-emergent, outpatient thyroid ultrasound if not previously performed Reference: J Am Coll Radiol. 2015 Feb;12(2): 143-50 These results will be called to the ordering clinician or representative by the Radiologist Assistant, and communication documented in the PACS or [employer]. Electronically Signed   By: Roanna Banning M.D.   On: 01/29/2023 15:11   VAS [place] LOWER EXTREMITY VENOUS (DVT) (ONLY [facility] & [facility])  Result Date: 01/29/2023  Lower Venous DVT Study Patient Name:  SERAPHIM MANGELSDORF  Date of Exam:   01/29/2023 Medical Rec #: [id number]           Accession #:    [id number] Date of Birth: December 06, 1970           Patient Gender: F Patient Age:   75 years Exam Location:  [facility] Procedure:      VAS [place] LOWER EXTREMITY VENOUS (DVT) Referring Phys: KEN LE --------------------------------------------------------------------------------  Indications: New calf pain.  Risk Factors: History of DVT, on Eliquis. Anticoagulation: Eliquis. Comparison Study: Prior study done 03/11/20 indicated age indeterminate DVT in                   the right soleal. Performing Technologist: Sherren Kerns RVS  Examination Guidelines: A complete evaluation includes B-mode imaging, spectral  Doppler, color Doppler, and power Doppler as needed of all accessible portions of each vessel. Bilateral testing is considered an integral part of a complete examination. Limited examinations for reoccurring indications may be performed as noted. The reflux portion of the exam is performed with the patient in reverse Trendelenburg.  +---------+---------------+---------+-----------+----------+-----------------+ RIGHT    CompressibilityPhasicitySpontaneityPropertiesThrombus Aging    +---------+---------------+---------+-----------+----------+-----------------+ CFV      Full           Yes      Yes                                    +---------+---------------+---------+-----------+----------+-----------------+ SFJ      Full                                                           +---------+---------------+---------+-----------+----------+-----------------+ FV Prox  Full                                                           +---------+---------------+---------+-----------+----------+-----------------+ FV Mid   Partial  Chronic           +---------+---------------+---------+-----------+----------+-----------------+ FV DistalPartial                                      Chronic           +---------+---------------+---------+-----------+----------+-----------------+ PFV      Full                                                           +---------+---------------+---------+-----------+----------+-----------------+ POP      Partial        No       No                   Age Indeterminate +---------+---------------+---------+-----------+----------+-----------------+ PTV      Full                                                           +---------+---------------+---------+-----------+----------+-----------------+ PERO     None                                         Acute              +---------+---------------+---------+-----------+----------+-----------------+   +----+---------------+---------+-----------+----------+--------------+ LEFTCompressibilityPhasicitySpontaneityPropertiesThrombus Aging +----+---------------+---------+-----------+----------+--------------+ CFV Full           Yes      Yes                                 +----+---------------+---------+-----------+----------+--------------+    Summary: RIGHT: - Findings consistent with acute deep vein thrombosis involving the right peroneal veins. - Findings consistent with age indeterminate deep vein thrombosis involving the right popliteal vein. - Findings consistent with chronic deep vein thrombosis involving the right femoral vein.  LEFT: - No evidence of common femoral vein obstruction.  *See table(s) above for measurements and observations. Electronically signed by Sherald Hess MD on 01/29/2023 at 10:46:58 AM.    Final      Signed: Rana Snare, DO Internal Medicine Resident, PGY-1 [facility]  Pager: [phone]

## 2023-01-30 NOTE — Progress Notes (Signed)
Discharge:   AVS reviewed with patient including self care and medications.  Medications provided by [facility] pharmacy at discharge.   IV access discontinued, CCMD notified.  Work noted printed and provided with AVS.   Assisted to private vehicle by staff

## 2023-01-30 NOTE — Progress Notes (Signed)
ANTICOAGULATION CONSULT NOTE   Pharmacy Consult for heparin Indication: pulmonary embolus and DVT  Allergies  Allergen Reactions   Hydrocodone Other (See Comments)    Chest pains    Patient Measurements: Height: 5\' 4"  (162.6 cm) Weight: 104.3 kg (230 lb) IBW/kg (Calculated) : 54.7 Heparin Dosing Weight: 79.2 kg  Vital Signs: Temp: 97.9 F (36.6 C) (06/17 2319) Temp Source: Oral (06/17 2319) BP: 119/80 (06/17 2319) Pulse Rate: 68 (06/17 2319)  Labs: Recent Labs    01/29/23 1050 01/29/23 1058 01/29/23 2329  HGB 8.9* 10.2*  --   HCT 29.6* 30.0*  --   PLT 312  --   --   APTT  --   --  85*  HEPARINUNFRC  --   --  0.53  CREATININE 0.97 1.00  --      Estimated Creatinine Clearance: 78.3 mL/min (by C-G formula based on SCr of 1 mg/dL).   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      DULoxetine (CYMBALTA) 30 MG capsule Take 1 capsule (30 mg total) by mouth daily. 90 capsule 1    ELIQUIS 5 MG TABS tablet TAKE 1 TABLET BY MOUTH TWICE A DAY 60 tablet 0    furosemide (LASIX) 40 MG tablet Take 1 tablet (40 mg total) by mouth daily. 14 tablet 0     Assessment: 44 yoF with PMH of DVT, PE, on Eliquis presented with SOB, leg swelling/cramping. Pharmacy consulted to dose heparin for PE/DVT. CTA showed PE  and DVT in inferior and superior segmental arteries and based on patient's fill history and patient stating only taking it once daily, shows non-adherence with her Eliquis. Patient states her last dose was 6/16 PM.  Hgb 8.9 and plts 312  6/18 AM update: Heparin level is therapeutic and correlating with aPTT Will use heparin level only   Goal of Therapy:  Heparin level 0.3-0.7 units/mL Monitor platelets by anticoagulation protocol: Yes   Plan:  Cont heparin 1300 units/hr Heparin level with AM labs  Abran Duke, PharmD, BCPS Clinical Pharmacist Phone: [phone]

## 2023-02-13 ENCOUNTER — Encounter: Payer: PRIVATE HEALTH INSURANCE | Admitting: Internal Medicine

## 2023-03-03 NOTE — Progress Notes (Unsigned)
Cardiology Office Note:   Date:  03/05/2023  NAME:  Yesenia Wheeler    MRN: [id number] DOB:  April 11, 1971   PCP:  System, Provider Not In  Cardiologist:  Reatha Harps, MD  Electrophysiologist:  None   Referring MD: Adron Bene, MD   Chief Complaint  Patient presents with   Leg Swelling    History of Present Illness:   Yesenia Wheeler is a 52 y.o. female with a hx of DVT/PE who is being seen today for the evaluation of CP/LE edema at the request of Adron Bene, MD. Seen in urgent care for LE edema and Chest pain. Symptoms worsened and admitted in June for DVT/PE.   She reports for the past 6 months she has had lower extremity edema.  She also reports chest pain which is dull and constant for the past 6 months.  Symptoms have worsened.  She was actually diagnosed with DVT.  She had an acute right peroneal DVT and acute right popliteal vein.  She also had a chronic clot in the right femoral vein.  She tells me the right leg swells more than the left.  She reports no shortness of breath.  Lungs are clear.  EKG shows normal sinus rhythm with poor R wave progression which is likely obesity related.  She had multiple blood clots.  She was taking Eliquis incorrectly.  Now taking this correctly.  Her symptoms of chest discomfort seems to be improving.  Chest CT with no evidence of coronary calcium.  I do wonder if she has venous obstruction in the legs to explain her edema.  We discussed being evaluated by vascular surgery.  She has no evidence of edema today.  Her BNP values are normal.  She has no evidence of congestive heart failure.  She does not smoke.  No alcohol or drug use.  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  She has 2 children.  1 son still lives with her.  All of her labs are within limits except for her chronic anemia.  She does have uterine fibroids.  She may be looking for hysterectomy at some point.  No strong family history of heart disease.  Again no evidence of heart  failure today.   Problem List DVT/PE -Dx 01/2023 2.  Chronic right femoral vein DVT  Past Medical History: 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 , complex sclerosing lesion   Migraines    Mild asthma    OA (osteoarthritis)    bilateral knees   Parsonage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    SBO (small bowel obstruction) (HCC) 09/2020    Past Surgical History: Past Surgical History:  Procedure Laterality Date   BREAST LUMPECTOMY WITH RADIOACTIVE SEED LOCALIZATION Left 10/23/2017   Procedure: BREAST LUMPECTOMY WITH RADIOACTIVE SEED LOCALIZATION;  Surgeon: Harriette Bouillon, MD;  Location: [facility];  Service: General;  Laterality: Left;   BREAST REDUCTION SURGERY Bilateral 10/23/2017   Procedure: LEFT BREAST ONCOPLASTIC REDUCTION, RIGHT BREAST REDUCTION;  Surgeon: Glenna Fellows, MD;  Location: [facility];  Service: Plastics;  Laterality: Bilateral;   CESAREAN SECTION  2011   GASTRIC BYPASS  2001   KNEE  ARTHROSCOPY W/ MENISCECTOMY Right 2007   KNEE ARTHROSCOPY WITH MEDIAL MENISECTOMY Left 08/30/2017   Procedure: KNEE ARTHROSCOPY WITH PARTIAL MEDIAL MENISECTOMY;  Surgeon: Yolonda Kida, MD;  Location: [facility];  Service: Orthopedics;  Laterality: Left;  60 mins   LAPAROSCOPIC CHOLECYSTECTOMY  2001   SHOULDER ARTHROSCOPY Left 2008   spur    Current Medications: Current Meds  Medication Sig   albuterol (VENTOLIN HFA) 108 (90 Base) MCG/ACT inhaler Inhale into the lungs every 6 (six) hours as needed for wheezing or shortness of breath.   APIXABAN (ELIQUIS) VTE STARTER PACK (10MG  AND 5MG ) Take as directed on package: start with two-5mg  tablets twice daily for 7 days. On day 8, switch to one-5mg   tablet twice daily.   ferrous sulfate 325 (65 FE) MG tablet Take 1 tablet (325 mg total) by mouth every Monday, Wednesday, and Friday.   furosemide (LASIX) 20 MG tablet Take 1 tablet (20 mg total) by mouth as needed. For swelling     Allergies:    Hydrocodone   Social History: Social History   Socioeconomic History   Marital status: Divorced    Spouse name: Caryn Bee   Number of children: 2   Years of education: Associates   Highest education level: Not on file  Occupational History   Occupation: LPN    Employer: [employer]  Tobacco Use   Smoking status: Former    Current packs/day: 0.00    Types: Cigarettes    Start date: 06/24/2011    Quit date: 06/23/2017    Years since quitting: 5.7    Passive exposure: Past   Smokeless tobacco: Never   Tobacco comments:    2ppwk  Vaping Use   Vaping status: Every Day   Substances: Nicotine, Flavoring  Substance and Sexual Activity   Alcohol use: Not Currently    Comment: occasional   Drug use: No   Sexual activity: Yes    Birth control/protection: None  Other Topics Concern   Not on file  Social History Narrative   Lives with her husband and their children   Right Handed   Drinks >10 cups caffeine   Social Determinants of Health   Financial Resource Strain: Not on file  Food Insecurity: Not on file  Transportation Needs: Not on file  Physical Activity: Not on file  Stress: Not on file  Social Connections: Not on file     Family History: The patient's family history includes Cancer in her father.  ROS:   All other ROS reviewed and negative. Pertinent positives noted in the HPI.     EKGs/Labs/Other Studies Reviewed:   The following studies were personally reviewed by me today:  EKG:  EKG is not ordered today.  EKG reviewed from 01/29/2023.  That EKG demonstrates sinus rhythm heart rate 74 with poor R wave progression.  No acute ischemic changes.  Recent Labs: 01/29/2023: ALT 11; B Natriuretic Peptide  26.7 01/30/2023: BUN 10; Creatinine, Ser 1.07; Hemoglobin 8.9; Platelets 275; Potassium 3.8; Sodium 136   Recent Lipid Panel    Component Value Date/Time   CHOL 155 12/30/2020 1112   TRIG 22 12/30/2020 1112   HDL 40 (L) 12/30/2020 1112   CHOLHDL 3.9 12/30/2020 1112   VLDL 4 12/30/2020 1112   LDLCALC 111 (H) 12/30/2020 1112    Physical Exam:   VS:  BP 118/78   Pulse 78   Ht 5\' 3"  (1.6 m)   Wt 248 lb 12.8 oz (112.9 kg)  LMP 02/16/2023   SpO2 99%   BMI 44.07 kg/m    Wt Readings from Last 3 Encounters:  03/05/23 248 lb 12.8 oz (112.9 kg)  01/29/23 230 lb (104.3 kg)  03/20/22 230 lb (104.3 kg)    General: Well nourished, well developed, in no acute distress Head: Atraumatic, normal size  Eyes: PEERLA, EOMI  Neck: Supple, no JVD Endocrine: No thryomegaly Cardiac: Normal S1, S2; RRR; no murmurs, rubs, or gallops Lungs: Clear to auscultation bilaterally, no wheezing, rhonchi or rales  Abd: Soft, nontender, no hepatomegaly  Ext: No edema, pulses 2+ Musculoskeletal: No deformities, BUE and BLE strength normal and equal Skin: Warm and dry, no rashes   Neuro: Alert and oriented to person, place, time, and situation, CNII-XII grossly intact, no focal deficits  Psych: Normal mood and affect   ASSESSMENT:   [place] Doig is a 52 y.o. female who presents for the following: 1. Precordial pain   2. SOB (shortness of breath) on exertion   3. 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 Other pulmonary embolism without acute cor pulmonale, unspecified chronicity (HCC)   5. Venous obstruction   6. Leg edema     PLAN:   1. Precordial pain 2. SOB (shortness of breath) on exertion 3. 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 Other pulmonary embolism without acute cor pulmonale, unspecified chronicity (HCC) 5. Venous obstruction 6. Leg edema -She reports leg edema for the past 6 months.  Now diagnosed with acute DVT in the  right peroneal and right popliteal veins.  She has chronic clot in the right femoral vein.  She tells me she has right greater than left edema.  She is on Eliquis 5 mg twice daily which she will continue indefinitely.  She has no evidence of congestive heart failure on my examination.  We will obtain an echocardiogram to formally exclude this.  BNP values are normal.  No edema on exam.  I do believe some of her edema is likely related to chronic DVT in the right femoral vein.  I would like for her to see vein and vascular surgery to determine if they can offer her any assistance for procedures to relieve this obstruction.  CT scan without edema.  She can take Lasix as needed in the meantime.  We discussed compression stockings.  Her chest discomfort is likely related to PE.  It is improving.  No coronary calcium.  I do not believe this is related to epicardial coronary artery disease.  Her EKG is within limits.  No strong family history of heart disease.  We will see her back in 6 months to reassess but I believe this is all related to DVT and PE.  Disposition: Return in about 6 months (around 09/05/2023).  Medication Adjustments/Labs and Tests Ordered: Current medicines are reviewed at length with the patient today.  Concerns regarding medicines are outlined above.  Orders Placed This Encounter  Procedures   Ambulatory referral to Vascular Surgery   ECHOCARDIOGRAM COMPLETE   Meds ordered this encounter  Medications   furosemide (LASIX) 20 MG tablet    Sig: Take 1 tablet (20 mg total) by mouth as needed. For swelling    Dispense:  30 tablet    Refill:  6   Patient Instructions  Medication Instructions:    May  use Furosemide ( Lasix ) 20 mg as needed for swelling  daily    *If you need a refill  on your cardiac medications before your next appointment, please call your pharmacy*   Lab Work:  Not needed   Testing/Procedures:  Will be schedule at [facility] [address] Your  physician has requested that you have an echocardiogram. Echocardiography is a painless test that uses sound waves to create images of your heart. It provides your doctor with information about the size and shape of your heart and how well your heart's chambers and valves are working. This procedure takes approximately one hour. There are no restrictions for this procedure. Please do NOT wear cologne, perfume, aftershave, or lotions (deodorant is allowed). Please arrive 15 minutes prior to your appointment time.   Follow-Up: At [facility], you and your health needs are our priority.  As part of our continuing mission to provide you with exceptional heart care, we have created designated Provider Care Teams.  These Care Teams include your primary Cardiologist (physician) and Advanced Practice Providers (APPs -  Physician Assistants and Nurse Practitioners) who all work together to provide you with the care you need, when you need it.     Your next appointment:   6 month(s)  The format for your next appointment:   In Person  Provider:   Reatha Harps, MD   You have been referred to  Vascular and Vein  Surgery -   chronic DVT, and edema         Signed, Gerri Spore T. Flora Lipps, MD, [facility]  [facility]  [address] [place], [place] [address] [phone]  03/05/2023 9:48 AM

## 2023-03-05 ENCOUNTER — Other Ambulatory Visit: Payer: Self-pay | Admitting: Cardiovascular Disease

## 2023-03-05 ENCOUNTER — Ambulatory Visit
Payer: No Typology Code available for payment source | Attending: Cardiovascular Disease | Admitting: Cardiovascular Disease

## 2023-03-05 ENCOUNTER — Encounter: Payer: Self-pay | Admitting: Cardiovascular Disease

## 2023-03-05 VITALS — BP 118/78 | HR 78 | Ht 63.0 in | Wt 248.8 lb

## 2023-03-05 DIAGNOSIS — R072 Precordial pain: Secondary | ICD-10-CM | POA: Diagnosis not present

## 2023-03-05 DIAGNOSIS — I82402 Acute embolism and thrombosis of unspecified deep veins of left lower extremity: Secondary | ICD-10-CM

## 2023-03-05 DIAGNOSIS — R0602 Shortness of breath: Secondary | ICD-10-CM

## 2023-03-05 DIAGNOSIS — I2699 Other pulmonary embolism without acute cor pulmonale: Secondary | ICD-10-CM

## 2023-03-05 DIAGNOSIS — R600 Localized edema: Secondary | ICD-10-CM

## 2023-03-05 DIAGNOSIS — I871 Compression of vein: Secondary | ICD-10-CM

## 2023-03-05 MED ORDER — FUROSEMIDE 20 MG PO TABS: 20.0000 mg | ORAL_TABLET | ORAL | 6 refills | Status: AC | PRN

## 2023-03-05 NOTE — Patient Instructions (Addendum)
Medication Instructions:    May  use Furosemide ( Lasix ) 20 mg as needed for swelling  daily    *If you need a refill on your cardiac medications before your next appointment, please call your pharmacy*   Lab Work:  Not needed   Testing/Procedures:  Will be schedule at [facility] [address] Your physician has requested that you have an echocardiogram. Echocardiography is a painless test that uses sound waves to create images of your heart. It provides your doctor with information about the size and shape of your heart and how well your heart's chambers and valves are working. This procedure takes approximately one hour. There are no restrictions for this procedure. Please do NOT wear cologne, perfume, aftershave, or lotions (deodorant is allowed). Please arrive 15 minutes prior to your appointment time.   Follow-Up: At [facility], you and your health needs are our priority.  As part of our continuing mission to provide you with exceptional heart care, we have created designated Provider Care Teams.  These Care Teams include your primary Cardiologist (physician) and Advanced Practice Providers (APPs -  Physician Assistants and Nurse Practitioners) who all work together to provide you with the care you need, when you need it.     Your next appointment:   6 month(s)  The format for your next appointment:   In Person  Provider:   Reatha Harps, MD   You have been referred to  Vascular and Vein  Surgery -   chronic DVT, and edema

## 2023-03-26 NOTE — Progress Notes (Unsigned)
Office Note     CC: Right lower extremity swelling and edema Requesting Provider:  Sande Rives, *  HPI: Yesenia Wheeler is a 52 y.o. (12-Jan-1971) female who presents at the request of System, Provider Not In for evaluation of right lower extremity swelling and edema with known DVT.  On exam, Yesenia Wheeler was doing well.  Native of [place], she works as an Public house manager.  She has 2 jobs, 2 children, and her youngest son still lives with her.    Over the last 6 months, she has noted left lower extremity edema.  She was diagnosed previously with DVT x 2 most recently acute right peroneal DVT, popliteal vein DVT.   There is also chronic component in the femoral vein. Prior hypercoagulable workup has been suspicious for decrease protein S and protein C activity (02/2020). She has bilateral leg swelling, right greater than left.  She is anticoagulated with Eliquis.  Yesenia Wheeler states her legs feel tired by the end of the day with accompanying heaviness and aching..  She denies bleeding, ulceration.  She has not worn compression stockings. No history of vascular venous surgery.   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 , complex sclerosing lesion   Migraines    Mild asthma    OA (osteoarthritis)    bilateral knees   Parsonage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    SBO (small bowel obstruction) (HCC) 09/2020    Past Surgical History:  Procedure Laterality Date   BREAST LUMPECTOMY WITH RADIOACTIVE SEED LOCALIZATION Left 10/23/2017   Procedure: BREAST LUMPECTOMY WITH RADIOACTIVE SEED LOCALIZATION;  Surgeon: Harriette Bouillon, MD;  Location: [facility];  Service: General;  Laterality: Left;   BREAST REDUCTION SURGERY Bilateral 10/23/2017   Procedure:  LEFT BREAST ONCOPLASTIC REDUCTION, RIGHT BREAST REDUCTION;  Surgeon: Glenna Fellows, MD;  Location: [facility];  Service: Plastics;  Laterality: Bilateral;   CESAREAN SECTION  2011   GASTRIC BYPASS  2001   KNEE ARTHROSCOPY W/ MENISCECTOMY Right 2007   KNEE ARTHROSCOPY WITH MEDIAL MENISECTOMY Left 08/30/2017   Procedure: KNEE ARTHROSCOPY WITH PARTIAL MEDIAL MENISECTOMY;  Surgeon: Yolonda Kida, MD;  Location: [facility];  Service: Orthopedics;  Laterality: Left;  60 mins   LAPAROSCOPIC CHOLECYSTECTOMY  2001   SHOULDER ARTHROSCOPY Left 2008   spur    Social History   Socioeconomic History   Marital status: Divorced    Spouse name: Caryn Bee   Number of children: 2   Years of education: Associates   Highest education level: Not on file  Occupational History   Occupation: LPN    Employer: [employer]  Tobacco Use   Smoking status: Former    Current packs/day: 0.00    Types: Cigarettes    Start date: 06/24/2011    Quit date: 06/23/2017    Years since quitting: 5.7    Passive exposure: Past   Smokeless tobacco: Never   Tobacco comments:    2ppwk  Vaping Use   Vaping status: Every Day   Substances: Nicotine, Flavoring  Substance and Sexual Activity   Alcohol use: Not Currently    Comment: occasional   Drug use: No   Sexual activity:  Yes    Birth control/protection: None  Other Topics Concern   Not on file  Social History Narrative   Lives with her husband and their children   Right Handed   Drinks >10 cups caffeine   Social Determinants of Health   Financial Resource Strain: Not on file  Food Insecurity: Not on file  Transportation Needs: Not on file  Physical Activity: Not on file  Stress: Not on file  Social Connections: Not on file  Intimate Partner Violence: Not on file   Family History  Problem Relation Age of Onset   Cancer Father        oral    Current Outpatient Medications  Medication Sig  Dispense Refill   albuterol (VENTOLIN HFA) 108 (90 Base) MCG/ACT inhaler Inhale into the lungs every 6 (six) hours as needed for wheezing or shortness of breath.     APIXABAN (ELIQUIS) VTE STARTER PACK (10MG  AND 5MG ) Take as directed on package: start with two-5mg  tablets twice daily for 7 days. On day 8, switch to one-5mg  tablet twice daily. 74 each 0   DULoxetine (CYMBALTA) 30 MG capsule Take 1 capsule (30 mg total) by mouth daily. 90 capsule 1   ferrous sulfate 325 (65 FE) MG tablet Take 1 tablet (325 mg total) by mouth every Monday, Wednesday, and Friday. 100 tablet 0   furosemide (LASIX) 20 MG tablet Take 1 tablet (20 mg total) by mouth as needed. For swelling 30 tablet 6   No current facility-administered medications for this visit.    Allergies  Allergen Reactions   Hydrocodone Other (See Comments)    Chest pains     REVIEW OF SYSTEMS:  [X]  denotes positive finding, [ ]  denotes negative finding Cardiac  Comments:  Chest pain or chest pressure:    Shortness of breath upon exertion:    Short of breath when lying flat:    Irregular heart rhythm:        Vascular    Pain in calf, thigh, or hip brought on by ambulation:    Pain in feet at night that wakes you up from your sleep:     Blood clot in your veins:    Leg swelling:         Pulmonary    Oxygen at home:    Productive cough:     Wheezing:         Neurologic    Sudden weakness in arms or legs:     Sudden numbness in arms or legs:     Sudden onset of difficulty speaking or slurred speech:    Temporary loss of vision in one eye:     Problems with dizziness:         Gastrointestinal    Blood in stool:     Vomited blood:         Genitourinary    Burning when urinating:     Blood in urine:        Psychiatric    Major depression:         Hematologic    Bleeding problems:    Problems with blood clotting too easily:        Skin    Rashes or ulcers:        Constitutional    Fever or chills:      PHYSICAL  EXAMINATION:  There were no vitals filed for this visit.  General:  WDWN in NAD; vital signs documented above Gait: Not observed HENT: WNL,  normocephalic Pulmonary: normal non-labored breathing , without Rales, rhonchi,  wheezing Cardiac: regular HR Abdomen: soft, NT, no masses Skin: without rashes Vascular Exam/Pulses:  Right Left  Radial 2+ (normal) 2+ (normal)  Ulnar    Femoral    Popliteal    DP 2+ (normal) 2+ (normal)  PT     Extremities: without ischemic changes, without Gangrene , without cellulitis; without open wounds;  Muscular mass on the lateral aspect of the right lower leg tender to palpation.   Musculoskeletal: no muscle wasting or atrophy  Neurologic: A&O X 3;  No focal weakness or paresthesias are detected Psychiatric:  The pt has Normal affect.   Non-Invasive Vascular Imaging:   +---------+---------------+---------+-----------+----------+---------------  --+  RIGHT   CompressibilityPhasicitySpontaneityPropertiesThrombus Aging      +---------+---------------+---------+-----------+----------+---------------  --+  CFV     Full           Yes      Yes                                      +---------+---------------+---------+-----------+----------+---------------  --+  SFJ     Full                                                             +---------+---------------+---------+-----------+----------+---------------  --+  FV Prox  Full                                                             +---------+---------------+---------+-----------+----------+---------------  --+  FV Mid   Partial                                      Chronic             +---------+---------------+---------+-----------+----------+---------------  --+  FV DistalPartial                                      Chronic             +---------+---------------+---------+-----------+----------+---------------  --+  PFV     Full                                                              +---------+---------------+---------+-----------+----------+---------------  --+  POP     Partial        No       No                   Age  Indeterminate  +---------+---------------+---------+-----------+----------+---------------  --+  PTV     Full                                                             +---------+---------------+---------+-----------+----------+---------------  --+  PERO    None                                         Acute               +---------+---------------+---------+-----------+----------+---------------     ASSESSMENT/PLAN:: 52 y.o. female presenting with right lower extremity acute on chronic DVT with accompanying swelling. 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  Yesenia Wheeler confided she is gained 50 pounds over the last 2 years due to her divorce.  There isn't a surgical intervention that will improve her swelling.  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  Compression works by reducing venous hypertension.  We discussed the importance of continuing to live an active lifestyle, with daily compression, as well as elevation when able.  She is aware that weight loss would also provide significant improvement in her symptoms.   Yesenia Wheeler can follow-up with my office as needed.   Victorino Sparrow, MD [facility] [phone]

## 2023-03-27 ENCOUNTER — Ambulatory Visit (INDEPENDENT_AMBULATORY_CARE_PROVIDER_SITE_OTHER): Payer: No Typology Code available for payment source | Admitting: Vascular Surgery

## 2023-03-27 ENCOUNTER — Encounter: Payer: Self-pay | Admitting: Vascular Surgery

## 2023-03-27 VITALS — BP 116/79 | HR 73 | Temp 98.0°F | Resp 18 | Ht 64.0 in | Wt 252.6 lb

## 2023-03-27 DIAGNOSIS — M7989 Other specified soft tissue disorders: Secondary | ICD-10-CM

## 2023-03-27 DIAGNOSIS — I87001 Postthrombotic syndrome without complications of right lower extremity: Secondary | ICD-10-CM

## 2023-03-29 ENCOUNTER — Ambulatory Visit (HOSPITAL_COMMUNITY): Payer: No Typology Code available for payment source | Attending: Internal Medicine

## 2023-03-29 DIAGNOSIS — R072 Precordial pain: Secondary | ICD-10-CM | POA: Insufficient documentation

## 2023-03-29 DIAGNOSIS — R0602 Shortness of breath: Secondary | ICD-10-CM | POA: Insufficient documentation

## 2023-03-29 LAB — ECHOCARDIOGRAM COMPLETE
Area-P 1/2: 3.48 cm2
Calc EF: 55.1 %
S' Lateral: 3.5 cm
Single Plane A2C EF: 55.3 %
Single Plane A4C EF: 56.7 %

## 2023-04-24 ENCOUNTER — Encounter: Payer: No Typology Code available for payment source | Admitting: Vascular Surgery

## 2023-06-24 ENCOUNTER — Emergency Department (HOSPITAL_BASED_OUTPATIENT_CLINIC_OR_DEPARTMENT_OTHER): Payer: PRIVATE HEALTH INSURANCE

## 2023-06-24 ENCOUNTER — Other Ambulatory Visit: Payer: Self-pay

## 2023-06-24 ENCOUNTER — Emergency Department (HOSPITAL_BASED_OUTPATIENT_CLINIC_OR_DEPARTMENT_OTHER)
Admission: EM | Admit: 2023-06-24 | Discharge: 2023-06-24 | Disposition: A | Discharge status: Home / Self Care | Payer: PRIVATE HEALTH INSURANCE | Source: Home / Self Care | Attending: Emergency Medicine | Admitting: Emergency Medicine

## 2023-06-24 ENCOUNTER — Encounter (HOSPITAL_BASED_OUTPATIENT_CLINIC_OR_DEPARTMENT_OTHER): Payer: Self-pay

## 2023-06-24 ENCOUNTER — Encounter (HOSPITAL_COMMUNITY): Payer: Self-pay

## 2023-06-24 ENCOUNTER — Emergency Department (HOSPITAL_COMMUNITY)
Admission: EM | Admit: 2023-06-24 | Discharge: 2023-06-24 | Discharge status: Against Medical Advice | Payer: PRIVATE HEALTH INSURANCE | Attending: Emergency Medicine | Admitting: Emergency Medicine

## 2023-06-24 ENCOUNTER — Emergency Department (HOSPITAL_COMMUNITY): Payer: PRIVATE HEALTH INSURANCE

## 2023-06-24 DIAGNOSIS — I2694 Multiple subsegmental pulmonary emboli without acute cor pulmonale: Secondary | ICD-10-CM | POA: Insufficient documentation

## 2023-06-24 DIAGNOSIS — Z7901 Long term (current) use of anticoagulants: Secondary | ICD-10-CM | POA: Insufficient documentation

## 2023-06-24 DIAGNOSIS — Z1152 Encounter for screening for COVID-19: Secondary | ICD-10-CM | POA: Insufficient documentation

## 2023-06-24 DIAGNOSIS — M79604 Pain in right leg: Secondary | ICD-10-CM | POA: Insufficient documentation

## 2023-06-24 DIAGNOSIS — M79662 Pain in left lower leg: Secondary | ICD-10-CM | POA: Insufficient documentation

## 2023-06-24 DIAGNOSIS — R0602 Shortness of breath: Secondary | ICD-10-CM | POA: Diagnosis present

## 2023-06-24 DIAGNOSIS — R079 Chest pain, unspecified: Secondary | ICD-10-CM | POA: Insufficient documentation

## 2023-06-24 DIAGNOSIS — M79605 Pain in left leg: Secondary | ICD-10-CM

## 2023-06-24 DIAGNOSIS — Z5321 Procedure and treatment not carried out due to patient leaving prior to being seen by health care provider: Secondary | ICD-10-CM | POA: Insufficient documentation

## 2023-06-24 LAB — COMPREHENSIVE METABOLIC PANEL
ALT: 12 U/L (ref 0–44)
AST: 18 U/L (ref 15–41)
Albumin: 2.9 g/dL — ABNORMAL LOW (ref 3.5–5.0)
Alkaline Phosphatase: 111 U/L (ref 38–126)
Anion gap: 7 (ref 5–15)
BUN: 10 mg/dL (ref 6–20)
CO2: 23 mmol/L (ref 22–32)
Calcium: 8.2 mg/dL — ABNORMAL LOW (ref 8.9–10.3)
Chloride: 110 mmol/L (ref 98–111)
Creatinine, Ser: 0.8 mg/dL (ref 0.44–1.00)
GFR, Estimated: >60 mL/min (ref >60)
Glucose, Bld: 91 mg/dL (ref 70–99)
Potassium: 3.6 mmol/L (ref 3.5–5.1)
Sodium: 140 mmol/L (ref 135–145)
Total Bilirubin: 0.5 mg/dL (ref <1.2)
Total Protein: 6.2 g/dL — ABNORMAL LOW (ref 6.5–8.1)

## 2023-06-24 LAB — RESP PANEL BY RT-PCR (RSV, FLU A&B, COVID)  RVPGX2
Influenza A by PCR: NEGATIVE
Influenza B by PCR: NEGATIVE
Resp Syncytial Virus by PCR: NEGATIVE
SARS Coronavirus 2 by RT PCR: NEGATIVE

## 2023-06-24 LAB — CBC
HCT: 28.4 % — ABNORMAL LOW (ref 36.0–46.0)
Hemoglobin: 8.5 g/dL — ABNORMAL LOW (ref 12.0–15.0)
MCH: 22.8 pg — ABNORMAL LOW (ref 26.0–34.0)
MCHC: 29.9 g/dL — ABNORMAL LOW (ref 30.0–36.0)
MCV: 76.1 fL — ABNORMAL LOW (ref 80.0–100.0)
Platelets: 263 10*3/uL (ref 150–400)
RBC: 3.73 MIL/uL — ABNORMAL LOW (ref 3.87–5.11)
RDW: 18.1 % — ABNORMAL HIGH (ref 11.5–15.5)
WBC: 7.2 10*3/uL (ref 4.0–10.5)
nRBC: 0 % (ref 0.0–0.2)

## 2023-06-24 LAB — HCG, SERUM, QUALITATIVE: Preg, Serum: NEGATIVE

## 2023-06-24 LAB — TROPONIN I (HIGH SENSITIVITY): Troponin I (High Sensitivity): 4 ng/L (ref <18)

## 2023-06-24 MED ORDER — APIXABAN 2.5 MG PO TABS
10.0000 mg | ORAL_TABLET | Freq: Once | ORAL | Status: AC
  Administered 2023-06-24: 10 mg via ORAL
  Filled 2023-06-24: qty 4

## 2023-06-24 MED ORDER — APIXABAN (ELIQUIS) VTE STARTER PACK (10MG AND 5MG)
ORAL_TABLET | ORAL | 0 refills | Status: AC
Start: 2023-06-24 — End: ?

## 2023-06-24 MED ORDER — IOHEXOL 350 MG/ML SOLN
75.0000 mL | Freq: Once | INTRAVENOUS | Status: AC | PRN
  Administered 2023-06-24: 75 mL via INTRAVENOUS

## 2023-06-24 NOTE — Progress Notes (Signed)
Yesenia Wheeler ambulated on room air.  Her O2 sat=96%

## 2023-06-24 NOTE — ED Notes (Signed)
Called for patient, but no response.

## 2023-06-24 NOTE — ED Provider Notes (Signed)
[place] EMERGENCY DEPARTMENT AT [facility] Provider Note   CSN: [id number] Arrival date & time: 06/24/23  7253     History  Chief Complaint  Patient presents with   Shortness of Breath    Yesenia Wheeler is a 52 y.o. female.  Patient presents to the emergency department today for evaluation of left lower extremity pain and shortness of breath.  Patient has a history of DVT.  She is on a apixaban, however admits to not taking this for the past 2 weeks because she ran out of medication.  Patient states that approximately 6 days ago she was at work and began feeling dizzy and short of breath with activity.  She states that this has been persistent.  Over the past 3 to 4 days she has developed pain in her entire left leg that started at the ankle and calf area.  She feels that her symptoms are consistent with previous DVT.  No lightheadedness or syncope.  She has had some chest pains that "come and go".  Minimal non-productive cough.  Patient was at another ED prior to visit today and had labs drawn.       Home Medications Prior to Admission medications   Medication Sig Start Date End Date Taking? Authorizing Provider  albuterol (VENTOLIN HFA) 108 (90 Base) MCG/ACT inhaler Inhale into the lungs every 6 (six) hours as needed for wheezing or shortness of breath.    [provider]  apixaban (ELIQUIS) 5 MG TABS tablet Take 5 mg by mouth 2 (two) times daily.    [provider]  ferrous sulfate 325 (65 FE) MG tablet Take 1 tablet (325 mg total) by mouth every Monday, Wednesday, and Friday. 01/31/23 01/31/24  Rana Snare, DO  furosemide (LASIX) 20 MG tablet Take 1 tablet (20 mg total) by mouth as needed. For swelling 03/05/23 06/03/23  O'Neal, Ronnald Ramp, MD  enoxaparin (LOVENOX) 40 MG/0.4ML injection Inject 0.4 mLs (40 mg total) into the skin daily. Start 3.14.19 Patient not taking: Reported on 04/30/2018 10/24/17 03/16/20  Glenna Fellows, MD       Allergies    Hydrocodone    Review of Systems   Review of Systems  Physical Exam Updated Vital Signs BP 133/87 (BP Location: Left Arm)   Pulse 86   Temp 98.7 F (37.1 C) (Oral)   Resp 20   SpO2 100%  Physical Exam Vitals and nursing note reviewed.  Constitutional:      General: She is not in acute distress.    Appearance: She is well-developed.  HENT:     Head: Normocephalic and atraumatic.     Right Ear: External ear normal.     Left Ear: External ear normal.     Nose: Nose normal.  Eyes:     Conjunctiva/sclera: Conjunctivae normal.  Cardiovascular:     Rate and Rhythm: Normal rate and regular rhythm.     Heart sounds: No murmur heard. Pulmonary:     Effort: No respiratory distress.     Breath sounds: No wheezing, rhonchi or rales.  Abdominal:     Palpations: Abdomen is soft.     Tenderness: There is no abdominal tenderness. There is no guarding or rebound.  Musculoskeletal:     Cervical back: Normal range of motion and neck supple.     Right lower leg: No tenderness. No edema.     Left lower leg: Tenderness present. No edema.  Skin:    General: Skin is warm and dry.  Findings: No rash.  Neurological:     General: No focal deficit present.     Mental Status: She is alert. Mental status is at baseline.     Motor: No weakness.  Psychiatric:        Mood and Affect: Mood normal.     ED Results / Procedures / Treatments   Labs (all labs ordered are listed, but only abnormal results are displayed) Labs Reviewed - No data to display  ED ECG REPORT   Date: 06/24/2023 0147  Rate: 63  Rhythm: normal sinus rhythm  QRS Axis: normal  Intervals: normal  ST/T Wave abnormalities: nonspecific T wave changes  Conduction Disutrbances:none  Narrative Interpretation:   Old EKG Reviewed: changes noted from 01/29/23 new Twave changes  I have personally reviewed the EKG tracing and agree with the computerized printout as noted.  Radiology DG Chest Port 1  View  Result Date: 06/24/2023 CLINICAL DATA:  Shortness of breath. EXAM: PORTABLE CHEST 1 VIEW COMPARISON:  Jan 12, 2023 FINDINGS: The heart size and mediastinal contours are within normal limits. Very mild atelectatic changes are noted within the bilateral lung bases. Mild amount of overlying breast attenuation artifact is also seen. No pleural effusion or pneumothorax is identified. Radiopaque surgical clips are seen overlying the bilateral upper quadrants. The visualized skeletal structures are unremarkable. IMPRESSION: Very mild bibasilar atelectasis. Electronically Signed   By: Aram Candela M.D.   On: 06/24/2023 02:59    Procedures Procedures    Medications Ordered in ED Medications - No data to display  ED Course/ Medical Decision Making/ A&P    Patient seen and examined. History obtained directly from patient. Work-up including labs, EKG performed earlier this morning were reviewed.    Labs/EKG: Independently reviewed and interpreted.  This included: CBC with normal white blood cell count, hemoglobin low at 8.5 near patient's baseline otherwise unremarkable; CMP creatinine 0.80, low protein and albumin otherwise unremarkable; pregnancy was negative; flu and COVID testing negative.  Due to concern for PE, troponin ordered.  Imaging: Ordered CT a of the chest to evaluate for possibility of PE, ordered left lower extremity DVT study.  Medications/Fluids: None ordered  Most recent vital signs reviewed and are as follows: BP 133/87 (BP Location: Left Arm)   Pulse 86   Temp 98.7 F (37.1 C) (Oral)   Resp 20   SpO2 100%   Initial impression: Patient appears comfortable, stable, vital signs are reassuring.  Regardless, patient would need to be restarted on Eliquis.  12:18 PM Reassessment performed. Patient appears stable.  Patient has been ambulating.   Labs personally reviewed and interpreted including: Troponin was normal  Imaging personally visualized and interpreted  including: CT angio of the chest, agree acute PE.  Per radiology report, no signs of heart strain or pulmonary infarct.  Reviewed DVT study results, negative for acute DVT.  Reviewed pertinent lab work and imaging with patient at bedside. Questions answered.   Most current vital signs reviewed and are as follows: BP 124/80 (BP Location: Right Arm)   Pulse (!) 57   Temp 98.2 F (36.8 C) (Oral)   Resp 17   SpO2 99%   Plan: Patient is low risk PESI score. Discussed with Dr. Fredderick Phenix.  Patient is comfortable with restarting her anticoagulation and with discharge to home.  She feels that she can pick up her medications.  I have spoken with on-call internal medicine resident to arrange outpatient follow-up.  They will contact patient with follow-up appointment.  Will  ensure no desaturation with ambulation.  1:15 PM Reassessment performed. Patient appears stable.  She ambulated with normal pulse ox.  She remains comfortable with discharge plan.  Reviewed pertinent lab work and imaging with patient at bedside. Questions answered.   Most current vital signs reviewed and are as follows: BP 124/80 (BP Location: Right Arm)   Pulse (!) 57   Temp 98.2 F (36.8 C) (Oral)   Resp 17   SpO2 99%   Plan: Discharge to home.   Prescriptions written for: Eliquis starter pack  Other home care instructions discussed: Rest, importance of medication compliance  ED return instructions discussed: Worsening shortness of breath, trouble breathing, fever, new symptoms or other concerns.  Follow-up instructions discussed: Patient encouraged to follow-up with their PCP in 3 days.                                    Medical Decision Making Amount and/or Complexity of Data Reviewed Radiology: ordered.  Risk Prescription drug management.   Patient with known history of DVT/PE, recent medication noncompliance, with new PE causing shortness of breath.  Also with left leg pain.  DVT study was negative.  Despite  new PE, patient looks very well.  Her vital signs are normal without hypoxia, tachycardia, tachypnea.  She ambulated without hypoxia.  Her troponin is normal.  No signs of heart strain on EKG or CT scan.  Low risk PESI score, class I.  Patient has appropriate outpatient follow-up.  I have discussed follow-up plan with her PCP today.  Patient is comfortable with discharge.  She is started on Eliquis again with starter pack, 10 mg twice daily x 1 week.  She seems reliable to return if her symptoms get worse.  The patient's vital signs, pertinent lab work and imaging were reviewed and interpreted as discussed in the ED course. Hospitalization was considered for further testing, treatments, or serial exams/observation. However as patient is well-appearing, has a stable exam, and reassuring studies today, I do not feel that they warrant admission at this time. This plan was discussed with the patient who verbalizes agreement and comfort with this plan and seems reliable and able to return to the Emergency Department with worsening or changing symptoms.          Final Clinical Impression(s) / ED Diagnoses Final diagnoses:  Multiple subsegmental pulmonary emboli without acute cor pulmonale (HCC)  Left leg pain    Rx / DC Orders ED Discharge Orders          Ordered    APIXABAN (ELIQUIS) VTE STARTER PACK (10MG  AND 5MG )       Note to Pharmacy: If starter pack unavailable, substitute with seventy-four 5 mg apixaban tabs following the above SIG directions.   06/24/23 1313              Renne Crigler, PA-C 06/24/23 1317    Rolan Bucco, MD 06/24/23 [phone]

## 2023-06-24 NOTE — ED Notes (Signed)
Patient called three times no response.

## 2023-06-24 NOTE — Discharge Instructions (Signed)
Please read and follow all provided instructions.  Your diagnoses today include:  1. Multiple subsegmental pulmonary emboli without acute cor pulmonale (HCC)   2. Left leg pain     Tests performed today include: An EKG of your heart A chest x-ray Cardiac enzymes - a blood test for heart muscle damage Blood counts and electrolytes Vital signs. See below for your results today.   Medications prescribed:  Eliquis: Anticoagulation medicine to treat blood clots  Take any prescribed medications only as directed.  Follow-up instructions: Please follow-up with your primary care provider this week for further evaluation and recheck of your symptoms.  Return instructions:  SEEK IMMEDIATE MEDICAL ATTENTION IF: You have severe chest pain, especially if the pain is crushing or pressure-like and spreads to the arms, back, neck, or jaw, or if you have sweating, nausea or vomiting, or trouble with breathing. THIS IS AN EMERGENCY. Do not wait to see if the pain will go away. Get medical help at once. Call 911. DO NOT drive yourself to the hospital.  Your chest pain gets worse and does not go away after a few minutes of rest.  You have an attack of chest pain lasting longer than what you usually experience.  You have significant dizziness, if you pass out, or have trouble walking.  You have chest pain not typical of your usual pain for which you originally saw your caregiver.  You have any other emergent concerns regarding your health.  Your vital signs today were: BP 124/80 (BP Location: Right Arm)   Pulse (!) 57   Temp 98.2 F (36.8 C) (Oral)   Resp 17   SpO2 99%  If your blood pressure (BP) was elevated above 135/85 this visit, please have this repeated by your doctor within one month. --------------

## 2023-06-24 NOTE — ED Triage Notes (Signed)
Pt having pain to LLE and believes she has a blood clot that has traveled to her lung because she is SOB. Pt has history of dvt. Pt on blood thinners that are prescribed daily but she only takes then every other day. SOB began two days ago along with chest pain.SOB worse with exertion. CP is central and intermittent.

## 2023-06-24 NOTE — ED Triage Notes (Signed)
She c/o shortness of breath without fever and with mild non-productive cough x 1 week. 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 She further tells me that she is having some pain in her left leg x 1 week.

## 2023-06-25 ENCOUNTER — Telehealth: Payer: Self-pay | Admitting: *Deleted

## 2023-06-25 NOTE — Telephone Encounter (Signed)
-----   Message from [place] sent at 06/25/2023  9:25 AM EST ----- Regarding: FW: Follow up in 1-2 weeks No available appts until the week of 07/09/23. We do have on cancellations this am.   Thanks,  [place] ----- Message ----- From: Morene Crocker, MD Sent: 06/24/2023  10:56 PM EST To: Imp Front Desk Pool Subject: Follow up in 1-2 weeks                         Hi team,  I received a call from ED physician that this patient was seen in the ED for leg clots and she needs d\follow up in our clinic. Could you please call her to make an appointment in the next week if possible?  Thank you so much, Morene Crocker

## 2023-06-25 NOTE — Telephone Encounter (Signed)
Pt was called - no answer. Unable to leave a message d/t "mailbox is full".

## 2023-07-21 ENCOUNTER — Other Ambulatory Visit: Payer: Self-pay

## 2023-07-21 ENCOUNTER — Emergency Department (HOSPITAL_BASED_OUTPATIENT_CLINIC_OR_DEPARTMENT_OTHER)
Admission: EM | Admit: 2023-07-21 | Discharge: 2023-07-22 | Disposition: A | Discharge status: Home / Self Care | Payer: No Typology Code available for payment source | Attending: Emergency Medicine | Admitting: Emergency Medicine

## 2023-07-21 DIAGNOSIS — Z7901 Long term (current) use of anticoagulants: Secondary | ICD-10-CM | POA: Diagnosis not present

## 2023-07-21 DIAGNOSIS — R0602 Shortness of breath: Secondary | ICD-10-CM | POA: Insufficient documentation

## 2023-07-21 DIAGNOSIS — R062 Wheezing: Secondary | ICD-10-CM | POA: Insufficient documentation

## 2023-07-21 MED ORDER — IPRATROPIUM-ALBUTEROL 0.5-2.5 (3) MG/3ML IN SOLN
3.0000 mL | Freq: Once | RESPIRATORY_TRACT | Status: AC
  Administered 2023-07-21: 3 mL via RESPIRATORY_TRACT
  Filled 2023-07-21: qty 3

## 2023-07-21 MED ORDER — DEXAMETHASONE SODIUM PHOSPHATE 10 MG/ML IJ SOLN
10.0000 mg | Freq: Once | INTRAMUSCULAR | Status: AC
  Administered 2023-07-21: 10 mg via INTRAMUSCULAR
  Filled 2023-07-21: qty 1

## 2023-07-21 MED ORDER — ALBUTEROL SULFATE (2.5 MG/3ML) 0.083% IN NEBU
2.5000 mg | INHALATION_SOLUTION | Freq: Once | RESPIRATORY_TRACT | Status: AC
  Administered 2023-07-21: 2.5 mg via RESPIRATORY_TRACT
  Filled 2023-07-21: qty 3

## 2023-07-21 MED ORDER — PREDNISONE 20 MG PO TABS
ORAL_TABLET | ORAL | 0 refills | Status: AC
Start: 2023-07-21 — End: ?

## 2023-07-21 NOTE — ED Notes (Signed)
RT assessed pt in triage for SOB. Pt respiratory status stable on RA w/BLBS insp whz no distress noted at this time. RT initiated 5/5 per protocol. RT will monitor while a MCGSO.    07/21/23 2251  Therapy Vitals  Resp (!) 24  Patient Position (if appropriate) Sitting  MEWS Score/Color  MEWS Score 1  MEWS Score Color Green  Respiratory Assessment  Assessment Type Assess only  Respiratory Pattern Labored;Regular  Chest Assessment Chest expansion symmetrical  Cough None  Bilateral Breath Sounds Inspiratory wheezes  R Upper  Breath Sounds Inspiratory wheezes  L Upper Breath Sounds Inspiratory wheezes  R Lower Breath Sounds Inspiratory wheezes  L Lower Breath Sounds Inspiratory wheezes  Oxygen Therapy/Pulse Ox  O2 Device Room Air  O2 Therapy Room air  SpO2 96 %

## 2023-07-21 NOTE — ED Triage Notes (Signed)
Pt POV reporting SOB. Hx asthma, was cleaning bathroom with multiple products around 5pm, thinks this may have triggered exacerbation.

## 2023-07-21 NOTE — ED Provider Notes (Signed)
[place] EMERGENCY DEPARTMENT AT [facility] Provider Note   CSN: [id number] Arrival date & time: 07/21/23  2243     History {Add pertinent medical, surgical, social history, OB history to HPI:1} Chief Complaint  Patient presents with   Shortness of Breath    Yesenia Wheeler is a 52 y.o. female.  The history is provided by the patient.  Shortness of Breath Severity:  Moderate Onset quality:  Sudden Timing:  Constant Progression:  Unchanged Chronicity:  New Context comment:  Mixed 2 chemicals to clean the toilet Relieved by:  Nothing Worsened by:  Nothing Ineffective treatments:  None tried Associated symptoms: wheezing   Associated symptoms: no abdominal pain and no fever   Risk factors: no recent alcohol use        Home Medications Prior to Admission medications   Medication Sig Start Date End Date Taking? Authorizing Provider  albuterol (VENTOLIN HFA) 108 (90 Base) MCG/ACT inhaler Inhale into the lungs every 6 (six) hours as needed for wheezing or shortness of breath.    [provider]  APIXABAN Everlene Balls) VTE STARTER PACK (10MG  AND 5MG ) Take as directed on package: start with two-5mg  tablets twice daily for 7 days. On day 8, switch to one-5mg  tablet twice daily. 06/24/23   Renne Crigler, PA-C  ferrous sulfate 325 (65 FE) MG tablet Take 1 tablet (325 mg total) by mouth every Monday, Wednesday, and Friday. 01/31/23 01/31/24  Rana Snare, DO  furosemide (LASIX) 20 MG tablet Take 1 tablet (20 mg total) by mouth as needed. For swelling 03/05/23 06/03/23  O'Neal, Ronnald Ramp, MD  enoxaparin (LOVENOX) 40 MG/0.4ML injection Inject 0.4 mLs (40 mg total) into the skin daily. Start 3.14.19 Patient not taking: Reported on 04/30/2018 10/24/17 03/16/20  Glenna Fellows, MD      Allergies    Hydrocodone    Review of Systems   Review of Systems  Constitutional:  Negative for fever.  HENT:  Negative for facial swelling.   Eyes:  Negative for redness.   Respiratory:  Positive for shortness of breath and wheezing.   Gastrointestinal:  Negative for abdominal pain.  All other systems reviewed and are negative.   Physical Exam Updated Vital Signs BP (!) 152/86 (BP Location: Left Arm)   Pulse 86   Temp 98.8 F (37.1 C)   Resp 20   Ht 5\' 4"  (1.626 m)   Wt 108.9 kg   SpO2 96%   BMI 41.20 kg/m  Physical Exam Vitals and nursing note reviewed.  Constitutional:      General: She is not in acute distress.    Appearance: She is well-developed.  HENT:     Head: Normocephalic and atraumatic.     Nose: Nose normal.  Eyes:     Pupils: Pupils are equal, round, and reactive to light.  Cardiovascular:     Rate and Rhythm: Normal rate and regular rhythm.     Pulses: Normal pulses.     Heart sounds: Normal heart sounds.  Pulmonary:     Effort: Pulmonary effort is normal. No respiratory distress.     Breath sounds: Wheezing present.  Abdominal:     General: Bowel sounds are normal. There is no distension.     Palpations: Abdomen is soft.     Tenderness: There is no abdominal tenderness. There is no guarding or rebound.  Genitourinary:    Vagina: No vaginal discharge.  Musculoskeletal:        General: Normal range of motion.  Cervical back: Neck supple.  Skin:    General: Skin is warm and dry.     Capillary Refill: Capillary refill takes less than 2 seconds.     Findings: No erythema or rash.  Neurological:     General: No focal deficit present.     Mental Status: She is oriented to person, place, and time.     Deep Tendon Reflexes: Reflexes normal.  Psychiatric:        Mood and Affect: Mood normal.     ED Results / Procedures / Treatments   Labs (all labs ordered are listed, but only abnormal results are displayed) Labs Reviewed - No data to display  EKG None  Radiology No results found.  Procedures Procedures  {Document cardiac monitor, telemetry assessment procedure when appropriate:1}  Medications Ordered in  ED Medications  ipratropium-albuterol (DUONEB) 0.5-2.5 (3) MG/3ML nebulizer solution 3 mL (3 mLs Nebulization Given 07/21/23 2312)  albuterol (PROVENTIL) (2.5 MG/3ML) 0.083% nebulizer solution 2.5 mg (2.5 mg Nebulization Given 07/21/23 2312)  dexamethasone (DECADRON) injection 10 mg (10 mg Intramuscular Given 07/21/23 2328)    ED Course/ Medical Decision Making/ A&P   {   Click here for ABCD2, HEART and other calculatorsREFRESH Note before signing :1}                              Medical Decision Making Risk Prescription drug management.    Final Clinical Impression(s) / ED Diagnoses Final diagnoses:  None    Rx / DC Orders ED Discharge Orders     None

## 2023-07-22 NOTE — ED Notes (Signed)
Discharge instructions regarding home care of SOB were reviewed. Pt was instructed to follow up with PCP and to return with any new or worsening symptoms.

## 2023-07-26 ENCOUNTER — Other Ambulatory Visit: Payer: Self-pay

## 2023-07-26 ENCOUNTER — Emergency Department (HOSPITAL_BASED_OUTPATIENT_CLINIC_OR_DEPARTMENT_OTHER)
Admission: EM | Admit: 2023-07-26 | Discharge: 2023-07-26 | Disposition: A | Discharge status: Home / Self Care | Payer: No Typology Code available for payment source | Attending: Emergency Medicine | Admitting: Emergency Medicine

## 2023-07-26 DIAGNOSIS — R062 Wheezing: Secondary | ICD-10-CM | POA: Insufficient documentation

## 2023-07-26 DIAGNOSIS — Z7952 Long term (current) use of systemic steroids: Secondary | ICD-10-CM | POA: Insufficient documentation

## 2023-07-26 DIAGNOSIS — R0602 Shortness of breath: Secondary | ICD-10-CM | POA: Diagnosis present

## 2023-07-26 MED ORDER — ALBUTEROL SULFATE (2.5 MG/3ML) 0.083% IN NEBU
2.5000 mg | INHALATION_SOLUTION | Freq: Once | RESPIRATORY_TRACT | Status: AC
  Administered 2023-07-26: 2.5 mg via RESPIRATORY_TRACT
  Filled 2023-07-26: qty 3

## 2023-07-26 MED ORDER — IPRATROPIUM-ALBUTEROL 0.5-2.5 (3) MG/3ML IN SOLN
3.0000 mL | Freq: Once | RESPIRATORY_TRACT | Status: AC
  Administered 2023-07-26: 3 mL via RESPIRATORY_TRACT
  Filled 2023-07-26: qty 3

## 2023-07-26 MED ORDER — ALBUTEROL SULFATE (2.5 MG/3ML) 0.083% IN NEBU: 2.5000 mg | INHALATION_SOLUTION | Freq: Four times a day (QID) | RESPIRATORY_TRACT | 12 refills | Status: AC | PRN

## 2023-07-26 MED ORDER — ALBUTEROL SULFATE HFA 108 (90 BASE) MCG/ACT IN AERS
1.0000 | INHALATION_SPRAY | Freq: Four times a day (QID) | RESPIRATORY_TRACT | 0 refills | Status: AC | PRN
Start: 2023-07-26 — End: ?

## 2023-07-26 MED ORDER — ALBUTEROL SULFATE HFA 108 (90 BASE) MCG/ACT IN AERS
2.0000 | INHALATION_SPRAY | Freq: Four times a day (QID) | RESPIRATORY_TRACT | Status: DC | PRN
  Administered 2023-07-26: 2 via RESPIRATORY_TRACT
  Filled 2023-07-26: qty 6.7

## 2023-07-26 MED ORDER — AEROCHAMBER PLUS FLO-VU SMALL MISC
1.0000 | Freq: Once | Status: AC
  Administered 2023-07-26: 1
  Filled 2023-07-26: qty 1

## 2023-07-26 NOTE — ED Notes (Signed)
RT Note: Patient educated on the use of an Albuterol  inhaler with a spacer for home use

## 2023-07-26 NOTE — Discharge Instructions (Signed)
As we discussed, this is likely a reaction from the Lysol and bleach that you inhaled several days ago.  I have given you an albuterol inhaler and nebulizer solution that you go pick up at your pharmacy.  Would like for you to follow-up with your primary care doctor for further evaluation.  Return for any of the red flag symptoms we discussed today.

## 2023-07-26 NOTE — ED Triage Notes (Signed)
Seen Saturday after exposure to mixed cleaners at home [facility]). Hx asthma. Sent home with steroids. Does not have albuterol at home. Continues to experience wheezing and SOB.

## 2023-07-26 NOTE — ED Provider Notes (Signed)
[place] EMERGENCY DEPARTMENT AT [facility] Provider Note   CSN: [id number] Arrival date & time: 07/26/23  1621     History Chief Complaint  Patient presents with   Shortness of Breath    Yesenia Wheeler is a 52 y.o. female with history of asthma who presents to the emergency department today for further evaluation of shortness of breath is been ongoing since 07/21/2023.  Patient was seen evaluated here at that time after an exposure to Lysol and bleach.  Patient does have a history of asthma but does not have any albuterol treatments at home.  Chart review does reveal that the patient was seen evaluated here and ultimately discharged with prednisone.  She states that she is on her last day of prednisone today and feels no better.  She denies fever, chills, abdominal pain, nausea, vomiting, diarrhea.   Shortness of Breath      Home Medications Wheeler to Admission medications   Medication Sig Start Date End Date Taking? Authorizing Provider  albuterol (PROVENTIL) (2.5 MG/3ML) 0.083% nebulizer solution Take 3 mLs (2.5 mg total) by nebulization every 6 (six) hours as needed for wheezing or shortness of breath. 07/26/23  Yes Meredeth Ide, Douglas Rooks M, PA-C  albuterol (VENTOLIN HFA) 108 (90 Base) MCG/ACT inhaler Inhale 1-2 puffs into the lungs every 6 (six) hours as needed for wheezing or shortness of breath. 07/26/23  Yes Meredeth Ide, Tanith Dagostino M, PA-C  APIXABAN Everlene Balls) VTE STARTER PACK (10MG  AND 5MG ) Take as directed on package: start with two-5mg  tablets twice daily for 7 days. On day 8, switch to one-5mg  tablet twice daily. 06/24/23   Renne Crigler, PA-C  ferrous sulfate 325 (65 FE) MG tablet Take 1 tablet (325 mg total) by mouth every Monday, Wednesday, and Friday. 01/31/23 01/31/24  Rana Snare, DO  furosemide (LASIX) 20 MG tablet Take 1 tablet (20 mg total) by mouth as needed. For swelling 03/05/23 06/03/23  O'Neal, Ronnald Ramp, MD  predniSONE (DELTASONE) 20 MG tablet 3 tabs po  day one, then 2 po daily x 4 days 07/21/23   Palumbo, April, MD  enoxaparin (LOVENOX) 40 MG/0.4ML injection Inject 0.4 mLs (40 mg total) into the skin daily. Start 3.14.19 Patient not taking: Reported on 04/30/2018 10/24/17 03/16/20  Glenna Fellows, MD      Allergies    Hydrocodone    Review of Systems   Review of Systems  Respiratory:  Positive for shortness of breath.   All other systems reviewed and are negative.   Physical Exam Updated Vital Signs BP (!) 155/82   Pulse (!) 102   Temp 97.9 F (36.6 C) (Oral)   Resp 17   SpO2 100%  Physical Exam Vitals and nursing note reviewed.  Constitutional:      General: She is not in acute distress.    Appearance: Normal appearance.  HENT:     Head: Normocephalic and atraumatic.  Eyes:     General:        Right eye: No discharge.        Left eye: No discharge.  Cardiovascular:     Comments: Regular rate and rhythm.  S1/S2 are distinct without any evidence of murmur, rubs, or gallops.  Radial pulses are 2+ bilaterally.  Dorsalis pedis pulses are 2+ bilaterally.  No evidence of pedal edema. Pulmonary:     Comments: Normal effort.  Decreased breath sounds diffusely with some scant expiratory wheezing.  No respiratory distress. Abdominal:     General: Abdomen is flat. Bowel sounds are normal.  There is no distension.     Tenderness: There is no abdominal tenderness. There is no guarding or rebound.  Musculoskeletal:        General: Normal range of motion.     Cervical back: Neck supple.  Skin:    General: Skin is warm and dry.     Findings: No rash.  Neurological:     General: No focal deficit present.     Mental Status: She is alert.  Psychiatric:        Mood and Affect: Mood normal.        Behavior: Behavior normal.     ED Results / Procedures / Treatments   Labs (all labs ordered are listed, but only abnormal results are displayed) Labs Reviewed - No data to display  EKG None  Radiology No results  found.  Procedures Procedures    Medications Ordered in ED Medications  albuterol (VENTOLIN HFA) 108 (90 Base) MCG/ACT inhaler 2 puff (2 puffs Inhalation Provided for home use 07/26/23 1716)  ipratropium-albuterol (DUONEB) 0.5-2.5 (3) MG/3ML nebulizer solution 3 mL (3 mLs Nebulization Given 07/26/23 1635)  albuterol (PROVENTIL) (2.5 MG/3ML) 0.083% nebulizer solution 2.5 mg (2.5 mg Nebulization Given 07/26/23 1639)  AeroChamber Plus Flo-Vu Small device MISC 1 each (1 each Other Provided for home use 07/26/23 1716)    ED Course/ Medical Decision Making/ A&P   {   Click here for ABCD2, HEART and other calculators  Medical Decision Making Yesenia Wheeler is a 52 y.o. female patient who presents to the emergency department today for further evaluation of shortness of breath and wheezing.  Patient does have some decreased breath sounds diffusely and some scant wheezing.  Will plan to give her DuoNeb and plan to reassess.  Patient already on steroids.  She is in no respiratory distress at this time.  Patient is doing much better after DuoNeb.  I have given her prescription for albuterol inhaler and nebulizer solution.  She will follow-up with her primary care doctor for further evaluation.  Lungs sound more clear.  She is less tight and has less wheezing.  Strict turn precautions were discussed.  She is safe for discharge.  Risk Prescription drug management.    Final Clinical Impression(s) / ED Diagnoses Final diagnoses:  Wheezing    Rx / DC Orders ED Discharge Orders          Ordered    albuterol (PROVENTIL) (2.5 MG/3ML) 0.083% nebulizer solution  Every 6 hours PRN        07/26/23 1735    albuterol (VENTOLIN HFA) 108 (90 Base) MCG/ACT inhaler  Every 6 hours PRN        07/26/23 1735              Teressa Lower, PA-C 07/26/23 1737    Rondel Baton, MD 07/28/23 1324

## 2023-11-05 ENCOUNTER — Encounter (HOSPITAL_COMMUNITY): Payer: Self-pay

## 2023-11-05 ENCOUNTER — Ambulatory Visit (HOSPITAL_COMMUNITY)
Admission: EM | Admit: 2023-11-05 | Discharge: 2023-11-05 | Disposition: A | Discharge status: Home / Self Care | Attending: Family Medicine | Admitting: Family Medicine

## 2023-11-05 ENCOUNTER — Telehealth (HOSPITAL_COMMUNITY): Payer: Self-pay

## 2023-11-05 DIAGNOSIS — R21 Rash and other nonspecific skin eruption: Secondary | ICD-10-CM | POA: Insufficient documentation

## 2023-11-05 DIAGNOSIS — N898 Other specified noninflammatory disorders of vagina: Secondary | ICD-10-CM | POA: Diagnosis not present

## 2023-11-05 MED ORDER — CLOTRIMAZOLE-BETAMETHASONE 1-0.05 % EX CREA: TOPICAL_CREAM | CUTANEOUS | 0 refills | Status: AC

## 2023-11-05 MED ORDER — CLOTRIMAZOLE-BETAMETHASONE 1-0.05 % EX CREA: TOPICAL_CREAM | CUTANEOUS | 0 refills | Status: DC

## 2023-11-05 NOTE — Discharge Instructions (Signed)
 You were seen today for a rash and vaginal irritation.  I have sent out a cream to use to the thigh rash.  Don't use this at the vaginal area just yet.  Your vaginal swab will be resulted tomorrow.  You will see this on mychart and you will be notified of any results we may need to treat.  If you continue with symptoms despite treatment then please return for re-evaluation.

## 2023-11-05 NOTE — ED Provider Notes (Signed)
 [facility]    CSN: [id number] Arrival date & time: 11/05/23  7829      History   Chief Complaint Chief Complaint  Patient presents with   Vaginal Itching    HPI Yesenia Wheeler is a 53 y.o. female.    Patient is here for rash/vaginal irritation.  She started with a rash/irritation to the left inner thigh several days ago. Used an antibacterial soap, and then use peroxide and a steroid cream.   The rash has gotten more irritated, and now noting irritation to the vaginal area with odor.  No risk of STD.       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 , complex sclerosing lesion   Migraines    Mild asthma    OA (osteoarthritis)    bilateral knees   Parsonage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    SBO (small bowel obstruction) (HCC) 09/2020    Patient Active Problem List   Diagnosis Date Noted   Acute deep vein thrombosis (DVT) of right peroneal vein (HCC) 01/30/2023   Pulmonary embolism (HCC) 01/29/2023   Pre-op evaluation 12/26/2021   Knee pain 10/29/2021   Iron deficiency anemia 01/13/2021   Thyroid nodule 01/13/2021   Depression 01/13/2021   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   DVT, lower extremity, 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   Left breast mass 10/23/2017   Acute medial meniscus tear of left knee 08/30/2017   Obesity (BMI 30-39.9) 05/26/2014   Asthma, chronic 05/25/2014   Migraine 05/25/2014   Chronic back pain 05/25/2014    Past Surgical History:  Procedure Laterality Date   BREAST LUMPECTOMY WITH RADIOACTIVE SEED LOCALIZATION Left 10/23/2017   Procedure: BREAST LUMPECTOMY WITH RADIOACTIVE SEED LOCALIZATION;  Surgeon: Harriette Bouillon, MD;  Location: [facility];  Service: General;  Laterality:  Left;   BREAST REDUCTION SURGERY Bilateral 10/23/2017   Procedure: LEFT BREAST ONCOPLASTIC REDUCTION, RIGHT BREAST REDUCTION;  Surgeon: Glenna Fellows, MD;  Location: [facility];  Service: Plastics;  Laterality: Bilateral;   CESAREAN SECTION  2011   GASTRIC BYPASS  2001   KNEE ARTHROSCOPY W/ MENISCECTOMY Right 2007   KNEE ARTHROSCOPY WITH MEDIAL MENISECTOMY Left 08/30/2017   Procedure: KNEE ARTHROSCOPY WITH PARTIAL MEDIAL MENISECTOMY;  Surgeon: Yolonda Kida, MD;  Location: [facility];  Service: Orthopedics;  Laterality: Left;  60 mins   LAPAROSCOPIC CHOLECYSTECTOMY  2001   SHOULDER ARTHROSCOPY Left 2008   spur    OB History     Gravida  14   Para      Term      Preterm      AB  14   Living  3      SAB  13   IAB      Ectopic      Multiple      Live Births               Home Medications    Prior to Admission medications   Medication Sig Start Date End Date Taking? Authorizing Provider  clotrimazole-betamethasone (LOTRISONE) cream Apply to affected area 2 times daily prn 11/05/23  Yes Othal Kubitz, MD  albuterol (PROVENTIL) (2.5 MG/3ML) 0.083% nebulizer solution  Take 3 mLs (2.5 mg total) by nebulization every 6 (six) hours as needed for wheezing or shortness of breath. 07/26/23  Yes Meredeth Ide, Conner M, PA-C  albuterol (VENTOLIN HFA) 108 (90 Base) MCG/ACT inhaler Inhale 1-2 puffs into the lungs every 6 (six) hours as needed for wheezing or shortness of breath. 07/26/23  Yes Meredeth Ide, Conner M, PA-C  APIXABAN Everlene Balls) VTE STARTER PACK (10MG  AND 5MG ) Take as directed on package: start with two-5mg  tablets twice daily for 7 days. On day 8, switch to one-5mg  tablet twice daily. 06/24/23  Yes Renne Crigler, PA-C  ferrous sulfate 325 (65 FE) MG tablet Take 1 tablet (325 mg total) by mouth every Monday, Wednesday, and Friday. 01/31/23 01/31/24 Yes Rana Snare, DO  furosemide (LASIX) 20 MG tablet Take 1 tablet (20 mg total) by  mouth as needed. For swelling 03/05/23 06/03/23  O'Neal, Ronnald Ramp, MD  predniSONE (DELTASONE) 20 MG tablet 3 tabs po day one, then 2 po daily x 4 days 07/21/23  Yes Palumbo, April, MD  enoxaparin (LOVENOX) 40 MG/0.4ML injection Inject 0.4 mLs (40 mg total) into the skin daily. Start 3.14.19 Patient not taking: Reported on 04/30/2018 10/24/17 03/16/20  Glenna Fellows, MD    Family History Family History  Problem Relation Age of Onset   Asthma Mother    Cancer Father        oral    Social History Social History   Tobacco Use   Smoking status: Former    Current packs/day: 0.00    Types: Cigarettes    Start date: 06/24/2011    Quit date: 06/23/2017    Years since quitting: 6.3    Passive exposure: Past   Smokeless tobacco: Never   Tobacco comments:    2ppwk  Vaping Use   Vaping status: Every Day   Substances: Nicotine, Flavoring  Substance Use Topics   Alcohol use: Not Currently    Comment: occasional   Drug use: No     Allergies   Hydrocodone   Review of Systems Review of Systems  Constitutional: Negative.   HENT: Negative.    Respiratory: Negative.    Cardiovascular: Negative.   Gastrointestinal: Negative.   Genitourinary:  Positive for vaginal discharge.  Skin:  Positive for rash.  Hematological: Negative.   Psychiatric/Behavioral: Negative.       Physical Exam Triage Vital Signs ED Triage Vitals  Encounter Vitals Group     BP 11/05/23 1003 120/78     Systolic BP Percentile --      Diastolic BP Percentile --      Pulse Rate 11/05/23 1003 91     Resp 11/05/23 1003 16     Temp 11/05/23 1003 98.5 F (36.9 C)     Temp Source 11/05/23 1003 Oral     SpO2 11/05/23 1003 93 %     Weight --      Height --      Head Circumference --      Peak Flow --      Pain Score 11/05/23 1005 0     Pain Loc --      Pain Education --      Exclude from Growth Chart --    No data found.  Updated Vital Signs BP 120/78 (BP Location: Left Arm)   Pulse 91   Temp  98.5 F (36.9 C) (Oral)   Resp 16   LMP 10/24/2023 (Approximate)   SpO2 93%   Visual Acuity Right Eye Distance:   Left Eye  Distance:   Bilateral Distance:    Right Eye Near:   Left Eye Near:    Bilateral Near:     Physical Exam Constitutional:      Appearance: Normal appearance. She is normal weight.  Genitourinary:    General: Normal vulva.     Vagina: No vaginal discharge.  Skin:    Comments: To the left inner upper thigh is a large area of dry skin;  no drainage or tenderness noted;   Neurological:     General: No focal deficit present.     Mental Status: She is alert.  Psychiatric:        Mood and Affect: Mood normal.      UC Treatments / Results  Labs (all labs ordered are listed, but only abnormal results are displayed) Labs Reviewed  CERVICOVAGINAL ANCILLARY ONLY    EKG   Radiology No results found.  Procedures Procedures (including critical care time)  Medications Ordered in UC Medications - No data to display  Initial Impression / Assessment and Plan / UC Course  I have reviewed the triage vital signs and the nursing notes.  Pertinent labs & imaging results that were available during my care of the patient were reviewed by me and considered in my medical decision making (see chart for details).    Final Clinical Impressions(s) / UC Diagnoses   Final diagnoses:  Vaginal odor  Rash and nonspecific skin eruption     Discharge Instructions      You were seen today for a rash and vaginal irritation.  I have sent out a cream to use to the thigh rash.  Don't use this at the vaginal area just yet.  Your vaginal swab will be resulted tomorrow.  You will see this on mychart and you will be notified of any results we may need to treat.  If you continue with symptoms despite treatment then please return for re-evaluation.      ED Prescriptions     Medication Sig Dispense Auth. Provider   clotrimazole-betamethasone (LOTRISONE) cream Apply to  affected area 2 times daily prn 15 g Jannifer Franklin, MD      PDMP not reviewed this encounter.   Jannifer Franklin, MD 11/05/23 1023

## 2023-11-05 NOTE — ED Triage Notes (Signed)
 Patient presents to office for vaginal irritation and itching. Patient states she has skin rash between her legs.

## 2023-11-07 ENCOUNTER — Telehealth (HOSPITAL_COMMUNITY): Payer: Self-pay

## 2023-11-07 LAB — CERVICOVAGINAL ANCILLARY ONLY
Bacterial Vaginitis (gardnerella): POSITIVE — AB
Candida Glabrata: NEGATIVE
Candida Vaginitis: NEGATIVE
Chlamydia: NEGATIVE
Comment: NEGATIVE
Comment: NEGATIVE
Comment: NEGATIVE
Comment: NEGATIVE
Comment: NEGATIVE
Comment: NORMAL
Neisseria Gonorrhea: NEGATIVE
Trichomonas: NEGATIVE

## 2023-11-07 MED ORDER — METRONIDAZOLE 500 MG PO TABS: 500.0000 mg | ORAL_TABLET | Freq: Two times a day (BID) | ORAL | 0 refills | Status: AC

## 2023-11-07 NOTE — Telephone Encounter (Signed)
 Per protocol, pt requires tx with metronidazole. Rx sent to pharmacy on file.

## 2024-09-02 ENCOUNTER — Emergency Department (HOSPITAL_BASED_OUTPATIENT_CLINIC_OR_DEPARTMENT_OTHER)

## 2024-09-02 ENCOUNTER — Emergency Department (HOSPITAL_COMMUNITY)

## 2024-09-02 ENCOUNTER — Emergency Department (HOSPITAL_BASED_OUTPATIENT_CLINIC_OR_DEPARTMENT_OTHER)
Admission: EM | Admit: 2024-09-02 | Discharge: 2024-09-02 | Disposition: A | Discharge status: Home / Self Care | Attending: Emergency Medicine | Admitting: Emergency Medicine

## 2024-09-02 ENCOUNTER — Other Ambulatory Visit: Payer: Self-pay

## 2024-09-02 DIAGNOSIS — I2699 Other pulmonary embolism without acute cor pulmonale: Secondary | ICD-10-CM | POA: Diagnosis not present

## 2024-09-02 DIAGNOSIS — R29898 Other symptoms and signs involving the musculoskeletal system: Secondary | ICD-10-CM

## 2024-09-02 DIAGNOSIS — M25511 Pain in right shoulder: Secondary | ICD-10-CM | POA: Diagnosis not present

## 2024-09-02 DIAGNOSIS — R202 Paresthesia of skin: Secondary | ICD-10-CM | POA: Diagnosis not present

## 2024-09-02 DIAGNOSIS — Z7401 Bed confinement status: Secondary | ICD-10-CM | POA: Diagnosis not present

## 2024-09-02 DIAGNOSIS — J45909 Unspecified asthma, uncomplicated: Secondary | ICD-10-CM | POA: Diagnosis not present

## 2024-09-02 DIAGNOSIS — D649 Anemia, unspecified: Secondary | ICD-10-CM | POA: Diagnosis not present

## 2024-09-02 DIAGNOSIS — Z7901 Long term (current) use of anticoagulants: Secondary | ICD-10-CM | POA: Diagnosis not present

## 2024-09-02 DIAGNOSIS — R200 Anesthesia of skin: Secondary | ICD-10-CM | POA: Insufficient documentation

## 2024-09-02 DIAGNOSIS — R519 Headache, unspecified: Secondary | ICD-10-CM

## 2024-09-02 DIAGNOSIS — R531 Weakness: Secondary | ICD-10-CM | POA: Diagnosis not present

## 2024-09-02 LAB — COMPREHENSIVE METABOLIC PANEL WITH GFR
ALT: 12 U/L (ref 0–44)
AST: 24 U/L (ref 15–41)
Albumin: 4.2 g/dL (ref 3.5–5.0)
Alkaline Phosphatase: 141 U/L — ABNORMAL HIGH (ref 38–126)
Anion gap: 13 (ref 5–15)
BUN: 10 mg/dL (ref 6–20)
CO2: 21 mmol/L — ABNORMAL LOW (ref 22–32)
Calcium: 9.2 mg/dL (ref 8.9–10.3)
Chloride: 107 mmol/L (ref 98–111)
Creatinine, Ser: 0.89 mg/dL (ref 0.44–1.00)
GFR, Estimated: >60 mL/min
Glucose, Bld: 85 mg/dL (ref 70–99)
Potassium: 3.4 mmol/L — ABNORMAL LOW (ref 3.5–5.1)
Sodium: 140 mmol/L (ref 135–145)
Total Bilirubin: 0.5 mg/dL (ref 0.0–1.2)
Total Protein: 7.6 g/dL (ref 6.5–8.1)

## 2024-09-02 LAB — CBC
HCT: 28.2 % — ABNORMAL LOW (ref 36.0–46.0)
Hemoglobin: 8.6 g/dL — ABNORMAL LOW (ref 12.0–15.0)
MCH: 22 pg — ABNORMAL LOW (ref 26.0–34.0)
MCHC: 30.5 g/dL (ref 30.0–36.0)
MCV: 72.1 fL — ABNORMAL LOW (ref 80.0–100.0)
Platelets: 354 K/uL (ref 150–400)
RBC: 3.91 MIL/uL (ref 3.87–5.11)
RDW: 18.9 % — ABNORMAL HIGH (ref 11.5–15.5)
WBC: 6.7 K/uL (ref 4.0–10.5)
nRBC: 0 % (ref 0.0–0.2)

## 2024-09-02 LAB — DIFFERENTIAL
Abs Immature Granulocytes: 0.01 K/uL (ref 0.00–0.07)
Basophils Absolute: 0.1 K/uL (ref 0.0–0.1)
Basophils Relative: 1 %
Eosinophils Absolute: 0.2 K/uL (ref 0.0–0.5)
Eosinophils Relative: 3 %
Immature Granulocytes: 0 %
Lymphocytes Relative: 33 %
Lymphs Abs: 2.2 K/uL (ref 0.7–4.0)
Monocytes Absolute: 0.4 K/uL (ref 0.1–1.0)
Monocytes Relative: 6 %
Neutro Abs: 3.9 K/uL (ref 1.7–7.7)
Neutrophils Relative %: 57 %

## 2024-09-02 LAB — PROTIME-INR
INR: 1 (ref 0.8–1.2)
Prothrombin Time: 13.9 s (ref 11.4–15.2)

## 2024-09-02 LAB — APTT: aPTT: 29 s (ref 24–36)

## 2024-09-02 LAB — CBG MONITORING, ED: Glucose-Capillary: 113 mg/dL — ABNORMAL HIGH (ref 70–99)

## 2024-09-02 MED ORDER — METOCLOPRAMIDE HCL 5 MG/ML IJ SOLN
5.0000 mg | Freq: Once | INTRAMUSCULAR | Status: AC
  Administered 2024-09-02: 5 mg via INTRAVENOUS
  Filled 2024-09-02: qty 2

## 2024-09-02 MED ORDER — SODIUM CHLORIDE 0.9% FLUSH
3.0000 mL | Freq: Once | INTRAVENOUS | Status: AC
  Administered 2024-09-02: 3 mL via INTRAVENOUS

## 2024-09-02 MED ORDER — DIPHENHYDRAMINE HCL 50 MG/ML IJ SOLN
12.5000 mg | Freq: Once | INTRAMUSCULAR | Status: AC
  Administered 2024-09-02: 12.5 mg via INTRAVENOUS
  Filled 2024-09-02: qty 1

## 2024-09-02 MED ORDER — KETOROLAC TROMETHAMINE 15 MG/ML IJ SOLN
15.0000 mg | Freq: Once | INTRAMUSCULAR | Status: AC
  Administered 2024-09-02: 15 mg via INTRAVENOUS
  Filled 2024-09-02: qty 1

## 2024-09-02 MED ORDER — DEXAMETHASONE SOD PHOSPHATE PF 10 MG/ML IJ SOLN
10.0000 mg | Freq: Once | INTRAMUSCULAR | Status: AC
  Administered 2024-09-02: 10 mg via INTRAVENOUS
  Filled 2024-09-02: qty 1

## 2024-09-02 MED ORDER — OXYCODONE-ACETAMINOPHEN 5-325 MG PO TABS: 1.0000 | ORAL_TABLET | Freq: Four times a day (QID) | ORAL | 0 refills | Status: AC | PRN

## 2024-09-02 NOTE — ED Triage Notes (Signed)
 Pt was BIB [facility] from [facility] for a MRI. She had complaints of R sided numbness for a couple months that has progressively gotten worse. She has a HX of turners syndrome and this has happened in the past per [facility].

## 2024-09-02 NOTE — ED Triage Notes (Signed)
 Reports R sided numbness and tingling x 2 months. Reports unable to move R arm since Sunday d/t pain and weakness. No other unilateral deficits. Similar episode to Left side 2 years ago. Dx w/ Parsonage-Turner Syndrome.

## 2024-09-02 NOTE — Discharge Instructions (Addendum)
 MRI was overall reassuring.  As discussed there is a small thyroid  nodule noted, it is recommended to follow-up with your primary care for an ultrasound.  There is also some disc protrusion found.  I provided you with a neurology follow-up and want you to call them if you do not hear from them within the next 24 to 48 hours.  I will provide you with some pain medication.  Please use Tylenol  and ibuprofen  prior to using pain medication.  If any concerning new or worsening symptoms occur please return to the emergency department for further evaluation.

## 2024-09-02 NOTE — ED Notes (Signed)
 Called Yesenia Wheeler at [facility] for transport. Dr. Jerrol accepting

## 2024-09-02 NOTE — ED Provider Notes (Signed)
 " [place] EMERGENCY DEPARTMENT AT [facility] Provider Note   CSN: [id number] Arrival date & time: 09/02/24  1008     Patient presents with: Extremity Weakness   Yesenia Wheeler is a 54 y.o. female patient with past medical history of PE on Eliquis , asthma, migraine, Parsonage Turner syndrome presenting with complain of right sided numbness, tingling and weakness. Reports this started three days ago with intermittent and severe pain to right scapular and right armpit area radiating down right arm. Today right arm has become weakness and difficult to lift, pain dose limit some of this. Also over three days noticed gradual progression of right leg numbness and heaviness, feels like she is dragging her right foot. Has history of migraine, currently has migraine and reports nothing really helps with them. Also reports that this feels very similar to Parsonage Turner Syndrome in the past.    Extremity Weakness       Prior to Admission medications  Medication Sig Start Date End Date Taking? Authorizing Provider  albuterol  (PROVENTIL ) (2.5 MG/3ML) 0.083% nebulizer solution Take 3 mLs (2.5 mg total) by nebulization every 6 (six) hours as needed for wheezing or shortness of breath. 07/26/23   Theotis Cameron HERO, PA-C  albuterol  (VENTOLIN  HFA) 108 (90 Base) MCG/ACT inhaler Inhale 1-2 puffs into the lungs every 6 (six) hours as needed for wheezing or shortness of breath. 07/26/23   Theotis Cameron M, PA-C  APIXABAN  (ELIQUIS ) VTE STARTER PACK (10MG  AND 5MG ) Take as directed on package: start with two-5mg  tablets twice daily for 7 days. On day 8, switch to one-5mg  tablet twice daily. 06/24/23   Geiple, Joshua, PA-C  clotrimazole -betamethasone  (LOTRISONE ) cream Apply to affected area 2 times daily prn 11/05/23   Darral Longs, MD  ferrous sulfate  325 (65 FE) MG tablet Take 1 tablet (325 mg total) by mouth every Monday, Wednesday, and Friday. 01/31/23 01/31/24  Zheng, Michael, DO  furosemide   (LASIX ) 20 MG tablet Take 1 tablet (20 mg total) by mouth as needed. For swelling 03/05/23 06/03/23  O'Neal, Darryle Ned, MD  predniSONE  (DELTASONE ) 20 MG tablet 3 tabs po day one, then 2 po daily x 4 days 07/21/23   Palumbo, April, MD  enoxaparin  (LOVENOX ) 40 MG/0.4ML injection Inject 0.4 mLs (40 mg total) into the skin daily. Start 3.14.19 Patient not taking: Reported on 04/30/2018 10/24/17 03/16/20  Arelia Filippo, MD    Allergies: Hydrocodone     Review of Systems  Musculoskeletal:  Positive for extremity weakness.  Neurological:  Positive for weakness.    Updated Vital Signs BP 108/85   Pulse 100   Temp 98.1 F (36.7 C)   Resp 20   SpO2 100%   Physical Exam Vitals and nursing note reviewed.  Constitutional:      General: She is not in acute distress.    Appearance: She is not toxic-appearing.  HENT:     Head: Normocephalic and atraumatic.  Eyes:     General: No scleral icterus.    Conjunctiva/sclera: Conjunctivae normal.  Neck:     Comments: Pain to right side of neck, no mid line tenderness.  Cardiovascular:     Rate and Rhythm: Normal rate and regular rhythm.     Pulses: Normal pulses.     Heart sounds: Normal heart sounds.  Pulmonary:     Effort: Pulmonary effort is normal. No respiratory distress.     Breath sounds: Normal breath sounds.  Abdominal:     General: Abdomen is flat. Bowel sounds are normal.  Palpations: Abdomen is soft.     Tenderness: There is no abdominal tenderness.  Musculoskeletal:     Comments: Pain to right scapula, right anterior shoulder.   Skin:    General: Skin is warm and dry.     Findings: No lesion.  Neurological:     General: No focal deficit present.     Mental Status: She is alert and oriented to person, place, and time. Mental status is at baseline.     Comments: Patient alert and oriented answering questions appropriately with no slurred speech.  No nystagmus.  No visual field deficit.  Facial sensation intact. Right arm  weakness, bilateral sensation intact.  Strong radial pulse. Left leg 5/5 strength, sensation intact.  Right leg 4/5 strength w/ leg raise, plantar flexion, dorsiflexion. Decreased sensation to anterior shin and foot. Strong DP pulse.      (all labs ordered are listed, but only abnormal results are displayed) Labs Reviewed  CBC - Abnormal; Notable for the following components:      Result Value   Hemoglobin 8.6 (*)    HCT 28.2 (*)    MCV 72.1 (*)    MCH 22.0 (*)    RDW 18.9 (*)    All other components within normal limits  COMPREHENSIVE METABOLIC PANEL WITH GFR - Abnormal; Notable for the following components:   Potassium 3.4 (*)    CO2 21 (*)    Alkaline Phosphatase 141 (*)    All other components within normal limits  CBG MONITORING, ED - Abnormal; Notable for the following components:   Glucose-Capillary 113 (*)    All other components within normal limits  PROTIME-INR  APTT  DIFFERENTIAL  ETHANOL    EKG: None  Radiology: CT HEAD WO CONTRAST Result Date: 09/02/2024 EXAM: CT HEAD WITHOUT CONTRAST 09/02/2024 10:36:42 AM TECHNIQUE: CT of the head was performed without the administration of intravenous contrast. Automated exposure control, iterative reconstruction, and/or weight based adjustment of the mA/kV was utilized to reduce the radiation dose to as low as reasonably achievable. COMPARISON: CT head 12/30/2020 and brain MRI 12/30/2020. CLINICAL HISTORY: 54 year old female with right-sided weakness. FINDINGS: BRAIN AND VENTRICLES: Brain volume remains normal. Gray white differentiation is stable and within normal limits. Plain basal ganglia calcification incidentally noted. No suspicious intracranial vascular hyperdensity. No acute hemorrhage. No evidence of acute infarct. No hydrocephalus. No extra-axial collection. No mass effect or midline shift. ORBITS: No acute abnormality. SINUSES: Paranasal sinuses, tympanic cavities and mastoids are well aerated. SOFT TISSUES AND SKULL:  No acute soft tissue abnormality. No skull fracture. IMPRESSION: 1. Stable and normal for age non-contrast head CT. Electronically signed by: Helayne Hurst MD 09/02/2024 10:45 AM EST RP Workstation: [id number]     Procedures   Medications Ordered in the ED  sodium chloride  flush (NS) 0.9 % injection 3 mL ( Intravenous Canceled Entry 09/02/24 1051)  dexamethasone  (DECADRON ) injection 10 mg (has no administration in time range)  ketorolac  (TORADOL ) 15 MG/ML injection 15 mg (has no administration in time range)  metoCLOPramide  (REGLAN ) injection 5 mg (has no administration in time range)  diphenhydrAMINE  (BENADRYL ) injection 12.5 mg (has no administration in time range)                                    Medical Decision Making Amount and/or Complexity of Data Reviewed Labs: ordered. Radiology: ordered. ECG/medicine tests: ordered.  Risk Prescription drug management.   This patient  presents to the ED for concern of weakness, this involves an extensive number of treatment options, and is a complaint that carries with it a high risk of complications and morbidity.  The differential diagnosis includes CVA, cervical radiculopathy, lumbar radiculopathy, peripheral neuropathy, dissection, demyelinating    Co morbidities that complicate the patient evaluation  History of PE on anticoagulation Parsonage Turner syndrome   Additional history obtained:  Additional history obtained from patient was seen 12/29/2020 for similar symptoms and had stroke workup and after rule out diagnosis was diagnosed with Parsonage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.  Patient does follow with [facility] neurology for this.   Lab Tests:  I personally interpreted labs.  The pertinent results include:   CBC, CMP PT INR, CBG.  Hemoglobin is 8.6 stable from prior recent labs.    Imaging Studies ordered:  I ordered imaging studies including CT head I independently visualized and interpreted imaging which showed no acute  findings I agree with the radiologist interpretation Transferred to [facility] for MRI brain, cervical spine and lumbar spine   Cardiac Monitoring: / EKG:  The patient was maintained on a cardiac monitor.  I personally viewed and interpreted the cardiac monitored which showed an underlying rhythm of: Sinus   Problem List / ED Course / Critical interventions / Medication management  Patient presents emergency room with 3 days of right-sided weakness.  She reports a heaviness and weakness of her right arm and right leg.  She also reports severe right shoulder pain and right sided neck pain with this.  She reports that she gets daily headaches and currently has headache as well.  She has no meningeal signs on exam, no fever.  She is alert oriented and answering questions appropriately.  She is hemodynamically stable and well-appearing.  Given duration of symptoms she is not TNK candidate and she is currently LVO negative.  Symptoms are less consistent with symptomatic TIA.  Will obtain brain MRI to rule out CVA or other acute findings.  Also obtaining imaging of cervical spine and lumbar spine to evaluate for disc pathology, other acute findings.  Unfortunately MRI is not available at bedside and she will be transferred to [facility] for this. I ordered medication including migraine cocktail Reevaluation of the patient after these medicines showed that the patient improved I have reviewed the patients home medicines and have made adjustments as needed. [facility] to rule out CVA, also obtaining MRI cervical spine and lumbar spine. Will need reassessment/ pain control. Patient is established with neurology for outpatient follow up.         Final diagnoses:  Right arm weakness  Right leg weakness  Acute pain of right shoulder  Bad headache    ED Discharge Orders     None          Shermon Warren SAILOR, PA-C 09/02/24 1242  "

## 2024-09-02 NOTE — ED Notes (Signed)
 EDP in to see, at [facility], discussing transfer for MRI

## 2024-09-02 NOTE — ED Notes (Signed)
[facility].

## 2024-09-02 NOTE — ED Provider Notes (Signed)
 Patient sent from [facility] for MRI.  Please refer to previous note for full HPI.  In short patient has had right arm numbness and tingling intermittently for approximately 3 to 4 days.  Numbness and tingling exacerbates with movement of the arm and she describes it as a shooting pain down her arm.  She also endorses some fullness in her legs bilaterally.  No difficulties with ambulation or coordination.  Physical Exam  BP (!) 136/90 (BP Location: Left Wrist)   Pulse 68   Temp 98.2 F (36.8 C) (Oral)   Resp 18   SpO2 100%   Physical Exam Vitals and nursing note reviewed.  Constitutional:      General: She is not in acute distress.    Appearance: Normal appearance. She is not ill-appearing.  HENT:     Head: Normocephalic and atraumatic.     Nose: Nose normal.  Eyes:     Extraocular Movements: Extraocular movements intact.     Conjunctiva/sclera: Conjunctivae normal.     Pupils: Pupils are equal, round, and reactive to light.  Cardiovascular:     Rate and Rhythm: Normal rate.  Pulmonary:     Effort: Pulmonary effort is normal. No respiratory distress.  Musculoskeletal:        General: Normal range of motion.     Cervical back: Normal range of motion.  Neurological:     Mental Status: She is alert.     Comments: Strength is intact bilaterally.  Patient does not have any pain to palpation to the cervical spine.  Patient does have some reported shooting pain down the right arm exacerbated with range of motion.  Patient can actively move her right shoulder.  Sensation is fully intact throughout the arm.  No facial deficits and patient does not have any dysphagia or aphasia.  Psychiatric:        Mood and Affect: Mood normal.        Behavior: Behavior normal.     Procedures  Procedures  ED Course / MDM     MRI was overall unremarkable.  Incidental finding of thyroid  nodule that was discussed with patient at bedside for ultrasound follow-up.  Patient also was advised of small disc  bulging.  Patient was advised to follow-up with neurology for further recommendation and treatment.  Patient was given short course of pain management and advised to use Tylenol  and ibuprofen  as needed for pain.  Patient was able to actively move her right shoulder without assistance, did report pain and increased right sided tingling.  Patient was able to ambulate without assistance.  Neurology referral was placed and she agreed to follow-up.  Strict return precautions were discussed with patient at bedside.  Patient agrees with treatment plan and is comfortable with discharge at this time.    Myriam Fonda RAMAN, PA-C 09/03/24 0003    Pamella Ozell LABOR, DO 09/03/24 1515
